# Patient Record
Sex: Female | Born: 1940
Health system: Southern US, Community
[De-identification: ages and names within clinical notes are randomized; demographics above are authoritative.]

## PROBLEM LIST (undated history)

## (undated) DIAGNOSIS — G4730 Sleep apnea, unspecified: Secondary | ICD-10-CM

## (undated) DIAGNOSIS — M8580 Other specified disorders of bone density and structure, unspecified site: Secondary | ICD-10-CM

## (undated) DIAGNOSIS — M5481 Occipital neuralgia: Secondary | ICD-10-CM

## (undated) DIAGNOSIS — I10 Essential (primary) hypertension: Secondary | ICD-10-CM

## (undated) DIAGNOSIS — M545 Low back pain, unspecified: Secondary | ICD-10-CM

## (undated) DIAGNOSIS — M797 Fibromyalgia: Secondary | ICD-10-CM

## (undated) DIAGNOSIS — J189 Pneumonia, unspecified organism: Secondary | ICD-10-CM

## (undated) DIAGNOSIS — K219 Gastro-esophageal reflux disease without esophagitis: Secondary | ICD-10-CM

## (undated) DIAGNOSIS — C801 Malignant (primary) neoplasm, unspecified: Secondary | ICD-10-CM

## (undated) DIAGNOSIS — A159 Respiratory tuberculosis unspecified: Secondary | ICD-10-CM

## (undated) DIAGNOSIS — D649 Anemia, unspecified: Secondary | ICD-10-CM

## (undated) DIAGNOSIS — E871 Hypo-osmolality and hyponatremia: Secondary | ICD-10-CM

## (undated) DIAGNOSIS — M1990 Unspecified osteoarthritis, unspecified site: Secondary | ICD-10-CM

## (undated) DIAGNOSIS — C4492 Squamous cell carcinoma of skin, unspecified: Secondary | ICD-10-CM

## (undated) DIAGNOSIS — K649 Unspecified hemorrhoids: Secondary | ICD-10-CM

## (undated) DIAGNOSIS — R011 Cardiac murmur, unspecified: Secondary | ICD-10-CM

## (undated) DIAGNOSIS — N189 Chronic kidney disease, unspecified: Secondary | ICD-10-CM

## (undated) DIAGNOSIS — G250 Essential tremor: Secondary | ICD-10-CM

## (undated) DIAGNOSIS — C4491 Basal cell carcinoma of skin, unspecified: Secondary | ICD-10-CM

## (undated) DIAGNOSIS — IMO0001 Reserved for inherently not codable concepts without codable children: Secondary | ICD-10-CM

## (undated) DIAGNOSIS — E7800 Pure hypercholesterolemia, unspecified: Secondary | ICD-10-CM

## (undated) HISTORY — DX: Unspecified osteoarthritis, unspecified site: M19.90

## (undated) HISTORY — DX: Low back pain, unspecified: M54.50

## (undated) HISTORY — DX: Unspecified hemorrhoids: K64.9

## (undated) HISTORY — DX: Occipital neuralgia: M54.81

## (undated) HISTORY — DX: Hypo-osmolality and hyponatremia: E87.1

## (undated) HISTORY — DX: Essential tremor: G25.0

## (undated) HISTORY — DX: Other specified disorders of bone density and structure, unspecified site: M85.80

## (undated) HISTORY — DX: Low back pain: M54.5

## (undated) HISTORY — DX: Gastro-esophageal reflux disease without esophagitis: K21.9

## (undated) HISTORY — DX: Essential (primary) hypertension: I10

## (undated) HISTORY — PX: TUBAL LIGATION: SHX77

## (undated) HISTORY — DX: Chronic kidney disease, unspecified: N18.9

## (undated) HISTORY — DX: Pure hypercholesterolemia, unspecified: E78.00

---
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
## 1982-08-17 DIAGNOSIS — C4491 Basal cell carcinoma of skin, unspecified: Secondary | ICD-10-CM

## 1982-08-17 HISTORY — DX: Basal cell carcinoma of skin, unspecified: C44.91

## 2000-10-22 ENCOUNTER — Encounter: Admission: RE | Admit: 2000-10-22 | Discharge: 2000-10-22 | Payer: Self-pay | Admitting: Internal Medicine

## 2000-10-22 ENCOUNTER — Encounter: Payer: Self-pay | Admitting: Internal Medicine

## 2001-09-19 ENCOUNTER — Ambulatory Visit (HOSPITAL_COMMUNITY): Admission: RE | Admit: 2001-09-19 | Discharge: 2001-09-19 | Payer: Self-pay | Admitting: Gastroenterology

## 2002-07-06 ENCOUNTER — Encounter: Payer: Self-pay | Admitting: Emergency Medicine

## 2002-07-06 ENCOUNTER — Inpatient Hospital Stay (HOSPITAL_COMMUNITY): Admission: EM | Admit: 2002-07-06 | Discharge: 2002-07-07 | Payer: Self-pay | Admitting: Emergency Medicine

## 2002-07-07 ENCOUNTER — Encounter: Payer: Self-pay | Admitting: Internal Medicine

## 2002-07-15 DIAGNOSIS — C4492 Squamous cell carcinoma of skin, unspecified: Secondary | ICD-10-CM

## 2002-07-15 HISTORY — DX: Squamous cell carcinoma of skin, unspecified: C44.92

## 2003-01-13 ENCOUNTER — Encounter: Admission: RE | Admit: 2003-01-13 | Discharge: 2003-01-13 | Payer: Self-pay | Admitting: Internal Medicine

## 2003-01-13 ENCOUNTER — Encounter: Payer: Self-pay | Admitting: Internal Medicine

## 2003-10-30 ENCOUNTER — Other Ambulatory Visit: Admission: RE | Admit: 2003-10-30 | Discharge: 2003-10-30 | Payer: Self-pay | Admitting: Internal Medicine

## 2003-11-06 ENCOUNTER — Ambulatory Visit (HOSPITAL_COMMUNITY): Admission: RE | Admit: 2003-11-06 | Discharge: 2003-11-06 | Payer: Self-pay | Admitting: Internal Medicine

## 2003-11-09 ENCOUNTER — Encounter: Admission: RE | Admit: 2003-11-09 | Discharge: 2003-11-09 | Payer: Self-pay | Admitting: Internal Medicine

## 2003-12-01 ENCOUNTER — Ambulatory Visit (HOSPITAL_COMMUNITY): Admission: RE | Admit: 2003-12-01 | Discharge: 2003-12-01 | Payer: Self-pay | Admitting: Internal Medicine

## 2003-12-08 ENCOUNTER — Encounter: Admission: RE | Admit: 2003-12-08 | Discharge: 2003-12-08 | Payer: Self-pay | Admitting: Internal Medicine

## 2004-07-20 ENCOUNTER — Encounter: Admission: RE | Admit: 2004-07-20 | Discharge: 2004-07-20 | Payer: Self-pay | Admitting: Gastroenterology

## 2004-07-29 ENCOUNTER — Ambulatory Visit (HOSPITAL_COMMUNITY): Admission: RE | Admit: 2004-07-29 | Discharge: 2004-07-29 | Payer: Self-pay | Admitting: Neurology

## 2004-11-04 ENCOUNTER — Other Ambulatory Visit: Admission: RE | Admit: 2004-11-04 | Discharge: 2004-11-04 | Payer: Self-pay | Admitting: Internal Medicine

## 2005-01-06 ENCOUNTER — Encounter: Admission: RE | Admit: 2005-01-06 | Discharge: 2005-01-06 | Payer: Self-pay | Admitting: Internal Medicine

## 2005-07-27 ENCOUNTER — Encounter: Admission: RE | Admit: 2005-07-27 | Discharge: 2005-07-27 | Payer: Self-pay | Admitting: Orthopedic Surgery

## 2005-09-01 ENCOUNTER — Emergency Department (HOSPITAL_COMMUNITY): Admission: EM | Admit: 2005-09-01 | Discharge: 2005-09-01 | Payer: Self-pay | Admitting: Family Medicine

## 2006-01-03 ENCOUNTER — Other Ambulatory Visit: Admission: RE | Admit: 2006-01-03 | Discharge: 2006-01-03 | Payer: Self-pay | Admitting: Family Medicine

## 2006-01-09 ENCOUNTER — Encounter: Admission: RE | Admit: 2006-01-09 | Discharge: 2006-01-09 | Payer: Self-pay | Admitting: Family Medicine

## 2006-01-23 ENCOUNTER — Encounter: Admission: RE | Admit: 2006-01-23 | Discharge: 2006-01-23 | Payer: Self-pay | Admitting: Family Medicine

## 2006-03-15 ENCOUNTER — Emergency Department (HOSPITAL_COMMUNITY): Admission: EM | Admit: 2006-03-15 | Discharge: 2006-03-15 | Payer: Self-pay | Admitting: Emergency Medicine

## 2006-03-27 DIAGNOSIS — C4491 Basal cell carcinoma of skin, unspecified: Secondary | ICD-10-CM

## 2006-03-27 HISTORY — DX: Basal cell carcinoma of skin, unspecified: C44.91

## 2006-05-08 ENCOUNTER — Encounter: Admission: RE | Admit: 2006-05-08 | Discharge: 2006-06-06 | Payer: Self-pay | Admitting: Rheumatology

## 2006-05-28 ENCOUNTER — Encounter: Admission: RE | Admit: 2006-05-28 | Discharge: 2006-05-28 | Payer: Self-pay | Admitting: Family Medicine

## 2007-02-26 ENCOUNTER — Encounter: Admission: RE | Admit: 2007-02-26 | Discharge: 2007-02-26 | Payer: Self-pay | Admitting: Family Medicine

## 2007-05-10 ENCOUNTER — Other Ambulatory Visit: Admission: RE | Admit: 2007-05-10 | Discharge: 2007-05-10 | Payer: Self-pay | Admitting: Family Medicine

## 2007-08-14 ENCOUNTER — Encounter: Admission: RE | Admit: 2007-08-14 | Discharge: 2007-08-14 | Payer: Self-pay | Admitting: Family Medicine

## 2007-09-03 ENCOUNTER — Encounter: Admission: RE | Admit: 2007-09-03 | Discharge: 2007-09-03 | Payer: Self-pay | Admitting: Family Medicine

## 2008-02-10 ENCOUNTER — Encounter: Admission: RE | Admit: 2008-02-10 | Discharge: 2008-02-10 | Payer: Self-pay | Admitting: Family Medicine

## 2008-08-17 ENCOUNTER — Encounter: Admission: RE | Admit: 2008-08-17 | Discharge: 2008-08-17 | Payer: Self-pay | Admitting: Family Medicine

## 2008-09-24 ENCOUNTER — Encounter: Admission: RE | Admit: 2008-09-24 | Discharge: 2008-09-24 | Payer: Self-pay | Admitting: Family Medicine

## 2009-12-09 ENCOUNTER — Encounter: Admission: RE | Admit: 2009-12-09 | Discharge: 2009-12-09 | Payer: Self-pay | Admitting: Family Medicine

## 2010-03-16 ENCOUNTER — Other Ambulatory Visit
Admission: RE | Admit: 2010-03-16 | Discharge: 2010-03-16 | Payer: Self-pay | Source: Home / Self Care | Admitting: Family Medicine

## 2010-07-30 ENCOUNTER — Encounter: Payer: Self-pay | Admitting: Neurology

## 2010-07-31 ENCOUNTER — Encounter: Payer: Self-pay | Admitting: Family Medicine

## 2010-07-31 ENCOUNTER — Encounter: Payer: Self-pay | Admitting: Internal Medicine

## 2010-11-25 NOTE — Consult Note (Signed)
NAME:  [name], [name] NO.:  [id number]   MEDICAL RECORD NO.:  [id number]                   PATIENT TYPE:  INP   LOCATION:  1829                                 FACILITY:  [facility]   PHYSICIAN:  Lesleigh Noe, M.D.            DATE OF BIRTH:  1940/10/07   DATE OF CONSULTATION:  07/06/2002  DATE OF DISCHARGE:                                   CONSULTATION   CONCLUSIONS:  1. Chest discomfort with atypical features.  Possibly responsive to     sublingual nitroglycerin.  Rule out myocardial infarction.  2. History of hypertension.  3. History of hyperlipidemia.   RECOMMENDATIONS:  1. Serial enzymes and EKGs to rule out MI.  2. If enzymes and EKGs remain negative, stress Cardiolite July 07, 2002.     If rules in, we will need coronary angiography.  3. Aspirin.  4. Lovenox subcutaneously.  5. Continue other therapy as previously listed.   HISTORY OF PRESENT ILLNESS:  The patient is 70 and has been previously  evaluated for coronary disease by Dr. Meade Maw, having had a stress  Cardiolite approximately two and a half to three years ago that was without  major abnormality.  At approximately 12 midnight, July 06, 2002, the  patient developed discomfort in her chest that would radiate from the  subclavicular region, down her chest in spasmodic episodes.  This eventually  lead to a pressure-like sensation in the chest.  She took up to three of her  husband's nitroglycerin tablets and over approximately an hour and a half  the discomfort became much improved.   This morning, when she awakened, she did not feel well.  There was no chest  discomfort but she came to the walk-in clinic for evaluation and was sent to  the ER.  She is currently pain-free.  She has a history of hyperlipidemia  and hypertension.   MEDICATIONS:  Lisinopril, hydrochlorothiazide, Pravachol, and a coated  aspirin per day.   HABITS:  Does not smoke or drink.   FAMILY HISTORY:  There is a positive family history of coronary artery  disease with both mother and father before age 71.   PHYSICAL EXAMINATION:  GENERAL:  The patient is in no acute distress.  VITAL SIGNS:  Blood pressure is 148/90, heart rate 80.  NECK:  No carotid bruits are heard.  No JVD is noted.  LUNGS:  Clear to auscultation and percussion.  CARDIOVASCULAR:  No click, no gallop, and no rub.  ABDOMEN:  Soft.  EXTREMITIES:  No edema.   LABORATORY DATA:  EKG reveals normal sinus rhythm, permanent P-waves  throughout.  No acute ST or T-wave changes or ischemia/infarction.   The laboratory data reveals a CK-MB of 103/1.5 and troponin I of 0.01.  Lesleigh Noe, M.D.    HWS/MEDQ  D:  07/06/2002  T:  07/06/2002  Job:  [id number]   cc:   Marcene Duos, M.D.  [address] [place]  [place] [address]  Fax: [phone]   Meade Maw, M.D.  [address]  [place]  [place] [address]  Fax: [phone]   Thora Lance, M.D.  [address]  [place]  [place] [address]  Fax: [phone]

## 2010-11-25 NOTE — H&P (Signed)
NAME:  [name], [name] NO.:  [id number]   MEDICAL RECORD NO.:  [id number]                   PATIENT TYPE:  INP   LOCATION:  1829                                 FACILITY:  [facility]   PHYSICIAN:  Thora Lance, M.D.               DATE OF BIRTH:  September 13, 1940   DATE OF ADMISSION:  07/06/2002  DATE OF DISCHARGE:                                HISTORY & PHYSICAL   CHIEF COMPLAINT:  Chest pain.   HISTORY OF PRESENT ILLNESS:  This is a 70 year old white female who awoke  this morning at 2:00 a.m. with chest pain.  Initially it was sharp and then  developed into a 10/10 chest pressure, which radiated into the left arm and  left jaw.  There was no shortness of breath.  She eventually took 2 of her  husband's nitroglycerin, and the pain completely went away.  Since that time  she has only had a flicker of pain.  She has had no recent chest pain in  the last several weeks.  She did have chest pain 3 years ago and had a  nuclear stress test done by Dr. Fraser Din, that she was told was borderline.  She has not had significant problems with her heart since that time.   Cardiac risk factors include a family history of CAD in both parents,  hypertension, and hypercholesterolemia.   PAST MEDICAL HISTORY:  1. Hypertension.  2. Hyperlipidemia.   PAST SURGICAL HISTORY:  Bilateral tubal ligation.   ALLERGIES:  None.   CURRENT MEDICATIONS:  1. Lisinopril 5 mg one p.o. every day.  2. Hydrochlorothiazide 25 mg one p.o. every day.  3. Pravachol 40 mg one p.o. q.h.s.  4. Multivitamin every day.  5. Caltrate one a day.  6. Relafen 500 mg a day.   FAMILY HISTORY:  Mother with CHF in her 52s, diabetes, and leukemia.  Father  died at age 67, prostate cancer, had CAD in his late 58s.  Sister is okay.   SOCIAL HISTORY:  Married.  Two children, one living.  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  She does  not smoke.  Occasional  alcohol.   REVIEW OF SYSTEMS:  Negative.   PHYSICAL EXAMINATION:  GENERAL:  Well-appearing, white female.  VITAL SIGNS:  Blood pressure 134/67, heart rate 74, respirations 16,  afebrile, oxygen saturation 99% on room air.  HEENT:  Pupils are equal and reacts to light.  Extraocular muscles are  intact.  Ears show tympanic membranes are clear.  Oropharynx is clear.  NECK:  Supple.  No lymphadenopathy.  No bruits.  LUNGS:  Clear.  HEART:  Regular rate and rhythm without murmur, gallop, or rub.  ABDOMEN:  Soft, nontender.  No masses or hepatosplenomegaly.  _______ No  edema.  NEUROLOGIC:  Nonfocal.   LABORATORIES:  CBC:  WBC 4.7, hemoglobin 12.1, platelet  count 242.  Chemistry:  Sodium of 130, potassium 2.9, chloride 94, bicarb 27, glucose  102, BUN 9, creatinine 0.8, calcium 9.7, total protein 6.5, albumin 3.8,  SGOT 29, SGPT 19, alkaline phosphatase 79, total bilirubin 1.7.  Creatinine  kinase is 103, creatinine kinase MB 1.5.  Troponin I of 0.01.  Chest x-ray:  No acute disease.  EKG:  Normal sinus rhythm, normal EKG.   ASSESSMENT:  1. Chest pain, rule out unstable angina pectoris or myocardial infarction.     She has 3 cardiac risk factors.  2. Hypertension.  3. Hyperlipidemia.   PLAN:  Admit to telemetry.  Aspirin.  Lovenox.  If any further chest pain,  start nitroglycerin IV.  Cardiology consult for possible stress Cardiolite  study.                                               Thora Lance, M.D.    Delorse Limber  D:  07/06/2002  T:  07/06/2002  Job:  [id number]   cc:   Marcene Duos, M.D.  [address] [place]  [place] [address]  Fax: [phone]

## 2011-01-09 ENCOUNTER — Other Ambulatory Visit: Payer: Self-pay | Admitting: Dermatology

## 2011-05-03 ENCOUNTER — Other Ambulatory Visit: Payer: Self-pay | Admitting: Dermatology

## 2012-02-23 ENCOUNTER — Other Ambulatory Visit: Payer: Self-pay | Admitting: Family Medicine

## 2012-02-23 DIAGNOSIS — Z1231 Encounter for screening mammogram for malignant neoplasm of breast: Secondary | ICD-10-CM

## 2012-02-29 ENCOUNTER — Ambulatory Visit
Admission: RE | Admit: 2012-02-29 | Discharge: 2012-02-29 | Disposition: A | Discharge status: Home / Self Care | Payer: Medicare Other | Source: Ambulatory Visit | Attending: Family Medicine | Admitting: Family Medicine

## 2012-02-29 DIAGNOSIS — Z1231 Encounter for screening mammogram for malignant neoplasm of breast: Secondary | ICD-10-CM

## 2012-10-30 ENCOUNTER — Other Ambulatory Visit: Payer: Self-pay

## 2012-10-30 MED ORDER — PREGABALIN 75 MG PO CAPS: 75.0000 mg | ORAL_CAPSULE | Freq: Two times a day (BID) | ORAL | Status: DC

## 2012-10-30 NOTE — Telephone Encounter (Signed)
Former Dr [name] Patient, has not been assigned new provider.  Forwarding request to [facility].

## 2012-12-09 ENCOUNTER — Inpatient Hospital Stay (HOSPITAL_COMMUNITY): Admission: RE | Admit: 2012-12-09 | Payer: Medicare Other | Source: Ambulatory Visit | Admitting: Orthopedic Surgery

## 2012-12-09 ENCOUNTER — Encounter (HOSPITAL_COMMUNITY): Admission: RE | Payer: Self-pay | Source: Ambulatory Visit

## 2012-12-09 SURGERY — ARTHROPLASTY, KNEE, TOTAL
Anesthesia: Regional | Site: Knee | Laterality: Left

## 2013-04-03 ENCOUNTER — Emergency Department (HOSPITAL_COMMUNITY)
Admission: EM | Admit: 2013-04-03 | Discharge: 2013-04-03 | Disposition: A | Discharge status: Home / Self Care | Payer: Medicare Other | Attending: Emergency Medicine | Admitting: Emergency Medicine

## 2013-04-03 ENCOUNTER — Emergency Department (HOSPITAL_COMMUNITY): Payer: Medicare Other

## 2013-04-03 ENCOUNTER — Encounter (HOSPITAL_COMMUNITY): Payer: Self-pay | Admitting: *Deleted

## 2013-04-03 DIAGNOSIS — Y929 Unspecified place or not applicable: Secondary | ICD-10-CM | POA: Insufficient documentation

## 2013-04-03 DIAGNOSIS — Z79899 Other long term (current) drug therapy: Secondary | ICD-10-CM | POA: Insufficient documentation

## 2013-04-03 DIAGNOSIS — Y9301 Activity, walking, marching and hiking: Secondary | ICD-10-CM | POA: Insufficient documentation

## 2013-04-03 DIAGNOSIS — IMO0001 Reserved for inherently not codable concepts without codable children: Secondary | ICD-10-CM | POA: Insufficient documentation

## 2013-04-03 DIAGNOSIS — W19XXXA Unspecified fall, initial encounter: Secondary | ICD-10-CM

## 2013-04-03 DIAGNOSIS — S0003XA Contusion of scalp, initial encounter: Secondary | ICD-10-CM | POA: Insufficient documentation

## 2013-04-03 DIAGNOSIS — W1809XA Striking against other object with subsequent fall, initial encounter: Secondary | ICD-10-CM | POA: Insufficient documentation

## 2013-04-03 DIAGNOSIS — I10 Essential (primary) hypertension: Secondary | ICD-10-CM | POA: Insufficient documentation

## 2013-04-03 HISTORY — DX: Fibromyalgia: M79.7

## 2013-04-03 MED ORDER — IBUPROFEN 400 MG PO TABS
600.0000 mg | ORAL_TABLET | Freq: Once | ORAL | Status: DC
  Filled 2013-04-03 (×2): qty 1

## 2013-04-03 NOTE — ED Provider Notes (Addendum)
I saw and evaluated the patient, reviewed the resident's note and I agree with the findings and plan.  Patient seen by me. Patient was walking her dog and got pulled and fell backwards. Your head on the cement no loss of consciousness. Patient has small hematoma to the back of the head. Patient is not on any blood thinners. Patient will get head CT if negative she can be discharged home. Patient's neuro exam is normal mental status is normal.    Shelda Jakes, MD 04/03/13 2007  Shelda Jakes, MD 04/03/13 2007

## 2013-04-03 NOTE — ED Provider Notes (Signed)
CSN: [id number]     Arrival date & time 04/03/13  1855 History   First MD Initiated Contact with Patient 04/03/13 1930     Chief Complaint  Patient presents with  . Fall   (Consider location/radiation/quality/duration/timing/severity/associated sxs/prior Treatment) Patient is a 72 y.o. female presenting with fall. The history is provided by the patient. No language interpreter was used.  Fall This is a new problem. The current episode started today. Episode frequency: once. The problem has been resolved. Associated symptoms include headaches. Pertinent negatives include no abdominal pain, arthralgias, chest pain, fever, joint swelling, nausea, neck pain, numbness, vertigo, visual change, vomiting or weakness. Exacerbated by: palpation of hematoma. She has tried nothing for the symptoms.    Past Medical History  Diagnosis Date  . Hypertension   . Fibromyalgia    History reviewed. No pertinent past surgical history. History reviewed. No pertinent family history. History  Substance Use Topics  . Smoking status: Never Smoker   . Smokeless tobacco: Not on file  . Alcohol Use: Yes     Comment: 1 drink daily   OB History   Grav Para Term Preterm Abortions TAB SAB Ect Mult Living                 Review of Systems  Constitutional: Negative for fever.  HENT: Negative for neck pain.   Respiratory: Negative for shortness of breath.   Cardiovascular: Negative for chest pain.  Gastrointestinal: Negative for nausea, vomiting and abdominal pain.  Musculoskeletal: Negative for back pain, joint swelling, arthralgias and gait problem.  Skin: Positive for wound.  Neurological: Positive for headaches. Negative for dizziness, vertigo, seizures, syncope, speech difficulty, weakness and numbness.  All other systems reviewed and are negative.    Allergies  Lisinopril and Sulfa antibiotics  Home Medications   Current Outpatient Rx  Name  Route  Sig  Dispense  Refill  . amLODipine (NORVASC)  5 MG tablet   Oral   Take 5 mg by mouth daily.         Marland Kitchen atenolol (TENORMIN) 25 MG tablet   Oral   Take 25 mg by mouth daily.         . celecoxib (CELEBREX) 200 MG capsule   Oral   Take 200 mg by mouth 2 (two) times daily as needed for pain.         Marland Kitchen gabapentin (NEURONTIN) 100 MG capsule   Oral   Take 100 mg by mouth 3 (three) times daily as needed (pain).         Marland Kitchen lidocaine (LIDODERM) 5 %   Transdermal   Place 1 patch onto the skin daily as needed (pain). Remove & Discard patch within 12 hours or as directed by MD         . pregabalin (LYRICA) 75 MG capsule   Oral   Take 1 capsule (75 mg total) by mouth 2 (two) times daily.   60 capsule   5     Pharmacy Fax [phone]   . SALINE NA   Each Nare   Place 1 spray into both nostrils as needed (dryness).         . Tetrahydrozoline HCl (VISINE OP)   Both Eyes   Place 1 drop into both eyes as needed (dry eyes).         . traMADol (ULTRAM) 50 MG tablet   Oral   Take 100 mg by mouth 3 (three) times daily as needed for pain.  BP 143/59  Pulse 73  Temp(Src) 97.3 F (36.3 C) (Oral)  Resp 18  Ht 5\' 5"  (1.651 m)  Wt 150 lb (68.04 kg)  BMI 24.96 kg/m2  SpO2 98% Physical Exam  Vitals reviewed. Constitutional: She is oriented to person, place, and time. She appears well-developed and well-nourished.  HENT:  Head:    Mouth/Throat: Oropharynx is clear and moist.  Eyes: EOM are normal. Pupils are equal, round, and reactive to light.  Cardiovascular: Normal rate, regular rhythm, normal heart sounds and intact distal pulses.   Pulmonary/Chest: Effort normal and breath sounds normal.  Abdominal: Soft. Bowel sounds are normal. She exhibits no distension. There is no tenderness.  Musculoskeletal:       Cervical back: She exhibits no bony tenderness.       Thoracic back: She exhibits no bony tenderness.       Lumbar back: She exhibits no bony tenderness.  Neurological: She is alert and oriented to  person, place, and time. She has normal strength. No cranial nerve deficit or sensory deficit. Coordination and gait normal. GCS eye subscore is 4. GCS verbal subscore is 5. GCS motor subscore is 6.  Skin: Skin is warm and dry.    ED Course  Procedures (including critical care time) Labs Review Labs Reviewed - No data to display Imaging Review Ct Head Wo Contrast  04/03/2013   CLINICAL DATA:  Head injury status post fall.  EXAM: CT HEAD WITHOUT CONTRAST  TECHNIQUE: Contiguous axial images were obtained from the base of the skull through the vertex without intravenous contrast.  COMPARISON:  Head CT 03/15/2006.  FINDINGS: There is a large posterior scalp hematoma. There is no evidence of acute intracranial hemorrhage, mass lesion, brain edema or extra-axial fluid collection. The ventricles and subarachnoid spaces are appropriately sized for age. There is no CT evidence of acute cortical infarction. There is minimal periventricular white matter disease.  The visualized paranasal sinuses, mastoid air cells and middle ears are clear. The calvarium is intact.  IMPRESSION: Large posterior scalp soft tissue hematoma. No acute intracranial findings or evidence of calvarial fracture.   Electronically Signed   By: Roxy Horseman   On: 04/03/2013 20:46    MDM   1. Fall, initial encounter   2. Hematoma of scalp, initial encounter     72 y/o female with mechanical fall. Posterior scalp hematoma and abrasion. No neurological deficits on exam. CT head without intracranial hematoma/hemorrhage. Cervical spine cleared clinically by Nexus criteria. Appropriate for discharge with PCP follow up. Return precautions discussed (focal neurological deficits, worsening headache, vision changes).   Imaging reivewed in my medical decision making. Patient discussed with my attending, Dr. Nonda Lou, MD 04/04/13 [phone]

## 2013-04-03 NOTE — ED Notes (Signed)
Patient was walking her dog and the dog tugged away, the patient fell backwards and hit her head, small abrasion on her head with some swelling

## 2013-04-03 NOTE — ED Notes (Signed)
Pt reports walking her dog and falling backwards, hit back of head on cement, no loc. Has hematoma to back of head.

## 2013-05-05 ENCOUNTER — Other Ambulatory Visit: Payer: Self-pay

## 2013-05-05 DIAGNOSIS — Z1231 Encounter for screening mammogram for malignant neoplasm of breast: Secondary | ICD-10-CM

## 2013-05-27 ENCOUNTER — Ambulatory Visit
Admission: RE | Admit: 2013-05-27 | Discharge: 2013-05-27 | Disposition: A | Discharge status: Home / Self Care | Payer: Medicare Other | Source: Ambulatory Visit

## 2013-05-27 DIAGNOSIS — Z1231 Encounter for screening mammogram for malignant neoplasm of breast: Secondary | ICD-10-CM

## 2013-06-18 ENCOUNTER — Other Ambulatory Visit: Payer: Self-pay | Admitting: Family Medicine

## 2013-06-18 DIAGNOSIS — R928 Other abnormal and inconclusive findings on diagnostic imaging of breast: Secondary | ICD-10-CM

## 2013-06-25 ENCOUNTER — Ambulatory Visit
Admission: RE | Admit: 2013-06-25 | Discharge: 2013-06-25 | Disposition: A | Discharge status: Home / Self Care | Payer: Medicare Other | Source: Ambulatory Visit | Attending: Family Medicine | Admitting: Family Medicine

## 2013-06-25 DIAGNOSIS — R928 Other abnormal and inconclusive findings on diagnostic imaging of breast: Secondary | ICD-10-CM

## 2013-08-14 ENCOUNTER — Other Ambulatory Visit: Payer: Self-pay | Admitting: Interventional Cardiology

## 2013-09-08 ENCOUNTER — Encounter: Payer: Self-pay | Admitting: Interventional Cardiology

## 2013-10-14 ENCOUNTER — Ambulatory Visit: Payer: Medicare Other | Admitting: Interventional Cardiology

## 2013-10-20 ENCOUNTER — Encounter: Payer: Self-pay | Admitting: Podiatry

## 2013-10-20 ENCOUNTER — Ambulatory Visit (INDEPENDENT_AMBULATORY_CARE_PROVIDER_SITE_OTHER): Payer: Commercial Managed Care - HMO | Admitting: Podiatry

## 2013-10-20 DIAGNOSIS — M79609 Pain in unspecified limb: Secondary | ICD-10-CM

## 2013-10-20 NOTE — Progress Notes (Signed)
   Subjective:    Patient ID: [name], female    DOB: 1940-12-28, 73 y.o.   MRN: [id number]  HPI PT STATED RT OUTSIDE THE FOOT HAVE A KNOT AND IS BEEN SORE FOR OVER 10 YEARS. THE FOOT IS GETTING WORSE. THE FOOT GET AGGRAVATED WHEN PUTTING PRESSURE ON IT. TRIED TO SOAK IT WITH EPSON SALT AND DID NOT HELP AT ALL. ALSO CHECK B/L TOENAILS ARE CURVING IN.     DR. Amalia Hailey CANNOT ABLE TO SEE THE PT  WAS 50 MINUTES LATE AND RESCHEDULE THE APPOINTMENT.   Review of Systems  Musculoskeletal: Positive for joint swelling and myalgias.  Neurological: Positive for tremors.  Hematological: Bruises/bleeds easily.  All other systems reviewed and are negative.      Objective:   Physical Exam        Assessment & Plan:  Patient was not evaluated by myself today and was rescheduled because she was at least 50 minutes late for her scheduled appointment. She was offered the option of waiting or rescheduling.

## 2013-10-25 ENCOUNTER — Other Ambulatory Visit: Payer: Self-pay | Admitting: Interventional Cardiology

## 2013-12-16 ENCOUNTER — Encounter: Payer: Self-pay | Admitting: Cardiology

## 2013-12-16 ENCOUNTER — Encounter: Payer: Self-pay | Admitting: Interventional Cardiology

## 2013-12-16 ENCOUNTER — Ambulatory Visit (INDEPENDENT_AMBULATORY_CARE_PROVIDER_SITE_OTHER): Payer: Commercial Managed Care - HMO | Admitting: Interventional Cardiology

## 2013-12-16 VITALS — BP 122/66 | HR 59 | Ht 65.0 in | Wt 155.2 lb

## 2013-12-16 DIAGNOSIS — I10 Essential (primary) hypertension: Secondary | ICD-10-CM

## 2013-12-16 DIAGNOSIS — E7800 Pure hypercholesterolemia, unspecified: Secondary | ICD-10-CM

## 2013-12-16 DIAGNOSIS — R0602 Shortness of breath: Secondary | ICD-10-CM

## 2013-12-16 HISTORY — DX: Pure hypercholesterolemia, unspecified: E78.00

## 2013-12-16 NOTE — Patient Instructions (Signed)
Your physician has requested that you have en exercise stress myoview. For further information please visit [email or web address]. Please follow instruction sheet, as given.  Your physician recommends that you continue on your current medications as directed. Please refer to the Current Medication list given to you today.  Your physician wants you to follow-up in: 1 year with Dr. Varanasi. You will receive a reminder letter in the mail two months in advance. If you don't receive a letter, please call our office to schedule the follow-up appointment.  

## 2013-12-16 NOTE — Progress Notes (Signed)
Patient ID: [name], female   DOB: 04/29/1941, 73 y.o.   MRN: [id number]    [place], [place] [address], [place]  [address] Phone: [phone] Fax:  [phone]  Date:  12/16/2013   ID:  [name], [name] [birth year], MRN [id number]  PCP:  Osborne Casco, MD      History of Present Illness: [name] is a 73 y.o. female with HTN and HLD. BPs is well controlled at home.  No CP. She has some SHOB when she exerts herself. She is in a program at [facility] for patients with fibromyalgia. She does some exercises there and does some stretching exercises at home. She is not confident walking her dog. She feels arthritis has gotten worse.   She was unable to tolerate HCTZ due to hyponatremia. She switched her ACE-I to amlodipine and her sodium improved.   Reports SHOB,  diaphoresis of back of head with exertion along with sharp, brief, chest pain occasionally. Her feet also turned beet red. Has to stop activity to feel better. This has been going on for the past 6-7 mos,at least every other day. Also reports dizziness somemtimes. Still has LE swelling at night. Uses Lasix very sparingly Denies palpitations, orthopnea, PND.  Exercises 3 days a week.    Wt Readings from Last 3 Encounters:  12/16/13 155 lb 3.2 oz (70.398 kg)  04/03/13 150 lb (68.04 kg)     Past Medical History  Diagnosis Date  . Essential hypertension, benign   . Fibromyalgia   . Pure hypercholesterolemia 12/16/2013  . Osteoarthritis   . Low back pain     facet joint pain  . Benign essential tremor   . Hyponatremia     secondary to hctz-recurrent March 2010  . Hemorrhoids   . Osteopenia   . GERD (gastroesophageal reflux disease)   . Occipital neuralgia     Current Outpatient Prescriptions  Medication Sig Dispense Refill  . amLODipine (NORVASC) 5 MG tablet TAKE ONE TABLET BY MOUTH DAILY  90 tablet  0  . atenolol (TENORMIN) 25 MG tablet TAKE ONE TABLET BY MOUTH DAILY  90 tablet  0  . celecoxib  (CELEBREX) 200 MG capsule Take 200 mg by mouth every other day.       . furosemide (LASIX) 40 MG tablet Take 40 mg by mouth as needed.       . gabapentin (NEURONTIN) 100 MG capsule Take 100 mg by mouth 3 (three) times daily as needed (pain).      Marland Kitchen HYALGAN 20 MG/2ML SOSY       . lidocaine (LIDODERM) 5 % Place 1 patch onto the skin daily as needed (pain). Remove & Discard patch within 12 hours or as directed by MD      . pregabalin (LYRICA) 75 MG capsule Take 1 capsule (75 mg total) by mouth 2 (two) times daily.  60 capsule  5  . SALINE NA Place 1 spray into both nostrils as needed (dryness).      . Tetrahydrozoline HCl (VISINE OP) Place 1 drop into both eyes as needed (dry eyes).      . traMADol (ULTRAM) 50 MG tablet Take 100 mg by mouth 3 (three) times daily as needed for pain.      Marland Kitchen zolpidem (AMBIEN) 10 MG tablet Take 10 mg by mouth at bedtime.        No current facility-administered medications for this visit.    Allergies:    Allergies  Allergen  Reactions  . Hctz [Hydrochlorothiazide]     Hyponatremia   . Lisinopril Other (See Comments)    Sodium plumits  . Sulfa Antibiotics Rash    Social History:  The patient  reports that she has never smoked. She does not have any smokeless tobacco history on file. She reports that she drinks alcohol. She reports that she does not use illicit drugs.   Family History:  The patient's family history includes Heart disease in her father.   ROS:  Please see the history of present illness.  No nausea, vomiting.  No fevers, chills.  No focal weakness.  No dysuria.    All other systems reviewed and negative.   PHYSICAL EXAM: VS:  BP 122/66  Pulse 59  Ht 5\' 5"  (1.651 m)  Wt 155 lb 3.2 oz (70.398 kg)  BMI 25.83 kg/m2 Well nourished, well developed, in no acute distress HEENT: normal Neck: no JVD, no carotid bruits Cardiac:  normal S1, S2; RRR;  Lungs:  clear to auscultation bilaterally, no wheezing, rhonchi or rales Abd: soft, nontender, no  hepatomegaly Ext: no edema Skin: warm and dry Neuro:   no focal abnormalities noted  EKG:  Sinus bradycardia, no ST segment changes     ASSESSMENT AND PLAN:  Pre-operative cardiovascular examination  IMAGING: EKG    Harward,Amy 10/14/2012 11:02:32 AM > Lyssa Hackley,JAY 10/14/2012 11:22:43 AM > normal   Notes: Needs knee replacement.  Plan stress test.  She can try the treadmill but will likely need a Lexus scan study if she cannot get her heart rate up. We'll also check this given her recent shortness of breath and diaphoresis that she feels is out of proportion to her degree of exercise.    2. Essential hypertension, benign  Increase Amlodipine Besylate Tablet, 5 MG, TAKE ONE TABLET BY MOUTH DAILY, 90 days, 90, Refills 3 Refill Atenolol Tablet, 25 MG, 1 tablet, Orally, Once a day, 90 days, 90, Refills 3  now better controlled.    3. Ankle edema   Lasix as needed    4. Pure hypercholesterolemia  Start Simvastatin Tablet, 40 MG, 1 tablet every evening, Orally, Once a day Notes: She had stopped this due to joint pains. No change in 6 months since being off of the simvastatin.  I would recommend that she restart the simvastatin. LDL was controlled in 2014. She expects to have a higher reading this year since she has been off of the statin. She is willing to restart the statin.         Signed, Mina Marble, MD, [facility] 12/16/2013 12:15 PM

## 2013-12-26 ENCOUNTER — Telehealth: Payer: Self-pay | Admitting: Interventional Cardiology

## 2013-12-26 NOTE — Telephone Encounter (Signed)
Maximum HR will be 220-74 = 146. Target HR will be 124. These numbers are for exercise stress only, Not Lexiscan I think she will do fine with stress testing

## 2013-12-26 NOTE — Telephone Encounter (Signed)
I spoke with pt & she is aware Horton Chin RN

## 2013-12-26 NOTE — Telephone Encounter (Signed)
New Message:  Pt is requesting a call back in reference to her stress test. States she has several questions. Wants to speak with a nurse

## 2013-12-26 NOTE — Telephone Encounter (Signed)
Pt calling to make sure her stress test is here at the [facility] office & not as the automated reminder phoned her & said to report to [facility]. Office.  Pt is aware she will come to the [facility] on 12/30/13 for her stress test. Horton Chin RN

## 2013-12-26 NOTE — Telephone Encounter (Signed)
I spoke with pt again & she is very concerned that there will not be a doctor in the room while she is undergoing her stress test. She is fully aware we have cardiologists in the office while she is having this test. Have offered her a later date appt when Dr. Irish Lack is present in the office. She has declined at this time   I have given her information from her previous stress test in 2009 & have tried to reassure pt.  Pt would like to know from Dr. Irish Lack what her target heart rate will be? & what would be an unsafe heart rate. pt would like [place]  call back with this information   Forwarded to Dr. Lenon Curt RN

## 2013-12-30 ENCOUNTER — Ambulatory Visit (HOSPITAL_COMMUNITY): Payer: Medicare HMO | Attending: Cardiology | Admitting: Radiology

## 2013-12-30 VITALS — BP 151/81 | HR 66 | Ht 65.0 in | Wt 153.0 lb

## 2013-12-30 DIAGNOSIS — R079 Chest pain, unspecified: Secondary | ICD-10-CM

## 2013-12-30 DIAGNOSIS — I10 Essential (primary) hypertension: Secondary | ICD-10-CM | POA: Insufficient documentation

## 2013-12-30 DIAGNOSIS — Z8249 Family history of ischemic heart disease and other diseases of the circulatory system: Secondary | ICD-10-CM | POA: Insufficient documentation

## 2013-12-30 DIAGNOSIS — R0989 Other specified symptoms and signs involving the circulatory and respiratory systems: Secondary | ICD-10-CM | POA: Insufficient documentation

## 2013-12-30 DIAGNOSIS — R42 Dizziness and giddiness: Secondary | ICD-10-CM | POA: Insufficient documentation

## 2013-12-30 DIAGNOSIS — R0602 Shortness of breath: Secondary | ICD-10-CM

## 2013-12-30 DIAGNOSIS — R0789 Other chest pain: Secondary | ICD-10-CM | POA: Insufficient documentation

## 2013-12-30 DIAGNOSIS — R0609 Other forms of dyspnea: Secondary | ICD-10-CM | POA: Insufficient documentation

## 2013-12-30 MED ORDER — TECHNETIUM TC 99M SESTAMIBI GENERIC - CARDIOLITE
10.0000 | Freq: Once | INTRAVENOUS | Status: AC | PRN
  Administered 2013-12-30: 10 via INTRAVENOUS

## 2013-12-30 MED ORDER — TECHNETIUM TC 99M SESTAMIBI GENERIC - CARDIOLITE
30.0000 | Freq: Once | INTRAVENOUS | Status: AC | PRN
  Administered 2013-12-30: 30 via INTRAVENOUS

## 2013-12-30 NOTE — Progress Notes (Signed)
[facility] [address] [place], [place] [address] [phone]    Cardiology Nuclear Med Study  [name] is a 73 y.o. female     MRN : [id number]     DOB: [birth year]  Procedure Date: 12/30/2013  Nuclear Med Background Indication for Stress Test:  Evaluation for Ischemia and Surgical Clearance: Possible knee surgery History:  No known CAD, Echo 2011 EF 60-65%, MPI 2009 (normal) EF 80% Cardiac Risk Factors: Family History - CAD, Hypertension and Lipids  Symptoms:  Chest Pain (last date of chest discomfort was last week), Dizziness and DOE   Nuclear Pre-Procedure Caffeine/Decaff Intake:  7:00pm NPO After: 7:00pm   Lungs:  clear O2 Sat: 98% on room air. IV 0.9% NS with Angio Cath:  22g  IV Site: R Hand  IV Started by:  Matilde Haymaker, RN  Chest Size (in):  36 Cup Size: C  Height: 5\' 5"  (1.651 m)  Weight:  153 lb (69.4 kg)  BMI:  Body mass index is 25.46 kg/(m^2). Tech Comments:  No Atenolol x 36 hrs    Nuclear Med Study 1 or 2 day study: 1 day  Stress Test Type:  Stress  Reading MD: n/a  Order Authorizing Provider:  Rowan Blase  Resting Radionuclide: Technetium 79m Sestamibi  Resting Radionuclide Dose: 10.8 mCi   Stress Radionuclide:  Technetium 93m Sestamibi  Stress Radionuclide Dose: 33.0 mCi           Stress Protocol Rest HR: 66 Stress HR: 141  Rest BP: 151/81 Stress BP: 161/37  Exercise Time (min): 3:30 METS: 4.6           Dose of Adenosine (mg):  n/a Dose of Lexiscan: n/a mg  Dose of Atropine (mg): n/a Dose of Dobutamine: n/a mcg/kg/min (at max HR)  Stress Test Technologist: Glade Lloyd, BS-ES  Nuclear Technologist:  Charlton Amor, CNMT     Rest Procedure:  Myocardial perfusion imaging was performed at rest 45 minutes following the intravenous administration of Technetium 58m Sestamibi. Rest ECG: NSR - Normal EKG  Stress Procedure:  The patient exercised on the treadmill utilizing the Bruce Protocol for 3:30  minutes. The patient stopped due to being very SOB and denied any chest pain.  Technetium 17m Sestamibi was injected at peak exercise and myocardial perfusion imaging was performed after a brief delay. Stress ECG: No significant change from baseline ECG  QPS Raw Data Images:  Normal; no motion artifact; normal heart/lung ratio. Stress Images:  Normal homogeneous uptake in all areas of the myocardium. Rest Images:  Normal homogeneous uptake in all areas of the myocardium. Subtraction (SDS):  Normal Transient Ischemic Dilatation (Normal <1.22):  1.07 Lung/Heart Ratio (Normal <0.45):  0.31  Quantitative Gated Spect Images QGS EDV:  49 ml QGS ESV:  11 ml  Impression Exercise Capacity:  Poor exercise capacity. BP Response:  Normal blood pressure response. Clinical Symptoms:  There is dyspnea. ECG Impression:  No significant ST segment change suggestive of ischemia. Comparison with Prior Nuclear Study: No images to compare  Overall Impression:  Normal stress nuclear study. Note patient achieved less than 5 METS with exercise   LV Ejection Fraction: 77%.  LV Wall Motion:  NL LV Function; NL Wall Motion   Jenkins Rouge

## 2014-01-05 ENCOUNTER — Other Ambulatory Visit: Payer: Self-pay | Admitting: Dermatology

## 2014-04-02 ENCOUNTER — Other Ambulatory Visit: Payer: Self-pay | Admitting: Interventional Cardiology

## 2014-06-22 ENCOUNTER — Ambulatory Visit
Admission: RE | Admit: 2014-06-22 | Discharge: 2014-06-22 | Disposition: A | Discharge status: Home / Self Care | Payer: Commercial Managed Care - HMO | Source: Ambulatory Visit | Attending: Family Medicine | Admitting: Family Medicine

## 2014-06-22 ENCOUNTER — Other Ambulatory Visit: Payer: Self-pay | Admitting: Family Medicine

## 2014-06-22 DIAGNOSIS — R0989 Other specified symptoms and signs involving the circulatory and respiratory systems: Secondary | ICD-10-CM

## 2014-07-17 ENCOUNTER — Telehealth: Payer: Self-pay

## 2014-07-17 NOTE — Telephone Encounter (Signed)
Not sure. I would check with the patient and their pharmacy.

## 2014-07-20 ENCOUNTER — Other Ambulatory Visit: Payer: Self-pay

## 2014-07-20 MED ORDER — SIMVASTATIN 40 MG PO TABS: 40.0000 mg | ORAL_TABLET | Freq: Every day | ORAL | Status: DC

## 2014-07-20 NOTE — Telephone Encounter (Signed)
LMTCO.

## 2014-09-24 ENCOUNTER — Other Ambulatory Visit: Payer: Self-pay

## 2014-09-24 DIAGNOSIS — Z1239 Encounter for other screening for malignant neoplasm of breast: Secondary | ICD-10-CM

## 2014-09-30 ENCOUNTER — Other Ambulatory Visit: Payer: Self-pay | Admitting: Dermatology

## 2014-10-06 ENCOUNTER — Ambulatory Visit
Admission: RE | Admit: 2014-10-06 | Discharge: 2014-10-06 | Disposition: A | Discharge status: Home / Self Care | Payer: PPO | Source: Ambulatory Visit

## 2014-10-06 DIAGNOSIS — Z1239 Encounter for other screening for malignant neoplasm of breast: Secondary | ICD-10-CM

## 2014-10-29 ENCOUNTER — Other Ambulatory Visit: Payer: Self-pay | Admitting: Physician Assistant

## 2014-11-06 ENCOUNTER — Other Ambulatory Visit: Payer: Self-pay | Admitting: Interventional Cardiology

## 2015-01-01 ENCOUNTER — Encounter: Payer: Self-pay | Admitting: Neurology

## 2015-01-01 ENCOUNTER — Ambulatory Visit (INDEPENDENT_AMBULATORY_CARE_PROVIDER_SITE_OTHER): Payer: PPO | Admitting: Neurology

## 2015-01-01 VITALS — BP 110/58 | HR 72 | Resp 16 | Ht 65.0 in | Wt 150.0 lb

## 2015-01-01 DIAGNOSIS — G250 Essential tremor: Secondary | ICD-10-CM | POA: Diagnosis not present

## 2015-01-01 NOTE — Progress Notes (Signed)
Subjective:    Krause ID: [name] is a 74 y.o. female.  HPI      Interim history:   [name] is a 74 year old right-handed woman with an underlying medical history of fibromyalgia, hypertension, hyperlipidemia, atrial fibrillation, and overweight state, who presents for follow-up consultation of [name] tremor. [name] is unaccompanied today. This is [name] first visit with me and [name] previously saw Dr. Morene Antu. [name] was seen by him in December 2013 and I reviewed his office note from 06/17/2012: He reported that [name] has a history of low back pain and mild degenerative joint disease at L5-S1. He started seeing [name] in 2006 for right-sided mid face pain, suspected trigeminal neuralgia with normal exam and normal brain MRI and MRA in 2007. In 2009, he suspected [name] had fibromyalgia and tried [name] on Savella. [name] had SE, including difficulty sleeping at night. [name] has a history of chronic low sodium according to his note. [name] had noted difficulty with [name] handwriting and hand tremors. In Briana past, [name] has been on amitriptyline, Cymbalta and Savella for fibromyalgia. [name] mother had tremors. Overall, he felt that [name] was doing well. He suspected that [name] could have occipital neuralgia. [name] was on Lyrica and gabapentin at Briana time.  Today, 01/01/2015: [name] reports that [name] tremors have gradually become worse. [name] feels it more on [name] right side. [name] feels at times embarrassed to eat in public. [name] currently takes Lyrica 75 mg in Briana morning and 75 mg at midday and 100 mg of gabapentin at night. [name] does is primarily because Lyrica is too expensive even in Briana generic form. [name] takes tramadol 50 mg strength one in Briana morning, 2 at 3 PM and one or 2 at 7 or 8 PM. [name] also takes Lidoderm patch as needed. [name] is on amlodipine and Tenormin. [name] takes Zocor daily. [name] has to take Ambien 10 mg each night. [name] says [name] does not sleep without it. [name] is a nonsmoker. [name] does not drink caffeine. [name] drinks  alcohol daily, 2 cocktails each night. [name] tried to taper off of Lyrica in Briana past. [name] felt [name] fibromyalgia became worse. [name] has not tried Mysoline for tremors. [name] sister does not have a tremor.  [name] Past Medical History Is Significant For: Past Medical History  Diagnosis Date  . Essential hypertension, benign   . Fibromyalgia   . Pure hypercholesterolemia 12/16/2013  . Osteoarthritis   . Low back pain     facet joint pain  . Benign essential tremor   . Hyponatremia     secondary to hctz-recurrent March 2010  . Hemorrhoids   . Osteopenia   . GERD (gastroesophageal reflux disease)   . Occipital neuralgia   . Chronic kidney disease     [name] Past Surgical History Is Significant For: Past Surgical History  Procedure Laterality Date  . Tubal ligation      [name] Family History Is Significant For: Family History  Problem Relation Age of Onset  . Heart disease Father     [name] Social History Is Significant For: History   Social History  . Marital Status: Divorced    Spouse Name: N/A  . Number of Children: 2  . Years of Education: Masters   Occupational History  . Retired    Social History Main Topics  . Smoking status: Never Smoker   . Smokeless tobacco: Not on file  . Alcohol Use: 0.0 oz/week    0 Standard drinks or equivalent  per week     Comment: 1 drink daily  . Drug Use: No  . Sexual Activity: Not on file   Other Topics Concern  . None   Social History Narrative   Denies caffeine use     [name] Allergies Are:  Allergies  Allergen Reactions  . Hctz [Hydrochlorothiazide]     Hyponatremia   . Lisinopril Other (See Comments)    Sodium plumits  . Sulfa Antibiotics Rash  :   [name] Current Medications Are:  Outpatient Encounter Prescriptions as of 01/01/2015  Medication Sig  . amLODipine (NORVASC) 5 MG tablet TAKE ONE TABLET BY MOUTH DAILY  . atenolol (TENORMIN) 25 MG tablet TAKE ONE TABLET BY MOUTH DAILY  . gabapentin (NEURONTIN) 100 MG capsule Take 100 mg  by mouth 1 day or 1 dose.   . lidocaine (LIDODERM) 5 % Place 1 patch onto Briana skin daily as needed (pain). Remove & Discard patch within 12 hours or as directed by MD  . pregabalin (LYRICA) 75 MG capsule Take 1 capsule (75 mg total) by mouth 2 (two) times daily.  Marland Kitchen SALINE NA Place 1 spray into both nostrils as needed (dryness).  . simvastatin (ZOCOR) 40 MG tablet Take 1 tablet (40 mg total) by mouth at bedtime.  . Tetrahydrozoline HCl (VISINE OP) Place 1 drop into both eyes as needed (dry eyes).  . traMADol (ULTRAM) 50 MG tablet Take 100 mg by mouth 3 (three) times daily as needed for pain.  Marland Kitchen zolpidem (AMBIEN) 10 MG tablet Take 10 mg by mouth at bedtime.   Marland Kitchen HYALGAN 20 MG/2ML SOSY   . [DISCONTINUED] celecoxib (CELEBREX) 200 MG capsule Take 200 mg by mouth every other day.   . [DISCONTINUED] furosemide (LASIX) 40 MG tablet Take 40 mg by mouth as needed.    No facility-administered encounter medications on file as of 01/01/2015.  :  Review of Systems:  Out of a complete 14 point review of systems, all are reviewed and negative with Briana exception of these symptoms as listed below:  Review of Systems  Neurological: Positive for tremors and numbness.       Increased R foot numbness. Tremors reported in R hand mostly, sometimes in L hand. Krause states that [name] tremors are worse, affects [name] eating.     Objective:  Neurologic Exam  Physical Exam  Physical Examination:   Filed Vitals:   01/01/15 1124  BP: 110/58  Pulse: 72  Resp: 16   General Examination: [name] is a very pleasant 74 y.o. female in no acute distress. [name] appears well-developed and well-nourished and well groomed.   HEENT: Normocephalic, atraumatic, pupils are equal, round and reactive to light and accommodation. Funduscopic exam is normal with sharp disc margins noted. Extraocular tracking is good without limitation to gaze excursion or nystagmus noted. Normal smooth pursuit is noted. Hearing is grossly intact.  Tympanic membranes are clear bilaterally. Face is symmetric with normal facial animation and normal facial sensation. Speech is clear with no dysarthria noted. There is no hypophonia. There is no lip, neck/head, jaw tremor, but [name] has a slight degree of voice tremor. Neck is supple with full range of passive and active motion. There are no carotid bruits on auscultation. Oropharynx exam reveals: mild mouth dryness, adequate dental hygiene and no significant airway crowding.   Chest: Clear to auscultation without wheezing, rhonchi or crackles noted.  Heart: S1+S2+0, regular and normal without murmurs, rubs or gallops noted.   Abdomen: Soft, non-tender and non-distended with  normal bowel sounds appreciated on auscultation.  Extremities: There is no pitting edema in Briana distal lower extremities bilaterally. Pedal pulses are intact.  Skin: Warm and dry without trophic changes noted. There are no varicose veins.  Musculoskeletal: exam reveals no obvious joint deformities, tenderness or joint swelling or erythema.   Neurologically:  Mental status: [name] is awake, alert and oriented in all 4 spheres. [name] immediate and remote memory, attention, language skills and fund of knowledge are appropriate. There is no evidence of aphasia, agnosia, apraxia or anomia. Speech is has normal prosody and enunciation. Thought process is linear. Mood is normal and affect is normal.  Cranial nerves II - XII are as described above under HEENT exam. In addition: shoulder shrug is normal with equal shoulder height noted. Motor exam: Normal bulk, strength and tone is noted. There is no drift, or rebound. There is no resting tremor. There is a bilateral upper extremity postural and action tremor, which is mild in degree. There tremor frequency is fairly fast and Briana amplitude is small. On Archimedes spiral drawing there is mild tremulousness noted. Handwriting is mildly tremulous, but legible. There is no evidence of  micrographia.  Romberg is negative. Reflexes are 2+ throughout. Babinski: Toes are flexor bilaterally. Fine motor skills and coordination: intact with normal finger taps, normal hand movements, normal rapid alternating patting, normal foot taps and normal foot agility.  Cerebellar testing: No dysmetria or intention tremor on finger to nose testing. Heel to shin is unremarkable bilaterally. There is no truncal or gait ataxia.  Sensory exam: intact to light touch, pinprick, vibration, temperature sense in Briana upper and lower extremities with mild decrease in pinprick and vibration sense in Briana right foot.  Gait, station and balance: [name] stands easily. No veering to one side is noted. No leaning to one side is noted. Posture is age-appropriate and stance is narrow based. Gait shows normal stride length and normal pace. No problems turning are noted. [name] turns en bloc. Tandem walk is unremarkable.    Assessment and Plan:  In summary, [name] is a very pleasant 74 y.o.-year old female with an underlying medical history of fibromyalgia, hypertension, hyperlipidemia, atrial fibrillation, and overweight state, who presents for follow-up consultation of [name] tremor. On examination, [name] has evidence of essential tremor. Findings are generally speaking on Briana milder to perhaps borderline moderate side. [name] is currently already on a beta blocker and has been taking Lyrica and gabapentin, primarily for [name] fibromyalgia. [name] is advised regarding essential tremor, its prognosis and treatment options. I would be reluctant to try Mysoline because of potential side effects and [name] may have to come off [name] Ambien to be able to try Mysoline because it can be quite sedating. [name] is not willing to stop Briana Ambien at this time. Symptomatic treatment for essential tremor can be quite limited in terms of medication options. I explained this to [name]. I also reassured [name] that [name] does not have any evidence of parkinsonism  at this time. At this juncture, [name] is encouraged to follow-up with [name] primary care physician as well as [name] rheumatologist for [name] fibromyalgia. I answered all [name] questions today and [name] was in agreement.   I spent 25 minutes in total face-to-face time with [name], more than 50% of which was spent in counseling and coordination of care, reviewing test results, reviewing medication and discussing or reviewing Briana diagnosis of ET, its prognosis and treatment options.

## 2015-01-01 NOTE — Patient Instructions (Signed)
  Please remember, that any kind of tremor may be exacerbated by anxiety, anger, nervousness, excitement, dehydration, sleep deprivation, by caffeine, and low blood sugar values or blood sugar fluctuations. Some medications, especially some antidepressants and lithium can cause or exacerbate tremors. Tremors may temporarily calm down her subside with the use of a benzodiazepine such as Valium or related medications and with alcohol. Be aware however that drinking alcohol is not an approved treatment or appropriate treatment for tremor control and long-term use of benzodiazepines such as Valium, lorazepam, alprazolam, or clonazepam can cause habit formation, physical and psychological addiction.

## 2015-02-09 ENCOUNTER — Other Ambulatory Visit: Payer: Self-pay | Admitting: Interventional Cardiology

## 2015-03-09 ENCOUNTER — Encounter: Payer: Self-pay | Admitting: Interventional Cardiology

## 2015-03-09 ENCOUNTER — Ambulatory Visit (INDEPENDENT_AMBULATORY_CARE_PROVIDER_SITE_OTHER): Payer: PPO | Admitting: Interventional Cardiology

## 2015-03-09 VITALS — BP 116/68 | HR 63 | Ht 65.0 in | Wt 158.0 lb

## 2015-03-09 DIAGNOSIS — E7800 Pure hypercholesterolemia, unspecified: Secondary | ICD-10-CM

## 2015-03-09 DIAGNOSIS — I10 Essential (primary) hypertension: Secondary | ICD-10-CM | POA: Diagnosis not present

## 2015-03-09 NOTE — Progress Notes (Signed)
Patient ID: [name], female   DOB: [birth year], 74 y.o.   MRN: [id number]     Cardiology Office Note   Date:  03/09/2015   ID:  [name], [name] 14, 1942, MRN [id number]  PCP:  Osborne Casco, MD    No chief complaint on file.  hypertension   Wt Readings from Last 3 Encounters:  03/09/15 158 lb (71.668 kg)  01/01/15 150 lb (68.04 kg)  12/30/13 153 lb (69.4 kg)       History of Present Illness: [name] is a 74 y.o. female  with HTN and HLD. BPs is well controlled at home.  No CP. She has some SHOB when she exerts herself. She is in a program at [facility] for patients with fibromyalgia. She does some exercises there and does some stretching exercises at home.  She feels arthritis has gotten worse. She continues to avoid knee replacement.  She was unable to tolerate HCTZ due to hyponatremia in the past. She switched her ACE-I to amlodipine and her sodium improved.   Denies palpitations, orthopnea, PND.  Overall, she feels she is doing well from a cardiovascular standpoint.    Past Medical History  Diagnosis Date  . Essential hypertension, benign   . Fibromyalgia   . Pure hypercholesterolemia 12/16/2013  . Osteoarthritis   . Low back pain     facet joint pain  . Benign essential tremor   . Hyponatremia     secondary to hctz-recurrent March 2010  . Hemorrhoids   . Osteopenia   . GERD (gastroesophageal reflux disease)   . Occipital neuralgia   . Chronic kidney disease     Past Surgical History  Procedure Laterality Date  . Tubal ligation       Current Outpatient Prescriptions  Medication Sig Dispense Refill  . amLODipine (NORVASC) 5 MG tablet TAKE ONE TABLET BY MOUTH DAILY 90 tablet 0  . atenolol (TENORMIN) 25 MG tablet TAKE ONE TABLET BY MOUTH DAILY 90 tablet 3  . gabapentin (NEURONTIN) 100 MG capsule Take 100 mg by mouth 1 day or 1 dose.     Marland Kitchen HYALGAN 20 MG/2ML SOSY     . lidocaine (LIDODERM) 5 % Place 1 patch onto the skin daily as needed (pain).  Remove & Discard patch within 12 hours or as directed by MD    . pregabalin (LYRICA) 75 MG capsule Take 1 capsule (75 mg total) by mouth 2 (two) times daily. 60 capsule 5  . SALINE NA Place 1 spray into both nostrils as needed (dryness).    . simvastatin (ZOCOR) 40 MG tablet Take 1 tablet (40 mg total) by mouth at bedtime. 90 tablet 3  . Tetrahydrozoline HCl (VISINE OP) Place 1 drop into both eyes as needed (dry eyes).    . traMADol (ULTRAM) 50 MG tablet Take 100 mg by mouth 3 (three) times daily as needed for pain.    Marland Kitchen zolpidem (AMBIEN) 10 MG tablet Take 10 mg by mouth at bedtime.      No current facility-administered medications for this visit.    Allergies:   Hctz; Lisinopril; and Sulfa antibiotics    Social History:  The patient  reports that she has never smoked. She has never used smokeless tobacco. She reports that she drinks alcohol. She reports that she does not use illicit drugs.   Family History:  The patient's family history includes Cancer in her father; Cancer - Ovarian in her mother; Heart disease in her father and mother; Stroke  in her sister.    ROS:  Please see the history of present illness.   Otherwise, review of systems are positive for knee pain.   All other systems are reviewed and negative.    PHYSICAL EXAM: VS:  BP 116/68 mmHg  Pulse 63  Ht 5\' 5"  (1.651 m)  Wt 158 lb (71.668 kg)  BMI 26.29 kg/m2 , BMI Body mass index is 26.29 kg/(m^2). GEN: Well nourished, well developed, in no acute distress HEENT: normal Neck: no JVD, carotid bruits, or masses Cardiac: RRR; no murmurs, rubs, or gallops,no edema  Respiratory:  clear to auscultation bilaterally, normal work of breathing GI: soft, nontender, nondistended, + BS MS: no deformity or atrophy Skin: warm and dry, no rash Neuro:  Strength and sensation are intact Psych: euthymic mood, full affect   EKG:   The ekg ordered today demonstrates normal   Recent Labs: No results found for requested labs within  last 365 days.   Lipid Panel No results found for: CHOL, TRIG, HDL, CHOLHDL, VLDL, LDLCALC, LDLDIRECT   Other studies Reviewed: Additional studies/ records that were reviewed today with results demonstrating: Stress test from 2015 reviewed and was normal.   ASSESSMENT AND PLAN:  1. HTN: Well controlled.  Continue current medications. 2. Hyperlipidemia: We'll recheck blood test today. Adjust statin as needed. 3. Cardiology Follow-up as needed . 4. She follows with nephrology for chronic renal insufficiency   Current medicines are reviewed at length with the patient today.  The patient concerns regarding her medicines were addressed.  The following changes have been made:  No change  Labs/ tests ordered today include:   Orders Placed This Encounter  Procedures  . Lipid Profile  . Hepatic function panel  . EKG 12-Lead    Recommend 150 minutes/week of aerobic exercise Low fat, low carb, high fiber diet recommended  Disposition:   FU in when necessary   Teresita Madura., MD  03/09/2015 11:36 AM    [facility] [place], [place], [place]  [address] Phone: [phone]; Fax: [phone]

## 2015-03-09 NOTE — Patient Instructions (Signed)
**Note De-Identified  Obfuscation** Medication Instructions:  Same-no change  Labwork: Sept. 6, 2016 (Lipids and LFT's). Please do not eat or drink after midnight the night before labs are drawn.  Testing/Procedures: None  Follow-Up: Your physician recommends that you schedule a follow-up appointment in: as needed

## 2015-03-16 ENCOUNTER — Other Ambulatory Visit: Payer: PPO

## 2015-04-07 ENCOUNTER — Other Ambulatory Visit: Payer: Self-pay | Admitting: Interventional Cardiology

## 2015-04-21 ENCOUNTER — Telehealth: Payer: Self-pay | Admitting: Interventional Cardiology

## 2015-04-21 NOTE — Telephone Encounter (Signed)
Walk in pt form-[facility] dropped off-Lynn back Thursday 10/13.

## 2015-05-13 ENCOUNTER — Other Ambulatory Visit: Payer: Self-pay | Admitting: Interventional Cardiology

## 2015-08-11 DIAGNOSIS — N3090 Cystitis, unspecified without hematuria: Secondary | ICD-10-CM | POA: Diagnosis not present

## 2015-08-11 DIAGNOSIS — R350 Frequency of micturition: Secondary | ICD-10-CM | POA: Diagnosis not present

## 2015-08-11 DIAGNOSIS — E559 Vitamin D deficiency, unspecified: Secondary | ICD-10-CM | POA: Diagnosis not present

## 2015-08-16 DIAGNOSIS — M170 Bilateral primary osteoarthritis of knee: Secondary | ICD-10-CM | POA: Diagnosis not present

## 2015-08-16 DIAGNOSIS — M1712 Unilateral primary osteoarthritis, left knee: Secondary | ICD-10-CM | POA: Diagnosis not present

## 2015-08-16 DIAGNOSIS — M1711 Unilateral primary osteoarthritis, right knee: Secondary | ICD-10-CM | POA: Diagnosis not present

## 2015-08-26 ENCOUNTER — Other Ambulatory Visit: Payer: Self-pay | Admitting: Interventional Cardiology

## 2015-08-26 MED ORDER — SIMVASTATIN 40 MG PO TABS: 40.0000 mg | ORAL_TABLET | Freq: Every day | ORAL | Status: AC

## 2015-09-01 ENCOUNTER — Ambulatory Visit (INDEPENDENT_AMBULATORY_CARE_PROVIDER_SITE_OTHER): Payer: PPO | Admitting: Podiatry

## 2015-09-01 ENCOUNTER — Encounter: Payer: Self-pay | Admitting: Podiatry

## 2015-09-01 ENCOUNTER — Ambulatory Visit (INDEPENDENT_AMBULATORY_CARE_PROVIDER_SITE_OTHER): Payer: PPO

## 2015-09-01 ENCOUNTER — Ambulatory Visit: Payer: Self-pay

## 2015-09-01 VITALS — Resp 16 | Ht 65.5 in | Wt 160.0 lb

## 2015-09-01 DIAGNOSIS — G4709 Other insomnia: Secondary | ICD-10-CM | POA: Diagnosis not present

## 2015-09-01 DIAGNOSIS — M722 Plantar fascial fibromatosis: Secondary | ICD-10-CM

## 2015-09-01 DIAGNOSIS — M79671 Pain in right foot: Secondary | ICD-10-CM

## 2015-09-01 DIAGNOSIS — M1711 Unilateral primary osteoarthritis, right knee: Secondary | ICD-10-CM | POA: Diagnosis not present

## 2015-09-01 DIAGNOSIS — R5381 Other malaise: Secondary | ICD-10-CM | POA: Diagnosis not present

## 2015-09-01 DIAGNOSIS — M797 Fibromyalgia: Secondary | ICD-10-CM | POA: Diagnosis not present

## 2015-09-01 DIAGNOSIS — M1712 Unilateral primary osteoarthritis, left knee: Secondary | ICD-10-CM | POA: Diagnosis not present

## 2015-09-01 DIAGNOSIS — M2040 Other hammer toe(s) (acquired), unspecified foot: Secondary | ICD-10-CM | POA: Diagnosis not present

## 2015-09-01 MED ORDER — TRIAMCINOLONE ACETONIDE 10 MG/ML IJ SUSP
10.0000 mg | Freq: Once | INTRAMUSCULAR | Status: AC
  Administered 2015-09-01: 10 mg

## 2015-09-01 NOTE — Patient Instructions (Signed)

## 2015-09-01 NOTE — Progress Notes (Signed)
   Subjective:    Patient ID: [name], female    DOB: [birth year], 75 y.o.   MRN: [id number]  HPI Patient presents with bilateral foot pain; 2nd toes. Pt stated, "Entire toes hurt; right toe hurts more than left". On Left foot-heel; pt stated, "feels like has a bruise on heel".   Review of Systems  Respiratory: Positive for shortness of breath.   Cardiovascular: Positive for leg swelling.  All other systems reviewed and are negative.      Objective:   Physical Exam        Assessment & Plan:

## 2015-09-01 NOTE — Progress Notes (Signed)
Subjective:     Patient ID: [name], female   DOB: 09/08/1940, 75 y.o.   MRN: [id number]  HPI patient presents stating I'm getting deformities on my second toes of both feet which can bother me and also I have a lot of heel pain left which is becoming difficult to walk with   Review of Systems  All other systems reviewed and are negative.      Objective:   Physical Exam  Constitutional: She is oriented to person, place, and time.  Cardiovascular: Intact distal pulses.   Musculoskeletal: Normal range of motion.  Neurological: She is oriented to person, place, and time.  Skin: Skin is warm.  Nursing note and vitals reviewed.  neurovascular status intact muscle strength adequate range of motion within normal limits with patient found to have elevation of the second digit bilateral with slight redness on top of the toes and exquisite discomfort plantar aspect left heel at the insertion of the tendon the calcaneus. Patient's found to have good digital perfusion is well oriented 3 with well-developed arch     Assessment:     Inflammatory fasciitis plantar heel left along with hammertoe deformity second digit bilateral    Plan:     H&P and x-rays of both conditions reviewed. Injected the plantar fascial left 3 mg Kenalog 5 mill grams Xylocaine and applied fascial brace with instructions on usage. Gave instructions on physical therapy and reappoint to recheck and applied pads to the second toes bilateral

## 2015-09-22 ENCOUNTER — Ambulatory Visit: Payer: PPO | Admitting: Podiatry

## 2015-10-01 ENCOUNTER — Other Ambulatory Visit: Payer: Self-pay | Admitting: Interventional Cardiology

## 2015-10-27 ENCOUNTER — Other Ambulatory Visit: Payer: Self-pay | Admitting: Surgical

## 2015-10-29 ENCOUNTER — Ambulatory Visit (HOSPITAL_COMMUNITY)
Admission: RE | Admit: 2015-10-29 | Discharge: 2015-10-29 | Disposition: A | Discharge status: Home / Self Care | Payer: PPO | Source: Ambulatory Visit | Attending: Surgical | Admitting: Surgical

## 2015-10-29 ENCOUNTER — Encounter (HOSPITAL_COMMUNITY): Payer: Self-pay

## 2015-10-29 ENCOUNTER — Encounter (HOSPITAL_COMMUNITY)
Admission: RE | Admit: 2015-10-29 | Discharge: 2015-10-29 | Disposition: A | Discharge status: Home / Self Care | Payer: PPO | Source: Ambulatory Visit | Attending: Orthopedic Surgery | Admitting: Orthopedic Surgery

## 2015-10-29 DIAGNOSIS — Z01818 Encounter for other preprocedural examination: Secondary | ICD-10-CM

## 2015-10-29 DIAGNOSIS — R0602 Shortness of breath: Secondary | ICD-10-CM | POA: Diagnosis not present

## 2015-10-29 HISTORY — DX: Cardiac murmur, unspecified: R01.1

## 2015-10-29 HISTORY — DX: Reserved for inherently not codable concepts without codable children: IMO0001

## 2015-10-29 HISTORY — DX: Respiratory tuberculosis unspecified: A15.9

## 2015-10-29 HISTORY — DX: Malignant (primary) neoplasm, unspecified: C80.1

## 2015-10-29 LAB — URINALYSIS, ROUTINE W REFLEX MICROSCOPIC
Bilirubin Urine: NEGATIVE
Glucose, UA: NEGATIVE mg/dL
Hgb urine dipstick: NEGATIVE
Ketones, ur: NEGATIVE mg/dL
Nitrite: NEGATIVE
Protein, ur: NEGATIVE mg/dL
Specific Gravity, Urine: 1.02 (ref 1.005–1.030)
pH: 6 (ref 5.0–8.0)

## 2015-10-29 LAB — COMPREHENSIVE METABOLIC PANEL
ALT: 32 U/L (ref 14–54)
AST: 39 U/L (ref 15–41)
Albumin: 4.3 g/dL (ref 3.5–5.0)
Alkaline Phosphatase: 92 U/L (ref 38–126)
Anion gap: 9 (ref 5–15)
BUN: 12 mg/dL (ref 6–20)
CO2: 27 mmol/L (ref 22–32)
Calcium: 9.4 mg/dL (ref 8.9–10.3)
Chloride: 100 mmol/L — ABNORMAL LOW (ref 101–111)
Creatinine, Ser: 0.89 mg/dL (ref 0.44–1.00)
GFR calc Af Amer: >60 mL/min (ref >60)
GFR calc non Af Amer: >60 mL/min (ref >60)
Glucose, Bld: 102 mg/dL — ABNORMAL HIGH (ref 65–99)
Potassium: 5 mmol/L (ref 3.5–5.1)
Sodium: 136 mmol/L (ref 135–145)
Total Bilirubin: 0.6 mg/dL (ref 0.3–1.2)
Total Protein: 7.3 g/dL (ref 6.5–8.1)

## 2015-10-29 LAB — SURGICAL PCR SCREEN
MRSA, PCR: NEGATIVE
STAPHYLOCOCCUS AUREUS: NEGATIVE

## 2015-10-29 LAB — CBC WITH DIFFERENTIAL/PLATELET
Basophils Absolute: 0 10*3/uL (ref 0.0–0.1)
Basophils Relative: 0 %
Eosinophils Absolute: 0.1 10*3/uL (ref 0.0–0.7)
Eosinophils Relative: 1 %
HCT: 40.9 % (ref 36.0–46.0)
Hemoglobin: 13.9 g/dL (ref 12.0–15.0)
Lymphocytes Relative: 33 %
Lymphs Abs: 2.2 10*3/uL (ref 0.7–4.0)
MCH: 33 pg (ref 26.0–34.0)
MCHC: 34 g/dL (ref 30.0–36.0)
MCV: 97.1 fL (ref 78.0–100.0)
Monocytes Absolute: 0.7 10*3/uL (ref 0.1–1.0)
Monocytes Relative: 11 %
Neutro Abs: 3.6 10*3/uL (ref 1.7–7.7)
Neutrophils Relative %: 55 %
Platelets: 262 10*3/uL (ref 150–400)
RBC: 4.21 MIL/uL (ref 3.87–5.11)
RDW: 13.7 % (ref 11.5–15.5)
WBC: 6.6 10*3/uL (ref 4.0–10.5)

## 2015-10-29 LAB — URINE MICROSCOPIC-ADD ON

## 2015-10-29 LAB — APTT: aPTT: 26 seconds (ref 24–37)

## 2015-10-29 LAB — ABO/RH: ABO/RH(D): AB POS

## 2015-10-29 LAB — PROTIME-INR
INR: 1.01 (ref 0.00–1.49)
Prothrombin Time: 13.1 seconds (ref 11.6–15.2)

## 2015-10-29 NOTE — Patient Instructions (Addendum)
[name]  10/29/2015   Your procedure is scheduled on: 11/08/15  Report to [facility] Main  Entrance take [facility]  elevators to 3rd floor to  [place] at 11:45 AM.  Call this number if you have problems the morning of surgery [phone]   Remember: ONLY 1 PERSON MAY GO WITH YOU TO SHORT STAY TO GET  READY MORNING OF [place].  Do not eat food or drink liquids :After Midnight. You may have clear liquids until 7:45AM morning of surgery.     Take these medicines the morning of surgery with A SIP OF WATER: Amlodipine (Norvasc), Diazepam (Valium), Pregabalin (Lyrica)  Please bring Advanced Directives for [place] to have on file morning of surgery.                                You may not have any metal on your body including hair pins and              piercings  Do not wear jewelry, make-up, lotions, powders or perfumes, deodorant             Do not wear nail polish.  Do not shave  48 hours prior to surgery.              Men may shave face and neck.   Do not bring valuables to the hospital. [place].  Contacts, dentures or bridgework may not be worn into surgery.  Leave suitcase in the car. After surgery it may be brought to your room.                Please read over the following fact sheets you were given: _____________________________________________________________________             [facility] - Preparing for Surgery Before surgery, you can play an important role.  Because skin is not sterile, your skin needs to be as free of germs as possible.  You can reduce the number of germs on your skin by washing with CHG (chlorahexidine gluconate) soap before surgery.  CHG is an antiseptic cleaner which kills germs and bonds with the skin to continue killing germs even after washing. Please DO NOT use if you have an allergy to CHG or antibacterial soaps.  If your skin becomes reddened/irritated stop  using the CHG and inform your nurse when you arrive at Short Stay. Do not shave (including legs and underarms) for at least 48 hours prior to the first CHG shower.  You may shave your face/neck. Please follow these instructions carefully:  1.  Shower with CHG Soap the night before surgery and the  morning of Surgery.  2.  If you choose to wash your hair, wash your hair first as usual with your  normal  shampoo.  3.  After you shampoo, rinse your hair and body thoroughly to remove the  shampoo.                           4.  Use CHG as you would any other liquid soap.  You can apply chg directly  to the skin and wash  Gently with a scrungie or clean washcloth.  5.  Apply the CHG Soap to your body ONLY FROM THE NECK DOWN.   Do not use on face/ open                           Wound or open sores. Avoid contact with eyes, ears mouth and genitals (private parts).                       Wash face,  Genitals (private parts) with your normal soap.             6.  Wash thoroughly, paying special attention to the area where your surgery  will be performed.  7.  Thoroughly rinse your body with warm water from the neck down.  8.  DO NOT shower/wash with your normal soap after using and rinsing off  the CHG Soap.                9.  Pat yourself dry with a clean towel.            10.  Wear clean pajamas.            11.  Place clean sheets on your bed the night of your first shower and do not  sleep with pets. Day of Surgery : Do not apply any lotions/deodorants the morning of surgery.  Please wear clean clothes to the hospital/surgery center.  FAILURE TO FOLLOW THESE INSTRUCTIONS MAY RESULT IN THE CANCELLATION OF YOUR SURGERY PATIENT SIGNATURE_________________________________  NURSE SIGNATURE__________________________________  ________________________________________________________________________   Adam Phenix  An incentive spirometer is a tool that can help keep your  lungs clear and active. This tool measures how well you are filling your lungs with each breath. Taking long deep breaths may help reverse or decrease the chance of developing breathing (pulmonary) problems (especially infection) following:  A long period of time when you are unable to move or be active. BEFORE THE PROCEDURE   If the spirometer includes an indicator to show your best effort, your nurse or respiratory therapist will set it to a desired goal.  If possible, sit up straight or lean slightly forward. Try not to slouch.  Hold the incentive spirometer in an upright position. INSTRUCTIONS FOR USE   Sit on the edge of your bed if possible, or sit up as far as you can in bed or on a chair.  Hold the incentive spirometer in an upright position.  Breathe out normally.  Place the mouthpiece in your mouth and seal your lips tightly around it.  Breathe in slowly and as deeply as possible, raising the piston or the ball toward the top of the column.  Hold your breath for 3-5 seconds or for as long as possible. Allow the piston or ball to fall to the bottom of the column.  Remove the mouthpiece from your mouth and breathe out normally.  Rest for a few seconds and repeat Steps 1 through 7 at least 10 times every 1-2 hours when you are awake. Take your time and take a few normal breaths between deep breaths.  The spirometer may include an indicator to show your best effort. Use the indicator as a goal to work toward during each repetition.  After each set of 10 deep breaths, practice coughing to be sure your lungs are clear. If you have an incision (the cut made at the time of surgery),  support your incision when coughing by placing a pillow or rolled up towels firmly against it. Once you are able to get out of bed, walk around indoors and cough well. You may stop using the incentive spirometer when instructed by your caregiver.  RISKS AND COMPLICATIONS  Take your time so you do not  get dizzy or light-headed.  If you are in pain, you may need to take or ask for pain medication before doing incentive spirometry. It is harder to take a deep breath if you are having pain. AFTER USE  Rest and breathe slowly and easily.  It can be helpful to keep track of a log of your progress. Your caregiver can provide you with a simple table to help with this. If you are using the spirometer at home, follow these instructions: [place] IF:   You are having difficultly using the spirometer.  You have trouble using the spirometer as often as instructed.  Your pain medication is not giving enough relief while using the spirometer.  You develop fever of 100.5 F (38.1 C) or higher. SEEK IMMEDIATE MEDICAL CARE IF:   You cough up bloody sputum that had not been present before.  You develop fever of 102 F (38.9 C) or greater.  You develop worsening pain at or near the incision site. MAKE SURE YOU:   Understand these instructions.  Will watch your condition.  Will get help right away if you are not doing well or get worse. Document Released: 11/06/2006 Document Revised: 09/18/2011 Document Reviewed: 01/07/2007 ExitCare Patient Information 2014 ExitCare, [place].   ________________________________________________________________________  WHAT IS A BLOOD TRANSFUSION? Blood Transfusion Information  A transfusion is the replacement of blood or some of its parts. Blood is made up of multiple cells which provide different functions.  Red blood cells carry oxygen and are used for blood loss replacement.  White blood cells fight against infection.  Platelets control bleeding.  Plasma helps clot blood.  Other blood products are available for specialized needs, such as hemophilia or other clotting disorders. BEFORE THE TRANSFUSION  Who gives blood for transfusions?   Healthy volunteers who are fully evaluated to make sure their blood is safe. This is blood bank  blood. Transfusion therapy is the safest it has ever been in the practice of medicine. Before blood is taken from a donor, a complete history is taken to make sure that person has no history of diseases nor engages in risky social behavior (examples are intravenous drug use or sexual activity with multiple partners). The donor's travel history is screened to minimize risk of transmitting infections, such as malaria. The donated blood is tested for signs of infectious diseases, such as HIV and hepatitis. The blood is then tested to be sure it is compatible with you in order to minimize the chance of a transfusion reaction. If you or a relative donates blood, this is often done in anticipation of surgery and is not appropriate for emergency situations. It takes many days to process the donated blood. RISKS AND COMPLICATIONS Although transfusion therapy is very safe and saves many lives, the main dangers of transfusion include:   Getting an infectious disease.  Developing a transfusion reaction. This is an allergic reaction to something in the blood you were given. Every precaution is taken to prevent this. The decision to have a blood transfusion has been considered carefully by your caregiver before blood is given. Blood is not given unless the benefits outweigh the risks. AFTER THE TRANSFUSION  Right after receiving a blood transfusion, you will usually feel much better and more energetic. This is especially true if your red blood cells have gotten low (anemic). The transfusion raises the level of the red blood cells which carry oxygen, and this usually causes an energy increase.  The nurse administering the transfusion will monitor you carefully for complications. HOME CARE INSTRUCTIONS  No special instructions are needed after a transfusion. You may find your energy is better. Speak with your caregiver about any limitations on activity for underlying diseases you may have. SEEK MEDICAL CARE IF:    Your condition is not improving after your transfusion.  You develop redness or irritation at the intravenous (IV) site. SEEK IMMEDIATE MEDICAL CARE IF:  Any of the following symptoms occur over the next 12 hours:  Shaking chills.  You have a temperature by mouth above 102 F (38.9 C), not controlled by medicine.  Chest, back, or muscle pain.  People around you feel you are not acting correctly or are confused.  Shortness of breath or difficulty breathing.  Dizziness and fainting.  You get a rash or develop hives.  You have a decrease in urine output.  Your urine turns a dark color or changes to pink, red, or brown. Any of the following symptoms occur over the next 10 days:  You have a temperature by mouth above 102 F (38.9 C), not controlled by medicine.  Shortness of breath.  Weakness after normal activity.  The white part of the eye turns yellow (jaundice).  You have a decrease in the amount of urine or are urinating less often.  Your urine turns a dark color or changes to pink, red, or brown. Document Released: 06/23/2000 Document Revised: 09/18/2011 Document Reviewed: 02/10/2008 ExitCare Patient Information 2014 ExitCare, [place].  _______________________________________________________________________   CLEAR LIQUID DIET   Foods Allowed                                                                     Foods Excluded  Coffee and tea, regular and decaf                             liquids that you cannot  Plain Jell-O in any flavor                                             see through such as: Fruit ices (not with fruit pulp)                                     milk, soups, orange juice  Iced Popsicles                                    All solid food Carbonated beverages, regular and diet  Cranberry, grape and apple juices Sports drinks like Gatorade Lightly seasoned clear broth or consume(fat free) Sugar, honey  syrup  Sample Menu Breakfast                                Lunch                                     Supper Cranberry juice                    Beef broth                            Chicken broth Jell-O                                     Grape juice                           Apple juice Coffee or tea                        Jell-O                                      Popsicle                                                Coffee or tea                        Coffee or tea  _____________________________________________________________________

## 2015-10-29 NOTE — Pre-Procedure Instructions (Addendum)
EKG 03-09-15 epic Medical Clearance Dr. Laurann [place], on chart

## 2015-11-07 ENCOUNTER — Ambulatory Visit: Payer: Self-pay | Admitting: Orthopedic Surgery

## 2015-11-07 NOTE — H&P (Signed)
[name] DOB: [birth year] Divorced / Language: [name] / Race: White Female Date of Admission:  11/08/2015 CC:  Left Knee Pain History of Present Illness The patient is a 75 year old female who comes in for a preoperative History and Physical. The patient is scheduled for a left total knee arthroplasty to be performed by Dr. Dione Plover. Aluisio, MD at [facility] on 11-08-2015. The patient is a 75 year old female who presents with knee complaints. The patient reports left knee (worse than right) symptoms including: pain, instability, stiffness, soreness and grinding which began 5 year(s) ago without any known injury.The patient feels that the symptoms are worsening. The patient has the current diagnosis of knee osteoarthritis. Prior to being seen today the patient was previously evaluated by a colleague (Dr. Patrecia Pour). Previous work-up for this problem has included knee x-rays and orthopedic consultation (surgery consult with Dr. Durward Fortes). Past treatment for this problem has included intra-articular injection of corticosteroids as well as viscosupplementation in the left knee. She has not yet been treated for the right knee. She states that the injections do help temporarily, but she would to proceed with surgery. She has several friends that had surgery by Dr. Wynelle Link, and decided to come here. Current treatment includes opioid analgesics (tramadol). She is now having pain at all times, worse with weightbearing. She is having increased right knee pain because of compensating for the left knee. No associated groin or radicular pain. Unfortunately, her left knee has gotten progressively worse over time. She says it is hurting in all times now. It hurts day and night. It keeps her awake at night. It is limiting what she can and cannot do. She has had cortisone injections and multiple series of viscosupplement injections, which are no longer beneficial. She is ready to proceed with surgery at this  time. They have been treated conservatively in the past for the above stated problem and despite conservative measures, they continue to have progressive pain and severe functional limitations and dysfunction. They have failed non-operative management including home exercise, medications, and injections. It is felt that they would benefit from undergoing total joint replacement. Risks and benefits of the procedure have been discussed with the patient and they elect to proceed with surgery. There are no active contraindications to surgery such as ongoing infection or rapidly progressive neurological disease.  Problem List/Past Medical Primary osteoarthritis of both knees (M17.0)  Osteoarthritis of left knee (M17.12)  Chronic Pain  Fibromyalgia  Heart murmur  High blood pressure  Hypercholesterolemia  Osteoarthritis  Skin Cancer  Urinary tract infection in female (N39.0)  Tuberculosis  Varicose veins  Measles  Allergies  Lisinopril *ANTIHYPERTENSIVES*  drop in sodium SulfADIAZINE *Sulfonamides**  Rash.   Family History  Cancer  mother and father Cerebrovascular Accident  sister Osteoarthritis  mother, father and sister  Social History  Alcohol use  current drinker; drinks wine; 5-7 per week Children  2 Current work status  retired Engineer, agricultural (Currently)  no Drug/Alcohol Rehab (Previously)  no Exercise  Exercises daily; does running / walking and gym / weights Illicit drug use  no Living situation  live alone Marital status  divorced Number of flights of stairs before winded  less than 1 Pain Contract  no Tobacco use  never smoker Psychologist, prison and probation services Will, Healthcare POA  Medication History  TraMADol HCl (50MG  Tablet, Oral) Active. Voltaren (1% Gel, Transdermal) Active. AmLODIPine Besylate (5MG  Tablet, Oral) Active. Atenolol (25MG  Tablet, Oral) Active. Lyrica (Oral) Specific strength  unknown - Active. Simvastatin (Oral)  Specific strength unknown - Active. Ambien (Oral) Specific strength unknown - Active. Gabapentin (Oral) Specific strength unknown - Active. DiazePAM (Oral) Specific strength unknown - Active.  Past Surgical History Tonsillectomy  Tubal Ligation  Date: 35.    Review of Systems General Not Present- Chills, Fatigue, Fever, Memory Loss, Night Sweats, Weight Gain and Weight Loss. Skin Not Present- Eczema, Hives, Itching, Lesions and Rash. HEENT Not Present- Dentures, Double Vision, Headache, Hearing Loss, Tinnitus and Visual Loss. Respiratory Present- Shortness of breath with exertion. Not Present- Allergies, Chronic Cough, Coughing up blood and Shortness of breath at rest. Cardiovascular Present- Swelling. Not Present- Chest Pain, Difficulty Breathing Lying Down, Murmur, Palpitations and Racing/skipping heartbeats. Gastrointestinal Not Present- Abdominal Pain, Bloody Stool, Constipation, Diarrhea, Difficulty Swallowing, Heartburn, Jaundice, Loss of appetitie, Nausea and Vomiting. Female Genitourinary Not Present- Blood in Urine, Discharge, Flank Pain, Incontinence, Painful Urination, Urgency, Urinary frequency, Urinary Retention, Urinating at Night and Weak urinary stream. Musculoskeletal Present- Back Pain, Joint Pain, Muscle Pain and Muscle Weakness. Not Present- Joint Swelling, Morning Stiffness and Spasms. Neurological Present- Tremor. Not Present- Blackout spells, Difficulty with balance, Dizziness, Paralysis and Weakness. Psychiatric Present- Insomnia.  Vitals Weight: 155 lb Height: 66in Height was reported by patient. Body Surface Area: 1.79 m Body Mass Index: 25.02 kg/m  BP: 132/60 (Sitting, Left Arm, Standard)  Physical Exam General Mental Status -Alert, cooperative and good historian. General Appearance-pleasant, Not in acute distress. Orientation-Oriented X3. Build & Nutrition-Well nourished and Well developed.  Head and Neck Head-normocephalic,  atraumatic . Neck Global Assessment - supple, no bruit auscultated on the right, no bruit auscultated on the left.  Eye Pupil - Bilateral-Regular and Round. Motion - Bilateral-EOMI.  Chest and Lung Exam Auscultation Breath sounds - clear at anterior chest wall and clear at posterior chest wall. Adventitious sounds - No Adventitious sounds.  Cardiovascular Auscultation Rhythm - Regular rate and rhythm. Heart Sounds - S1 WNL and S2 WNL. Murmurs & Other Heart Sounds - Auscultation of the heart reveals - No Murmurs.  Abdomen Palpation/Percussion Tenderness - Abdomen is non-tender to palpation. Rigidity (guarding) - Abdomen is soft. Auscultation Auscultation of the abdomen reveals - Bowel sounds normal.  Female Genitourinary Note: Not done, not pertinent to present illness   Musculoskeletal Note: On exam, very pleasant, well-developed female, alert and oriented, in no apparent distress. Both hips show normal range of motion with no discomfort. Her left knee shows no effusion. Range about 5 to 125 degrees. She is very tender on the medial joint line, slight lateral tenderness also. There is no instability noted. Right hip has normal range of motion, no discomfort. Right knee shows no effusion. Range 0 to 125. Slight crepitus in range of motion. Slight tenderness medial greater than lateral, no instability. Pulse, sensation and motor are intact.  IMAGING Radiographs, AP and lateral both knees show that she has bone on bone arthritis in the medial and patellofemoral compartments of the left knee. She also has arthritic change in her right, but not as advanced.  Assessment & Plan Osteoarthritis of left knee (M17.12)  Note:Surgical Plans: Left Total Knee Replacement  Disposition: Home with Daughter  PCP: Dr. Kelton Pillar  IV TXA  Anesthesia Issues: None  Signed electronically by Ok Edwards, III PA-C

## 2015-11-08 ENCOUNTER — Encounter (HOSPITAL_COMMUNITY)
Admission: RE | Disposition: A | Discharge status: Home Health | Payer: Self-pay | Source: Ambulatory Visit | Attending: Orthopedic Surgery

## 2015-11-08 ENCOUNTER — Inpatient Hospital Stay (HOSPITAL_COMMUNITY): Payer: PPO | Admitting: Certified Registered"

## 2015-11-08 ENCOUNTER — Inpatient Hospital Stay (HOSPITAL_COMMUNITY)
Admission: RE | Admit: 2015-11-08 | Discharge: 2015-11-10 | DRG: 470 | Disposition: A | Discharge status: Home Health | Payer: PPO | Source: Ambulatory Visit | Attending: Orthopedic Surgery | Admitting: Orthopedic Surgery

## 2015-11-08 ENCOUNTER — Encounter (HOSPITAL_COMMUNITY): Payer: Self-pay | Admitting: *Deleted

## 2015-11-08 DIAGNOSIS — E7800 Pure hypercholesterolemia, unspecified: Secondary | ICD-10-CM | POA: Diagnosis present

## 2015-11-08 DIAGNOSIS — M170 Bilateral primary osteoarthritis of knee: Principal | ICD-10-CM | POA: Diagnosis present

## 2015-11-08 DIAGNOSIS — Z79899 Other long term (current) drug therapy: Secondary | ICD-10-CM | POA: Diagnosis not present

## 2015-11-08 DIAGNOSIS — G8929 Other chronic pain: Secondary | ICD-10-CM | POA: Diagnosis present

## 2015-11-08 DIAGNOSIS — M1712 Unilateral primary osteoarthritis, left knee: Secondary | ICD-10-CM

## 2015-11-08 DIAGNOSIS — M25562 Pain in left knee: Secondary | ICD-10-CM | POA: Diagnosis not present

## 2015-11-08 DIAGNOSIS — K219 Gastro-esophageal reflux disease without esophagitis: Secondary | ICD-10-CM | POA: Diagnosis not present

## 2015-11-08 DIAGNOSIS — M1710 Unilateral primary osteoarthritis, unspecified knee: Secondary | ICD-10-CM | POA: Diagnosis present

## 2015-11-08 DIAGNOSIS — M797 Fibromyalgia: Secondary | ICD-10-CM | POA: Diagnosis present

## 2015-11-08 DIAGNOSIS — M179 Osteoarthritis of knee, unspecified: Secondary | ICD-10-CM | POA: Diagnosis not present

## 2015-11-08 DIAGNOSIS — I129 Hypertensive chronic kidney disease with stage 1 through stage 4 chronic kidney disease, or unspecified chronic kidney disease: Secondary | ICD-10-CM | POA: Diagnosis present

## 2015-11-08 DIAGNOSIS — N189 Chronic kidney disease, unspecified: Secondary | ICD-10-CM | POA: Diagnosis present

## 2015-11-08 HISTORY — PX: TOTAL KNEE ARTHROPLASTY: SHX125

## 2015-11-08 LAB — TYPE AND SCREEN
ABO/RH(D): AB POS
Antibody Screen: NEGATIVE

## 2015-11-08 SURGERY — ARTHROPLASTY, KNEE, TOTAL
Anesthesia: Spinal | Site: Knee | Laterality: Left

## 2015-11-08 MED ORDER — DIPHENHYDRAMINE HCL 12.5 MG/5ML PO ELIX: 12.5000 mg | ORAL_SOLUTION | ORAL | Status: DC | PRN

## 2015-11-08 MED ORDER — CEFAZOLIN SODIUM-DEXTROSE 2-4 GM/100ML-% IV SOLN
INTRAVENOUS | Status: AC
  Filled 2015-11-08: qty 100

## 2015-11-08 MED ORDER — PHENYLEPHRINE 40 MCG/ML (10ML) SYRINGE FOR IV PUSH (FOR BLOOD PRESSURE SUPPORT)
PREFILLED_SYRINGE | INTRAVENOUS | Status: AC
  Filled 2015-11-08: qty 10

## 2015-11-08 MED ORDER — DEXAMETHASONE SODIUM PHOSPHATE 10 MG/ML IJ SOLN
10.0000 mg | Freq: Once | INTRAMUSCULAR | Status: AC
  Administered 2015-11-08: 10 mg via INTRAVENOUS

## 2015-11-08 MED ORDER — SIMVASTATIN 40 MG PO TABS
40.0000 mg | ORAL_TABLET | Freq: Every day | ORAL | Status: DC
  Administered 2015-11-08 – 2015-11-09 (×2): 40 mg via ORAL
  Filled 2015-11-08 (×3): qty 1

## 2015-11-08 MED ORDER — ONDANSETRON HCL 4 MG/2ML IJ SOLN: 4.0000 mg | Freq: Four times a day (QID) | INTRAMUSCULAR | Status: DC | PRN

## 2015-11-08 MED ORDER — BUPIVACAINE LIPOSOME 1.3 % IJ SUSP
20.0000 mL | Freq: Once | INTRAMUSCULAR | Status: DC
  Filled 2015-11-08: qty 20

## 2015-11-08 MED ORDER — ZOLPIDEM TARTRATE 5 MG PO TABS
5.0000 mg | ORAL_TABLET | Freq: Every day | ORAL | Status: DC
  Administered 2015-11-08 – 2015-11-09 (×2): 5 mg via ORAL
  Filled 2015-11-08 (×2): qty 1

## 2015-11-08 MED ORDER — OXYCODONE HCL 5 MG PO TABS
5.0000 mg | ORAL_TABLET | ORAL | Status: DC | PRN
  Administered 2015-11-08: 10 mg via ORAL
  Administered 2015-11-08 (×2): 5 mg via ORAL
  Administered 2015-11-09 (×2): 10 mg via ORAL
  Filled 2015-11-08: qty 2
  Filled 2015-11-08 (×2): qty 1
  Filled 2015-11-08: qty 2
  Filled 2015-11-08 (×2): qty 1

## 2015-11-08 MED ORDER — PHENYLEPHRINE HCL 10 MG/ML IJ SOLN
10.0000 mg | INTRAVENOUS | Status: DC | PRN
  Administered 2015-11-08: 35 ug/min via INTRAVENOUS

## 2015-11-08 MED ORDER — SODIUM CHLORIDE 0.9 % IJ SOLN
INTRAMUSCULAR | Status: AC
  Filled 2015-11-08: qty 50

## 2015-11-08 MED ORDER — CHLORHEXIDINE GLUCONATE 4 % EX LIQD: 60.0000 mL | Freq: Once | CUTANEOUS | Status: DC

## 2015-11-08 MED ORDER — DEXAMETHASONE SODIUM PHOSPHATE 10 MG/ML IJ SOLN
10.0000 mg | Freq: Once | INTRAMUSCULAR | Status: DC
  Filled 2015-11-08: qty 1

## 2015-11-08 MED ORDER — ACETAMINOPHEN 10 MG/ML IV SOLN
INTRAVENOUS | Status: DC | PRN
  Administered 2015-11-08: 1000 mg via INTRAVENOUS

## 2015-11-08 MED ORDER — METOCLOPRAMIDE HCL 5 MG/ML IJ SOLN: 5.0000 mg | Freq: Three times a day (TID) | INTRAMUSCULAR | Status: DC | PRN

## 2015-11-08 MED ORDER — TRAMADOL HCL 50 MG PO TABS: 50.0000 mg | ORAL_TABLET | Freq: Four times a day (QID) | ORAL | Status: DC | PRN

## 2015-11-08 MED ORDER — ACETAMINOPHEN 650 MG RE SUPP: 650.0000 mg | Freq: Four times a day (QID) | RECTAL | Status: DC | PRN

## 2015-11-08 MED ORDER — FENTANYL CITRATE (PF) 250 MCG/5ML IJ SOLN
INTRAMUSCULAR | Status: DC | PRN
  Administered 2015-11-08 (×2): 50 ug via INTRAVENOUS

## 2015-11-08 MED ORDER — PROPOFOL 10 MG/ML IV BOLUS
INTRAVENOUS | Status: AC
  Filled 2015-11-08: qty 20

## 2015-11-08 MED ORDER — POLYETHYLENE GLYCOL 3350 17 G PO PACK: 17.0000 g | PACK | Freq: Every day | ORAL | Status: DC | PRN

## 2015-11-08 MED ORDER — MEPERIDINE HCL 50 MG/ML IJ SOLN: 6.2500 mg | INTRAMUSCULAR | Status: DC | PRN

## 2015-11-08 MED ORDER — SODIUM CHLORIDE 0.9 % IJ SOLN
INTRAMUSCULAR | Status: DC | PRN
  Administered 2015-11-08: 30 mL

## 2015-11-08 MED ORDER — CEFAZOLIN SODIUM-DEXTROSE 2-4 GM/100ML-% IV SOLN
2.0000 g | INTRAVENOUS | Status: AC
  Administered 2015-11-08: 2 g via INTRAVENOUS
  Filled 2015-11-08: qty 100

## 2015-11-08 MED ORDER — MORPHINE SULFATE (PF) 2 MG/ML IV SOLN
1.0000 mg | INTRAVENOUS | Status: DC | PRN
  Administered 2015-11-09 (×3): 1 mg via INTRAVENOUS
  Filled 2015-11-08 (×3): qty 1

## 2015-11-08 MED ORDER — FENTANYL CITRATE (PF) 250 MCG/5ML IJ SOLN
INTRAMUSCULAR | Status: AC
  Filled 2015-11-08: qty 5

## 2015-11-08 MED ORDER — PROPOFOL 500 MG/50ML IV EMUL
INTRAVENOUS | Status: DC | PRN
  Administered 2015-11-08: 25 ug/kg/min via INTRAVENOUS

## 2015-11-08 MED ORDER — CEFAZOLIN SODIUM-DEXTROSE 2-4 GM/100ML-% IV SOLN
2.0000 g | Freq: Four times a day (QID) | INTRAVENOUS | Status: AC
  Administered 2015-11-08 – 2015-11-09 (×2): 2 g via INTRAVENOUS
  Filled 2015-11-08 (×2): qty 100

## 2015-11-08 MED ORDER — SODIUM CHLORIDE 0.9 % IV SOLN
INTRAVENOUS | Status: DC
  Administered 2015-11-08: 18:00:00 via INTRAVENOUS

## 2015-11-08 MED ORDER — SODIUM CHLORIDE 0.9 % IR SOLN
Status: DC | PRN
  Administered 2015-11-08: 1000 mL

## 2015-11-08 MED ORDER — METOCLOPRAMIDE HCL 10 MG PO TABS: 5.0000 mg | ORAL_TABLET | Freq: Three times a day (TID) | ORAL | Status: DC | PRN

## 2015-11-08 MED ORDER — BUPIVACAINE IN DEXTROSE 0.75-8.25 % IT SOLN
INTRATHECAL | Status: DC | PRN
  Administered 2015-11-08: 15 mg via INTRATHECAL

## 2015-11-08 MED ORDER — METHOCARBAMOL 500 MG PO TABS
500.0000 mg | ORAL_TABLET | Freq: Four times a day (QID) | ORAL | Status: DC | PRN
  Administered 2015-11-09 – 2015-11-10 (×6): 500 mg via ORAL
  Filled 2015-11-08 (×6): qty 1

## 2015-11-08 MED ORDER — HYDROMORPHONE HCL 1 MG/ML IJ SOLN: 0.2500 mg | INTRAMUSCULAR | Status: DC | PRN

## 2015-11-08 MED ORDER — DIAZEPAM 5 MG PO TABS
5.0000 mg | ORAL_TABLET | Freq: Three times a day (TID) | ORAL | Status: DC | PRN
  Administered 2015-11-09: 5 mg via ORAL
  Filled 2015-11-08: qty 1

## 2015-11-08 MED ORDER — RIVAROXABAN 10 MG PO TABS
10.0000 mg | ORAL_TABLET | Freq: Every day | ORAL | Status: DC
  Administered 2015-11-09 – 2015-11-10 (×2): 10 mg via ORAL
  Filled 2015-11-08 (×3): qty 1

## 2015-11-08 MED ORDER — MIDAZOLAM HCL 2 MG/2ML IJ SOLN
INTRAMUSCULAR | Status: DC | PRN
  Administered 2015-11-08 (×2): 1 mg via INTRAVENOUS

## 2015-11-08 MED ORDER — MENTHOL 3 MG MT LOZG: 1.0000 | LOZENGE | OROMUCOSAL | Status: DC | PRN

## 2015-11-08 MED ORDER — AMLODIPINE BESYLATE 5 MG PO TABS
5.0000 mg | ORAL_TABLET | Freq: Every day | ORAL | Status: DC
  Administered 2015-11-10: 5 mg via ORAL
  Filled 2015-11-08 (×2): qty 1

## 2015-11-08 MED ORDER — GABAPENTIN 100 MG PO CAPS
100.0000 mg | ORAL_CAPSULE | Freq: Every day | ORAL | Status: DC
  Administered 2015-11-08 – 2015-11-09 (×2): 100 mg via ORAL
  Filled 2015-11-08 (×3): qty 1

## 2015-11-08 MED ORDER — METHOCARBAMOL 1000 MG/10ML IJ SOLN
500.0000 mg | Freq: Four times a day (QID) | INTRAVENOUS | Status: DC | PRN
  Administered 2015-11-08: 500 mg via INTRAVENOUS
  Filled 2015-11-08 (×2): qty 5

## 2015-11-08 MED ORDER — PHENOL 1.4 % MT LIQD: 1.0000 | OROMUCOSAL | Status: DC | PRN

## 2015-11-08 MED ORDER — PHENYLEPHRINE HCL 10 MG/ML IJ SOLN
INTRAMUSCULAR | Status: DC | PRN
  Administered 2015-11-08 (×6): 80 ug via INTRAVENOUS

## 2015-11-08 MED ORDER — ACETAMINOPHEN 325 MG PO TABS: 650.0000 mg | ORAL_TABLET | Freq: Four times a day (QID) | ORAL | Status: DC | PRN

## 2015-11-08 MED ORDER — PREGABALIN 75 MG PO CAPS
75.0000 mg | ORAL_CAPSULE | Freq: Two times a day (BID) | ORAL | Status: DC
  Administered 2015-11-08 – 2015-11-10 (×4): 75 mg via ORAL
  Filled 2015-11-08 (×4): qty 1

## 2015-11-08 MED ORDER — BUPIVACAINE HCL 0.25 % IJ SOLN
INTRAMUSCULAR | Status: DC | PRN
  Administered 2015-11-08: 20 mL

## 2015-11-08 MED ORDER — LACTATED RINGERS IV SOLN
INTRAVENOUS | Status: DC
  Administered 2015-11-08 (×4): via INTRAVENOUS

## 2015-11-08 MED ORDER — BUPIVACAINE HCL (PF) 0.25 % IJ SOLN
INTRAMUSCULAR | Status: AC
  Filled 2015-11-08: qty 30

## 2015-11-08 MED ORDER — DEXAMETHASONE SODIUM PHOSPHATE 10 MG/ML IJ SOLN
INTRAMUSCULAR | Status: AC
  Filled 2015-11-08: qty 1

## 2015-11-08 MED ORDER — ACETAMINOPHEN 10 MG/ML IV SOLN
INTRAVENOUS | Status: AC
  Filled 2015-11-08: qty 100

## 2015-11-08 MED ORDER — POLYVINYL ALCOHOL 1.4 % OP SOLN
1.0000 [drp] | OPHTHALMIC | Status: DC | PRN
  Administered 2015-11-09: 1 [drp] via OPHTHALMIC
  Filled 2015-11-08 (×2): qty 15

## 2015-11-08 MED ORDER — 0.9 % SODIUM CHLORIDE (POUR BTL) OPTIME
TOPICAL | Status: DC | PRN
  Administered 2015-11-08: 1000 mL

## 2015-11-08 MED ORDER — BUPIVACAINE LIPOSOME 1.3 % IJ SUSP
INTRAMUSCULAR | Status: DC | PRN
  Administered 2015-11-08: 20 mL

## 2015-11-08 MED ORDER — BISACODYL 10 MG RE SUPP: 10.0000 mg | Freq: Every day | RECTAL | Status: DC | PRN

## 2015-11-08 MED ORDER — ATENOLOL 25 MG PO TABS
25.0000 mg | ORAL_TABLET | Freq: Every day | ORAL | Status: DC
  Administered 2015-11-08 – 2015-11-09 (×2): 25 mg via ORAL
  Filled 2015-11-08 (×3): qty 1

## 2015-11-08 MED ORDER — ACETAMINOPHEN 500 MG PO TABS
1000.0000 mg | ORAL_TABLET | Freq: Four times a day (QID) | ORAL | Status: AC
  Administered 2015-11-08 – 2015-11-09 (×3): 1000 mg via ORAL
  Filled 2015-11-08 (×3): qty 2

## 2015-11-08 MED ORDER — MIDAZOLAM HCL 2 MG/2ML IJ SOLN
INTRAMUSCULAR | Status: AC
  Filled 2015-11-08: qty 2

## 2015-11-08 MED ORDER — SODIUM CHLORIDE 0.9 % IV SOLN
1000.0000 mg | Freq: Once | INTRAVENOUS | Status: AC
  Administered 2015-11-08: 1000 mg via INTRAVENOUS
  Filled 2015-11-08: qty 10

## 2015-11-08 MED ORDER — ONDANSETRON HCL 4 MG PO TABS: 4.0000 mg | ORAL_TABLET | Freq: Four times a day (QID) | ORAL | Status: DC | PRN

## 2015-11-08 MED ORDER — DOCUSATE SODIUM 100 MG PO CAPS
100.0000 mg | ORAL_CAPSULE | Freq: Two times a day (BID) | ORAL | Status: DC
  Administered 2015-11-08 – 2015-11-10 (×4): 100 mg via ORAL

## 2015-11-08 MED ORDER — FLEET ENEMA 7-19 GM/118ML RE ENEM: 1.0000 | ENEMA | Freq: Once | RECTAL | Status: DC | PRN

## 2015-11-08 MED ORDER — MIDAZOLAM HCL 2 MG/2ML IJ SOLN: 0.5000 mg | Freq: Once | INTRAMUSCULAR | Status: DC | PRN

## 2015-11-08 MED ORDER — TRANEXAMIC ACID 1000 MG/10ML IV SOLN
1000.0000 mg | INTRAVENOUS | Status: AC
  Administered 2015-11-08: 1000 mg via INTRAVENOUS
  Filled 2015-11-08: qty 10

## 2015-11-08 SURGICAL SUPPLY — 50 items
BAG DECANTER FOR FLEXI CONT (MISCELLANEOUS) ×1 IMPLANT
BAG SPEC THK2 15X12 ZIP CLS (MISCELLANEOUS) ×1
BAG ZIPLOCK 12X15 (MISCELLANEOUS) ×2 IMPLANT
BANDAGE ACE 6X5 VEL STRL LF (GAUZE/BANDAGES/DRESSINGS) ×2 IMPLANT
BLADE SAG 18X100X1.27 (BLADE) ×2 IMPLANT
BLADE SAW SGTL 11.0X1.19X90.0M (BLADE) ×2 IMPLANT
BOWL SMART MIX CTS (DISPOSABLE) ×2 IMPLANT
CAP KNEE TOTAL 3 SIGMA ×1 IMPLANT
CEMENT HV SMART SET (Cement) ×4 IMPLANT
CLOTH BEACON ORANGE TIMEOUT ST (SAFETY) ×2 IMPLANT
CUFF TOURN SGL QUICK 34 (TOURNIQUET CUFF) ×2
CUFF TRNQT CYL 34X4X40X1 (TOURNIQUET CUFF) ×1 IMPLANT
DECANTER SPIKE VIAL GLASS SM (MISCELLANEOUS) ×2 IMPLANT
DRAPE U-SHAPE 47X51 STRL (DRAPES) ×2 IMPLANT
DRSG ADAPTIC 3X8 NADH LF (GAUZE/BANDAGES/DRESSINGS) ×2 IMPLANT
DRSG PAD ABDOMINAL 8X10 ST (GAUZE/BANDAGES/DRESSINGS) ×2 IMPLANT
DURAPREP 26ML APPLICATOR (WOUND CARE) ×2 IMPLANT
ELECT REM PT RETURN 9FT ADLT (ELECTROSURGICAL) ×2
ELECTRODE REM PT RTRN 9FT ADLT (ELECTROSURGICAL) ×1 IMPLANT
EVACUATOR 1/8 PVC DRAIN (DRAIN) ×2 IMPLANT
GAUZE SPONGE 4X4 12PLY STRL (GAUZE/BANDAGES/DRESSINGS) ×2 IMPLANT
GLOVE BIO SURGEON STRL SZ7.5 (GLOVE) IMPLANT
GLOVE BIO SURGEON STRL SZ8 (GLOVE) ×2 IMPLANT
GLOVE BIOGEL PI IND STRL 6.5 (GLOVE) IMPLANT
GLOVE BIOGEL PI IND STRL 8 (GLOVE) ×1 IMPLANT
GLOVE BIOGEL PI INDICATOR 6.5 (GLOVE) ×1
GLOVE BIOGEL PI INDICATOR 8 (GLOVE) ×1
GLOVE SURG SS PI 6.5 STRL IVOR (GLOVE) ×1 IMPLANT
GOWN STRL REUS W/TWL LRG LVL3 (GOWN DISPOSABLE) ×3 IMPLANT
GOWN STRL REUS W/TWL XL LVL3 (GOWN DISPOSABLE) IMPLANT
HANDPIECE INTERPULSE COAX TIP (DISPOSABLE) ×2
IMMOBILIZER KNEE 20 (SOFTGOODS) ×2
IMMOBILIZER KNEE 20 THIGH 36 (SOFTGOODS) ×1 IMPLANT
MANIFOLD NEPTUNE II (INSTRUMENTS) ×2 IMPLANT
NS IRRIG 1000ML POUR BTL (IV SOLUTION) ×2 IMPLANT
PACK TOTAL KNEE CUSTOM (KITS) ×2 IMPLANT
PADDING CAST COTTON 6X4 STRL (CAST SUPPLIES) ×5 IMPLANT
POSITIONER SURGICAL ARM (MISCELLANEOUS) ×2 IMPLANT
SET HNDPC FAN SPRY TIP SCT (DISPOSABLE) ×1 IMPLANT
STRIP CLOSURE SKIN 1/2X4 (GAUZE/BANDAGES/DRESSINGS) ×4 IMPLANT
SUT MNCRL AB 4-0 PS2 18 (SUTURE) ×2 IMPLANT
SUT VIC AB 2-0 CT1 27 (SUTURE) ×6
SUT VIC AB 2-0 CT1 TAPERPNT 27 (SUTURE) ×3 IMPLANT
SUT VLOC 180 0 24IN GS25 (SUTURE) ×2 IMPLANT
SYR 50ML LL SCALE MARK (SYRINGE) ×1 IMPLANT
TRAY FOLEY W/METER SILVER 14FR (SET/KITS/TRAYS/PACK) ×2 IMPLANT
TRAY FOLEY W/METER SILVER 16FR (SET/KITS/TRAYS/PACK) ×1 IMPLANT
WATER STERILE IRR 1500ML POUR (IV SOLUTION) ×2 IMPLANT
WRAP KNEE MAXI GEL POST OP (GAUZE/BANDAGES/DRESSINGS) ×2 IMPLANT
YANKAUER SUCT BULB TIP 10FT TU (MISCELLANEOUS) ×2 IMPLANT

## 2015-11-08 NOTE — Op Note (Signed)
Pre-operative diagnosis- Osteoarthritis  Left knee(s)  Post-operative diagnosis- Osteoarthritis Left knee(s)  Procedure-  Left  Total Knee Arthroplasty  Surgeon- Dione Plover. Autry Prust, MD  Assistant- Ardeen Jourdain, PA-C   Anesthesia-  Spinal  EBL-* No blood loss amount entered *   Drains Hemovac  Tourniquet time-  Total Tourniquet Time Documented: Thigh (Left) - 29 minutes Total: Thigh (Left) - 29 minutes     Complications- None  Condition-PACU - hemodynamically stable.   Brief Clinical Note  [name] is a 75 y.o. year old female with end stage OA of her left knee with progressively worsening pain and dysfunction. She has constant pain, with activity and at rest and significant functional deficits with difficulties even with ADLs. She has had extensive non-op management including analgesics, injections of cortisone and viscosupplements, and home exercise program, but remains in significant pain with significant dysfunction. Radiographs show bone on bone arthritis medial and patellofemoral. She presents now for left Total Knee Arthroplasty.    Procedure in detail---   The patient is brought into the operating room and positioned supine on the operating table. After successful administration of  Spinal,   a tourniquet is placed high on the  Left thigh(s) and the lower extremity is prepped and draped in the usual sterile fashion. Time out is performed by the operating team and then the  Left lower extremity is wrapped in Esmarch, knee flexed and the tourniquet inflated to 300 mmHg.       A midline incision is made with a ten blade through the subcutaneous tissue to the level of the extensor mechanism. A fresh blade is used to make a medial parapatellar arthrotomy. Soft tissue over the proximal medial tibia is subperiosteally elevated to the joint line with a knife and into the semimembranosus bursa with a Cobb elevator. Soft tissue over the proximal lateral tibia is elevated with  attention being paid to avoiding the patellar tendon on the tibial tubercle. The patella is everted, knee flexed 90 degrees and the ACL and PCL are removed. Findings are bone on bone medial and patellofemoral with large global osteophytes.        The drill is used to create a starting hole in the distal femur and the canal is thoroughly irrigated with sterile saline to remove the fatty contents. The 5 degree Left  valgus alignment guide is placed into the femoral canal and the distal femoral cutting block is pinned to remove 10 mm off the distal femur. Resection is made with an oscillating saw.      The tibia is subluxed forward and the menisci are removed. The extramedullary alignment guide is placed referencing proximally at the medial aspect of the tibial tubercle and distally along the second metatarsal axis and tibial crest. The block is pinned to remove 45mm off the more deficient medial  side. Resection is made with an oscillating saw. Size 3is the most appropriate size for the tibia and the proximal tibia is prepared with the modular drill and keel punch for that size.      The femoral sizing guide is placed and size 3 is most appropriate. Rotation is marked off the epicondylar axis and confirmed by creating a rectangular flexion gap at 90 degrees. The size 3 cutting block is pinned in this rotation and the anterior, posterior and chamfer cuts are made with the oscillating saw. The intercondylar block is then placed and that cut is made.      Trial size 3 tibial component,  trial size 3 posterior stabilized femur and a 12.5  mm posterior stabilized rotating platform insert trial is placed. Full extension is achieved with excellent varus/valgus and anterior/posterior balance throughout full range of motion. The patella is everted and thickness measured to be 22  mm. Free hand resection is taken to 12 mm, a 35 template is placed, lug holes are drilled, trial patella is placed, and it tracks normally.  Osteophytes are removed off the posterior femur with the trial in place. All trials are removed and the cut bone surfaces prepared with pulsatile lavage. Cement is mixed and once ready for implantation, the size 3 tibial implant, size  3 posterior stabilized femoral component, and the size 35 patella are cemented in place and the patella is held with the clamp. The trial insert is placed and the knee held in full extension. The Exparel (20 ml mixed with 30 ml saline) and .25% Bupivicaine, are injected into the extensor mechanism, posterior capsule, medial and lateral gutters and subcutaneous tissues.  All extruded cement is removed and once the cement is hard the permanent 12.5 mm posterior stabilized rotating platform insert is placed into the tibial tray.      The wound is copiously irrigated with saline solution and the extensor mechanism closed over a hemovac drain with #1 V-loc suture. The tourniquet is released for a total tourniquet time of 29  minutes. Flexion against gravity is 140 degrees and the patella tracks normally. Subcutaneous tissue is closed with 2.0 vicryl and subcuticular with running 4.0 Monocryl. The incision is cleaned and dried and steri-strips and a bulky sterile dressing are applied. The limb is placed into a knee immobilizer and the patient is awakened and transported to recovery in stable condition.      Please note that a surgical assistant was a medical necessity for this procedure in order to perform it in a safe and expeditious manner. Surgical assistant was necessary to retract the ligaments and vital neurovascular structures to prevent injury to them and also necessary for proper positioning of the limb to allow for anatomic placement of the prosthesis.   Dione Plover Mehran Guderian, MD    11/08/2015, 2:47 PM

## 2015-11-08 NOTE — Anesthesia Postprocedure Evaluation (Signed)
Anesthesia Post Note  Patient: [name]  Procedure(s) Performed: Procedure(s) (LRB): LEFT TOTAL KNEE ARTHROPLASTY (Left)  Patient location during evaluation: PACU Anesthesia Type: Spinal Level of consciousness: awake and alert, oriented and patient cooperative Pain management: pain level controlled Vital Signs Assessment: post-procedure vital signs reviewed and stable Respiratory status: spontaneous breathing, respiratory function stable and nonlabored ventilation Cardiovascular status: blood pressure returned to baseline and stable Postop Assessment: no headache, spinal receding and patient able to bend at knees Anesthetic complications: no    Last Vitals:  Filed Vitals:   11/08/15 1645 11/08/15 1700  BP: 139/76 150/73  Pulse:  67  Temp:  36.5 C  Resp:  16    Last Pain: There were no vitals filed for this visit.               Midge Minium

## 2015-11-08 NOTE — Anesthesia Procedure Notes (Addendum)
Spinal Patient location during procedure: OR End time: 11/08/2015 2:00 PM Staffing Anesthesiologist: Annye Asa Performed by: anesthesiologist  Preanesthetic Checklist Completed: patient identified, surgical consent, pre-op evaluation, timeout performed, IV checked, risks and benefits discussed and monitors and equipment checked Spinal Block Patient position: sitting Prep: ChloraPrep Patient monitoring: heart rate, continuous pulse ox and blood pressure Approach: midline Location: L3-4 Injection technique: single-shot Needle Needle type: Quincke  Needle gauge: 25 G Needle length: 9 cm Additional Notes Pt identified in Operating room.  Monitors applied. Working IV access confirmed. Sterile prep, drape lumbar spine.  1% lido local L 3,4.  After CRNA unsuccessful, #25ga Quincke into clear CSF L 3,4.  15mg  0.75% Bupivacaine with dextrose injected with asp CSF beginning and end of injection.  Patient asymptomatic, VSS, no heme aspirated, tolerated well.  Jenita Seashore, MD   Spinal Patient location during procedure: OR End time: 11/08/2015 1:56 PM Staffing Resident/CRNA: Cynda Familia Performed by: resident/CRNA  Preanesthetic Checklist Completed: patient identified, site marked, surgical consent, pre-op evaluation, timeout performed, IV checked, risks and benefits discussed and monitors and equipment checked Spinal Block Patient position: sitting Prep: ChloraPrep and site prepped and draped Patient monitoring: heart rate, continuous pulse ox, blood pressure and cardiac monitor Approach: midline Location: L3-4 Needle Needle type: Sprotte  Needle gauge: 24 G Additional Notes Expiration date of tray noted and within date.   Patient tolerated procedure well- unsuccessful-- boneGlennon Mac present supervised and assisted and placed spinal after one attempt.

## 2015-11-08 NOTE — Transfer of Care (Signed)
Immediate Anesthesia Transfer of Care Note  Patient: [name]  Procedure(s) Performed: Procedure(s): LEFT TOTAL KNEE ARTHROPLASTY (Left)  Patient Location: PACU  Anesthesia Type:Spinal  Level of Consciousness: awake, alert , oriented and patient cooperative  Airway & Oxygen Therapy: Patient Spontanous Breathing  Post-op Assessment: Report given to RN and Post -op Vital signs reviewed and stable  Post vital signs: stable  Last Vitals:  Filed Vitals:   11/08/15 1224  BP: 137/62  Pulse: 67  Temp: 36.4 C  Resp: 16    Last Pain: There were no vitals filed for this visit.    Patients Stated Pain Goal: 3 ([id number])  Complications: No apparent anesthesia complications  T 12 spinal level

## 2015-11-08 NOTE — H&P (View-Only) (Signed)
[name] DOB: September 12, 1940 Divorced / Language: [name] / Race: White Female Date of Admission:  11/08/2015 CC:  Left Knee Pain History of Present Illness The patient is a 75 year old female who comes in for a preoperative History and Physical. The patient is scheduled for a left total knee arthroplasty to be performed by Dr. Dione Plover. Aluisio, MD at [facility] on 11-08-2015. The patient is a 75 year old female who presents with knee complaints. The patient reports left knee (worse than right) symptoms including: pain, instability, stiffness, soreness and grinding which began 5 year(s) ago without any known injury.The patient feels that the symptoms are worsening. The patient has the current diagnosis of knee osteoarthritis. Prior to being seen today the patient was previously evaluated by a colleague (Dr. Patrecia Pour). Previous work-up for this problem has included knee x-rays and orthopedic consultation (surgery consult with Dr. Durward Fortes). Past treatment for this problem has included intra-articular injection of corticosteroids as well as viscosupplementation in the left knee. She has not yet been treated for the right knee. She states that the injections do help temporarily, but she would to proceed with surgery. She has several friends that had surgery by Dr. Wynelle Link, and decided to come here. Current treatment includes opioid analgesics (tramadol). She is now having pain at all times, worse with weightbearing. She is having increased right knee pain because of compensating for the left knee. No associated groin or radicular pain. Unfortunately, her left knee has gotten progressively worse over time. She says it is hurting in all times now. It hurts day and night. It keeps her awake at night. It is limiting what she can and cannot do. She has had cortisone injections and multiple series of viscosupplement injections, which are no longer beneficial. She is ready to proceed with surgery at this  time. They have been treated conservatively in the past for the above stated problem and despite conservative measures, they continue to have progressive pain and severe functional limitations and dysfunction. They have failed non-operative management including home exercise, medications, and injections. It is felt that they would benefit from undergoing total joint replacement. Risks and benefits of the procedure have been discussed with the patient and they elect to proceed with surgery. There are no active contraindications to surgery such as ongoing infection or rapidly progressive neurological disease.  Problem List/Past Medical Primary osteoarthritis of both knees (M17.0)  Osteoarthritis of left knee (M17.12)  Chronic Pain  Fibromyalgia  Heart murmur  High blood pressure  Hypercholesterolemia  Osteoarthritis  Skin Cancer  Urinary tract infection in female (N39.0)  Tuberculosis  Varicose veins  Measles  Allergies  Lisinopril *ANTIHYPERTENSIVES*  drop in sodium SulfADIAZINE *Sulfonamides**  Rash.   Family History  Cancer  mother and father Cerebrovascular Accident  sister Osteoarthritis  mother, father and sister  Social History  Alcohol use  current drinker; drinks wine; 5-7 per week Children  2 Current work status  retired Engineer, agricultural (Currently)  no Drug/Alcohol Rehab (Previously)  no Exercise  Exercises daily; does running / walking and gym / weights Illicit drug use  no Living situation  live alone Marital status  divorced Number of flights of stairs before winded  less than 1 Pain Contract  no Tobacco use  never smoker Psychologist, prison and probation services Will, Healthcare POA  Medication History  TraMADol HCl (50MG  Tablet, Oral) Active. Voltaren (1% Gel, Transdermal) Active. AmLODIPine Besylate (5MG  Tablet, Oral) Active. Atenolol (25MG  Tablet, Oral) Active. Lyrica (Oral) Specific strength  unknown - Active. Simvastatin (Oral)  Specific strength unknown - Active. Ambien (Oral) Specific strength unknown - Active. Gabapentin (Oral) Specific strength unknown - Active. DiazePAM (Oral) Specific strength unknown - Active.  Past Surgical History Tonsillectomy  Tubal Ligation  Date: 86.    Review of Systems General Not Present- Chills, Fatigue, Fever, Memory Loss, Night Sweats, Weight Gain and Weight Loss. Skin Not Present- Eczema, Hives, Itching, Lesions and Rash. HEENT Not Present- Dentures, Double Vision, Headache, Hearing Loss, Tinnitus and Visual Loss. Respiratory Present- Shortness of breath with exertion. Not Present- Allergies, Chronic Cough, Coughing up blood and Shortness of breath at rest. Cardiovascular Present- Swelling. Not Present- Chest Pain, Difficulty Breathing Lying Down, Murmur, Palpitations and Racing/skipping heartbeats. Gastrointestinal Not Present- Abdominal Pain, Bloody Stool, Constipation, Diarrhea, Difficulty Swallowing, Heartburn, Jaundice, Loss of appetitie, Nausea and Vomiting. Female Genitourinary Not Present- Blood in Urine, Discharge, Flank Pain, Incontinence, Painful Urination, Urgency, Urinary frequency, Urinary Retention, Urinating at Night and Weak urinary stream. Musculoskeletal Present- Back Pain, Joint Pain, Muscle Pain and Muscle Weakness. Not Present- Joint Swelling, Morning Stiffness and Spasms. Neurological Present- Tremor. Not Present- Blackout spells, Difficulty with balance, Dizziness, Paralysis and Weakness. Psychiatric Present- Insomnia.  Vitals Weight: 155 lb Height: 66in Height was reported by patient. Body Surface Area: 1.79 m Body Mass Index: 25.02 kg/m  BP: 132/60 (Sitting, Left Arm, Standard)  Physical Exam General Mental Status -Alert, cooperative and good historian. General Appearance-pleasant, Not in acute distress. Orientation-Oriented X3. Build & Nutrition-Well nourished and Well developed.  Head and Neck Head-normocephalic,  atraumatic . Neck Global Assessment - supple, no bruit auscultated on the right, no bruit auscultated on the left.  Eye Pupil - Bilateral-Regular and Round. Motion - Bilateral-EOMI.  Chest and Lung Exam Auscultation Breath sounds - clear at anterior chest wall and clear at posterior chest wall. Adventitious sounds - No Adventitious sounds.  Cardiovascular Auscultation Rhythm - Regular rate and rhythm. Heart Sounds - S1 WNL and S2 WNL. Murmurs & Other Heart Sounds - Auscultation of the heart reveals - No Murmurs.  Abdomen Palpation/Percussion Tenderness - Abdomen is non-tender to palpation. Rigidity (guarding) - Abdomen is soft. Auscultation Auscultation of the abdomen reveals - Bowel sounds normal.  Female Genitourinary Note: Not done, not pertinent to present illness   Musculoskeletal Note: On exam, very pleasant, well-developed female, alert and oriented, in no apparent distress. Both hips show normal range of motion with no discomfort. Her left knee shows no effusion. Range about 5 to 125 degrees. She is very tender on the medial joint line, slight lateral tenderness also. There is no instability noted. Right hip has normal range of motion, no discomfort. Right knee shows no effusion. Range 0 to 125. Slight crepitus in range of motion. Slight tenderness medial greater than lateral, no instability. Pulse, sensation and motor are intact.  IMAGING Radiographs, AP and lateral both knees show that she has bone on bone arthritis in the medial and patellofemoral compartments of the left knee. She also has arthritic change in her right, but not as advanced.  Assessment & Plan Osteoarthritis of left knee (M17.12)  Note:Surgical Plans: Left Total Knee Replacement  Disposition: Home with Daughter  PCP: Dr. Kelton Pillar  IV TXA  Anesthesia Issues: None  Signed electronically by Ok Edwards, III PA-C

## 2015-11-08 NOTE — Anesthesia Preprocedure Evaluation (Addendum)
Anesthesia Evaluation  Patient identified by MRN, date of birth, ID band Patient awake    Reviewed: Allergy & Precautions, NPO status , Patient's Chart, lab work & pertinent test results, reviewed documented beta blocker date and time   History of Anesthesia Complications Negative for: history of anesthetic complications  Airway Mallampati: II  TM Distance: >3 FB Neck ROM: Full    Dental  (+) Dental Advisory Given, Implants, Caps   Pulmonary neg pulmonary ROS,    breath sounds clear to auscultation       Cardiovascular hypertension, Pt. on medications and Pt. on home beta blockers (-) angina Rhythm:Regular Rate:Normal  '11 ECHO: EF 60-65%, valves OK '15 Stress test: normal LVF, no ischemia   Neuro/Psych Chronic low back pain Occipital neuralgia negative psych ROS   GI/Hepatic Neg liver ROS, GERD  Controlled,  Endo/Other  negative endocrine ROS  Renal/GU negative Renal ROS     Musculoskeletal  (+) Arthritis , Osteoarthritis,  Fibromyalgia -, narcotic dependent  Abdominal   Peds  Hematology negative hematology ROS (+)   Anesthesia Other Findings   Reproductive/Obstetrics                          Anesthesia Physical Anesthesia Plan  ASA: II  Anesthesia Plan: Spinal   Post-op Pain Management:    Induction:   Airway Management Planned: Simple Face Mask and Natural Airway  Additional Equipment:   Intra-op Plan:   Post-operative Plan:   Informed Consent: I have reviewed the patients History and Physical, chart, labs and discussed the procedure including the risks, benefits and alternatives for the proposed anesthesia with the patient or authorized representative who has indicated his/her understanding and acceptance.   Dental advisory given  Plan Discussed with: CRNA and Surgeon  Anesthesia Plan Comments: (Plan routine monitors, SAB)        Anesthesia Quick Evaluation

## 2015-11-08 NOTE — Interval H&P Note (Signed)
History and Physical Interval Note:  11/08/2015 12:48 PM  [name]  has presented today for surgery, with the diagnosis of left knee osteoarthritis  The various methods of treatment have been discussed with the patient and family. After consideration of risks, benefits and other options for treatment, the patient has consented to  Procedure(s): LEFT TOTAL KNEE ARTHROPLASTY (Left) as a surgical intervention .  The patient's history has been reviewed, patient examined, no change in status, stable for surgery.  I have reviewed the patient's chart and labs.  Questions were answered to the patient's satisfaction.     Gearlean Alf

## 2015-11-09 DIAGNOSIS — M179 Osteoarthritis of knee, unspecified: Secondary | ICD-10-CM | POA: Diagnosis not present

## 2015-11-09 LAB — BASIC METABOLIC PANEL
Anion gap: 7 (ref 5–15)
BUN: 7 mg/dL (ref 6–20)
CHLORIDE: 99 mmol/L — AB (ref 101–111)
CO2: 24 mmol/L (ref 22–32)
CREATININE: 0.81 mg/dL (ref 0.44–1.00)
Calcium: 8.6 mg/dL — ABNORMAL LOW (ref 8.9–10.3)
GFR calc non Af Amer: >60 mL/min (ref >60)
GLUCOSE: 246 mg/dL — AB (ref 65–99)
Potassium: 3.6 mmol/L (ref 3.5–5.1)
SODIUM: 130 mmol/L — AB (ref 135–145)

## 2015-11-09 LAB — CBC
HCT: 33 % — ABNORMAL LOW (ref 36.0–46.0)
HEMOGLOBIN: 11.5 g/dL — AB (ref 12.0–15.0)
MCH: 33.2 pg (ref 26.0–34.0)
MCHC: 34.8 g/dL (ref 30.0–36.0)
MCV: 95.4 fL (ref 78.0–100.0)
PLATELETS: 191 10*3/uL (ref 150–400)
RBC: 3.46 MIL/uL — ABNORMAL LOW (ref 3.87–5.11)
RDW: 13.7 % (ref 11.5–15.5)
WBC: 8.8 10*3/uL (ref 4.0–10.5)

## 2015-11-09 MED ORDER — ZOLPIDEM TARTRATE 5 MG PO TABS
5.0000 mg | ORAL_TABLET | Freq: Every evening | ORAL | Status: DC | PRN
  Administered 2015-11-09: 5 mg via ORAL
  Filled 2015-11-09: qty 1

## 2015-11-09 MED ORDER — RIVAROXABAN 10 MG PO TABS: 10.0000 mg | ORAL_TABLET | Freq: Every day | ORAL | Status: DC

## 2015-11-09 MED ORDER — METHOCARBAMOL 500 MG PO TABS: 500.0000 mg | ORAL_TABLET | Freq: Four times a day (QID) | ORAL | Status: DC | PRN

## 2015-11-09 MED ORDER — OXYCODONE HCL 5 MG PO TABS: 5.0000 mg | ORAL_TABLET | ORAL | Status: DC | PRN

## 2015-11-09 MED ORDER — OXYCODONE HCL 5 MG PO TABS
5.0000 mg | ORAL_TABLET | ORAL | Status: DC | PRN
  Administered 2015-11-09 – 2015-11-10 (×8): 10 mg via ORAL
  Filled 2015-11-09 (×8): qty 2

## 2015-11-09 MED ORDER — TRAMADOL HCL 50 MG PO TABS: 50.0000 mg | ORAL_TABLET | Freq: Four times a day (QID) | ORAL | Status: DC | PRN

## 2015-11-09 MED ORDER — HYDROMORPHONE HCL 2 MG PO TABS: 4.0000 mg | ORAL_TABLET | ORAL | Status: DC | PRN

## 2015-11-09 NOTE — Progress Notes (Signed)
   Subjective: 1 Day Post-Op Procedure(s) (LRB): LEFT TOTAL KNEE ARTHROPLASTY (Left) Patient reports pain as mild.   Patient seen in rounds with Dr. Wynelle Link.  Doing well this morning. Family in room. Patient is well, and has had no acute complaints or problems We will start therapy today.  Plan is to go Home after hospital stay.  Objective: Vital signs in last 24 hours: Temp:  [96.6 F (35.9 C)-98.3 F (36.8 C)] 98.2 F (36.8 C) (05/02 0539) Pulse Rate:  [67-93] 72 (05/02 0539) Resp:  [16-18] 16 (05/02 0539) BP: (116-150)/(56-87) 121/57 mmHg (05/02 0539) SpO2:  [96 %-99 %] 99 % (05/02 0539) Weight:  [71.215 kg (157 lb)] 71.215 kg (157 lb) (05/01 1228)  Intake/Output from previous day:  Intake/Output Summary (Last 24 hours) at 11/09/15 0734 Last data filed at 11/09/15 0500  Gross per 24 hour  Intake 3098.33 ml  Output   3705 ml  Net -606.67 ml    Intake/Output this shift: UOP 600  Labs:  Recent Labs  11/09/15 0416  HGB 11.5*    Recent Labs  11/09/15 0416  WBC 8.8  RBC 3.46*  HCT 33.0*  PLT 191    Recent Labs  11/09/15 0416  NA 130*  K 3.6  CL 99*  CO2 24  BUN 7  CREATININE 0.81  GLUCOSE 246*  CALCIUM 8.6*   No results for input(s): LABPT, INR in the last 72 hours.  EXAM General - Patient is Alert, Appropriate and Oriented Extremity - Neurovascular intact Sensation intact distally Dorsiflexion/Plantar flexion intact Dressing - dressing C/D/I Motor Function - intact, moving foot and toes well on exam.  Hemovac pulled without difficulty.  Past Medical History  Diagnosis Date  . Essential hypertension, benign   . Fibromyalgia   . Pure hypercholesterolemia 12/16/2013  . Osteoarthritis   . Low back pain     facet joint pain  . Benign essential tremor   . Hyponatremia     secondary to hctz-recurrent March 2010  . Hemorrhoids   . Osteopenia   . GERD (gastroesophageal reflux disease)   . Occipital neuralgia   . Chronic kidney disease   .  Heart murmur   . Shortness of breath dyspnea   . Tuberculosis     as a child  . Cancer ([place])     h/o skin cancer    Assessment/Plan: 1 Day Post-Op Procedure(s) (LRB): LEFT TOTAL KNEE ARTHROPLASTY (Left) Principal Problem:   OA (osteoarthritis) of knee  Estimated body mass index is 26.13 kg/(m^2) as calculated from the following:   Height as of this encounter: 5\' 5"  (1.651 m).   Weight as of this encounter: 71.215 kg (157 lb). Advance diet Up with therapy Plan for discharge tomorrow Discharge home with home health  DVT Prophylaxis - Xarelto Weight-Bearing as tolerated to left leg D/C O2 and Pulse OX and try on Room Air  Arlee Muslim, PA-C Orthopaedic Surgery 11/09/2015, 7:34 AM

## 2015-11-09 NOTE — Progress Notes (Signed)
Physical Therapy Treatment Note    11/09/15 1600  PT Visit Information  Last PT Received On 11/09/15  Assistance Needed +1  History of Present Illness Pt is a 75 year old female s/p L TKA  Subjective Data  Subjective Pt ambulated in hallway and performed heel slides before returning to bed.  Precautions  Precautions Fall;Knee  Required Braces or Orthoses Knee Immobilizer - Left  Restrictions  Other Position/Activity Restrictions WBAT  Pain Assessment  Pain Assessment 0-10  Pain Score 4  Pain Location L knee  Pain Descriptors / Indicators Aching;Sore  Pain Intervention(s) Limited activity within patient's tolerance;Repositioned;Monitored during session;Ice applied  Cognition  Arousal/Alertness Awake/alert  Behavior During Therapy WFL for tasks assessed/performed  Overall Cognitive Status Within Functional Limits for tasks assessed  Bed Mobility  Overal bed mobility Needs Assistance  Bed Mobility Supine to Sit;Sit to Supine  Supine to sit Min assist  Sit to supine Min assist  General bed mobility comments assist for L LE  Transfers  Overall transfer level Needs assistance  Equipment used Rolling walker (2 wheeled)  Transfers Sit to/from Stand  Sit to Stand Min guard  General transfer comment verbal cues for UE and LE positioning  Ambulation/Gait  Ambulation/Gait assistance Min guard  Ambulation Distance (Feet) 80 Feet  Assistive device Standard walker  Gait Pattern/deviations Step-to pattern;Antalgic  General Gait Details verbal cues for sequence, RW positioning, step length, posture  Exercises  Exercises Total Joint  Total Joint Exercises  Heel Slides AAROM;Left;10 reps;Seated  Goniometric ROM approx 30* AAROM knee flexion sitting at EOB  PT - End of Session  Equipment Utilized During Treatment Left knee immobilizer  Activity Tolerance Patient tolerated treatment well  Patient left in bed;with call bell/phone within reach;with bed alarm set  PT - Assessment/Plan   PT Plan Current plan remains appropriate  PT Frequency (ACUTE ONLY) 7X/week  Follow Up Recommendations Home health PT;Supervision for mobility/OOB  PT equipment None recommended by PT  PT Goal Progression  Progress towards PT goals Progressing toward goals  PT Time Calculation  PT Start Time (ACUTE ONLY) 1345  PT Stop Time (ACUTE ONLY) 1410  PT Time Calculation (min) (ACUTE ONLY) 25 min  PT General Charges  $$ ACUTE PT VISIT 1 Procedure  PT Treatments  $Gait Training 8-22 mins  $Therapeutic Activity 8-22 mins   Carmelia Bake, PT, DPT 11/09/2015 Pager: [phone]

## 2015-11-09 NOTE — Discharge Instructions (Addendum)
° °Dr. Frank Aluisio °Total Joint Specialist °[facility] °[address] °[place], [place] [address] °[phone] ° °TOTAL KNEE REPLACEMENT POSTOPERATIVE DIRECTIONS ° °Knee Rehabilitation, Guidelines Following Surgery  °Results after knee surgery are often greatly improved when you follow the exercise, range of motion and muscle strengthening exercises prescribed by your doctor. Safety measures are also important to protect the knee from further injury. Any time any of these exercises cause you to have increased pain or swelling in your knee joint, decrease the amount until you are comfortable again and slowly increase them. If you have problems or questions, call your caregiver or physical therapist for advice.  ° °HOME CARE INSTRUCTIONS  °Remove items at home which could result in a fall. This includes throw rugs or furniture in walking pathways.  °· ICE to the affected knee every three hours for 30 minutes at a time and then as needed for pain and swelling.  Continue to use ice on the knee for pain and swelling from surgery. You may notice swelling that will progress down to the foot and ankle.  This is normal after surgery.  Elevate the leg when you are not up walking on it.   °· Continue to use the breathing machine which will help keep your temperature down.  It is common for your temperature to cycle up and down following surgery, especially at night when you are not up moving around and exerting yourself.  The breathing machine keeps your lungs expanded and your temperature down. °· Do not place pillow under knee, focus on keeping the knee straight while resting ° °DIET °You may resume your previous home diet once your are discharged from the hospital. ° °DRESSING / WOUND CARE / SHOWERING °You may shower 3 days after surgery, but keep the wounds dry during showering.  You may use an occlusive plastic wrap (Press'n Seal for example), NO SOAKING/SUBMERGING IN THE BATHTUB.  If the  bandage gets wet, change with a clean dry gauze.  If the incision gets wet, pat the wound dry with a clean towel. °You may start showering once you are discharged home but do not submerge the incision under water. Just pat the incision dry and apply a dry gauze dressing on daily. °Change the surgical dressing daily and reapply a dry dressing each time. ° °ACTIVITY °Walk with your walker as instructed. °Use walker as long as suggested by your caregivers. °Avoid periods of inactivity such as sitting longer than an hour when not asleep. This helps prevent blood clots.  °You may resume a sexual relationship in one month or when given the OK by your doctor.  °You may return to work once you are cleared by your doctor.  °Do not drive a car for 6 weeks or until released by you surgeon.  °Do not drive while taking narcotics. ° °WEIGHT BEARING °Weight bearing as tolerated with assist device (walker, cane, etc) as directed, use it as long as suggested by your surgeon or therapist, typically at least 4-6 weeks. ° °POSTOPERATIVE CONSTIPATION PROTOCOL °Constipation - defined medically as fewer than three stools per week and severe constipation as less than one stool per week. ° °One of the most common issues patients have following surgery is constipation.  Even if you have a regular bowel pattern at home, your normal regimen is likely to be disrupted due to multiple reasons following surgery.  Combination of anesthesia, postoperative narcotics, change in appetite and fluid intake all can affect your bowels.    In order to avoid complications following surgery, here are some recommendations in order to help you during your recovery period. ° °Colace (docusate) - Pick up an over-the-counter form of Colace or another stool softener and take twice a day as long as you are requiring postoperative pain medications.  Take with a full glass of water daily.  If you experience loose stools or diarrhea, hold the colace until you stool forms  back up.  If your symptoms do not get better within 1 week or if they get worse, check with your doctor. ° °Dulcolax (bisacodyl) - Pick up over-the-counter and take as directed by the product packaging as needed to assist with the movement of your bowels.  Take with a full glass of water.  Use this product as needed if not relieved by Colace only.  ° °MiraLax (polyethylene glycol) - Pick up over-the-counter to have on hand.  MiraLax is a solution that will increase the amount of water in your bowels to assist with bowel movements.  Take as directed and can mix with a glass of water, juice, soda, coffee, or tea.  Take if you go more than two days without a movement. °Do not use MiraLax more than once per day. Call your doctor if you are still constipated or irregular after using this medication for 7 days in a row. ° °If you continue to have problems with postoperative constipation, please contact the office for further assistance and recommendations.  If you experience "the worst abdominal pain ever" or develop nausea or vomiting, please contact the office immediatly for further recommendations for treatment. ° °ITCHING ° If you experience itching with your medications, try taking only a single pain pill, or even half a pain pill at a time.  You can also use Benadryl over the counter for itching or also to help with sleep.  ° °TED HOSE STOCKINGS °Wear the elastic stockings on both legs for three weeks following surgery during the day but you may remove then at night for sleeping. ° °MEDICATIONS °See your medication summary on the “After Visit Summary” that the nursing staff will review with you prior to discharge.  You may have some home medications which will be placed on hold until you complete the course of blood thinner medication.  It is important for you to complete the blood thinner medication as prescribed by your surgeon.  Continue your approved medications as instructed at time of  discharge. ° °PRECAUTIONS °If you experience chest pain or shortness of breath - call 911 immediately for transfer to the hospital emergency department.  °If you develop a fever greater that 101 F, purulent drainage from wound, increased redness or drainage from wound, foul odor from the wound/dressing, or calf pain - CONTACT YOUR SURGEON.   °                                                °FOLLOW-UP APPOINTMENTS °Make sure you keep all of your appointments after your operation with your surgeon and caregivers. You should call the office at the above phone number and make an appointment for approximately two weeks after the date of your surgery or on the date instructed by your surgeon outlined in the "After Visit Summary". ° ° °RANGE OF MOTION AND STRENGTHENING EXERCISES  °Rehabilitation of the knee is important following a knee injury or   an operation. After just a few days of immobilization, the muscles of the thigh which control the knee become weakened and shrink (atrophy). Knee exercises are designed to build up the tone and strength of the thigh muscles and to improve knee motion. Often times heat used for twenty to thirty minutes before working out will loosen up your tissues and help with improving the range of motion but do not use heat for the first two weeks following surgery. These exercises can be done on a training (exercise) mat, on the floor, on a table or on a bed. Use what ever works the best and is most comfortable for you Knee exercises include:  °Leg Lifts - While your knee is still immobilized in a splint or cast, you can do straight leg raises. Lift the leg to 60 degrees, hold for 3 sec, and slowly lower the leg. Repeat 10-20 times 2-3 times daily. Perform this exercise against resistance later as your knee gets better.  °Quad and Hamstring Sets - Tighten up the muscle on the front of the thigh (Quad) and hold for 5-10 sec. Repeat this 10-20 times hourly. Hamstring sets are done by pushing the  foot backward against an object and holding for 5-10 sec. Repeat as with quad sets.  °· Leg Slides: Lying on your back, slowly slide your foot toward your buttocks, bending your knee up off the floor (only go as far as is comfortable). Then slowly slide your foot back down until your leg is flat on the floor again. °· Angel Wings: Lying on your back spread your legs to the side as far apart as you can without causing discomfort.  °A rehabilitation program following serious knee injuries can speed recovery and prevent re-injury in the future due to weakened muscles. Contact your doctor or a physical therapist for more information on knee rehabilitation.  ° °IF YOU ARE TRANSFERRED TO A SKILLED REHAB FACILITY °If the patient is transferred to a skilled rehab facility following release from the hospital, a list of the current medications will be sent to the facility for the patient to continue.  When discharged from the skilled rehab facility, please have the facility set up the patient's Home Health Physical Therapy prior to being released. Also, the skilled facility will be responsible for providing the patient with their medications at time of release from the facility to include their pain medication, the muscle relaxants, and their blood thinner medication. If the patient is still at the rehab facility at time of the two week follow up appointment, the skilled rehab facility will also need to assist the patient in arranging follow up appointment in our office and any transportation needs. ° °MAKE SURE YOU:  °Understand these instructions.  °Get help right away if you are not doing well or get worse.  ° ° °Pick up stool softner and laxative for home use following surgery while on pain medications. °Do not submerge incision under water. °Please use good hand washing techniques while changing dressing each day. °May shower starting three days after surgery. °Please use a clean towel to pat the incision dry following  showers. °Continue to use ice for pain and swelling after surgery. °Do not use any lotions or creams on the incision until instructed by your surgeon. ° °Take Xarelto for two and a half more weeks, then discontinue Xarelto. °Once the patient has completed the blood thinner regimen, then take a Baby 81 mg Aspirin daily for three more weeks. ° ° °Information   on my medicine - XARELTO® (Rivaroxaban) ° °This medication education was reviewed with me or my healthcare representative as part of my discharge preparation.  The pharmacist that spoke with me during my hospital stay was:  Glogovac,nikola, RPH ° °Why was Xarelto® prescribed for you? °Xarelto® was prescribed for you to reduce the risk of blood clots forming after orthopedic surgery. The medical term for these abnormal blood clots is venous thromboembolism (VTE). ° °What do you need to know about xarelto® ? °Take your Xarelto® ONCE DAILY at the same time every day. °You may take it either with or without food. ° °If you have difficulty swallowing the tablet whole, you may crush it and mix in applesauce just prior to taking your dose. ° °Take Xarelto® exactly as prescribed by your doctor and DO NOT stop taking Xarelto® without talking to the doctor who prescribed the medication.  Stopping without other VTE prevention medication to take the place of Xarelto® may increase your risk of developing a clot. ° °After discharge, you should have regular check-up appointments with your healthcare provider that is prescribing your Xarelto®.   ° °What do you do if you miss a dose? °If you miss a dose, take it as soon as you remember on the same day then continue your regularly scheduled once daily regimen the next day. Do not take two doses of Xarelto® on the same day.  ° °Important Safety Information °A possible side effect of Xarelto® is bleeding. You should call your healthcare provider right away if you experience any of the following: °? Bleeding from an injury or your  nose that does not stop. °? Unusual colored urine (red or dark brown) or unusual colored stools (red or black). °? Unusual bruising for unknown reasons. °? A serious fall or if you hit your head (even if there is no bleeding). ° °Some medicines may interact with Xarelto® and might increase your risk of bleeding while on Xarelto®. To help avoid this, consult your healthcare provider or pharmacist prior to using any new prescription or non-prescription medications, including herbals, vitamins, non-steroidal anti-inflammatory drugs (NSAIDs) and supplements. ° °This website has more information on Xarelto®: www.xarelto.com. ° ° °

## 2015-11-09 NOTE — Evaluation (Signed)
Occupational Therapy Evaluation Patient Details Name: [name] MRN: [id number] DOB: 01-May-1941 Today's Date: 11/09/2015    History of Present Illness Pt is a 75 year old female s/p L TKA   Clinical Impression   Pt was admitted for the above surgery. All education was completed.  No further OT is needed at this time    Follow Up Recommendations  No OT follow up;Supervision/Assistance - 24 hour    Equipment Recommendations  3 in 1 bedside comode (ordered)    Recommendations for Other Services       Precautions / Restrictions Precautions Precautions: Fall;Knee Required Braces or Orthoses: Knee Immobilizer - Left Restrictions Weight Bearing Restrictions: No Other Position/Activity Restrictions: WBAT      Mobility Bed Mobility Overal bed mobility: Needs Assistance Bed Mobility: Supine to Sit          General bed mobility comments: oob  Transfers Overall transfer level: Needs assistance Equipment used: Rolling walker (2 wheeled) Transfers: Sit to/from Stand Sit to Stand: Min guard         General transfer comment: for safety. Cues for LE/UE placement when sitting to control descent    Balance                                            ADL Overall ADL's : Needs assistance/impaired             Lower Body Bathing: Minimal assistance;Sit to/from stand       Lower Body Dressing: Minimal assistance;Sit to/from stand                 General ADL Comments: pt can perform UB adls with set up.  She had recently used bathroom.  Daughter will assist with adls as needed.  Pt's concern was tub.  Explained tub bench vs sponge bathing and pt was wondering about straddling a chair in the tub. Educated that she could use 3:1 positioned sideways, and completely in tub, facing long side of tub.  Then she would back up and sit on this and daughter would place something under feet  To support them on outside. She has a hand-held shower, but she  would also benefit from an inexpensive shower curtain liner with slits to straddle each thigh to help manage the water. Daughter will call her insurance to see if tub bench is covered; if not they will use 3:1 as described.  Reviewed precautions.  Pt is used to a small pillow under her knee and reinforced that this is not allowed following sx.     Vision     Perception     Praxis      Pertinent Vitals/Pain Pain Assessment: 0-10 Pain Score: 5  Pain Location: L knee Pain Descriptors / Indicators: Aching Pain Intervention(s): Limited activity within patient's tolerance;Monitored during session;Premedicated before session;Repositioned;Ice applied     Hand Dominance     Extremity/Trunk Assessment Upper Extremity Assessment Upper Extremity Assessment: Overall WFL for tasks assessed          Communication Communication Communication: No difficulties   Cognition Arousal/Alertness: Awake/alert Behavior During Therapy: WFL for tasks assessed/performed Overall Cognitive Status: Within Functional Limits for tasks assessed                     General Comments       Exercises  Shoulder Instructions      Home Living Family/patient expects to be discharged to:: Private residence Living Arrangements: Alone Available Help at Discharge: Family;Available 24 hours/day Type of Home: House Home Access: Stairs to enter [employer] of Steps: 1 and 1 Entrance Stairs-Rails: None Home Layout: One level     Bathroom Shower/Tub: Tub/shower unit Shower/tub characteristics: Curtain Biochemist, clinical: Standard     Home Equipment: Environmental consultant - 2 wheels   Additional Comments: 3:1 was ordered for pt.  Daughter, who is a Marine scientist, will stay with pt      Prior Functioning/Environment Level of Independence: Independent             OT Diagnosis: Acute pain   OT Problem List:     OT Treatment/Interventions:      OT Goals(Current goals can be found in the care plan  section) Acute Rehab OT Goals Patient Stated Goal: be able to take a shower OT Goal Formulation: All assessment and education complete, DC therapy  OT Frequency:     Barriers to D/C:            Co-evaluation              End of Session CPM Left Knee CPM Left Knee: Off  Activity Tolerance: Patient tolerated treatment well Patient left: in chair;with call bell/phone within reach   Time: [id number] OT Time Calculation (min): 21 min Charges:  OT General Charges $OT Visit: 1 Procedure OT Evaluation $OT Eval Low Complexity: 1 Procedure G-Codes:    Jilliam Bellmore 12/01/2015, 11:04 AM  Lesle Chris, OTR/L [phone] Dec 01, 2015

## 2015-11-09 NOTE — Evaluation (Signed)
Physical Therapy Evaluation Patient Details Name: [name] MRN: [id number] DOB: Jul 29, 1940 Today's Date: 11/09/2015   History of Present Illness  Pt is a 75 year old female s/p L TKA  Clinical Impression  Pt is s/p L TKA resulting in the deficits listed below (see PT Problem List).  Pt will benefit from skilled PT to increase their independence and safety with mobility to allow discharge to the venue listed below.  Pt assisted with ambulating in hallway and performed LE exercises.  Pt requires increased time and prefers specific directions for tasks prior to performing.  Pt plans to d/c home with assist from her daughter.     Follow Up Recommendations Home health PT;Supervision for mobility/OOB    Equipment Recommendations       Recommendations for Other Services       Precautions / Restrictions Precautions Precautions: Fall;Knee Required Braces or Orthoses: Knee Immobilizer - Left Restrictions Weight Bearing Restrictions: No Other Position/Activity Restrictions: WBAT      Mobility  Bed Mobility Overal bed mobility: Needs Assistance Bed Mobility: Supine to Sit     Supine to sit: Min assist;HOB elevated     General bed mobility comments: verbal cues for technique, assist for L LE  Transfers Overall transfer level: Needs assistance Equipment used: Rolling walker (2 wheeled) Transfers: Sit to/from Stand Sit to Stand: Min assist         General transfer comment: verbal cues for UE and LE positioning  Ambulation/Gait Ambulation/Gait assistance: Min guard Ambulation Distance (Feet): 80 Feet Assistive device: Rolling walker (2 wheeled) Gait Pattern/deviations: Step-to pattern;Antalgic;Decreased stance time - left     General Gait Details: verbal cues for sequence, RW positioning, step length, posture  Stairs            Wheelchair Mobility    Modified Rankin (Stroke Patients Only)       Balance                                              Pertinent Vitals/Pain Pain Assessment: 0-10 Pain Score: 5  Pain Location: L knee Pain Descriptors / Indicators: Aching;Sore Pain Intervention(s): Limited activity within patient's tolerance;Monitored during session;Repositioned;Premedicated before session;Ice applied    Home Living Family/patient expects to be discharged to:: Private residence Living Arrangements: Alone Available Help at Discharge: Family;Available 24 hours/day (daughter) Type of Home: House Home Access: Stairs to enter Entrance Stairs-Rails: None Entrance Stairs-Number of Steps: 1 and 1 Home Layout: One level Home Equipment: Walker - 2 wheels      Prior Function Level of Independence: Independent               Hand Dominance        Extremity/Trunk Assessment               Lower Extremity Assessment: LLE deficits/detail   LLE Deficits / Details: fair quad contraction, unable to perform SLR, ROM TBA     Communication   Communication: No difficulties  Cognition Arousal/Alertness: Awake/alert Behavior During Therapy: WFL for tasks assessed/performed Overall Cognitive Status: Within Functional Limits for tasks assessed                      General Comments      Exercises Total Joint Exercises Ankle Circles/Pumps: AROM;Both;10 reps Quad Sets: AROM;Both;10 reps Short Arc QuadSinclair Ship;Left;10 reps Hip ABduction/ADduction: AROM;Left;10 reps  Assessment/Plan    PT Assessment Patient needs continued PT services  PT Diagnosis Difficulty walking;Acute pain   PT Problem List Decreased strength;Decreased range of motion;Decreased mobility;Pain;Decreased knowledge of precautions;Decreased knowledge of use of DME  PT Treatment Interventions DME instruction;Gait training;Functional mobility training;Stair training;Patient/family education;Therapeutic activities;Therapeutic exercise   PT Goals (Current goals can be found in the Care Plan section) Acute Rehab PT Goals PT  Goal Formulation: With patient Time For Goal Achievement: 11/13/15 Potential to Achieve Goals: Good    Frequency 7X/week   Barriers to discharge        Co-evaluation               End of Session Equipment Utilized During Treatment: Gait belt;Left knee immobilizer Activity Tolerance: Patient limited by fatigue;Patient limited by pain Patient left: with family/visitor present (bathroom with daughter)           Time: [id number] PT Time Calculation (min) (ACUTE ONLY): 30 min   Charges:   PT Evaluation $PT Eval Low Complexity: 1 Procedure PT Treatments $Therapeutic Exercise: 8-22 mins   PT G Codes:        Kaneshia Cater,KATHrine E 11/09/2015, 10:29 AM Carmelia Bake, PT, DPT 11/09/2015 Pager: [phone]

## 2015-11-09 NOTE — Discharge Summary (Signed)
Physician Discharge Summary   Patient ID: [name] MRN: [id number] DOB/AGE: 1941-05-20 75 y.o.  Admit date: 11/08/2015 Discharge date: 11-10-15  Primary Diagnosis:  Osteoarthritis Left knee(s) Admission Diagnoses:  Past Medical History  Diagnosis Date  . Essential hypertension, benign   . Fibromyalgia   . Pure hypercholesterolemia 12/16/2013  . Osteoarthritis   . Low back pain     facet joint pain  . Benign essential tremor   . Hyponatremia     secondary to hctz-recurrent March 2010  . Hemorrhoids   . Osteopenia   . GERD (gastroesophageal reflux disease)   . Occipital neuralgia   . Chronic kidney disease   . Heart murmur   . Shortness of breath dyspnea   . Tuberculosis     as a child  . Cancer [facility])     h/o skin cancer   Discharge Diagnoses:   Principal Problem:   OA (osteoarthritis) of knee  Estimated body mass index is 26.13 kg/(m^2) as calculated from the following:   Height as of this encounter: 5' 5"  (1.651 m).   Weight as of this encounter: 71.215 kg (157 lb).  Procedure:  Procedure(s) (LRB): LEFT TOTAL KNEE ARTHROPLASTY (Left)   Consults: None  HPI: [name] is a 75 y.o. year old female with end stage OA of her left knee with progressively worsening pain and dysfunction. She has constant pain, with activity and at rest and significant functional deficits with difficulties even with ADLs. She has had extensive non-op management including analgesics, injections of cortisone and viscosupplements, and home exercise program, but remains in significant pain with significant dysfunction. Radiographs show bone on bone arthritis medial and patellofemoral. She presents now for left Total Knee Arthroplasty.  Laboratory Data: Admission on 11/08/2015  Component Date Value Ref Range Status  . WBC 11/09/2015 8.8  4.0 - 10.5 K/uL Final  . RBC 11/09/2015 3.46* 3.87 - 5.11 MIL/uL Final  . Hemoglobin 11/09/2015 11.5* 12.0 - 15.0 g/dL Final  . HCT 11/09/2015 33.0* 36.0 -  46.0 % Final  . MCV 11/09/2015 95.4  78.0 - 100.0 fL Final  . MCH 11/09/2015 33.2  26.0 - 34.0 pg Final  . MCHC 11/09/2015 34.8  30.0 - 36.0 g/dL Final  . RDW 11/09/2015 13.7  11.5 - 15.5 % Final  . Platelets 11/09/2015 191  150 - 400 K/uL Final  . Sodium 11/09/2015 130* 135 - 145 mmol/L Final  . Potassium 11/09/2015 3.6  3.5 - 5.1 mmol/L Final  . Chloride 11/09/2015 99* 101 - 111 mmol/L Final  . CO2 11/09/2015 24  22 - 32 mmol/L Final  . Glucose, Bld 11/09/2015 246* 65 - 99 mg/dL Final  . BUN 11/09/2015 7  6 - 20 mg/dL Final  . Creatinine, Ser 11/09/2015 0.81  0.44 - 1.00 mg/dL Final  . Calcium 11/09/2015 8.6* 8.9 - 10.3 mg/dL Final  . GFR calc non Af Amer 11/09/2015 >60  >60 mL/min Final  . GFR calc Af Amer 11/09/2015 >60  >60 mL/min Final   Comment: (NOTE) The eGFR has been calculated using the CKD EPI equation. This calculation has not been validated in all clinical situations. eGFR's persistently <60 mL/min signify possible Chronic Kidney Disease.   Georgiann Hahn gap 11/09/2015 7  5 - 15 Final  Hospital Outpatient Visit on 10/29/2015  Component Date Value Ref Range Status  . MRSA, PCR 10/29/2015 NEGATIVE  NEGATIVE Final  . Staphylococcus aureus 10/29/2015 NEGATIVE  NEGATIVE Final   Comment:  The Xpert SA Assay (FDA approved for NASAL specimens in patients over 39 years of age), is one component of a comprehensive surveillance program.  Test performance has been validated by [facility] for patients greater than or equal to 56 year old. It is not intended to diagnose infection nor to guide or monitor treatment.   Marland Kitchen aPTT 10/29/2015 26  24 - 37 seconds Final  . WBC 10/29/2015 6.6  4.0 - 10.5 K/uL Final  . RBC 10/29/2015 4.21  3.87 - 5.11 MIL/uL Final  . Hemoglobin 10/29/2015 13.9  12.0 - 15.0 g/dL Final  . HCT 10/29/2015 40.9  36.0 - 46.0 % Final  . MCV 10/29/2015 97.1  78.0 - 100.0 fL Final  . MCH 10/29/2015 33.0  26.0 - 34.0 pg Final  . MCHC 10/29/2015 34.0  30.0 -  36.0 g/dL Final  . RDW 10/29/2015 13.7  11.5 - 15.5 % Final  . Platelets 10/29/2015 262  150 - 400 K/uL Final  . Neutrophils Relative % 10/29/2015 55   Final  . Neutro Abs 10/29/2015 3.6  1.7 - 7.7 K/uL Final  . Lymphocytes Relative 10/29/2015 33   Final  . Lymphs Abs 10/29/2015 2.2  0.7 - 4.0 K/uL Final  . Monocytes Relative 10/29/2015 11   Final  . Monocytes Absolute 10/29/2015 0.7  0.1 - 1.0 K/uL Final  . Eosinophils Relative 10/29/2015 1   Final  . Eosinophils Absolute 10/29/2015 0.1  0.0 - 0.7 K/uL Final  . Basophils Relative 10/29/2015 0   Final  . Basophils Absolute 10/29/2015 0.0  0.0 - 0.1 K/uL Final  . Sodium 10/29/2015 136  135 - 145 mmol/L Final  . Potassium 10/29/2015 5.0  3.5 - 5.1 mmol/L Final  . Chloride 10/29/2015 100* 101 - 111 mmol/L Final  . CO2 10/29/2015 27  22 - 32 mmol/L Final  . Glucose, Bld 10/29/2015 102* 65 - 99 mg/dL Final  . BUN 10/29/2015 12  6 - 20 mg/dL Final  . Creatinine, Ser 10/29/2015 0.89  0.44 - 1.00 mg/dL Final  . Calcium 10/29/2015 9.4  8.9 - 10.3 mg/dL Final  . Total Protein 10/29/2015 7.3  6.5 - 8.1 g/dL Final  . Albumin 10/29/2015 4.3  3.5 - 5.0 g/dL Final  . AST 10/29/2015 39  15 - 41 U/L Final  . ALT 10/29/2015 32  14 - 54 U/L Final  . Alkaline Phosphatase 10/29/2015 92  38 - 126 U/L Final  . Total Bilirubin 10/29/2015 0.6  0.3 - 1.2 mg/dL Final  . GFR calc non Af Amer 10/29/2015 >60  >60 mL/min Final  . GFR calc Af Amer 10/29/2015 >60  >60 mL/min Final   Comment: (NOTE) The eGFR has been calculated using the CKD EPI equation. This calculation has not been validated in all clinical situations. eGFR's persistently <60 mL/min signify possible Chronic Kidney Disease.   . Anion gap 10/29/2015 9  5 - 15 Final  . Prothrombin Time 10/29/2015 13.1  11.6 - 15.2 seconds Final  . INR 10/29/2015 1.01  0.00 - 1.49 Final  . ABO/RH(D) 10/29/2015 AB POS   Final  . Antibody Screen 10/29/2015 NEG   Final  . Sample Expiration 10/29/2015 11/11/2015    Final  . Extend sample reason 10/29/2015 NO TRANSFUSIONS OR PREGNANCY IN THE PAST 3 MONTHS   Final  . Color, Urine 10/29/2015 YELLOW  YELLOW Final  . APPearance 10/29/2015 CLOUDY* CLEAR Final  . Specific Gravity, Urine 10/29/2015 1.020  1.005 - 1.030 Final  . pH  10/29/2015 6.0  5.0 - 8.0 Final  . Glucose, UA 10/29/2015 NEGATIVE  NEGATIVE mg/dL Final  . Hgb urine dipstick 10/29/2015 NEGATIVE  NEGATIVE Final  . Bilirubin Urine 10/29/2015 NEGATIVE  NEGATIVE Final  . Ketones, ur 10/29/2015 NEGATIVE  NEGATIVE mg/dL Final  . Protein, ur 10/29/2015 NEGATIVE  NEGATIVE mg/dL Final  . Nitrite 10/29/2015 NEGATIVE  NEGATIVE Final  . Leukocytes, UA 10/29/2015 LARGE* NEGATIVE Final  . Squamous Epithelial / LPF 10/29/2015 0-5* NONE SEEN Final  . WBC, UA 10/29/2015 6-30  0 - 5 WBC/hpf Final  . RBC / HPF 10/29/2015 0-5  0 - 5 RBC/hpf Final  . Bacteria, UA 10/29/2015 FEW* NONE SEEN Final  . Casts 10/29/2015 HYALINE CASTS* NEGATIVE Final  . Urine-Other 10/29/2015 MUCOUS PRESENT   Final  . ABO/RH(D) 10/29/2015 AB POS   Final     X-Rays:Dg Chest 2 View  10/29/2015  CLINICAL DATA:  Preop. Shortness of breath with exertion, chronic for several years. EXAM: CHEST  2 VIEW COMPARISON:  06/09/2013 FINDINGS: Scarring in the apices, left greater than right, stable. Lungs otherwise clear. No effusions. Heart is normal size. No acute bony abnormality. IMPRESSION: No active cardiopulmonary disease. Electronically Signed   By: Rolm Baptise M.D.   On: 10/29/2015 13:16    EKG: Orders placed or performed in visit on 03/09/15  . EKG 12-Lead     Hospital Course: [name] is a 75 y.o. who was admitted to [facility]. They were brought to the operating room on 11/08/2015 and underwent Procedure(s): LEFT TOTAL KNEE ARTHROPLASTY.  Patient tolerated the procedure well and was later transferred to the recovery room and then to the orthopaedic floor for postoperative care.  They were given PO and IV analgesics for  pain control following their surgery.  They were given 24 hours of postoperative antibiotics of  Anti-infectives    Start     Dose/Rate Route Frequency Ordered Stop   11/09/15 0600  ceFAZolin (ANCEF) IVPB 2g/100 mL premix     2 g 200 mL/hr over 30 Minutes Intravenous On call to O.R. 11/08/15 1202 11/08/15 1340   11/08/15 2000  ceFAZolin (ANCEF) IVPB 2g/100 mL premix     2 g 200 mL/hr over 30 Minutes Intravenous Every 6 hours 11/08/15 1708 11/09/15 0154     and started on DVT prophylaxis in the form of Xarelto.   PT and OT were ordered for total joint protocol.  Discharge planning consulted to help with postop disposition and equipment needs.  Patient had a good night on the evening of surgery.  They started to get up OOB with therapy on day one. Hemovac drain was pulled without difficulty.  Continued to work with therapy into day two.  Dressing was changed on day two and the incision was healing well.  Patient was seen in rounds and was ready to go home on day two  Discharge home with home health Diet - Cardiac diet Follow up - in 2 weeks Activity - WBAT Disposition - Home Condition Upon Discharge - Good D/C Meds - See DC Summary DVT Prophylaxis - Xarelto  Discharge Instructions    Call MD / Call 911    Complete by:  As directed   If you experience chest pain or shortness of breath, CALL 911 and be transported to the hospital emergency room.  If you develope a fever above 101 F, pus (white drainage) or increased drainage or redness at the wound, or calf pain, call your surgeon's office.  Change dressing    Complete by:  As directed   Change dressing daily with sterile 4 x 4 inch gauze dressing and apply TED hose. Do not submerge the incision under water.     Constipation Prevention    Complete by:  As directed   Drink plenty of fluids.  Prune juice may be helpful.  You may use a stool softener, such as Colace (over the counter) 100 mg twice a day.  Use MiraLax (over the counter) for  constipation as needed.     Diet - low sodium heart healthy    Complete by:  As directed      Discharge instructions    Complete by:  As directed   Pick up stool softner and laxative for home use following surgery while on pain medications. Do not submerge incision under water. Please use good hand washing techniques while changing dressing each day. May shower starting three days after surgery. Please use a clean towel to pat the incision dry following showers. Continue to use ice for pain and swelling after surgery. Do not use any lotions or creams on the incision until instructed by your surgeon.  Take Xarelto for two and a half more weeks, then discontinue Xarelto. Once the patient has completed the blood thinner regimen, then take a Baby 81 mg Aspirin daily for three more weeks.  Postoperative Constipation Protocol  Constipation - defined medically as fewer than three stools per week and severe constipation as less than one stool per week.  One of the most common issues patients have following surgery is constipation.  Even if you have a regular bowel pattern at home, your normal regimen is likely to be disrupted due to multiple reasons following surgery.  Combination of anesthesia, postoperative narcotics, change in appetite and fluid intake all can affect your bowels.  In order to avoid complications following surgery, here are some recommendations in order to help you during your recovery period.  Colace (docusate) - Pick up an over-the-counter form of Colace or another stool softener and take twice a day as long as you are requiring postoperative pain medications.  Take with a full glass of water daily.  If you experience loose stools or diarrhea, hold the colace until you stool forms back up.  If your symptoms do not get better within 1 week or if they get worse, check with your doctor.  Dulcolax (bisacodyl) - Pick up over-the-counter and take as directed by the product packaging as  needed to assist with the movement of your bowels.  Take with a full glass of water.  Use this product as needed if not relieved by Colace only.   MiraLax (polyethylene glycol) - Pick up over-the-counter to have on hand.  MiraLax is a solution that will increase the amount of water in your bowels to assist with bowel movements.  Take as directed and can mix with a glass of water, juice, soda, coffee, or tea.  Take if you go more than two days without a movement. Do not use MiraLax more than once per day. Call your doctor if you are still constipated or irregular after using this medication for 7 days in a row.  If you continue to have problems with postoperative constipation, please contact the office for further assistance and recommendations.  If you experience "the worst abdominal pain ever" or develop nausea or vomiting, please contact the office immediatly for further recommendations for treatment.     Do not put a pillow  under the knee. Place it under the heel.    Complete by:  As directed      Do not sit on low chairs, stoools or toilet seats, as it may be difficult to get up from low surfaces    Complete by:  As directed      Driving restrictions    Complete by:  As directed   No driving until released by the physician.     Increase activity slowly as tolerated    Complete by:  As directed      Lifting restrictions    Complete by:  As directed   No lifting until released by the physician.     Patient may shower    Complete by:  As directed   You may shower without a dressing once there is no drainage.  Do not wash over the wound.  If drainage remains, do not shower until drainage stops.     TED hose    Complete by:  As directed   Use stockings (TED hose) for 3 weeks on both leg(s).  You may remove them at night for sleeping.     Weight bearing as tolerated    Complete by:  As directed   Laterality:  left  Extremity:  Lower            Medication List    STOP taking these  medications        lidocaine 5 %  Commonly known as:  LIDODERM      TAKE these medications        amLODipine 5 MG tablet  Commonly known as:  NORVASC  TAKE ONE TABLET BY MOUTH DAILY     atenolol 25 MG tablet  Commonly known as:  TENORMIN  TAKE ONE TABLET BY MOUTH DAILY     diazepam 10 MG tablet  Commonly known as:  VALIUM  Take 5 mg by mouth every 8 (eight) hours as needed for anxiety.     gabapentin 100 MG capsule  Commonly known as:  NEURONTIN  Take 100 mg by mouth at bedtime.     methocarbamol 500 MG tablet  Commonly known as:  ROBAXIN  Take 1 tablet (500 mg total) by mouth every 6 (six) hours as needed for muscle spasms.     oxyCODONE 5 MG immediate release tablet  Commonly known as:  Oxy IR/ROXICODONE  Take 1-2 tablets (5-10 mg total) by mouth every 3 (three) hours as needed for moderate pain or severe pain.     polyvinyl alcohol 1.4 % ophthalmic solution  Commonly known as:  LIQUIFILM TEARS  Place 1 drop into both eyes every 2 (two) hours as needed for dry eyes.     pregabalin 75 MG capsule  Commonly known as:  LYRICA  Take 1 capsule (75 mg total) by mouth 2 (two) times daily.     rivaroxaban 10 MG Tabs tablet  Commonly known as:  XARELTO  Take 1 tablet (10 mg total) by mouth daily with breakfast. Take Xarelto for two and a half more weeks, then discontinue Xarelto. Once the patient has completed the blood thinner regimen, then take a Baby 81 mg Aspirin daily for three more weeks.     SALINE NA  Place 1 spray into both nostrils every 2 (two) hours as needed (dryness).     simvastatin 40 MG tablet  Commonly known as:  ZOCOR  Take 1 tablet (40 mg total) by mouth at bedtime.     traMADol  50 MG tablet  Commonly known as:  ULTRAM  Take 1-2 tablets (50-100 mg total) by mouth every 6 (six) hours as needed (mild pain).     zolpidem 10 MG tablet  Commonly known as:  AMBIEN  Take 10 mg by mouth at bedtime.           Follow-up Information    Follow up with  [facility].   Why:  home health physical therapy   Contact information:   [facility] [place] [address] [phone]       Follow up with Gearlean Alf, MD. Schedule an appointment as soon as possible for a visit on 11/23/2015.   Specialty:  Orthopedic Surgery   Why:  Call office at [phone] to setup appointment on Tuesday 11/23/15 with Dr. Wynelle Link.   Contact information:   [address] [place] [address] [phone]       Signed: Arlee Muslim, PA-C Orthopaedic Surgery 11/09/2015, 10:10 PM

## 2015-11-09 NOTE — Progress Notes (Signed)
Utilization review completed.  

## 2015-11-09 NOTE — Care Management Note (Signed)
Case Management Note  Patient Details  Name: STELLA BORTLE MRN: [id number] Date of Birth: September 24, 1940  Subjective/Objective:                  LEFT TOTAL KNEE ARTHROPLASTY (Left) Action/Plan: Discharge planning Expected Discharge Date:  11/10/15              Expected Discharge Plan:  [place]  In-House Referral:     Discharge planning Services  CM Consult  Post Acute Care Choice:    Choice offered to:     DME Arranged:  3-N-1 DME Agency:  [place]:  PT [facility] Agency:  [place]  Status of Service:  Completed, signed off  Medicare Important Message Given:    Date Medicare IM Given:    Medicare IM give by:    Date Additional Medicare IM Given:    Additional Medicare Important Message give by:     If discussed at [place] of Stay Meetings, dates discussed:    Additional Comments: CM met with pt and pt's daughter in room to offer choice of home health agency.  Pt chooses [facility] to render HHPT.  Referral given to [employer], Tim.  CM called [facility] DME rep, Lecretia to please deliver the 3n1 to room prior to discharge.  No other CM needs were communicated. Dellie Catholic, RN 11/09/2015, 9:10 AM

## 2015-11-10 LAB — BASIC METABOLIC PANEL
Anion gap: 9 (ref 5–15)
BUN: 7 mg/dL (ref 6–20)
CHLORIDE: 101 mmol/L (ref 101–111)
CO2: 26 mmol/L (ref 22–32)
CREATININE: 0.7 mg/dL (ref 0.44–1.00)
Calcium: 8.7 mg/dL — ABNORMAL LOW (ref 8.9–10.3)
GFR calc non Af Amer: >60 mL/min (ref >60)
GLUCOSE: 107 mg/dL — AB (ref 65–99)
Potassium: 4 mmol/L (ref 3.5–5.1)
Sodium: 136 mmol/L (ref 135–145)

## 2015-11-10 LAB — CBC
HEMATOCRIT: 32.6 % — AB (ref 36.0–46.0)
HEMOGLOBIN: 11.4 g/dL — AB (ref 12.0–15.0)
MCH: 33.5 pg (ref 26.0–34.0)
MCHC: 35 g/dL (ref 30.0–36.0)
MCV: 95.9 fL (ref 78.0–100.0)
Platelets: 212 10*3/uL (ref 150–400)
RBC: 3.4 MIL/uL — ABNORMAL LOW (ref 3.87–5.11)
RDW: 14.3 % (ref 11.5–15.5)
WBC: 10.7 10*3/uL — ABNORMAL HIGH (ref 4.0–10.5)

## 2015-11-10 NOTE — Progress Notes (Signed)
[facility]    [facility] is providing the following services: [employer]  If patient discharges after hours, please call [phone].   Linward Headland 11/10/2015, 10:15 AM

## 2015-11-10 NOTE — Progress Notes (Signed)
   Subjective: 2 Days Post-Op Procedure(s) (LRB): LEFT TOTAL KNEE ARTHROPLASTY (Left) Patient reports pain as mild.   Patient seen in rounds with Dr. Wynelle Link. Patient is well, and has had no acute complaints or problems Patient is ready to go home  Objective: Vital signs in last 24 hours: Temp:  [97.4 F (36.3 C)-99.4 F (37.4 C)] 99.4 F (37.4 C) (05/03 0600) Pulse Rate:  [60-74] 74 (05/03 0600) Resp:  [16-18] 16 (05/03 0600) BP: (117-142)/(48-68) 142/68 mmHg (05/03 0600) SpO2:  [96 %-99 %] 96 % (05/03 0600)  Intake/Output from previous day:  Intake/Output Summary (Last 24 hours) at 11/10/15 0734 Last data filed at 11/09/15 1415  Gross per 24 hour  Intake    240 ml  Output    400 ml  Net   -160 ml    Labs:  Recent Labs  11/09/15 0416 11/10/15 0419  HGB 11.5* 11.4*    Recent Labs  11/09/15 0416 11/10/15 0419  WBC 8.8 10.7*  RBC 3.46* 3.40*  HCT 33.0* 32.6*  PLT 191 212    Recent Labs  11/09/15 0416 11/10/15 0419  NA 130* 136  K 3.6 4.0  CL 99* 101  CO2 24 26  BUN 7 7  CREATININE 0.81 0.70  GLUCOSE 246* 107*  CALCIUM 8.6* 8.7*   No results for input(s): LABPT, INR in the last 72 hours.  EXAM: General - Patient is Alert and Oriented Extremity - Neurovascular intact Sensation intact distally Incision - clean, no drainage Motor Function - intact, moving foot and toes well on exam.   Assessment/Plan: 2 Days Post-Op Procedure(s) (LRB): LEFT TOTAL KNEE ARTHROPLASTY (Left) Procedure(s) (LRB): LEFT TOTAL KNEE ARTHROPLASTY (Left) Past Medical History  Diagnosis Date  . Essential hypertension, benign   . Fibromyalgia   . Pure hypercholesterolemia 12/16/2013  . Osteoarthritis   . Low back pain     facet joint pain  . Benign essential tremor   . Hyponatremia     secondary to hctz-recurrent March 2010  . Hemorrhoids   . Osteopenia   . GERD (gastroesophageal reflux disease)   . Occipital neuralgia   . Chronic kidney disease   . Heart murmur     . Shortness of breath dyspnea   . Tuberculosis     as a child  . Cancer [facility])     h/o skin cancer   Principal Problem:   OA (osteoarthritis) of knee  Estimated body mass index is 26.13 kg/(m^2) as calculated from the following:   Height as of this encounter: 5\' 5"  (1.651 m).   Weight as of this encounter: 71.215 kg (157 lb). Up with therapy Discharge home with home health Diet - Cardiac diet Follow up - in 2 weeks Activity - WBAT Disposition - Home Condition Upon Discharge - Good D/C Meds - See DC Summary DVT Prophylaxis - [place], PA-C Orthopaedic Surgery 11/10/2015, 7:34 AM

## 2015-11-10 NOTE — Progress Notes (Signed)
Physical Therapy Treatment Patient Details Name: [name] MRN: [id number] DOB: 1941/03/30 Today's Date: 11/10/2015    History of Present Illness Pt is a 75 year old female s/p L TKA    PT Comments    Pt ambulated short distance in hallway and practiced one step.  Daughter present for session.  Pt reports more pain today and declined exercises at this time however agreeable to perform once home with daughter so provided HEP handout.  Answered pt and daughter questions and pt to d/c home today.   Follow Up Recommendations  Home health PT;Supervision for mobility/OOB     Equipment Recommendations  None recommended by PT    Recommendations for Other Services       Precautions / Restrictions Precautions Precautions: Fall;Knee Required Braces or Orthoses: Knee Immobilizer - Left Restrictions Weight Bearing Restrictions: No Other Position/Activity Restrictions: WBAT    Mobility  Bed Mobility Overal bed mobility: Needs Assistance Bed Mobility: Supine to Sit;Sit to Supine     Supine to sit: Min assist Sit to supine: Min assist   General bed mobility comments: assist for L LE, attempted self assist however pt unable and requesting assist, daughter present and can assist at home  Transfers Overall transfer level: Needs assistance Equipment used: Rolling walker (2 wheeled) Transfers: Sit to/from Stand Sit to Stand: Min guard         General transfer comment: verbal cue for LE positioning  Ambulation/Gait Ambulation/Gait assistance: Min guard Ambulation Distance (Feet): 60 Feet Assistive device: Standard walker Gait Pattern/deviations: Step-to pattern;Antalgic     General Gait Details: verbal cues for sequence, RW positioning, step length, posture, pt reports increased pain today   Stairs Stairs: Yes Stairs assistance: Min guard Stair Management: Step to pattern;Forwards;With walker Number of Stairs: 1 General stair comments: verbal cues for safe technique,  sequence, daughter observed  Wheelchair Mobility    Modified Rankin (Stroke Patients Only)       Balance                                    Cognition Arousal/Alertness: Awake/alert Behavior During Therapy: WFL for tasks assessed/performed Overall Cognitive Status: Within Functional Limits for tasks assessed                      Exercises      General Comments        Pertinent Vitals/Pain Pain Assessment: 0-10 Pain Score: 5  Pain Location: L knee Pain Descriptors / Indicators: Aching;Sore Pain Intervention(s): Limited activity within patient's tolerance;Monitored during session;Repositioned;Premedicated before session    Home Living                      Prior Function            PT Goals (current goals can now be found in the care plan section) Progress towards PT goals: Progressing toward goals    Frequency  7X/week    PT Plan Current plan remains appropriate    Co-evaluation             End of Session Equipment Utilized During Treatment: Left knee immobilizer Activity Tolerance: Patient limited by pain Patient left: in bed;with call bell/phone within reach;with family/visitor present     Time: [id number] PT Time Calculation (min) (ACUTE ONLY): 15 min  Charges:  $Gait Training: 8-22 mins  G Codes:      Rocky Rishel,KATHrine E Nov 21, 2015, 12:19 PM Carmelia Bake, PT, DPT 11-21-2015 Pager: [phone]

## 2015-11-11 DIAGNOSIS — M1711 Unilateral primary osteoarthritis, right knee: Secondary | ICD-10-CM | POA: Diagnosis not present

## 2015-11-11 DIAGNOSIS — Z87440 Personal history of urinary (tract) infections: Secondary | ICD-10-CM | POA: Diagnosis not present

## 2015-11-11 DIAGNOSIS — Z9181 History of falling: Secondary | ICD-10-CM | POA: Diagnosis not present

## 2015-11-11 DIAGNOSIS — I10 Essential (primary) hypertension: Secondary | ICD-10-CM | POA: Diagnosis not present

## 2015-11-11 DIAGNOSIS — Z7901 Long term (current) use of anticoagulants: Secondary | ICD-10-CM | POA: Diagnosis not present

## 2015-11-11 DIAGNOSIS — Z96652 Presence of left artificial knee joint: Secondary | ICD-10-CM | POA: Diagnosis not present

## 2015-11-11 DIAGNOSIS — Z471 Aftercare following joint replacement surgery: Secondary | ICD-10-CM | POA: Diagnosis not present

## 2015-11-11 DIAGNOSIS — M797 Fibromyalgia: Secondary | ICD-10-CM | POA: Diagnosis not present

## 2015-11-11 DIAGNOSIS — Z79891 Long term (current) use of opiate analgesic: Secondary | ICD-10-CM | POA: Diagnosis not present

## 2015-11-11 DIAGNOSIS — Z85828 Personal history of other malignant neoplasm of skin: Secondary | ICD-10-CM | POA: Diagnosis not present

## 2015-11-15 DIAGNOSIS — M797 Fibromyalgia: Secondary | ICD-10-CM | POA: Diagnosis not present

## 2015-11-15 DIAGNOSIS — Z9181 History of falling: Secondary | ICD-10-CM | POA: Diagnosis not present

## 2015-11-15 DIAGNOSIS — Z79891 Long term (current) use of opiate analgesic: Secondary | ICD-10-CM | POA: Diagnosis not present

## 2015-11-15 DIAGNOSIS — Z85828 Personal history of other malignant neoplasm of skin: Secondary | ICD-10-CM | POA: Diagnosis not present

## 2015-11-15 DIAGNOSIS — Z96652 Presence of left artificial knee joint: Secondary | ICD-10-CM | POA: Diagnosis not present

## 2015-11-15 DIAGNOSIS — Z87440 Personal history of urinary (tract) infections: Secondary | ICD-10-CM | POA: Diagnosis not present

## 2015-11-15 DIAGNOSIS — M1711 Unilateral primary osteoarthritis, right knee: Secondary | ICD-10-CM | POA: Diagnosis not present

## 2015-11-15 DIAGNOSIS — Z7901 Long term (current) use of anticoagulants: Secondary | ICD-10-CM | POA: Diagnosis not present

## 2015-11-15 DIAGNOSIS — Z471 Aftercare following joint replacement surgery: Secondary | ICD-10-CM | POA: Diagnosis not present

## 2015-11-15 DIAGNOSIS — I10 Essential (primary) hypertension: Secondary | ICD-10-CM | POA: Diagnosis not present

## 2015-11-22 DIAGNOSIS — Z471 Aftercare following joint replacement surgery: Secondary | ICD-10-CM | POA: Diagnosis not present

## 2015-11-22 DIAGNOSIS — Z9181 History of falling: Secondary | ICD-10-CM | POA: Diagnosis not present

## 2015-11-22 DIAGNOSIS — M1711 Unilateral primary osteoarthritis, right knee: Secondary | ICD-10-CM | POA: Diagnosis not present

## 2015-11-22 DIAGNOSIS — Z87440 Personal history of urinary (tract) infections: Secondary | ICD-10-CM | POA: Diagnosis not present

## 2015-11-22 DIAGNOSIS — Z85828 Personal history of other malignant neoplasm of skin: Secondary | ICD-10-CM | POA: Diagnosis not present

## 2015-11-22 DIAGNOSIS — Z96652 Presence of left artificial knee joint: Secondary | ICD-10-CM | POA: Diagnosis not present

## 2015-11-22 DIAGNOSIS — M797 Fibromyalgia: Secondary | ICD-10-CM | POA: Diagnosis not present

## 2015-11-22 DIAGNOSIS — Z79891 Long term (current) use of opiate analgesic: Secondary | ICD-10-CM | POA: Diagnosis not present

## 2015-11-22 DIAGNOSIS — Z7901 Long term (current) use of anticoagulants: Secondary | ICD-10-CM | POA: Diagnosis not present

## 2015-11-22 DIAGNOSIS — I10 Essential (primary) hypertension: Secondary | ICD-10-CM | POA: Diagnosis not present

## 2015-11-26 DIAGNOSIS — M1712 Unilateral primary osteoarthritis, left knee: Secondary | ICD-10-CM | POA: Diagnosis not present

## 2015-11-30 DIAGNOSIS — M1712 Unilateral primary osteoarthritis, left knee: Secondary | ICD-10-CM | POA: Diagnosis not present

## 2015-12-02 DIAGNOSIS — M1712 Unilateral primary osteoarthritis, left knee: Secondary | ICD-10-CM | POA: Diagnosis not present

## 2015-12-09 DIAGNOSIS — M1712 Unilateral primary osteoarthritis, left knee: Secondary | ICD-10-CM | POA: Diagnosis not present

## 2015-12-14 DIAGNOSIS — Z96652 Presence of left artificial knee joint: Secondary | ICD-10-CM | POA: Diagnosis not present

## 2015-12-14 DIAGNOSIS — Z471 Aftercare following joint replacement surgery: Secondary | ICD-10-CM | POA: Diagnosis not present

## 2015-12-14 DIAGNOSIS — M1712 Unilateral primary osteoarthritis, left knee: Secondary | ICD-10-CM | POA: Diagnosis not present

## 2016-01-05 DIAGNOSIS — M1712 Unilateral primary osteoarthritis, left knee: Secondary | ICD-10-CM | POA: Diagnosis not present

## 2016-01-07 DIAGNOSIS — M1712 Unilateral primary osteoarthritis, left knee: Secondary | ICD-10-CM | POA: Diagnosis not present

## 2016-01-12 DIAGNOSIS — M1712 Unilateral primary osteoarthritis, left knee: Secondary | ICD-10-CM | POA: Diagnosis not present

## 2016-01-18 DIAGNOSIS — M1712 Unilateral primary osteoarthritis, left knee: Secondary | ICD-10-CM | POA: Diagnosis not present

## 2016-01-20 DIAGNOSIS — M1712 Unilateral primary osteoarthritis, left knee: Secondary | ICD-10-CM | POA: Diagnosis not present

## 2016-02-21 ENCOUNTER — Ambulatory Visit: Payer: PPO | Admitting: Physical Medicine & Rehabilitation

## 2016-02-29 DIAGNOSIS — M810 Age-related osteoporosis without current pathological fracture: Secondary | ICD-10-CM | POA: Diagnosis not present

## 2016-02-29 DIAGNOSIS — R5381 Other malaise: Secondary | ICD-10-CM | POA: Diagnosis not present

## 2016-02-29 DIAGNOSIS — G4701 Insomnia due to medical condition: Secondary | ICD-10-CM | POA: Diagnosis not present

## 2016-02-29 DIAGNOSIS — M797 Fibromyalgia: Secondary | ICD-10-CM | POA: Diagnosis not present

## 2016-03-08 DIAGNOSIS — I10 Essential (primary) hypertension: Secondary | ICD-10-CM | POA: Diagnosis not present

## 2016-03-08 DIAGNOSIS — N182 Chronic kidney disease, stage 2 (mild): Secondary | ICD-10-CM | POA: Diagnosis not present

## 2016-03-08 DIAGNOSIS — M797 Fibromyalgia: Secondary | ICD-10-CM | POA: Diagnosis not present

## 2016-03-08 DIAGNOSIS — R339 Retention of urine, unspecified: Secondary | ICD-10-CM | POA: Diagnosis not present

## 2016-03-08 DIAGNOSIS — E871 Hypo-osmolality and hyponatremia: Secondary | ICD-10-CM | POA: Diagnosis not present

## 2016-04-15 ENCOUNTER — Other Ambulatory Visit: Payer: Self-pay | Admitting: Radiology

## 2016-04-15 DIAGNOSIS — Z79899 Other long term (current) drug therapy: Secondary | ICD-10-CM

## 2016-05-01 ENCOUNTER — Other Ambulatory Visit: Payer: Self-pay | Admitting: Radiology

## 2016-05-01 ENCOUNTER — Telehealth: Payer: Self-pay | Admitting: Radiology

## 2016-05-01 ENCOUNTER — Other Ambulatory Visit: Payer: Self-pay | Admitting: Rheumatology

## 2016-05-01 ENCOUNTER — Ambulatory Visit: Payer: PPO | Admitting: Physical Medicine & Rehabilitation

## 2016-05-01 NOTE — Telephone Encounter (Signed)
Ok to refill Lyrica 75mg  bid, and Ambien CR at hs   I agree w/ pt that maybe pcp can Rx tramadol

## 2016-05-01 NOTE — Telephone Encounter (Signed)
Patient needs an appt in Feb with Dr Estanislado Pandy or Mr Carlyon Shadow please call her to make appt

## 2016-05-01 NOTE — Telephone Encounter (Signed)
Last visit 02/29/16 Next visit due in Feb, sent note to [employer] for scheduling Ok to refill Ambien CR ?

## 2016-05-01 NOTE — Telephone Encounter (Signed)
I spoke to patient/ I have request for Lyrica 75mg , Ambien CR, and Tramadol . Advised patient Tramadol can not be refilled since she has declined UDS, she states insurance will not cover, she will ask her PCP to refill Tramadol  Ok to refill Lyrica 75mg  bid, and Ambien CR at hs? Last visit 02/29/16, message sent to make appt for her in Feb.

## 2016-05-01 NOTE — Telephone Encounter (Signed)
Pt called about refill for Ambien, pharm does not have it yet.  Pt states she uses [facility] on [address] for this rx only.

## 2016-05-02 ENCOUNTER — Telehealth: Payer: Self-pay | Admitting: Rheumatology

## 2016-05-02 NOTE — Telephone Encounter (Signed)
I have put in the Rx's in the module, however I get a warning regarding her meds, she is using Lyrica and Gabapentin, are you aware, and do you want her to use both?

## 2016-05-02 NOTE — Telephone Encounter (Signed)
Called patient & scheduled Feb appt and patient is requesting lab orders be faxed to Dr. Kelton Pillar because she has an appt there next week. Pt states she has to have the labs done in order to get tramadol refilled?

## 2016-05-02 NOTE — Telephone Encounter (Signed)
Faxed order for UDS to Dr Laurann Montana

## 2016-05-03 ENCOUNTER — Other Ambulatory Visit: Payer: Self-pay | Admitting: Radiology

## 2016-05-03 DIAGNOSIS — Z79899 Other long term (current) drug therapy: Secondary | ICD-10-CM | POA: Diagnosis not present

## 2016-05-03 DIAGNOSIS — Z5181 Encounter for therapeutic drug level monitoring: Secondary | ICD-10-CM | POA: Diagnosis not present

## 2016-05-03 LAB — CBC WITH DIFFERENTIAL/PLATELET
BASOS ABS: 0 {cells}/uL (ref 0–200)
Basophils Relative: 0 %
Eosinophils Absolute: 51 cells/uL (ref 15–500)
Eosinophils Relative: 1 %
HEMATOCRIT: 43.7 % (ref 35.0–45.0)
HEMOGLOBIN: 14.8 g/dL (ref 11.7–15.5)
LYMPHS ABS: 2142 {cells}/uL (ref 850–3900)
Lymphocytes Relative: 42 %
MCH: 33.3 pg — ABNORMAL HIGH (ref 27.0–33.0)
MCHC: 33.9 g/dL (ref 32.0–36.0)
MCV: 98.4 fL (ref 80.0–100.0)
MONO ABS: 510 {cells}/uL (ref 200–950)
MPV: 11.2 fL (ref 7.5–12.5)
Monocytes Relative: 10 %
NEUTROS PCT: 47 %
Neutro Abs: 2397 cells/uL (ref 1500–7800)
Platelets: 242 10*3/uL (ref 140–400)
RBC: 4.44 MIL/uL (ref 3.80–5.10)
RDW: 14.4 % (ref 11.0–15.0)
WBC: 5.1 10*3/uL (ref 3.8–10.8)

## 2016-05-03 LAB — COMPLETE METABOLIC PANEL WITH GFR
ALBUMIN: 4 g/dL (ref 3.6–5.1)
ALK PHOS: 95 U/L (ref 33–130)
ALT: 16 U/L (ref 6–29)
AST: 26 U/L (ref 10–35)
BUN: 11 mg/dL (ref 7–25)
CHLORIDE: 98 mmol/L (ref 98–110)
CO2: 25 mmol/L (ref 20–31)
CREATININE: 1.1 mg/dL — AB (ref 0.60–0.93)
Calcium: 9.4 mg/dL (ref 8.6–10.4)
GFR, Est African American: 56 mL/min — ABNORMAL LOW (ref >=60)
GFR, Est Non African American: 49 mL/min — ABNORMAL LOW (ref >=60)
GLUCOSE: 101 mg/dL — AB (ref 65–99)
POTASSIUM: 5.1 mmol/L (ref 3.5–5.3)
SODIUM: 133 mmol/L — AB (ref 135–146)
Total Bilirubin: 0.5 mg/dL (ref 0.2–1.2)
Total Protein: 6.5 g/dL (ref 6.1–8.1)

## 2016-05-03 MED ORDER — ZOLPIDEM TARTRATE ER 12.5 MG PO TBCR
12.5000 mg | EXTENDED_RELEASE_TABLET | Freq: Every evening | ORAL | 0 refills | Status: DC | PRN
Start: 2016-05-03 — End: 2016-08-22

## 2016-05-03 MED ORDER — PREGABALIN 75 MG PO CAPS: 75.0000 mg | ORAL_CAPSULE | Freq: Two times a day (BID) | ORAL | 0 refills | Status: DC

## 2016-05-03 NOTE — Telephone Encounter (Signed)
I signed out to samples of Lyrica boxes 75 mg for the patient. Please give her the samples when she comes in today. Thank you

## 2016-05-03 NOTE — Telephone Encounter (Addendum)
Medication Samples have been provided to the patient.  Drug name: Lyrica   Strength: 75 mg        Qty: 28 capsules      LOT: [id number]  Exp.Date: 05/2018  Dosing instructions: Take 2 capsules by mouth twice daily.    The patient has been instructed regarding the correct time, dose, and frequency of taking this medication, including desired effects and most common side effects.  Patient currently takes Lyrica in the morning, at 2 PM, and then takes a gabapentin 100 mg in the evening.  She reports she does not take Lyrica TID due to cost of the medication.  Counseled patient on the risk of additive CNS depression with the use of Lyrica and gabapentin.  Patient voiced understanding.  Discussed patient assistance program for Lyrica.  Patient reports she has never applied, but would be interested.  Will provide patient with the application.  Advised her to call me when she submits the application.  If she qualifies, we will send over the provider portion of the application.  Patient voiced understanding.    Cyndia Skeeters 4:51 PM 05/03/2016

## 2016-05-03 NOTE — Telephone Encounter (Signed)
I called patient. Due to financial reasons, patient uses Lyrica in the daytime and noon time. Not afford evening dose. So she relies on gabapentin for the evening dose.She will be coming into the office today to give [place] a urine drug screen so we can start re-prescribing her tramadol.

## 2016-05-03 NOTE — Telephone Encounter (Signed)
Thank you  She did come in for the UDS and her CBC CMP  Apolonio Schneiders did give her the samples.

## 2016-05-05 ENCOUNTER — Other Ambulatory Visit: Payer: Self-pay | Admitting: Rheumatology

## 2016-05-05 ENCOUNTER — Telehealth: Payer: Self-pay | Admitting: Radiology

## 2016-05-05 NOTE — Telephone Encounter (Signed)
Patient called questioning the refill of Ambien. Advised patient that we received an auth for Ambien this afternoon and patient is upset because the pharmacy has not refilled it yet.

## 2016-05-05 NOTE — Telephone Encounter (Signed)
Have gotten a prior auth request for Ambien CR 12.5

## 2016-05-07 NOTE — Telephone Encounter (Signed)
Thank you for calling her the Rx has been sent. There is a PA requirement / she may purchase it if she would like  I will call her.

## 2016-05-08 ENCOUNTER — Telehealth: Payer: Self-pay | Admitting: Radiology

## 2016-05-08 LAB — PAIN MGMT, TRAMADOL W/MEDMATCH, U
Desmethyltramadol: 30730 ng/mL — ABNORMAL HIGH (ref <100)
Tramadol: >50000 ng/mL — ABNORMAL HIGH (ref <100)

## 2016-05-08 NOTE — Telephone Encounter (Signed)
[facility].

## 2016-05-08 NOTE — Telephone Encounter (Signed)
Have completed a PA request via cover my meds

## 2016-05-09 LAB — CP5000051 PDM PROFILE
AMPHETAMINES: NEGATIVE ng/mL
BARBITURATES: NEGATIVE ng/mL
Buprenorphine: NEGATIVE ng/mL
Buprenorphine: NEGATIVE ng/mL
Cocaine Metabolite: NEGATIVE ng/mL
Desmethyltramadol: 30730 ng/mL — ABNORMAL HIGH
FENTANYL: NEGATIVE ng/mL (ref <0.5)
MARIJUANA METABOLITE: NEGATIVE ng/mL
MDMA: NEGATIVE ng/mL
METHADONE METABOLITE: NEGATIVE ng/mL
Meperidine: NEGATIVE ng/mL (ref <100)
Meprobamate: NEGATIVE ng/mL (ref <1000)
NALOXONE: NEGATIVE ng/mL
NORFENTANYL: NEGATIVE ng/mL (ref <0.5)
NORTAPENTADOL: NEGATIVE ng/mL (ref <50)
Norbuprenorphine: NEGATIVE ng/mL
Normeperidine: NEGATIVE ng/mL (ref <100)
OXYCODONE: NEGATIVE ng/mL
PHENCYCLIDINE: NEGATIVE ng/mL
TAPENTADOL: NEGATIVE ng/mL (ref <50)
Tramadol: >50000 ng/mL — ABNORMAL HIGH
ZOLPIDEM METABOLITE: 1630 ng/mL — AB (ref <5)
ZOLPIDEM: 60 ng/mL — AB (ref <5)

## 2016-05-21 DIAGNOSIS — N390 Urinary tract infection, site not specified: Secondary | ICD-10-CM | POA: Diagnosis not present

## 2016-05-24 DIAGNOSIS — I129 Hypertensive chronic kidney disease with stage 1 through stage 4 chronic kidney disease, or unspecified chronic kidney disease: Secondary | ICD-10-CM | POA: Diagnosis not present

## 2016-05-24 DIAGNOSIS — M1990 Unspecified osteoarthritis, unspecified site: Secondary | ICD-10-CM | POA: Diagnosis not present

## 2016-05-24 DIAGNOSIS — Z0000 Encounter for general adult medical examination without abnormal findings: Secondary | ICD-10-CM | POA: Diagnosis not present

## 2016-05-24 DIAGNOSIS — N183 Chronic kidney disease, stage 3 (moderate): Secondary | ICD-10-CM | POA: Diagnosis not present

## 2016-05-24 DIAGNOSIS — J309 Allergic rhinitis, unspecified: Secondary | ICD-10-CM | POA: Diagnosis not present

## 2016-05-24 DIAGNOSIS — M797 Fibromyalgia: Secondary | ICD-10-CM | POA: Diagnosis not present

## 2016-05-24 DIAGNOSIS — G250 Essential tremor: Secondary | ICD-10-CM | POA: Diagnosis not present

## 2016-05-24 DIAGNOSIS — E7800 Pure hypercholesterolemia, unspecified: Secondary | ICD-10-CM | POA: Diagnosis not present

## 2016-05-24 DIAGNOSIS — E871 Hypo-osmolality and hyponatremia: Secondary | ICD-10-CM | POA: Diagnosis not present

## 2016-05-24 DIAGNOSIS — M859 Disorder of bone density and structure, unspecified: Secondary | ICD-10-CM | POA: Diagnosis not present

## 2016-06-11 ENCOUNTER — Other Ambulatory Visit: Payer: Self-pay | Admitting: Rheumatology

## 2016-06-12 NOTE — Telephone Encounter (Signed)
Last Visit: 02/29/16 Next Visit: 08/21/16  Okay to refill Gabapentin?

## 2016-07-11 ENCOUNTER — Other Ambulatory Visit: Payer: Self-pay | Admitting: Interventional Cardiology

## 2016-07-11 ENCOUNTER — Telehealth: Payer: Self-pay | Admitting: Radiology

## 2016-07-11 ENCOUNTER — Telehealth: Payer: Self-pay | Admitting: Rheumatology

## 2016-07-11 NOTE — Telephone Encounter (Signed)
Okay to give her 3 month supply of Lyrica.Amy, if we have some samples of Lyrica and we can share with the patient, please offered to the patient as well.Thank you

## 2016-07-11 NOTE — Telephone Encounter (Signed)
02/29/16 last  08/21/16 next visit  36m supply was sent in on 05/03/16 Patient states she did not get the RX that was sent in and would like this now, ok to resend ?

## 2016-07-11 NOTE — Telephone Encounter (Signed)
Patient left a voicemail stating [place] faxed over a refill request for Lyrica and she is requesting that the refill be sent to the pharmacy today so she can pick it up.

## 2016-07-11 NOTE — Telephone Encounter (Signed)
Patient states you were going to send her information about patient assistance for Lyrica, she has not received this. She wanted me to remind you. I do not think she has access to a computer to pull this up on her own.

## 2016-07-11 NOTE — Telephone Encounter (Signed)
I provided patient with the form for the Lyrica patient assistance program on 05/01/16.  I attempted to call patient to follow up.  There was no answer and her voicemail was full.     Elisabeth Most, Pharm.D., BCPS Clinical Pharmacist Pager: [phone] Phone: [phone] 07/11/2016 4:05 PM

## 2016-07-12 NOTE — Telephone Encounter (Signed)
Second attempt to reach patient.  Voicemail was full.

## 2016-07-12 NOTE — Telephone Encounter (Signed)
Received return call from patient.  Patient reports she never received the form for Lyrica patient assistance program.  I advised patient that I have a new form for her.  Discussed having her come sign the form in the office for it to be faxed.  Patient reports she does not want to come by the office due to the cold weather.  She asked that I mail for to her.  Will mail form to patient's home.     Elisabeth Most, Pharm.D., BCPS Clinical Pharmacist Pager: [phone] Phone: [phone] 07/12/2016 11:07 AM

## 2016-07-12 NOTE — Telephone Encounter (Signed)
Patient spoke with Dr. Elisabeth Most regarding the form for Lyrica that she had not received in October. Patient has been offered samples and states she will come by the office to pick up when it warms up just a little.

## 2016-07-15 ENCOUNTER — Other Ambulatory Visit: Payer: Self-pay | Admitting: Rheumatology

## 2016-07-17 ENCOUNTER — Other Ambulatory Visit: Payer: Self-pay | Admitting: *Deleted

## 2016-07-17 NOTE — Telephone Encounter (Signed)
Last Visit: 02/29/16 Next Visit: 08/21/16 UDS: 05/03/16 Narc Agreement: 03/16/16  Okay to refill Tramadol?

## 2016-08-01 ENCOUNTER — Other Ambulatory Visit: Payer: Self-pay | Admitting: Interventional Cardiology

## 2016-08-01 DIAGNOSIS — H5201 Hypermetropia, right eye: Secondary | ICD-10-CM | POA: Diagnosis not present

## 2016-08-01 DIAGNOSIS — H2513 Age-related nuclear cataract, bilateral: Secondary | ICD-10-CM | POA: Diagnosis not present

## 2016-08-01 DIAGNOSIS — H5212 Myopia, left eye: Secondary | ICD-10-CM | POA: Diagnosis not present

## 2016-08-03 NOTE — Telephone Encounter (Signed)
**Note De-Identified  Obfuscation** Please refer to PCP. Thanks.

## 2016-08-03 NOTE — Telephone Encounter (Signed)
As patient is prn follow up with Dr Irish Lack, should this be deferred to patients pcp? Please advise. Thanks, MI

## 2016-08-18 ENCOUNTER — Other Ambulatory Visit: Payer: Self-pay | Admitting: *Deleted

## 2016-08-18 ENCOUNTER — Telehealth: Payer: Self-pay | Admitting: Rheumatology

## 2016-08-18 MED ORDER — PREGABALIN 75 MG PO CAPS: 75.0000 mg | ORAL_CAPSULE | Freq: Two times a day (BID) | ORAL | 5 refills | Status: DC

## 2016-08-18 NOTE — Telephone Encounter (Signed)
Patient is requesting Lyrica be resent to [name] on [address], she says they have not received it yet.

## 2016-08-18 NOTE — Telephone Encounter (Signed)
faxed

## 2016-08-18 NOTE — Telephone Encounter (Signed)
Last Visit: 02/29/16 Next Visit: 08/21/16  Okay to refill Lyrica?

## 2016-08-21 ENCOUNTER — Ambulatory Visit: Payer: PPO | Admitting: Rheumatology

## 2016-08-22 ENCOUNTER — Other Ambulatory Visit: Payer: Self-pay | Admitting: *Deleted

## 2016-08-22 MED ORDER — ZOLPIDEM TARTRATE ER 12.5 MG PO TBCR: 12.5000 mg | EXTENDED_RELEASE_TABLET | Freq: Every evening | ORAL | 0 refills | Status: DC | PRN

## 2016-08-22 NOTE — Telephone Encounter (Signed)
Refill received via fax for Ambien CR  Last Visit: 02/29/16 Next Visit: 09/11/16  Okay to refill Ambien CR?

## 2016-09-11 ENCOUNTER — Encounter: Payer: Self-pay | Admitting: Rheumatology

## 2016-09-11 ENCOUNTER — Ambulatory Visit (INDEPENDENT_AMBULATORY_CARE_PROVIDER_SITE_OTHER): Payer: PPO | Admitting: Rheumatology

## 2016-09-11 VITALS — BP 110/62 | HR 65 | Resp 12 | Ht 65.0 in | Wt 156.0 lb

## 2016-09-11 DIAGNOSIS — M1712 Unilateral primary osteoarthritis, left knee: Secondary | ICD-10-CM | POA: Diagnosis not present

## 2016-09-11 DIAGNOSIS — F5101 Primary insomnia: Secondary | ICD-10-CM | POA: Diagnosis not present

## 2016-09-11 DIAGNOSIS — Z8739 Personal history of other diseases of the musculoskeletal system and connective tissue: Secondary | ICD-10-CM | POA: Insufficient documentation

## 2016-09-11 DIAGNOSIS — R5383 Other fatigue: Secondary | ICD-10-CM | POA: Diagnosis not present

## 2016-09-11 DIAGNOSIS — M797 Fibromyalgia: Secondary | ICD-10-CM

## 2016-09-11 MED ORDER — PREGABALIN 75 MG PO CAPS: 75.0000 mg | ORAL_CAPSULE | Freq: Three times a day (TID) | ORAL | 5 refills | Status: DC

## 2016-09-11 MED ORDER — LIDOCAINE 5 % EX PTCH: 1.0000 | MEDICATED_PATCH | CUTANEOUS | 3 refills | Status: DC

## 2016-09-11 NOTE — Progress Notes (Signed)
I spoke to patient during visit regarding Lyrica patient assistance program.  Patient confirms she did receive the application I mailed to her in January, but reports that she has not completed the application yet.  Patient reports she sometimes has trouble affording Lyrica.  Patient is currently taking Lyrica 75 mg BID and gabapentin 100 mg at bedtime as she reports she is unable to afford Lyrica three times daily.  Discussed using gabapentin three times daily instead of Lyrica, but patient reports she was previously on gabapentin TID and it did not provider her with as much relief.  She wants to continue Lyrica.  I reviewed patient's insurance plan and noted that Lyrica copay is the same for the patient whether her dose is BID or TID ($45 for 30 day or $90 for 90 day supply).  Advised that she may reach the coverage gap quicker if Lyrica dose is increased, which would increase the cost of Lyrica which she is in the gap.  Advised that applying for Lyrica patient assistance may prevent her from going into the coverage gap.  Reviewed patient's medications using medicare.gov and noted that the cost of her other medications should not significant increase in the coverage gap.  Discussed the concern of using both Lyrica and gabapentin.  Patient is agreeable to discontinue gabapentin and change to Lyrica 75 mg TID.  Changed was discussed with Mr. Carlyon Shadow who discontinued gabapentin and changed Lyrica to 75 mg TID.  Medication Samples have been provided to the patient.  Drug name: Lyrica       Strength: 75 mg        Qty: 42 capsules  LOT: [id number]  Exp.Date: 05/2018  Dosing instructions: Take 1 capsule by mouth three times daily.    The patient has been instructed regarding the correct time, dose, and frequency of taking this medication, including desired effects and most common side effects.  Patient is still interested in applying for the Lyrica patient assistance in the future.  Advised patient to complete  application and bring back to our office.  Patient voiced understanding.    Reviewed patient's other medications.  Counseled patient on increased risk of CNS depression with tramadol, diazepam, Lyrica, and zolpidem.  Patient confirms she only takes diazepam approximately once monthly.  Advised patient to avoid tramadol when she takes diazepam.  Also counseled patient on the purpose, proper use, and adverse effects of zolpidem, advised to only use at bedtime as needed.  Discussed increased risk of falls, fractures, and delirium in elderly.  Patient voiced understanding. Patient denies any questions or concerns regarding her medications at this time.    Cyndia Skeeters 12:43 PM 09/11/2016

## 2016-09-11 NOTE — Progress Notes (Signed)
Office Visit Note  Patient: [name]             Date of Birth: [birth year]           MRN: [id number]             PCP: Osborne Casco, MD Referring: Kelton Pillar, MD Visit Date: 09/11/2016 Occupation: @GUAROCC @    Subjective:  Follow-up Fibromyalgia, fatigue, insomnia  History of Present Illness: [name] is a 76 y.o. female  Last seen 02/29/2016. Patient states that her fibromyalgia is about the same as in the past. She rates her discomfort as a 7 on a scale of 0-10. This pain level is despite doing fibromyalgia exercises at [facility] with the research facility for fibromyalgia. She also walks her dog on a daily basis.   Patient currently is taking Lyrica 75 mg twice a day in the evening dose, she is taking gabapentin. We discussed stopping the gabapentin as a evening dose and instead using Lyrica 75 mg 3 times a day. Patient is agreeable.  Reviewing her insurance information, patient realizes that she will be able to afford Lyrica 3 times a day. She was unaware that it costs the same whether she takes it 2 times a day or 3 times a day.  Patient also takes Ambien CR 12.5 mg daily at bedtime. This helps her with her sleep  Patient also uses baclofen twice a day.  Patient also uses tramadol on a daily basis. We advised the patient the importance of minimizing the use of tramadol.   Activities of Daily Living:  Patient reports morning stiffness for 30 minutes.   Patient Reports nocturnal pain.  Difficulty dressing/grooming: Denies Difficulty climbing stairs: Denies Difficulty getting out of chair: Denies Difficulty using hands for taps, buttons, cutlery, and/or writing: Reports   Review of Systems  Constitutional: Positive for fatigue.  HENT: Negative for mouth sores and mouth dryness.   Eyes: Negative for dryness.  Respiratory: Negative for shortness of breath.   Gastrointestinal: Negative for constipation and diarrhea.  Musculoskeletal: Positive for  myalgias and myalgias.  Skin: Negative for sensitivity to sunlight.  Psychiatric/Behavioral: Positive for sleep disturbance. Negative for decreased concentration.    PMFS History:  Patient Active Problem List   Diagnosis Date Noted  . Fibromyalgia syndrome 09/11/2016  . Other fatigue 09/11/2016  . Primary insomnia 09/11/2016  . History of osteopenia 09/11/2016  . OA (osteoarthritis) of knee 11/08/2015  . Essential hypertension, benign 12/16/2013  . Pure hypercholesterolemia 12/16/2013    Past Medical History:  Diagnosis Date  . Benign essential tremor   . Cancer [facility])    h/o skin cancer  . Chronic kidney disease   . Essential hypertension, benign   . Fibromyalgia   . GERD (gastroesophageal reflux disease)   . Heart murmur   . Hemorrhoids   . Hyponatremia    secondary to hctz-recurrent March 2010  . Low back pain    facet joint pain  . Occipital neuralgia   . Osteoarthritis   . Osteopenia   . Pure hypercholesterolemia 12/16/2013  . Shortness of breath dyspnea   . Tuberculosis    as a child    Family History  Problem Relation Age of Onset  . Heart disease Father   . Cancer Father     prostate  . Heart disease Mother     congestive heart disease  . Cancer - Ovarian Mother   . Stroke Sister    Past Surgical History:  Procedure  Laterality Date  . TOTAL KNEE ARTHROPLASTY Left 11/08/2015   Procedure: LEFT TOTAL KNEE ARTHROPLASTY;  Surgeon: Gaynelle Arabian, MD;  Location: [facility];  Service: Orthopedics;  Laterality: Left;  . TUBAL LIGATION     Social History   Social History Narrative   Denies caffeine use      Objective: Vital Signs: BP 110/62   Pulse 65   Resp 12   Ht 5' 5"  (1.651 m)   Wt 156 lb (70.8 kg)   BMI 25.96 kg/m    Physical Exam  Constitutional: She is oriented to person, place, and time. She appears well-developed and well-nourished.  HENT:  Head: Normocephalic and atraumatic.  Eyes: EOM are normal. Pupils are equal, round, and reactive to  light.  Cardiovascular: Normal rate, regular rhythm and normal heart sounds.  Exam reveals no gallop and no friction rub.   No murmur heard. Pulmonary/Chest: Effort normal and breath sounds normal. She has no wheezes. She has no rales.  Abdominal: Soft. Bowel sounds are normal. She exhibits no distension. There is no tenderness. There is no guarding. No hernia.  Musculoskeletal: Normal range of motion. She exhibits no edema, tenderness or deformity.  Lymphadenopathy:    She has no cervical adenopathy.  Neurological: She is alert and oriented to person, place, and time. Coordination normal.  Skin: Skin is warm and dry. Capillary refill takes less than 2 seconds. No rash noted.  Psychiatric: She has a normal mood and affect. Her behavior is normal.  Nursing note and vitals reviewed.    Musculoskeletal Exam:  Full range of motion of all joints Grip strength is equal and strong bilaterally For myalgia tender points are 18 out of 18 positive  CDAI Exam: No CDAI exam completed.  No synovitis on exam  Investigation: Findings:  September 2016:  CBC and comprehensive metabolic panel were normal.  Urine drug screen was positive for benzodiazepines.  Vitamin D was low at 17.  last bone density was done on 04/30/2014  due for a repeat bone density, and we will order it after 05/03/2016  Lab on 05/03/2016  Component Date Value Ref Range Status  . Sodium 05/03/2016 133* 135 - 146 mmol/L Final  . Potassium 05/03/2016 5.1  3.5 - 5.3 mmol/L Final  . Chloride 05/03/2016 98  98 - 110 mmol/L Final  . CO2 05/03/2016 25  20 - 31 mmol/L Final  . Glucose, Bld 05/03/2016 101* 65 - 99 mg/dL Final  . BUN 05/03/2016 11  7 - 25 mg/dL Final  . Creat 05/03/2016 1.10* 0.60 - 0.93 mg/dL Final   Comment:   For patients > or = 77 years of age: The upper reference limit for Creatinine is approximately 13% higher for people identified as African-American.     . Total Bilirubin 05/03/2016 0.5  0.2 - 1.2  mg/dL Final  . Alkaline Phosphatase 05/03/2016 95  33 - 130 U/L Final  . AST 05/03/2016 26  10 - 35 U/L Final  . ALT 05/03/2016 16  6 - 29 U/L Final  . Total Protein 05/03/2016 6.5  6.1 - 8.1 g/dL Final  . Albumin 05/03/2016 4.0  3.6 - 5.1 g/dL Final  . Calcium 05/03/2016 9.4  8.6 - 10.4 mg/dL Final  . GFR, Est African American 05/03/2016 56* >=60 mL/min Final  . GFR, Est Non African American 05/03/2016 49* >=60 mL/min Final  . WBC 05/03/2016 5.1  3.8 - 10.8 K/uL Final  . RBC 05/03/2016 4.44  3.80 - 5.10 MIL/uL Final  .  Hemoglobin 05/03/2016 14.8  11.7 - 15.5 g/dL Final  . HCT 05/03/2016 43.7  35.0 - 45.0 % Final  . MCV 05/03/2016 98.4  80.0 - 100.0 fL Final  . MCH 05/03/2016 33.3* 27.0 - 33.0 pg Final  . MCHC 05/03/2016 33.9  32.0 - 36.0 g/dL Final  . RDW 05/03/2016 14.4  11.0 - 15.0 % Final  . Platelets 05/03/2016 242  140 - 400 K/uL Final  . MPV 05/03/2016 11.2  7.5 - 12.5 fL Final  . Neutro Abs 05/03/2016 2397  1,500 - 7,800 cells/uL Final  . Lymphs Abs 05/03/2016 2142  850 - 3,900 cells/uL Final  . Monocytes Absolute 05/03/2016 510  200 - 950 cells/uL Final  . Eosinophils Absolute 05/03/2016 51  15 - 500 cells/uL Final  . Basophils Absolute 05/03/2016 0  0 - 200 cells/uL Final  . Neutrophils Relative % 05/03/2016 47  % Final  . Lymphocytes Relative 05/03/2016 42  % Final  . Monocytes Relative 05/03/2016 10  % Final  . Eosinophils Relative 05/03/2016 1  % Final  . Basophils Relative 05/03/2016 0  % Final  . Smear Review 05/03/2016 Criteria for review not met   Final  . Prescribed Drug 1 05/03/2016 Tramadol   Final  . Desmethyltramadol 05/03/2016 30730* <100 ng/mL Final  . [facility] Desmethyltram 05/03/2016 CONSISTENT   Final  . Tramadol 05/03/2016 >50000* <100 ng/mL Final  . medMATCH Tramadol 05/03/2016 CONSISTENT   Final  . See Note: 05/03/2016     Final   Comment: This drug testing is for medical treatment only.   Analysis was performed as non-forensic testing and  these  results should be used only by healthcare  providers to render diagnosis or treatment, or to  monitor progress of medical conditions.   [facility] comments are:  - present when drug test results may be the result of     metabolism of one or more drugs or when results are     inconsistent with prescribed medication(s) listed.  - may be blank when drug results are consistent with     prescribed medication(s) listed.   For assistance with interpreting these drug results,  please contact a [employer] Toxicology  Specialist: [phone] [place] [phone]), M-F,  8am-6pm EST.   Marland Kitchen Amphetamines 05/03/2016 NEGATIVE  500 ng/mL Final   Comment: This drug testing is for medical treatment only. Analysis was performed as non-forensic testing and these results should be used only by healthcare providers to render diagnosis or treatment, or to monitor progress of medical conditions.   For assistance with interpreting these drug results, please contact a [employer] Toxicology Specialist: [phone] [place] [phone]), M-F, 8am-6pm EST.   . Barbiturates 05/03/2016 NEGATIVE  300 ng/mL Final   Comment: This drug testing is for medical treatment only. Analysis was performed as non-forensic testing and these results should be used only by healthcare providers to render diagnosis or treatment, or to monitor progress of medical conditions.   For assistance with interpreting these drug results, please contact a [employer] Toxicology Specialist: [phone] [place] [phone]), M-F, 8am-6pm EST.   . Benzodiazepines 05/03/2016 CANCELED   Final   Comment: Test not performed. Quantity not sufficient.  Result canceled by the ancillary   . Cocaine Metabolite 05/03/2016 NEGATIVE  150 ng/mL Final   Comment: This drug testing is for medical treatment only. Analysis was performed as non-forensic testing and these results should be used only by healthcare providers to render  diagnosis or treatment, or to monitor progress  of medical conditions.   For assistance with interpreting these drug results, please contact a [employer] Toxicology Specialist: [phone] [place] [phone]), M-F, 8am-6pm EST.   . Marijuana Metabolite 05/03/2016 NEGATIVE  20 ng/mL Final   Comment: This drug testing is for medical treatment only. Analysis was performed as non-forensic testing and these results should be used only by healthcare providers to render diagnosis or treatment, or to monitor progress of medical conditions.   For assistance with interpreting these drug results, please contact a [employer] Toxicology Specialist: [phone] [place] [phone]), M-F, 8am-6pm EST.   . Methadone Metabolite 05/03/2016 NEGATIVE  100 ng/mL Final   Comment: This drug testing is for medical treatment only. Analysis was performed as non-forensic testing and these results should be used only by healthcare providers to render diagnosis or treatment, or to monitor progress of medical conditions.   For assistance with interpreting these drug results, please contact a [employer] Toxicology Specialist: [phone] [place] [phone]), M-F, 8am-6pm EST.   Marland Kitchen Opiates 05/03/2016 CANCELED   Final   Comment: Test not performed. Quantity not sufficient.  Result canceled by the ancillary   . Oxycodone 05/03/2016 NEGATIVE  100 ng/mL Final   Comment: This drug testing is for medical treatment only. Analysis was performed as non-forensic testing and these results should be used only by healthcare providers to render diagnosis or treatment, or to monitor progress of medical conditions.   For assistance with interpreting these drug results, please contact a [employer] Toxicology Specialist: [phone] [place] [phone]), M-F, 8am-6pm EST.   Marland Kitchen Phencyclidine 05/03/2016 NEGATIVE  25 ng/mL Final   Comment: This drug testing is for medical treatment  only. Analysis was performed as non-forensic testing and these results should be used only by healthcare providers to render diagnosis or treatment, or to monitor progress of medical conditions.   For assistance with interpreting these drug results, please contact a [employer] Toxicology Specialist: [phone] [place] [phone]), M-F, 8am-6pm EST.   Marland Kitchen Fentanyl 05/03/2016 NEGATIVE  <0.5 ng/mL Final  . Norfentanyl 05/03/2016 NEGATIVE  <0.5 ng/mL Final  . Meprobamate 05/03/2016 NEGATIVE  <1000 ng/mL Final  . Meperidine 05/03/2016 NEGATIVE  <100 ng/mL Final  . Normeperidine 05/03/2016 NEGATIVE  <100 ng/mL Final  . Desmethyltramadol 05/03/2016 30730* 100 ng/mL Final  . Tramadol 05/03/2016 >50000* 100 ng/mL Final   Comment: This drug testing is for medical treatment only. Analysis was performed as non-forensic testing and these results should be used only by healthcare providers to render diagnosis or treatment, or to monitor progress of medical conditions.   [facility] comments are:  - present when drug test results may be the result of    metabolism of one or more drugs or when results are    inconsistent with prescribed medication(s) listed.  - may be blank when drug results are consistent with    prescribed medication(s) listed.   For assistance with interpreting these drug results, please contact a [employer] Toxicology Specialist: [phone] [place] [phone]), M-F, 8am-6pm EST.   Marland Kitchen MDMA 05/03/2016 NEGATIVE  500 ng/mL Final   Comment: This drug testing is for medical treatment only. Analysis was performed as non-forensic testing and these results should be used only by healthcare providers to render diagnosis or treatment, or to monitor progress of medical conditions.   For assistance with interpreting these drug results, please contact a [employer] Toxicology Specialist: [phone] [place] [phone]), M-F, 8am-6pm EST.   . Tapentadol  05/03/2016 NEGATIVE  <50 ng/mL Final  . Nortapentadol 05/03/2016  NEGATIVE  <50 ng/mL Final  . ZOLPIDEM 05/03/2016 60* <5 ng/mL Final  . ZOLPIDEM METABOLITE 05/03/2016 1630* <5 ng/mL Final  . Buprenorphine 05/03/2016 NEGATIVE CONFIRMED  5 ng/mL Final  . Buprenorphine 05/03/2016 NEGATIVE  2 ng/mL Final  . Norbuprenorphine 05/03/2016 NEGATIVE  2 ng/mL Final  . Naloxone 05/03/2016 NEGATIVE  2 ng/mL Final   Comment: This drug testing is for medical treatment only. Analysis was performed as non-forensic testing and these results should be used only by healthcare providers to render diagnosis or treatment, or to monitor progress of medical conditions.   For assistance with interpreting these drug results, please contact a [employer] Toxicology Specialist: [phone] [place] [phone]), M-F, 8am-6pm EST.   Footnotes:  (1)   This drug testing is for medical treatment only. Analysis was performed as non-forensic testing and these results should be used only by healthcare providers to render diagnosis or treatment, or to monitor progress of medical conditions.   For assistance with interpreting these drug results, please contact a [employer] Toxicology Specialist: [phone] [place] [phone]), M-F, 8am-6pm EST.         Imaging: No results found.  Speciality Comments: No specialty comments available.   Procedures:  No procedures performed Allergies: Hctz [hydrochlorothiazide]; Lisinopril; and Sulfa antibiotics   Assessment / Plan:     Visit Diagnoses: Fibromyalgia syndrome  Primary osteoarthritis of left knee  Other fatigue  Primary insomnia  History of osteopenia   Plan: #1: Fibromyalgia syndrome. Rates her discomfort as a 7 on a scale of 0-10. Doing fibromyalgia classes at [place] for fibromyalgia 3 times a week under the direction of the researcher.  #2: Fatigue and insomnia. Patient is getting good relief with Ambien CR 12.5 mg  daily at bedtime  #3: Please see the samples that we gave the patient below PENNSAID SAMPLE LARGE BOX GIVEN Lyirca sample 3 boxes ( 14 tablets each box)  #4: Patient wants to return to clinic in 3 months to discuss the following We've asked her to discontinue the gabapentin at night and instead substitute back Lyrica. The goal is to keep the patient on Lyrica 3 times a day. Patient was using gabapentin at night to "save money". Patient was told by Dr. Koleen Nimrod that she can get Lyrica 3 times a day for the same price.  Patient is going to minimize the use of tramadol as much as possible. We hopeful that the Lyrica will manage the patient's pain well and as a result patient will need to take less tramadol. Patient will also have the option of taking baclofen 3 times a day instead of 2 times a day. The baclofen does help the patient and perhaps to 3 times a day dosing will be effective for the patient. Again, we want to minimize the amount of tramadol that she uses.  Patient also will start doing water aerobics. Currently she is doing fibromyalgia exercise with the research doctor at [place] times a week. I've asked her to see if she can substitute one of those exercise times with water aerobics. If she does that over one or 2 months, and she does well with that. And her overall fibromyalgia pain is decreasing from her current pain level of 7, she may benefit from doing more water aerobics. Ideally, I would like the patient to do water aerobics 3 times a week if she can tolerate that especially for pain level goes down. She will maintain a weekly record of her pain level from fibromyalgia.  We will discuss all of the above at her next follow-up visit in 3 months.  #5: We also want to discuss patient's bone density. She is due for repeat bone density. Review of her [facility] records and Epic does not show an updated bone density. I sent a message to Seth Bake to call the patient and verify if one has been  done recently or not. If not, we should schedule one. Please see the message for full details  #6: Patient requests a refill on Lidoderm patches. She does well with Lidoderm patches. She uses them 12 hours on 12 hours off sparingly. She states that one box last a long time.  Orders: No orders of the defined types were placed in this encounter.  Meds ordered this encounter  Medications  . lidocaine (LIDODERM) 5 %    Sig: Place 1 patch onto the skin daily. Remove & Discard patch within 12 hours or as directed by MD    Dispense:  30 patch    Refill:  3    Order Specific Question:   Supervising Provider    Answer:   Lyda Perone    Face-to-face time spent with patient was 30 minutes. 50% of time was spent in counseling and coordination of care.  Follow-Up Instructions: Return in about 3 months (around 12/12/2016).   Eliezer Lofts, PA-C  Note - This record has been created using [employer].  Chart creation errors have been sought, but may not always  have been located. Such creation errors do not reflect on  the standard of medical care.

## 2016-09-12 ENCOUNTER — Telehealth: Payer: Self-pay

## 2016-09-12 NOTE — Telephone Encounter (Signed)
Received a fax from [facility] regarding a prior authorization DENIAL for Lidocaine 5% patches.  Phone [phone]  Will send document to epic  Let message for patient to call back with a update and inform her about using a goodrx coupon or using OTC lidocaine patches. Lucerito Rosinski, [place], CPhT  2:41 PM

## 2016-09-13 NOTE — Telephone Encounter (Signed)
Patient returned your call.  Cb#[phone].  Thank you.

## 2016-09-13 NOTE — Telephone Encounter (Signed)
Returned patient's call and updated her on the results from the prior authorization. She was not interested in paying for it the patches out of pocket. At this time she plans to use some Voltaren gel that she already has at home.

## 2016-09-13 NOTE — Telephone Encounter (Signed)
Spoke to patient regarding Lidoderm patches.  Discussed using over the counter lidocaine patches.  Informed patient that [facility] makes a lidocaine plus menthol patient and patient reports she will do trial of that patch.  Noted patient was given Pennsaid while in the office on 09/11/16.  Counseled patient that Voltaren gel and Pennsaid are the same medication and should not be used together.  Patient voiced understanding and denied any questions at this time.   Elisabeth Most, Pharm.D., BCPS, CPP Clinical Pharmacist Pager: [phone] Phone: [phone] 09/13/2016 12:41 PM

## 2016-09-14 ENCOUNTER — Telehealth: Payer: Self-pay | Admitting: *Deleted

## 2016-09-14 NOTE — Telephone Encounter (Signed)
-----   Message from Eliezer Lofts, [place] sent at 09/11/2016 12:22 PM EST ----- Patient was due to get a bone density back in October 2017. We set up a referral. see SRS for full details.  Please asked patient if she got it done.  If not, please reschedule the patient for bone density. She has osteopenia according to her per past records.  T score is -1.8 04/30/2014 She can get the bone density done anytime between now and her next office visit in 3 months. Please set up the bone density at the same place that she had the one from 04/30/2014 done.

## 2016-09-14 NOTE — Telephone Encounter (Signed)
Patient states she has not had this done yet. Patient states her PCP orders this and she is going to call her PCP to have them set it up for her and have a copy of results sent to our office.

## 2016-10-15 ENCOUNTER — Other Ambulatory Visit: Payer: Self-pay | Admitting: Rheumatology

## 2016-10-16 NOTE — Telephone Encounter (Signed)
Last Visit: 09/11/16 Next Visit 12/15/16  Okay to refill Gabapentin?

## 2016-10-31 ENCOUNTER — Other Ambulatory Visit: Payer: Self-pay | Admitting: Rheumatology

## 2016-11-01 NOTE — Telephone Encounter (Signed)
Last visit 09/11/16 Next visit 11/09/16 UDS 05/03/16 Narcotic agreement on file Ok to refill Tramadol ? Quantity is 540 on this Rx this is a 90 day supply  Last Rx was written 07/17/16

## 2016-11-09 ENCOUNTER — Ambulatory Visit (INDEPENDENT_AMBULATORY_CARE_PROVIDER_SITE_OTHER): Payer: PPO | Admitting: Rheumatology

## 2016-11-09 ENCOUNTER — Encounter: Payer: Self-pay | Admitting: Rheumatology

## 2016-11-09 VITALS — BP 124/60 | HR 74 | Resp 14 | Wt 153.0 lb

## 2016-11-09 DIAGNOSIS — M1712 Unilateral primary osteoarthritis, left knee: Secondary | ICD-10-CM

## 2016-11-09 DIAGNOSIS — M797 Fibromyalgia: Secondary | ICD-10-CM

## 2016-11-09 DIAGNOSIS — R5383 Other fatigue: Secondary | ICD-10-CM | POA: Diagnosis not present

## 2016-11-09 DIAGNOSIS — F5101 Primary insomnia: Secondary | ICD-10-CM | POA: Diagnosis not present

## 2016-11-09 MED ORDER — PREGABALIN 75 MG PO CAPS: 75.0000 mg | ORAL_CAPSULE | Freq: Three times a day (TID) | ORAL | 2 refills | Status: DC

## 2016-11-09 MED ORDER — ZOLPIDEM TARTRATE ER 12.5 MG PO TBCR: 12.5000 mg | EXTENDED_RELEASE_TABLET | Freq: Every evening | ORAL | 0 refills | Status: DC | PRN

## 2016-11-09 NOTE — Progress Notes (Signed)
Office Visit Note  Patient: [name]             Date of Birth: 13-Jun-1941           MRN: [id number]             PCP: Osborne Casco, MD Referring: Kelton Pillar, MD Visit Date: 11/09/2016 Occupation: @GUAROCC @     Subjective:  Follow-up (Patient using Gabapentin bid and Lyrica 75 qd) and Medication Refill (asking for Ambien CR )   History of Present Illness: [name] is a 76 y.o. female with a history of fibromyalgia. Currently she describes her fibromyalgia and a bad day as FMS = 8 ON A BAD DAY  At her fibromyalgia on a good day is described as FMS =  ON A GOOD DAY  Lately, she's been having more bad days than good days.  We are concerned about the patient being on gabapentin and as well as Lyrica. Unfortunately, patient cannot afford the Lyrica but gets very good relief with Lyrica. She only gets partial relief with gabapentin. Due to the cost issue, she is using gabapentin at certain times of the day. In Lyrica at other times of the day.  Patient has signed up for water aerobics which she plans to do as soon as possible. She is also signed up for [facility] and will start exercising soon. I cautioned the patient to not overexercise. I advised her to limit 150 minutes per week. Patient is agreeable.  Patient's complaining of hand pain lately. There is no associated swelling at this time but the pain can get intense. She does use Voltaren gel at times with partial relief.  Activities of Daily Living:  Patient reports morning stiffness for 15 minutes.   Patient Denies nocturnal pain.  Difficulty dressing/grooming: Denies Difficulty climbing stairs: Denies Difficulty getting out of chair: Denies Difficulty using hands for taps, buttons, cutlery, and/or writing: Denies   Review of Systems  Constitutional: Positive for fatigue.  HENT: Negative for mouth sores and mouth dryness.   Eyes: Negative for dryness.  Respiratory: Negative for shortness of breath.     Gastrointestinal: Negative for constipation and diarrhea.  Musculoskeletal: Positive for myalgias and myalgias.  Skin: Negative for sensitivity to sunlight.  Psychiatric/Behavioral: Positive for sleep disturbance. Negative for decreased concentration.    PMFS History:  Patient Active Problem List   Diagnosis Date Noted  . Fibromyalgia syndrome 09/11/2016  . Other fatigue 09/11/2016  . Primary insomnia 09/11/2016  . History of osteopenia 09/11/2016  . OA (osteoarthritis) of knee 11/08/2015  . Essential hypertension, benign 12/16/2013  . Pure hypercholesterolemia 12/16/2013    Past Medical History:  Diagnosis Date  . Benign essential tremor   . Cancer [facility])    h/o skin cancer  . Chronic kidney disease   . Essential hypertension, benign   . Fibromyalgia   . GERD (gastroesophageal reflux disease)   . Heart murmur   . Hemorrhoids   . Hyponatremia    secondary to hctz-recurrent March 2010  . Low back pain    facet joint pain  . Occipital neuralgia   . Osteoarthritis   . Osteopenia   . Pure hypercholesterolemia 12/16/2013  . Shortness of breath dyspnea   . Tuberculosis    as a child    Family History  Problem Relation Age of Onset  . Heart disease Father   . Cancer Father     prostate  . Heart disease Mother     congestive heart  disease  . Cancer - Ovarian Mother   . Stroke Sister    Past Surgical History:  Procedure Laterality Date  . TOTAL KNEE ARTHROPLASTY Left 11/08/2015   Procedure: LEFT TOTAL KNEE ARTHROPLASTY;  Surgeon: Gaynelle Arabian, MD;  Location: [facility];  Service: Orthopedics;  Laterality: Left;  . TUBAL LIGATION     Social History   Social History Narrative   Denies caffeine use      Objective: Vital Signs: BP 124/60   Pulse 74   Resp 14   Wt 153 lb (69.4 kg)   BMI 25.46 kg/m    Physical Exam  Constitutional: She is oriented to person, place, and time. She appears well-developed and well-nourished.  HENT:  Head: Normocephalic and atraumatic.   Eyes: EOM are normal. Pupils are equal, round, and reactive to light.  Cardiovascular: Normal rate, regular rhythm and normal heart sounds.  Exam reveals no gallop and no friction rub.   No murmur heard. Pulmonary/Chest: Effort normal and breath sounds normal. She has no wheezes. She has no rales.  Abdominal: Soft. Bowel sounds are normal. She exhibits no distension. There is no tenderness. There is no guarding. No hernia.  Musculoskeletal: Normal range of motion. She exhibits no edema, tenderness or deformity.  Lymphadenopathy:    She has no cervical adenopathy.  Neurological: She is alert and oriented to person, place, and time. Coordination normal.  Skin: Skin is warm and dry. Capillary refill takes less than 2 seconds. No rash noted.  Psychiatric: She has a normal mood and affect. Her behavior is normal.  Nursing note and vitals reviewed.    Musculoskeletal Exam:  From   CDAI Exam: CD[facility] Homunculus Exam:   Tenderness:  RUE: wrist LUE: wrist Right hand: 1st MCP, 3rd MCP and 4th MCP Left hand: 2nd MCP, 3rd MCP, 4th MCP and 5th MCP  Joint Counts:  CDAI Tender Joint count: 9 CDAI Swollen Joint count: 0   She does have multiple joints that are very tender to bilateral hands including wrists bilaterally. However there is no synovitis.  Investigation: No additional findings.  No visits with results within 6 Month(s) from this visit.  Latest known visit with results is:  Lab on 05/03/2016  Component Date Value Ref Range Status  . Sodium 05/03/2016 133* 135 - 146 mmol/L Final  . Potassium 05/03/2016 5.1  3.5 - 5.3 mmol/L Final  . Chloride 05/03/2016 98  98 - 110 mmol/L Final  . CO2 05/03/2016 25  20 - 31 mmol/L Final  . Glucose, Bld 05/03/2016 101* 65 - 99 mg/dL Final  . BUN 05/03/2016 11  7 - 25 mg/dL Final  . Creat 05/03/2016 1.10* 0.60 - 0.93 mg/dL Final   Comment:   For patients > or = 77 years of age: The upper reference limit for Creatinine is approximately 13%  higher for people identified as African-American.     .       .       .       .       .       .       .       .       .       .       .       .       .       .       .       .       .       .       .       .       .       .       .       .       .       .       .       .       .       .       .       .       .       .       .       .          .          .          .          .          .          .          .          .          .          .       .       .       .       .       .       .          .          .       .       .       .       .       .       Marland Kitchen       Marland Kitchen  Imaging: No results found.     Speciality Comments: No specialty comments available.    Procedures:  No procedures performed Allergies: Hctz [hydrochlorothiazide]; Lisinopril; and Sulfa antibiotics   Assessment / Plan:     Visit Diagnoses: Fibromyalgia syndrome  Other fatigue  Primary insomnia  Primary osteoarthritis of left knee   HAS APPT W/ DR. VARANASI ,CARDIOLOGIST AT [facility], APPT NEXT Monday, POOR CIRCULATION AND NEEDS RE-EVALUATION.  We have discussed stopping gabapentin and switching to Lyrica 75 mg 3 times a day. Patient is agreeable. Lyrica is cost prohibitive. We will do patient assistance. That may take some time to have that process. If she needs a refill of gabapentin prior to switching over to Lyrica, I'll be happy to provide that. Our ultimate goal is to do Lyrica 3 times a day. Of course, getting Lyrica through patient assistance is contingent on the cost of the medication not contributing to her Medicare dollar limit, efficacy of the medication, getting the Lyrica on a timely basis so she can discontinue the gabapentin.  Dr. Koleen Nimrod, pharmacist, help the patient fell a patient assistance form and we plan to send it to appropriate manufacture so that her Lyrica can be affordable. It would be nice to get 75  mg 3 times a day, 90 day supply, 2 refills (this will get her through 2018). From my understanding, we will need to reapply for 2019.  Orders: No orders of the defined types were placed in this encounter.  Meds ordered this encounter  Medications  . zolpidem (AMBIEN CR) 12.5 MG CR tablet    Sig: Take 1 tablet (12.5 mg total) by mouth at bedtime as needed for sleep.    Dispense:  90 tablet    Refill:  0  . pregabalin (LYRICA) 75 MG capsule    Sig: Take 1 capsule (75 mg total) by mouth 3 (three) times daily.    Dispense:  270 capsule    Refill:  2    Pharmacy Fax [phone]    Order Specific Question:   Supervising Provider    Answer:   Bo Merino [phone]    Face-to-face time spent with patient was 30 minutes. 50% of time was spent in counseling and coordination of care.   Follow-Up Instructions: Return in about 5 months (around 04/11/2017) for FMS, FATIGUE,INSOMNIA, MCP pain-r/o synovitis.   Eliezer Lofts, PA-C  Note - This record has been created using [employer].  Chart creation errors have been sought, but may not always  have been located. Such creation errors do not reflect on  the standard of medical care.

## 2016-11-09 NOTE — Patient Instructions (Signed)
  We will schedule for ultrasound of bilateral hands and wrist at the next available ultrasound time slot.  =========================   [facility]    [email or web address]  [phone]  ==========================  Standing Labs We placed an order today for your standing lab work.    Please come back and get your standing labs as discussed  We have open lab Monday through Friday from 8:30-11:30 AM and 1:30-4 PM at the office of Dr. Tresa Moore, PA.   The office is located at [address] [place], [place], [place] [address] No appointment is necessary.   Labs are drawn by [facility].  You may receive a bill from [facility] for your lab work.

## 2016-11-09 NOTE — Progress Notes (Signed)
At the last visit, plan was to discontinue gabapentin and for patient to take Lyrica 75 mg TID.  Patient reports she did not pick up the Lyrica prescription as she was concerned about going in the donut hole.  Instead she has been taking gabapentin 100 mg BID and Lyrica 75 mg daily trying to make her Lyrica prescription stretch out.  I again advised against the use of Lyrica and gabapentin together.  I counseled patient on the purpose, proper use, and adverse effects of these medications.  Plan today is to apply for the Lyrica patient assistance program to see if patient qualifies to receive Lyrica from the manufacturer and for patient to discontinue gabapentin.  Patient brought back completed form for Lyrica patient assistance program along with her income information.  Provider portion was completed by Mr. Willa Rough and form was faxed to [phone].  Will update patient once we know status of her application.    Elisabeth Most, Pharm.D., BCPS, CPP Clinical Pharmacist Pager: [phone] Phone: [phone] 11/09/2016 12:03 PM

## 2016-11-10 ENCOUNTER — Encounter: Payer: Self-pay | Admitting: *Deleted

## 2016-11-10 ENCOUNTER — Ambulatory Visit: Payer: PPO | Admitting: Interventional Cardiology

## 2016-11-13 ENCOUNTER — Ambulatory Visit: Payer: PPO | Admitting: Interventional Cardiology

## 2016-11-15 DIAGNOSIS — Z85828 Personal history of other malignant neoplasm of skin: Secondary | ICD-10-CM | POA: Diagnosis not present

## 2016-11-15 DIAGNOSIS — B351 Tinea unguium: Secondary | ICD-10-CM | POA: Diagnosis not present

## 2016-11-15 DIAGNOSIS — L570 Actinic keratosis: Secondary | ICD-10-CM | POA: Diagnosis not present

## 2016-11-15 DIAGNOSIS — L821 Other seborrheic keratosis: Secondary | ICD-10-CM | POA: Diagnosis not present

## 2016-11-22 ENCOUNTER — Telehealth: Payer: Self-pay

## 2016-11-22 NOTE — Telephone Encounter (Signed)
Called [employer] Patient Assistance to check the status of patients application that was submitted on 11/09/16. Spoke with Jeani Hawking on 5/10 who stated that it would take at least 10 business day for an application to be processed. Spoke with Silverio Lay today who states that there is no application on file for the patient. The department is backed up at the moment and could be the reason for the delay. It may not have been uploaded in the system as of yet. She suggest to check back on Monday, Nov 27, 2016. The application was re-faxed. Will check back and update once we receive a response.   Kylle Lall, [place], CPhT 3:03 PM

## 2016-11-27 NOTE — Telephone Encounter (Signed)
Called [employer] to check on patient application status. Spoke with Jeani Hawking who states that the application has not been received from 5/3. We re-faxed application on 4/70. It will take 10 business days to process from the 16th. She does not know if the application has been received and won't know until after it has been processed (10 business days). Will check back and update.  Tanishka Drolet, [place], CPhT 10:46 AM

## 2016-11-30 ENCOUNTER — Ambulatory Visit: Payer: PPO | Admitting: Interventional Cardiology

## 2016-12-11 NOTE — Telephone Encounter (Signed)
Called [employer] to check status of patient's application. Spoke with Clarene Critchley who states that the patient has been approved through 07/09/17. She should have received her first shipment of Lyrica on 12/06/16. The clinic and the patient should receive a letter from the assistance program within a week.   Phone: [phone] Reference number: none given  Spoke with patient to update her. She said that she received 3 bottles of medication last week. I told her to expect a letter in the mail within the next week. Patient voiced understanding and denies any questions at this time.   Jaala Bohle, [place], CPhT 9:24 AM

## 2016-12-11 NOTE — Telephone Encounter (Signed)
I spoke to patient regarding Lyrica approval.  I advised that approval is through the end of the year and she will need to re-enroll annually.  Reviewed purpose, proper use, and adverse effects of Lyrica.  Patient confirms she is taking Lyrica 75 mg three times daily.  She confirms she is no longer taking gabapentin.  Patient denies any questions or concerns regarding her medications at this time.   Elisabeth Most, Pharm.D., BCPS, CPP Clinical Pharmacist Pager: [phone] Phone: [phone] 12/11/2016 9:27 AM

## 2016-12-15 ENCOUNTER — Ambulatory Visit: Payer: PPO | Admitting: Rheumatology

## 2016-12-25 NOTE — Progress Notes (Deleted)
Visit Diagnoses: Fibromyalgia syndrome  Other fatigue  Primary insomnia  Primary osteoarthritis of left knee   HAS APPT W/ DR. VARANASI ,CARDIOLOGIST AT [facility], APPT NEXT Monday, POOR CIRCULATION AND NEEDS RE-EVALUATION.  We have discussed stopping gabapentin and switching to Lyrica 75 mg 3 times a day. Patient is agreeable. Lyrica is cost prohibitive. We will do patient assistance. That may take some time to have that process. If she needs a refill of gabapentin prior to switching over to Lyrica, I'll be happy to provide that. Our ultimate goal is to do Lyrica 3 times a day. Of course, getting Lyrica through patient assistance is contingent on the cost of the medication not contributing to her Medicare dollar limit, efficacy of the medication, getting the Lyrica on a timely basis so she can discontinue the gabapentin.  Dr. Koleen Nimrod, pharmacist, help the patient fell a patient assistance form and we plan to send it to appropriate manufacture so that her Lyrica can be affordable. It would be nice to get 75 mg 3 times a day, 90 day supply, 2 refills (this will get her through 2018). From my understanding, we will need to reapply for 2019.  Orders: No orders of the defined types were placed in this encounter.       Meds ordered this encounter  Medications  . zolpidem (AMBIEN CR) 12.5 MG CR tablet    Sig: Take 1 tablet (12.5 mg total) by mouth at bedtime as needed for sleep.    Dispense:  90 tablet    Refill:  0  . pregabalin (LYRICA) 75 MG capsule    Sig: Take 1 capsule (75 mg total) by mouth 3 (three) times daily.    Dispense:  270 capsule    Refill:  2    Pharmacy Fax [phone]    Order Specific Question:   Supervising Provider    Answer:   Bo Merino [phone]    Face-to-face time spent with patient was 30 minutes. 50% of time was spent in counseling and coordination of care.   Follow-Up Instructions: Return in about 5 months (around  04/11/2017) for FMS, FATIGUE,INSOMNIA, MCP pain-r/o synovitis.   Eliezer Lofts, PA-C

## 2016-12-26 ENCOUNTER — Telehealth: Payer: Self-pay | Admitting: Rheumatology

## 2016-12-26 NOTE — Telephone Encounter (Signed)
Patient called stating that her insurance company ([employer]) told her that we need to have a prior authorization for the [place] that she is schedule for tomorrow.  CB#[phone].  Thank you.

## 2016-12-27 ENCOUNTER — Ambulatory Visit: Payer: PPO | Admitting: Rheumatology

## 2017-01-04 ENCOUNTER — Other Ambulatory Visit: Payer: Self-pay | Admitting: Interventional Cardiology

## 2017-01-04 NOTE — Telephone Encounter (Signed)
Should this be deferred to patients pcp as she has not been seen since 2016 but is prn follow up with Dr Irish Lack? Please advise. Thanks, MI

## 2017-01-04 NOTE — Telephone Encounter (Signed)
Please defer to patient's PCP. Thanks

## 2017-02-13 ENCOUNTER — Other Ambulatory Visit: Payer: Self-pay | Admitting: Rheumatology

## 2017-02-13 MED ORDER — TRAMADOL HCL 50 MG PO TABS: ORAL_TABLET | ORAL | 0 refills | Status: DC

## 2017-02-13 MED ORDER — ZOLPIDEM TARTRATE ER 12.5 MG PO TBCR
12.5000 mg | EXTENDED_RELEASE_TABLET | Freq: Every evening | ORAL | 0 refills | Status: DC | PRN
Start: 2017-02-13 — End: 2017-05-25

## 2017-02-13 NOTE — Telephone Encounter (Signed)
Patient needs refills on Zolpidem ER, and Tramadol. Patient uses [facility] in [place]. This is a new Pharmacy for patient.

## 2017-02-13 NOTE — Telephone Encounter (Signed)
Last visit 09/11/16 Next Visit: 03/21/17 UDS: 05/03/16 Narc Agreement: 03/16/16 Last Tramadol refill 11/01/16/ Last Ambien refill 11/09/16   Okay to refill Tramadol and Ambien?

## 2017-02-13 NOTE — Telephone Encounter (Signed)
ok 

## 2017-02-15 ENCOUNTER — Telehealth: Payer: Self-pay | Admitting: Rheumatology

## 2017-02-15 NOTE — Telephone Encounter (Signed)
Patient called while computers were down 02/14/17 stating [facility] has not received rx for Tramadol and Zolpidem. Patient had one Zolpidem left yesterday. Please call patient to advise.

## 2017-02-15 NOTE — Telephone Encounter (Signed)
Patient advised prescriptions have been re faxed to pharmacy.

## 2017-03-16 NOTE — Progress Notes (Deleted)
?   Hand pain   Assessment / Plan:     Visit Diagnoses: Fibromyalgia syndrome  Other fatigue  Primary insomnia  Primary osteoarthritis of left knee   HAS APPT W/ DR. VARANASI ,CARDIOLOGIST AT [facility], APPT NEXT Monday, POOR CIRCULATION AND NEEDS RE-EVALUATION.  We have discussed stopping gabapentin and switching to Lyrica 75 mg 3 times a day. Patient is agreeable. Lyrica is cost prohibitive. We will do patient assistance. That may take some time to have that process. If she needs a refill of gabapentin prior to switching over to Lyrica, I'll be happy to provide that. Our ultimate goal is to do Lyrica 3 times a day. Of course, getting Lyrica through patient assistance is contingent on the cost of the medication not contributing to her Medicare dollar limit, efficacy of the medication, getting the Lyrica on a timely basis so she can discontinue the gabapentin.  Dr. Koleen Nimrod, pharmacist, help the patient fell a patient assistance form and we plan to send it to appropriate manufacture so that her Lyrica can be affordable. It would be nice to get 75 mg 3 times a day, 90 day supply, 2 refills (this will get her through 2018). From my understanding, we will need to reapply for 2019.

## 2017-03-18 DIAGNOSIS — L237 Allergic contact dermatitis due to plants, except food: Secondary | ICD-10-CM | POA: Diagnosis not present

## 2017-03-21 ENCOUNTER — Other Ambulatory Visit: Payer: PPO | Admitting: Rheumatology

## 2017-04-06 DIAGNOSIS — M8589 Other specified disorders of bone density and structure, multiple sites: Secondary | ICD-10-CM | POA: Insufficient documentation

## 2017-04-06 NOTE — Progress Notes (Signed)
Office Visit Note  Patient: [name]             Date of Birth: 24-Feb-1941           MRN: [id number]             PCP: Kelton Pillar, MD Referring: Kelton Pillar, MD Visit Date: 04/13/2017 Occupation: @GUAROCC @    Subjective:  Lower back pain.   History of Present Illness: [name] is a 76 y.o. female history of fibromyalgia and osteoarthritis. She states she had a fall last week and she landed up on her back. She did not go to the urgent care or emergency room. She continues to have some discomfort in her lower back. She states her fibromyalgia is also flaring with increased pain and discomfort all over. She complains of hyperalgesia. Patient reports her pain to be 10 without her medications. And with the medication she can bring it down to 5 or 6. She states she's not able to function without tramadol and Lyrica.  Activities of Daily Living:  Patient reports morning stiffness for 20  minutes.   Patient Denies nocturnal pain.  Difficulty dressing/grooming: Denies Difficulty climbing stairs: Reports Difficulty getting out of chair: Reports Difficulty using hands for taps, buttons, cutlery, and/or writing: Reports   Review of Systems  Constitutional: Positive for fatigue. Negative for night sweats, weight gain, weight loss and weakness.  HENT: Positive for mouth dryness. Negative for mouth sores, trouble swallowing, trouble swallowing and nose dryness.   Eyes: Positive for dryness. Negative for pain, redness and visual disturbance.  Respiratory: Negative for cough, shortness of breath and difficulty breathing.   Cardiovascular: Negative for chest pain, palpitations, hypertension, irregular heartbeat and swelling in legs/feet.  Gastrointestinal: Positive for diarrhea. Negative for blood in stool and constipation.  Endocrine: Negative for increased urination.  Genitourinary: Negative for vaginal dryness.  Musculoskeletal: Positive for arthralgias, joint pain, muscle  weakness, morning stiffness and muscle tenderness. Negative for joint swelling, myalgias and myalgias.  Skin: Negative for color change, rash, hair loss, skin tightness, ulcers and sensitivity to sunlight.  Allergic/Immunologic: Negative.  Negative for susceptible to infections.  Neurological: Negative for dizziness, numbness, headaches, memory loss and night sweats.  Hematological: Negative for swollen glands.  Psychiatric/Behavioral: Negative.  Negative for depressed mood and sleep disturbance. The patient is not nervous/anxious.     PMFS History:  Patient Active Problem List   Diagnosis Date Noted  . Status post total knee replacement, left 04/13/2017  . Vitamin D deficiency 04/12/2017  . History of chronic kidney disease 04/12/2017  . Osteopenia of multiple sites 04/06/2017  . Fibromyalgia syndrome 09/11/2016  . Other fatigue 09/11/2016  . Primary insomnia 09/11/2016  . History of osteopenia 09/11/2016  . OA (osteoarthritis) of knee 11/08/2015  . Essential hypertension, benign 12/16/2013  . Pure hypercholesterolemia 12/16/2013    Past Medical History:  Diagnosis Date  . Benign essential tremor   . Cancer [facility])    h/o skin cancer  . Chronic kidney disease   . Essential hypertension, benign   . Fibromyalgia   . GERD (gastroesophageal reflux disease)   . Heart murmur   . Hemorrhoids   . Hyponatremia    secondary to hctz-recurrent March 2010  . Low back pain    facet joint pain  . Occipital neuralgia   . Osteoarthritis   . Osteopenia   . Pure hypercholesterolemia 12/16/2013  . Shortness of breath dyspnea   . Tuberculosis    as a child  Family History  Problem Relation Age of Onset  . Heart disease Father   . Prostate cancer Father   . Heart disease Mother   . Cancer - Ovarian Mother   . Congestive Heart Failure Mother   . Stroke Sister    Past Surgical History:  Procedure Laterality Date  . TOTAL KNEE ARTHROPLASTY Left 11/08/2015   Procedure: LEFT TOTAL KNEE  ARTHROPLASTY;  Surgeon: Gaynelle Arabian, MD;  Location: [facility];  Service: Orthopedics;  Laterality: Left;  . TUBAL LIGATION     Social History   Social History Narrative   Denies caffeine use      Objective: Vital Signs: BP (!) 99/52 (BP Location: Left Arm, Patient Position: Sitting, Cuff Size: Normal)   Pulse 62   Ht 5\' 4"  (1.626 m)   Wt 154 lb (69.9 kg)   BMI 26.43 kg/m    Physical Exam  Constitutional: She is oriented to person, place, and time. She appears well-developed and well-nourished.  HENT:  Head: Normocephalic and atraumatic.  Eyes: Conjunctivae and EOM are normal.  Neck: Normal range of motion.  Cardiovascular: Normal rate, regular rhythm, normal heart sounds and intact distal pulses.   Pulmonary/Chest: Effort normal and breath sounds normal.  Abdominal: Soft. Bowel sounds are normal.  Lymphadenopathy:    She has no cervical adenopathy.  Neurological: She is alert and oriented to person, place, and time.  Skin: Skin is warm and dry. Capillary refill takes less than 2 seconds.  Bruising was noted in the upper lumbar region.  Psychiatric: She has a normal mood and affect. Her behavior is normal.  Nursing note and vitals reviewed.    Musculoskeletal Exam: C-spine and thoracic lumbar spine good range of motion. Shoulder joints elbow joints wrist joints with good range of motion. She has some tenderness in the lower lumbar region. She also has tenderness over the right SI joint area. Shoulder joints, elbow joints, wrist joints, MCPs PIPs DIPs with good range of motion with no synovitis. She had left total knee replacement which is doing well. Right knee joint causes some discomfort. She has generalized hyperalgesia.  CDAI Exam: No CDAI exam completed.    Investigation: Findings:  UDS 04/2016 narcotic agreement 03/16/2016  CBC Latest Ref Rng & Units 05/03/2016 11/10/2015 11/09/2015  WBC 3.8 - 10.8 K/uL 5.1 10.7(H) 8.8  Hemoglobin 11.7 - 15.5 g/dL 14.8 11.4(L) 11.5(L)    Hematocrit 35.0 - 45.0 % 43.7 32.6(L) 33.0(L)  Platelets 140 - 400 K/uL 242 212 191   CMP Latest Ref Rng & Units 05/03/2016 11/10/2015 11/09/2015  Glucose 65 - 99 mg/dL 101(H) 107(H) 246(H)  BUN 7 - 25 mg/dL 11 7 7   Creatinine 0.60 - 0.93 mg/dL 1.10(H) 0.70 0.81  Sodium 135 - 146 mmol/L 133(L) 136 130(L)  Potassium 3.5 - 5.3 mmol/L 5.1 4.0 3.6  Chloride 98 - 110 mmol/L 98 101 99(L)  CO2 20 - 31 mmol/L 25 26 24   Calcium 8.6 - 10.4 mg/dL 9.4 8.7(L) 8.6(L)  Total Protein 6.1 - 8.1 g/dL 6.5 - -  Total Bilirubin 0.2 - 1.2 mg/dL 0.5 - -  Alkaline Phos 33 - 130 U/L 95 - -  AST 10 - 35 U/L 26 - -  ALT 6 - 29 U/L 16 - -    Imaging: Xr Lumbar Spine 2-3 Views  Result Date: 04/13/2017 Dextro scoliosis was noted. Multilevel spondylosis was noted. No compression fracture was noted.  Xr Pelvis 1-2 Views  Result Date: 04/13/2017 SI joints appear normal. No pelvic  fracture was noted.   Speciality Comments: No specialty comments available.    Procedures:  No procedures performed Allergies: Hctz [hydrochlorothiazide]; Lisinopril; and Sulfa antibiotics   Assessment / Plan:     Visit Diagnoses: Fibromyalgia syndrome - Patient continues to have generalized pain and discomfort and positive tender points she also has hyperalgesia. She is having a flare of fibromyalgia. On Lyrica 75 mg by mouth 3 times a day. Hypo-given her some Lyrica samples as she is having some problems with patient assistance.  Other fatigue: Due to chronic insomnia.  Primary insomnia - On Ambien 12.5 mg CR daily at bedtime when necessary. She says her sleep is better with Ambien and she cannot cut the dose further.  Status post total knee replacement, left: Doing well  Primary osteoarthritis of right knee: Right knee joint causes discomfort.  Chronic midline low back pain without sciatica - she had recent fall and has been having lower back pain. Plan: XR Lumbar Spine 2-3 Views. X-ray findings were discussed.[place]  findings were discussed.  Pelvic pain - Plan: XR Pelvis 1-2 Views  Other chronic pain - Tramadol 50 mg 1-2 tablets by mouth 3 times a day when necessary. UDS and narcotic agreement is due which we will obtain today. I've advised her to taper her tramadol as tolerated.  Osteopenia of multiple sites calcium and vitamin D as discussed.  Vitamin D deficiency  History of hypertension: Blood pressure is well controlled.  History of hyperlipidemia  History of chronic kidney disease  Medication monitoring encounter - Plan: Pain Mgmt, Profile 5 w/Conf, U, Pain Mgmt, Tramadol w/medMATCH, U    Orders: Orders Placed This Encounter  Procedures  . XR Lumbar Spine 2-3 Views  . XR Pelvis 1-2 Views  . Pain Mgmt, Profile 5 w/Conf, U  . Pain Mgmt, Tramadol w/medMATCH, U   No orders of the defined types were placed in this encounter.   Face-to-face time spent with patient was 30 minutes. Greater than 50% of time was spent in counseling and coordination of care.  Follow-Up Instructions: Return in about 6 months (around 10/12/2017) for FMS OA .   Bo Merino, MD  Note - This record has been created using Editor, commissioning.  Chart creation errors have been sought, but may not always  have been located. Such creation errors do not reflect on  the standard of medical care.

## 2017-04-12 ENCOUNTER — Ambulatory Visit: Payer: PPO | Admitting: Rheumatology

## 2017-04-12 DIAGNOSIS — E559 Vitamin D deficiency, unspecified: Secondary | ICD-10-CM | POA: Insufficient documentation

## 2017-04-12 DIAGNOSIS — Z87448 Personal history of other diseases of urinary system: Secondary | ICD-10-CM | POA: Insufficient documentation

## 2017-04-13 ENCOUNTER — Telehealth: Payer: Self-pay | Admitting: Radiology

## 2017-04-13 ENCOUNTER — Encounter: Payer: Self-pay | Admitting: Rheumatology

## 2017-04-13 ENCOUNTER — Ambulatory Visit (INDEPENDENT_AMBULATORY_CARE_PROVIDER_SITE_OTHER): Payer: PPO | Admitting: Rheumatology

## 2017-04-13 ENCOUNTER — Ambulatory Visit (INDEPENDENT_AMBULATORY_CARE_PROVIDER_SITE_OTHER): Payer: PPO

## 2017-04-13 VITALS — BP 99/52 | HR 62 | Ht 64.0 in | Wt 154.0 lb

## 2017-04-13 DIAGNOSIS — G8929 Other chronic pain: Secondary | ICD-10-CM

## 2017-04-13 DIAGNOSIS — Z5181 Encounter for therapeutic drug level monitoring: Secondary | ICD-10-CM

## 2017-04-13 DIAGNOSIS — Z96652 Presence of left artificial knee joint: Secondary | ICD-10-CM | POA: Insufficient documentation

## 2017-04-13 DIAGNOSIS — M545 Low back pain, unspecified: Secondary | ICD-10-CM

## 2017-04-13 DIAGNOSIS — R102 Pelvic and perineal pain: Secondary | ICD-10-CM | POA: Diagnosis not present

## 2017-04-13 DIAGNOSIS — F5101 Primary insomnia: Secondary | ICD-10-CM

## 2017-04-13 DIAGNOSIS — M1711 Unilateral primary osteoarthritis, right knee: Secondary | ICD-10-CM

## 2017-04-13 DIAGNOSIS — Z87448 Personal history of other diseases of urinary system: Secondary | ICD-10-CM

## 2017-04-13 DIAGNOSIS — M797 Fibromyalgia: Secondary | ICD-10-CM | POA: Diagnosis not present

## 2017-04-13 DIAGNOSIS — Z8679 Personal history of other diseases of the circulatory system: Secondary | ICD-10-CM

## 2017-04-13 DIAGNOSIS — M8589 Other specified disorders of bone density and structure, multiple sites: Secondary | ICD-10-CM | POA: Diagnosis not present

## 2017-04-13 DIAGNOSIS — R5383 Other fatigue: Secondary | ICD-10-CM | POA: Diagnosis not present

## 2017-04-13 DIAGNOSIS — E559 Vitamin D deficiency, unspecified: Secondary | ICD-10-CM

## 2017-04-13 DIAGNOSIS — Z8639 Personal history of other endocrine, nutritional and metabolic disease: Secondary | ICD-10-CM | POA: Diagnosis not present

## 2017-04-13 NOTE — Telephone Encounter (Signed)
Patient left today without her UDS, I called her and asked her to come in for this, she states she will, to you [facility]

## 2017-05-18 ENCOUNTER — Other Ambulatory Visit: Payer: Self-pay | Admitting: Rheumatology

## 2017-05-18 NOTE — Telephone Encounter (Signed)
Last Visit: 04/13/17 Next Visit: 10/12/17  Okay to refill per Dr. Estanislado Pandy

## 2017-05-25 ENCOUNTER — Other Ambulatory Visit: Payer: Self-pay | Admitting: Rheumatology

## 2017-05-25 DIAGNOSIS — I129 Hypertensive chronic kidney disease with stage 1 through stage 4 chronic kidney disease, or unspecified chronic kidney disease: Secondary | ICD-10-CM | POA: Diagnosis not present

## 2017-05-25 DIAGNOSIS — G250 Essential tremor: Secondary | ICD-10-CM | POA: Diagnosis not present

## 2017-05-25 DIAGNOSIS — Z23 Encounter for immunization: Secondary | ICD-10-CM | POA: Diagnosis not present

## 2017-05-25 DIAGNOSIS — Z0000 Encounter for general adult medical examination without abnormal findings: Secondary | ICD-10-CM | POA: Diagnosis not present

## 2017-05-25 DIAGNOSIS — E871 Hypo-osmolality and hyponatremia: Secondary | ICD-10-CM | POA: Diagnosis not present

## 2017-05-25 DIAGNOSIS — E7800 Pure hypercholesterolemia, unspecified: Secondary | ICD-10-CM | POA: Diagnosis not present

## 2017-05-25 DIAGNOSIS — M797 Fibromyalgia: Secondary | ICD-10-CM | POA: Diagnosis not present

## 2017-05-25 DIAGNOSIS — J309 Allergic rhinitis, unspecified: Secondary | ICD-10-CM | POA: Diagnosis not present

## 2017-05-25 DIAGNOSIS — N183 Chronic kidney disease, stage 3 (moderate): Secondary | ICD-10-CM | POA: Diagnosis not present

## 2017-05-25 DIAGNOSIS — M859 Disorder of bone density and structure, unspecified: Secondary | ICD-10-CM | POA: Diagnosis not present

## 2017-05-25 DIAGNOSIS — M1990 Unspecified osteoarthritis, unspecified site: Secondary | ICD-10-CM | POA: Diagnosis not present

## 2017-05-28 NOTE — Telephone Encounter (Signed)
Last Visit: 04/13/17 Next Visit: 10/12/17  Okay to refill per Dr. Estanislado Pandy

## 2017-05-29 ENCOUNTER — Other Ambulatory Visit: Payer: Self-pay | Admitting: Rheumatology

## 2017-06-14 ENCOUNTER — Other Ambulatory Visit: Payer: Self-pay

## 2017-06-14 NOTE — Telephone Encounter (Signed)
[employer] Rx assistance would like to have a new Rx for Lyrica faxed to [phone].  Stated that Rx that they have has expired.  Please include patient name, address, DOB, & ID# [id number].  Please advise.  Thank you.

## 2017-06-21 MED ORDER — PREGABALIN 75 MG PO CAPS: 75.0000 mg | ORAL_CAPSULE | Freq: Three times a day (TID) | ORAL | 2 refills | Status: DC

## 2017-06-21 NOTE — Telephone Encounter (Signed)
Last Visit: 04/13/17 Next Visit: 10/12/17  Okay to refill Lyrica?

## 2017-06-21 NOTE — Addendum Note (Signed)
Addended by: Carole Binning on: 06/21/2017 02:02 PM   Modules accepted: Orders

## 2017-06-21 NOTE — Telephone Encounter (Signed)
ok 

## 2017-06-23 DIAGNOSIS — H1013 Acute atopic conjunctivitis, bilateral: Secondary | ICD-10-CM | POA: Diagnosis not present

## 2017-07-11 ENCOUNTER — Telehealth: Payer: Self-pay

## 2017-07-11 NOTE — Telephone Encounter (Signed)
Received a letter from [place] stating that pts assistance has ended as of 07/09/2017. In order to complete the review for enrollment the prescriber's signature and date needs to be completed on the enrollment form.   Called [employer] to check the status of pts application. Spoke with Kendrick Fries who states that the pt will need to submit a new application for review.   Called patient to update. Patient states that she does not need the assistance right now and has decided not to apply. She is receiving at her pharmacy with an affordable price. If that changes in the future, she may reconsider.   Sydell Prowell, [place], CPhT 11:58 AM

## 2017-07-19 DIAGNOSIS — H9209 Otalgia, unspecified ear: Secondary | ICD-10-CM | POA: Diagnosis not present

## 2017-07-19 DIAGNOSIS — J069 Acute upper respiratory infection, unspecified: Secondary | ICD-10-CM | POA: Diagnosis not present

## 2017-08-02 ENCOUNTER — Telehealth: Payer: Self-pay | Admitting: Rheumatology

## 2017-08-02 NOTE — Telephone Encounter (Signed)
Patient left a voicemail regarding pain in her hip and checking to see if Dr. Estanislado Pandy thinks she needs an injection.  CB# [phone]

## 2017-08-03 NOTE — Telephone Encounter (Signed)
Patient states she is having pain in her right hip and feels like it is bursitis. Patient has been scheduled for 08/06/17 at 9 am

## 2017-08-03 NOTE — Progress Notes (Signed)
Office Visit Note  Patient: [name]             Date of Birth: Jun 05, 1941           MRN: [id number]             PCP: Kelton Pillar, MD Referring: Kelton Pillar, MD Visit Date: 08/06/2017 Occupation: @GUAROCC @    Subjective:  Right hip trochanteric bursitis    History of Present Illness: [name] is a 77 y.o. female with history of fibromyalgia and osteoarthritis.  Patient states that she has discomfort in her bilateral knees, bilateral ankles and feet.  She denies any joint swelling.  She takes Tramadol 50 mg two tablets in the morning, two tablets at lunch, and two tablets at bedtime.  She also takes Baclofen, Lyrica, and Ambien.  She states her fibromyalgia has been causing more generalized pain due to weather changes.  Her right trochanteric bursitis is causing significant pain.     Activities of Daily Living:  Patient reports morning stiffness for 4 hours.   Patient Reports nocturnal pain.  Difficulty dressing/grooming: Denies Difficulty climbing stairs: Reports Difficulty getting out of chair: Denies Difficulty using hands for taps, buttons, cutlery, and/or writing: Reports   Review of Systems  Constitutional: Positive for fatigue and weakness. Negative for night sweats.  HENT: Positive for ear pain. Negative for mouth sores, mouth dryness and nose dryness.   Eyes: Negative.  Negative for dryness.  Respiratory: Positive for shortness of breath. Negative for cough.   Cardiovascular: Positive for swelling in legs/feet. Negative for chest pain, palpitations and hypertension.  Gastrointestinal: Positive for diarrhea. Negative for blood in stool and constipation.  Endocrine: Positive for increased urination.  Genitourinary: Negative for painful urination.  Musculoskeletal: Positive for arthralgias, joint pain, joint swelling and morning stiffness. Negative for gait problem, myalgias, muscle weakness, muscle tenderness and myalgias.  Skin: Negative for rash, hair  loss and sensitivity to sunlight.  Allergic/Immunologic: Negative.   Neurological: Negative for dizziness, numbness, headaches and night sweats.  Hematological: Positive for bruising/bleeding tendency.  Psychiatric/Behavioral: Negative for depressed mood and sleep disturbance. The patient is not nervous/anxious.     PMFS History:  Patient Active Problem List   Diagnosis Date Noted  . Status post total knee replacement, left 04/13/2017  . Vitamin D deficiency 04/12/2017  . History of chronic kidney disease 04/12/2017  . Osteopenia of multiple sites 04/06/2017  . Fibromyalgia syndrome 09/11/2016  . Other fatigue 09/11/2016  . Primary insomnia 09/11/2016  . History of osteopenia 09/11/2016  . OA (osteoarthritis) of knee 11/08/2015  . Essential hypertension, benign 12/16/2013  . Pure hypercholesterolemia 12/16/2013    Past Medical History:  Diagnosis Date  . Benign essential tremor   . Cancer [facility])    h/o skin cancer  . Chronic kidney disease   . Essential hypertension, benign   . Fibromyalgia   . GERD (gastroesophageal reflux disease)   . Heart murmur   . Hemorrhoids   . Hyponatremia    secondary to hctz-recurrent March 2010  . Low back pain    facet joint pain  . Occipital neuralgia   . Osteoarthritis   . Osteopenia   . Pure hypercholesterolemia 12/16/2013  . Shortness of breath dyspnea   . Tuberculosis    as a child    Family History  Problem Relation Age of Onset  . Heart disease Father   . Prostate cancer Father   . Heart disease Mother   . Cancer - Ovarian  Mother   . Congestive Heart Failure Mother   . Stroke Sister    Past Surgical History:  Procedure Laterality Date  . TOTAL KNEE ARTHROPLASTY Left 11/08/2015   Procedure: LEFT TOTAL KNEE ARTHROPLASTY;  Surgeon: Gaynelle Arabian, MD;  Location: [facility];  Service: Orthopedics;  Laterality: Left;  . TUBAL LIGATION     Social History   Social History Narrative   Denies caffeine use      Objective: Vital  Signs: BP 100/60 (BP Location: Left Arm, Patient Position: Sitting, Cuff Size: Normal)   Pulse 67   Resp 15   Ht 5\' 5"  (1.651 m)   Wt 159 lb (72.1 kg)   BMI 26.46 kg/m    Physical Exam  Constitutional: She is oriented to person, place, and time. She appears well-developed and well-nourished.  HENT:  Head: Normocephalic and atraumatic.  Eyes: Conjunctivae and EOM are normal.  Neck: Normal range of motion.  Cardiovascular: Normal rate, regular rhythm, normal heart sounds and intact distal pulses.  Pulmonary/Chest: Effort normal and breath sounds normal.  Abdominal: Soft. Bowel sounds are normal.  Lymphadenopathy:    She has no cervical adenopathy.  Neurological: She is alert and oriented to person, place, and time.  Skin: Skin is warm and dry. Capillary refill takes less than 2 seconds.  Psychiatric: She has a normal mood and affect. Her behavior is normal.  Nursing note and vitals reviewed.    Musculoskeletal Exam: C-spine limited lateral rotation.  Thoracic and lumbar good ROM.  No midline spinal tenderness.  She has right SI joint tenderness.  Shoulder joints, elbow joints, wrist joints, MCPs, PIPs, and DIPs good ROM with no synovitis.  Hip joints, knee joints, ankle joints, MTPs, PIPs, and DIPs good ROM with no synovitis.  She has pedal edema bilaterally.  Her right knee has slight warmth but no effusion . She has right knee crepitus.      CDAI Exam: No CDAI exam completed.    Investigation: No additional findings.   Imaging: No results found.  Speciality Comments: No specialty comments available.    Procedures:  Large Joint Inj: R greater trochanter on 08/06/2017 10:07 AM Indications: pain Details: 27 G 1.5 in needle, lateral approach  Arthrogram: No  Medications: 1 mL lidocaine 1 %; 40 mg triamcinolone acetonide 40 MG/ML Aspirate: 0 mL Outcome: tolerated well, no immediate complications Procedure, treatment alternatives, risks and benefits explained, specific  risks discussed. Consent was given by the patient. Immediately prior to procedure a time out was called to verify the correct patient, procedure, equipment, support staff and site/side marked as required. Patient was prepped and draped in the usual sterile fashion.     Allergies: Hctz [hydrochlorothiazide]; Lisinopril; and Sulfa antibiotics   Assessment / Plan:     Visit Diagnoses: Fibromyalgia syndrome - Lyrica 75 mg by mouth 3 times a day  Other fatigue: Chronic  Primary insomnia - On Ambien 12.5 mg CR daily at bedtime when necessary.  She states the Ambien has helped her insomnia.  Good sleep hygiene was discussed.    Status post total knee replacement, left: She continues to have discomfort, especially laterally.  She has no warmth or effusion.    Primary osteoarthritis of right knee: She has mild crepitus and slight warmth.  No effusion.    Other chronic pain - Tramadol 50 mg two tablets TID.  UDS and narcotic agreement updated today.   Therapeutic drug monitoring - UDS and narcotic agreement were performed today. Plan: Pain Mgmt, Tramadol  w/medMATCH, U, Pain Mgmt, Profile 5 w/Conf, U  Trochanteric bursitis, right hip - She has tenderness of her right hip trochanteric bursa.  she performs exercises daily at home.  She requested a cortisone injection today.  She tolerated the procedure well. Plan: Large Joint Inj: R greater trochanter   Other medical conditions are listed as follows:   Osteopenia of multiple sites  History of chronic kidney disease  History of vitamin D deficiency  History of hyperlipidemia  History of hypertension: Well controlled in the office today.     Orders: Orders Placed This Encounter  Procedures  . Large Joint Inj: R greater trochanter  . Pain Mgmt, Tramadol w/medMATCH, U  . Pain Mgmt, Profile 5 w/Conf, U   No orders of the defined types were placed in this encounter.   Face-to-face time spent with patient was 30 minutes. Greater than 50%  of time was spent in counseling and coordination of care.  Follow-Up Instructions: Return in about 6 months (around 02/03/2018) for Osteoarthritis, Fibromyalgia.   Bo Merino, MD  Note - This record has been created using Editor, commissioning.  Chart creation errors have been sought, but may not always  have been located. Such creation errors do not reflect on  the standard of medical care.

## 2017-08-06 ENCOUNTER — Encounter: Payer: Self-pay | Admitting: Rheumatology

## 2017-08-06 ENCOUNTER — Ambulatory Visit: Payer: PPO | Admitting: Rheumatology

## 2017-08-06 VITALS — BP 100/60 | HR 67 | Resp 15 | Ht 65.0 in | Wt 159.0 lb

## 2017-08-06 DIAGNOSIS — Z8639 Personal history of other endocrine, nutritional and metabolic disease: Secondary | ICD-10-CM

## 2017-08-06 DIAGNOSIS — M8589 Other specified disorders of bone density and structure, multiple sites: Secondary | ICD-10-CM | POA: Diagnosis not present

## 2017-08-06 DIAGNOSIS — M797 Fibromyalgia: Secondary | ICD-10-CM | POA: Diagnosis not present

## 2017-08-06 DIAGNOSIS — Z8679 Personal history of other diseases of the circulatory system: Secondary | ICD-10-CM | POA: Diagnosis not present

## 2017-08-06 DIAGNOSIS — M7061 Trochanteric bursitis, right hip: Secondary | ICD-10-CM

## 2017-08-06 DIAGNOSIS — Z96652 Presence of left artificial knee joint: Secondary | ICD-10-CM

## 2017-08-06 DIAGNOSIS — R5383 Other fatigue: Secondary | ICD-10-CM

## 2017-08-06 DIAGNOSIS — Z87448 Personal history of other diseases of urinary system: Secondary | ICD-10-CM

## 2017-08-06 DIAGNOSIS — Z5181 Encounter for therapeutic drug level monitoring: Secondary | ICD-10-CM

## 2017-08-06 DIAGNOSIS — M1711 Unilateral primary osteoarthritis, right knee: Secondary | ICD-10-CM | POA: Diagnosis not present

## 2017-08-06 DIAGNOSIS — G8929 Other chronic pain: Secondary | ICD-10-CM | POA: Diagnosis not present

## 2017-08-06 DIAGNOSIS — F5101 Primary insomnia: Secondary | ICD-10-CM | POA: Diagnosis not present

## 2017-08-06 MED ORDER — LIDOCAINE HCL 1 % IJ SOLN
1.0000 mL | INTRAMUSCULAR | Status: AC | PRN
  Administered 2017-08-06: 1 mL

## 2017-08-06 MED ORDER — TRIAMCINOLONE ACETONIDE 40 MG/ML IJ SUSP
40.0000 mg | INTRAMUSCULAR | Status: AC | PRN
  Administered 2017-08-06: 40 mg via INTRA_ARTICULAR

## 2017-08-07 DIAGNOSIS — H5711 Ocular pain, right eye: Secondary | ICD-10-CM | POA: Diagnosis not present

## 2017-08-07 DIAGNOSIS — H02054 Trichiasis without entropian left upper eyelid: Secondary | ICD-10-CM | POA: Diagnosis not present

## 2017-08-08 NOTE — Progress Notes (Signed)
C/w

## 2017-08-10 LAB — PAIN MGMT, PROFILE 5 W/CONF, U
ALPHAHYDROXYALPRAZOLAM: NEGATIVE ng/mL (ref <25)
AMINOCLONAZEPAM: NEGATIVE ng/mL (ref <25)
Alphahydroxymidazolam: NEGATIVE ng/mL (ref <50)
Alphahydroxytriazolam: NEGATIVE ng/mL (ref <50)
Amphetamines: NEGATIVE ng/mL (ref <500)
Barbiturates: NEGATIVE ng/mL (ref <300)
Benzodiazepines: NEGATIVE ng/mL (ref <100)
CREATININE: 183.6 mg/dL
Cocaine Metabolite: NEGATIVE ng/mL (ref <150)
HYDROXYETHYLFLURAZEPAM: NEGATIVE ng/mL (ref <50)
Lorazepam: NEGATIVE ng/mL (ref <50)
MARIJUANA METABOLITE: NEGATIVE ng/mL (ref <20)
Methadone Metabolite: NEGATIVE ng/mL (ref <100)
Nordiazepam: NEGATIVE ng/mL (ref <50)
OPIATES: NEGATIVE ng/mL (ref <100)
OXIDANT: NEGATIVE ug/mL (ref <200)
OXYCODONE: NEGATIVE ng/mL (ref <100)
Oxazepam: NEGATIVE ng/mL (ref <50)
PH: 7.4 (ref 4.5–9.0)
TEMAZEPAM: NEGATIVE ng/mL (ref <50)

## 2017-08-10 LAB — PAIN MGMT, TRAMADOL W/MEDMATCH, U
DESMETHYLTRAMADOL: 28749 ng/mL — AB (ref <100)
Tramadol: >50000 ng/mL — ABNORMAL HIGH (ref <100)

## 2017-08-27 ENCOUNTER — Other Ambulatory Visit: Payer: Self-pay | Admitting: Physician Assistant

## 2017-09-12 NOTE — Telephone Encounter (Signed)
Last visit: 08/06/17 Next Visit: 10/24/17 UDS: 08/06/17 Narc Agreement: 08/06/17  Okay to refill Tramadol and Ambien?

## 2017-09-17 NOTE — Telephone Encounter (Signed)
Patient no longer wants refills. Patient discussed with Dr. Estanislado Pandy on Friday.

## 2017-10-01 NOTE — Progress Notes (Deleted)
Office Visit Note  Patient: [name]             Date of Birth: 1940-08-17           MRN: [id number]             PCP: Kelton Pillar, MD Referring: Kelton Pillar, MD Visit Date: 10/12/2017 Occupation: @GUAROCC @    Subjective:  No chief complaint on file.   History of Present Illness: [name] is a 77 y.o. female ***   Activities of Daily Living:  Patient reports morning stiffness for *** {minute/hour:19697}.   Patient {ACTIONS;DENIES/REPORTS:21021675::"Denies"} nocturnal pain.  Difficulty dressing/grooming: {ACTIONS;DENIES/REPORTS:21021675::"Denies"} Difficulty climbing stairs: {ACTIONS;DENIES/REPORTS:21021675::"Denies"} Difficulty getting out of chair: {ACTIONS;DENIES/REPORTS:21021675::"Denies"} Difficulty using hands for taps, buttons, cutlery, and/or writing: {ACTIONS;DENIES/REPORTS:21021675::"Denies"}   No Rheumatology ROS completed.   PMFS History:  Patient Active Problem List   Diagnosis Date Noted  . Status post total knee replacement, left 04/13/2017  . Vitamin D deficiency 04/12/2017  . History of chronic kidney disease 04/12/2017  . Osteopenia of multiple sites 04/06/2017  . Fibromyalgia syndrome 09/11/2016  . Other fatigue 09/11/2016  . Primary insomnia 09/11/2016  . History of osteopenia 09/11/2016  . OA (osteoarthritis) of knee 11/08/2015  . Essential hypertension, benign 12/16/2013  . Pure hypercholesterolemia 12/16/2013    Past Medical History:  Diagnosis Date  . Benign essential tremor   . Cancer [facility])    h/o skin cancer  . Chronic kidney disease   . Essential hypertension, benign   . Fibromyalgia   . GERD (gastroesophageal reflux disease)   . Heart murmur   . Hemorrhoids   . Hyponatremia    secondary to hctz-recurrent March 2010  . Low back pain    facet joint pain  . Occipital neuralgia   . Osteoarthritis   . Osteopenia   . Pure hypercholesterolemia 12/16/2013  . Shortness of breath dyspnea   . Tuberculosis    as a child    Family History  Problem Relation Age of Onset  . Heart disease Father   . Prostate cancer Father   . Heart disease Mother   . Cancer - Ovarian Mother   . Congestive Heart Failure Mother   . Stroke Sister    Past Surgical History:  Procedure Laterality Date  . TOTAL KNEE ARTHROPLASTY Left 11/08/2015   Procedure: LEFT TOTAL KNEE ARTHROPLASTY;  Surgeon: Gaynelle Arabian, MD;  Location: [facility];  Service: Orthopedics;  Laterality: Left;  . TUBAL LIGATION     Social History   Social History Narrative   Denies caffeine use      Objective: Vital Signs: There were no vitals taken for this visit.   Physical Exam   Musculoskeletal Exam: ***  CDAI Exam: No CDAI exam completed.    Investigation: No additional findings.   Imaging: No results found.  Speciality Comments: No specialty comments available.    Procedures:  No procedures performed Allergies: Hctz [hydrochlorothiazide]; Lisinopril; and Sulfa antibiotics   Assessment / Plan:     Visit Diagnoses: No diagnosis found.    Orders: No orders of the defined types were placed in this encounter.  No orders of the defined types were placed in this encounter.   Face-to-face time spent with patient was *** minutes. 50% of time was spent in counseling and coordination of care.  Follow-Up Instructions: No follow-ups on file.   Earnestine Mealing, CMA  Note - This record has been created using Editor, commissioning.  Chart creation errors have been sought, but  may not always  have been located. Such creation errors do not reflect on  the standard of medical care.

## 2017-10-12 ENCOUNTER — Ambulatory Visit: Payer: PPO | Admitting: Rheumatology

## 2017-11-28 DIAGNOSIS — E7800 Pure hypercholesterolemia, unspecified: Secondary | ICD-10-CM | POA: Diagnosis not present

## 2017-11-28 DIAGNOSIS — I129 Hypertensive chronic kidney disease with stage 1 through stage 4 chronic kidney disease, or unspecified chronic kidney disease: Secondary | ICD-10-CM | POA: Diagnosis not present

## 2017-11-28 DIAGNOSIS — N183 Chronic kidney disease, stage 3 (moderate): Secondary | ICD-10-CM | POA: Diagnosis not present

## 2017-11-28 DIAGNOSIS — M797 Fibromyalgia: Secondary | ICD-10-CM | POA: Diagnosis not present

## 2017-12-19 DIAGNOSIS — N907 Vulvar cyst: Secondary | ICD-10-CM | POA: Diagnosis not present

## 2017-12-20 ENCOUNTER — Telehealth: Payer: Self-pay | Admitting: Radiology

## 2017-12-20 NOTE — Telephone Encounter (Signed)
Called to discuss lyrica med sand pt stated she is continuing her treatment with PCP.

## 2018-01-02 ENCOUNTER — Telehealth: Payer: Self-pay | Admitting: Interventional Cardiology

## 2018-01-02 NOTE — Telephone Encounter (Signed)
New Message   Pt c/o Shortness Of Breath: STAT if SOB developed within the last 24 hours or pt is noticeably SOB on the phone  1. Are you currently SOB (can you hear that pt is SOB on the phone)? yes  2. How long have you been experiencing SOB? She says she had it for a while but its became worse  3. Are you SOB when sitting or when up moving around? both  4. Are you currently experiencing any other symptoms? A lot of swelling in legs and feet

## 2018-01-02 NOTE — Telephone Encounter (Signed)
Spoke with the patient who evidently was extremely SOB upon conversation.  She was last seen by Dr Irish Lack about 3 years ago, because she was to call if needed to be seen, and has not been on Furosemide in awhile.  I arranged for her to see Dr Irish Lack  6/27 (Thursday).  She denies CP or any other symptoms.

## 2018-01-03 ENCOUNTER — Encounter: Payer: Self-pay | Admitting: Interventional Cardiology

## 2018-01-03 ENCOUNTER — Encounter (INDEPENDENT_AMBULATORY_CARE_PROVIDER_SITE_OTHER): Payer: Self-pay

## 2018-01-03 ENCOUNTER — Ambulatory Visit: Payer: PPO | Admitting: Interventional Cardiology

## 2018-01-03 VITALS — BP 128/66 | HR 67 | Ht 65.0 in | Wt 164.0 lb

## 2018-01-03 DIAGNOSIS — R0602 Shortness of breath: Secondary | ICD-10-CM | POA: Diagnosis not present

## 2018-01-03 DIAGNOSIS — I10 Essential (primary) hypertension: Secondary | ICD-10-CM | POA: Diagnosis not present

## 2018-01-03 DIAGNOSIS — E782 Mixed hyperlipidemia: Secondary | ICD-10-CM

## 2018-01-03 NOTE — Patient Instructions (Signed)
Medication Instructions:  Your physician recommends that you continue on your current medications as directed. Please refer to the Current Medication list given to you today.   Labwork: None ordered  Testing/Procedures: Your physician has requested that you have an echocardiogram. Echocardiography is a painless test that uses sound waves to create images of your heart. It provides your doctor with information about the size and shape of your heart and how well your heart's chambers and valves are working. This procedure takes approximately one hour. There are no restrictions for this procedure.  Follow-Up: Your physician wants you to follow-up in: AS NEEDED   Any Other Special Instructions Will Be Listed Below (If Applicable).  Echocardiogram An echocardiogram, or echocardiography, uses sound waves (ultrasound) to produce an image of your heart. The echocardiogram is simple, painless, obtained within a short period of time, and offers valuable information to your health care provider. The images from an echocardiogram can provide information such as:  Evidence of coronary artery disease (CAD).  Heart size.  Heart muscle function.  Heart valve function.  Aneurysm detection.  Evidence of a past heart attack.  Fluid buildup around the heart.  Heart muscle thickening.  Assess heart valve function.  Tell a health care provider about:  Any allergies you have.  All medicines you are taking, including vitamins, herbs, eye drops, creams, and over-the-counter medicines.  Any problems you or family members have had with anesthetic medicines.  Any blood disorders you have.  Any surgeries you have had.  Any medical conditions you have.  Whether you are pregnant or may be pregnant. What happens before the procedure? No special preparation is needed. Eat and drink normally. What happens during the procedure?  In order to produce an image of your heart, gel will be applied to  your chest and a wand-like tool (transducer) will be moved over your chest. The gel will help transmit the sound waves from the transducer. The sound waves will harmlessly bounce off your heart to allow the heart images to be captured in real-time motion. These images will then be recorded.  You may need an IV to receive a medicine that improves the quality of the pictures. What happens after the procedure? You may return to your normal schedule including diet, activities, and medicines, unless your health care provider tells you otherwise. This information is not intended to replace advice given to you by your health care provider. Make sure you discuss any questions you have with your health care provider. Document Released: 06/23/2000 Document Revised: 02/12/2016 Document Reviewed: 03/03/2013 Elsevier Interactive Patient Education  2017 [employer].    If you need a refill on your cardiac medications before your next appointment, please call your pharmacy.

## 2018-01-03 NOTE — Progress Notes (Signed)
Cardiology Office Note   Date:  01/03/2018   ID:  [name], [name] Dec 20, 1940, MRN [id number]  PCP:  Kelton Pillar, MD    No chief complaint on file.  DOE  [facility] Readings from Last 3 Encounters:  01/03/18 164 lb (74.4 kg)  08/06/17 159 lb (72.1 kg)  04/13/17 154 lb (69.9 kg)       History of Present Illness: [name] is a 77 y.o. female  with HTN and HLD.  She has had fibromyalgia.  She was unable to tolerate HCTZ due to hyponatremia in the past. She switched her ACE-I to amlodipine and her sodium improved.   Of late, she has noticed more DOE and occasional resting SHOB.  She has noticed some mild swelling in her legs.  No chest pain. She gained weight over the week of her birthday when she went out of town.  She ate a lot of dessert there.    Denies : Chest pain. Dizziness. Nitroglycerin use. Orthopnea. Palpitations. Paroxysmal nocturnal dyspnea.  Syncope.   No problems breathing when she sleeps.  SHe takes Ambien.  She sleeps on one pillow.  She will also take benadryl before sleep sometimes.   Past Medical History:  Diagnosis Date  . Benign essential tremor   . Cancer [facility])    h/o skin cancer  . Chronic kidney disease   . Essential hypertension, benign   . Fibromyalgia   . GERD (gastroesophageal reflux disease)   . Heart murmur   . Hemorrhoids   . Hyponatremia    secondary to hctz-recurrent March 2010  . Low back pain    facet joint pain  . Occipital neuralgia   . Osteoarthritis   . Osteopenia   . Pure hypercholesterolemia 12/16/2013  . Shortness of breath dyspnea   . Tuberculosis    as a child    Past Surgical History:  Procedure Laterality Date  . TOTAL KNEE ARTHROPLASTY Left 11/08/2015   Procedure: LEFT TOTAL KNEE ARTHROPLASTY;  Surgeon: Gaynelle Arabian, MD;  Location: [facility];  Service: Orthopedics;  Laterality: Left;  . TUBAL LIGATION       Current Outpatient Medications  Medication Sig Dispense Refill  . amLODipine (NORVASC) 5 MG tablet  TAKE ONE TABLET BY MOUTH DAILY 90 tablet 2  . atenolol (TENORMIN) 25 MG tablet TAKE ONE TABLET BY MOUTH DAILY 90 tablet 1  . baclofen (LIORESAL) 10 MG tablet TAKE 1 TABLET BY MOUTH TWICE DAILY (Patient taking differently: TAKE 1 TABLET BY MOUTH TWICE DAILY AS NEEDED) 180 tablet 0  . diazepam (VALIUM) 10 MG tablet Take 5 mg by mouth every 8 (eight) hours as needed for anxiety.    . polyvinyl alcohol (LIQUIFILM TEARS) 1.4 % ophthalmic solution Place 1 drop into both eyes every 2 (two) hours as needed for dry eyes.    . pregabalin (LYRICA) 75 MG capsule Take 1 capsule (75 mg total) by mouth 3 (three) times daily. 270 capsule 2  . SALINE NA Place 1 spray into both nostrils every 2 (two) hours as needed (dryness).     . simvastatin (ZOCOR) 40 MG tablet Take 1 tablet (40 mg total) by mouth at bedtime. 90 tablet 2  . traMADol (ULTRAM) 50 MG tablet TAKE 2 TABLETS BY MOUTH 3 TIMES A DAY AS NEEDED 540 tablet 0  . zolpidem (AMBIEN CR) 12.5 MG CR tablet TAKE 1 TABLET BY MOUTH AT BEDTIME AS NEEDED FOR SLEEP 90 tablet 0   No current facility-administered medications for  this visit.     Allergies:   Hctz [hydrochlorothiazide]; Lisinopril; and Sulfa antibiotics    Social History:  The patient  reports that she has never smoked. She has never used smokeless tobacco. She reports that she drinks alcohol. She reports that she does not use drugs.   Family History:  The patient's family history includes Cancer - Ovarian in her mother; Congestive Heart Failure in her mother; Heart disease in her father and mother; Prostate cancer in her father; Stroke in her sister.    ROS:  Please see the history of present illness.   Otherwise, review of systems are positive for DOE.   All other systems are reviewed and negative.    PHYSICAL EXAM: VS:  BP 128/66   Pulse 67   Ht 5\' 5"  (1.651 m)   Wt 164 lb (74.4 kg)   SpO2 96%   BMI 27.29 kg/m  , BMI Body mass index is 27.29 kg/m. GEN: Well nourished, well developed, in  no acute distress  HEENT: normal  Neck: no JVD, carotid bruits, or masses Cardiac: RRR; no murmurs, rubs, or gallops,no edema  Respiratory:  clear to auscultation bilaterally, normal work of breathing GI: soft, nontender, nondistended, + BS MS: no deformity or atrophy  Skin: warm and dry, no rash Neuro:  Strength and sensation are intact Psych: euthymic mood, full affect   EKG:   The ekg ordered today demonstrates NSR, no ST changes   Recent Labs: No results found for requested labs within last 8760 hours.   Lipid Panel No results found for: CHOL, TRIG, HDL, CHOLHDL, VLDL, LDLCALC, LDLDIRECT   Other studies Reviewed: Additional studies/ records that were reviewed today with results demonstrating: 2011 echo showed normal EF.  Cr 0.83 in 5/19   ASSESSMENT AND PLAN:  1. DOE: Check echo.  No obvious evidence of volume overload.  Last echo 2011. 2.  HTN: The current medical regimen is effective;  continue present plan and medications. 3. Hyperlipidemia: DL 66 in 5/19.    Current medicines are reviewed at length with the patient today.  The patient concerns regarding her medicines were addressed.  The following changes have been made:  No change  Labs/ tests ordered today include:  No orders of the defined types were placed in this encounter.   Recommend 150 minutes/week of aerobic exercise Low fat, low carb, high fiber diet recommended  Disposition:   FU for echo   Signed, Larae Grooms, MD  01/03/2018 11:29 AM    [facility] [address], [place], [place]  [address] Phone: [phone]; Fax: [phone]

## 2018-01-04 DIAGNOSIS — M79674 Pain in right toe(s): Secondary | ICD-10-CM | POA: Diagnosis not present

## 2018-01-04 DIAGNOSIS — M79675 Pain in left toe(s): Secondary | ICD-10-CM | POA: Diagnosis not present

## 2018-01-04 DIAGNOSIS — M2041 Other hammer toe(s) (acquired), right foot: Secondary | ICD-10-CM | POA: Diagnosis not present

## 2018-01-04 DIAGNOSIS — M2042 Other hammer toe(s) (acquired), left foot: Secondary | ICD-10-CM | POA: Diagnosis not present

## 2018-01-08 ENCOUNTER — Other Ambulatory Visit: Payer: Self-pay

## 2018-01-08 ENCOUNTER — Ambulatory Visit (HOSPITAL_COMMUNITY): Payer: PPO | Attending: Cardiovascular Disease

## 2018-01-08 DIAGNOSIS — N189 Chronic kidney disease, unspecified: Secondary | ICD-10-CM | POA: Diagnosis not present

## 2018-01-08 DIAGNOSIS — R0602 Shortness of breath: Secondary | ICD-10-CM

## 2018-01-08 DIAGNOSIS — I129 Hypertensive chronic kidney disease with stage 1 through stage 4 chronic kidney disease, or unspecified chronic kidney disease: Secondary | ICD-10-CM | POA: Insufficient documentation

## 2018-01-08 DIAGNOSIS — I071 Rheumatic tricuspid insufficiency: Secondary | ICD-10-CM | POA: Diagnosis not present

## 2018-01-08 DIAGNOSIS — E785 Hyperlipidemia, unspecified: Secondary | ICD-10-CM | POA: Insufficient documentation

## 2018-03-13 DIAGNOSIS — N907 Vulvar cyst: Secondary | ICD-10-CM | POA: Diagnosis not present

## 2018-04-16 DIAGNOSIS — R197 Diarrhea, unspecified: Secondary | ICD-10-CM | POA: Diagnosis not present

## 2018-04-16 DIAGNOSIS — Z23 Encounter for immunization: Secondary | ICD-10-CM | POA: Diagnosis not present

## 2018-04-24 DIAGNOSIS — R197 Diarrhea, unspecified: Secondary | ICD-10-CM | POA: Diagnosis not present

## 2018-05-01 ENCOUNTER — Other Ambulatory Visit: Payer: Self-pay | Admitting: *Deleted

## 2018-05-01 MED ORDER — GABAPENTIN 100 MG PO CAPS: 100.0000 mg | ORAL_CAPSULE | Freq: Every day | ORAL | 0 refills | Status: DC

## 2018-05-01 NOTE — Telephone Encounter (Signed)
LMOM for patient to call and schedule follow-up appointment.   °

## 2018-05-01 NOTE — Telephone Encounter (Signed)
Please schedule patient for a follow up visit. Patient was due July 2019. Thanks!

## 2018-05-01 NOTE — Telephone Encounter (Signed)
Refill request received via fax fr Gabapentin. We have not filled this prescription since  April 2018.  Last visit: 08/06/17 Next Visit was due in July 2019. Message sent to the front to schedule patient.  Okay to refill Gabapentin?

## 2018-05-01 NOTE — Telephone Encounter (Signed)
Okay to refill gabapentin

## 2018-09-03 DIAGNOSIS — G250 Essential tremor: Secondary | ICD-10-CM | POA: Diagnosis not present

## 2018-09-03 DIAGNOSIS — J309 Allergic rhinitis, unspecified: Secondary | ICD-10-CM | POA: Diagnosis not present

## 2018-09-03 DIAGNOSIS — Z0000 Encounter for general adult medical examination without abnormal findings: Secondary | ICD-10-CM | POA: Diagnosis not present

## 2018-09-03 DIAGNOSIS — I129 Hypertensive chronic kidney disease with stage 1 through stage 4 chronic kidney disease, or unspecified chronic kidney disease: Secondary | ICD-10-CM | POA: Diagnosis not present

## 2018-09-03 DIAGNOSIS — M797 Fibromyalgia: Secondary | ICD-10-CM | POA: Diagnosis not present

## 2018-09-03 DIAGNOSIS — E7800 Pure hypercholesterolemia, unspecified: Secondary | ICD-10-CM | POA: Diagnosis not present

## 2018-09-03 DIAGNOSIS — N183 Chronic kidney disease, stage 3 (moderate): Secondary | ICD-10-CM | POA: Diagnosis not present

## 2018-09-03 DIAGNOSIS — M1990 Unspecified osteoarthritis, unspecified site: Secondary | ICD-10-CM | POA: Diagnosis not present

## 2018-09-03 DIAGNOSIS — M2040 Other hammer toe(s) (acquired), unspecified foot: Secondary | ICD-10-CM | POA: Diagnosis not present

## 2018-10-29 DIAGNOSIS — H43812 Vitreous degeneration, left eye: Secondary | ICD-10-CM | POA: Diagnosis not present

## 2018-12-18 DIAGNOSIS — M1990 Unspecified osteoarthritis, unspecified site: Secondary | ICD-10-CM | POA: Diagnosis not present

## 2018-12-18 DIAGNOSIS — M797 Fibromyalgia: Secondary | ICD-10-CM | POA: Diagnosis not present

## 2018-12-18 DIAGNOSIS — E8770 Fluid overload, unspecified: Secondary | ICD-10-CM | POA: Diagnosis not present

## 2019-04-12 DIAGNOSIS — Z23 Encounter for immunization: Secondary | ICD-10-CM | POA: Diagnosis not present

## 2019-06-04 DIAGNOSIS — H02054 Trichiasis without entropian left upper eyelid: Secondary | ICD-10-CM | POA: Diagnosis not present

## 2019-06-04 DIAGNOSIS — H02051 Trichiasis without entropian right upper eyelid: Secondary | ICD-10-CM | POA: Diagnosis not present

## 2019-06-04 DIAGNOSIS — H0015 Chalazion left lower eyelid: Secondary | ICD-10-CM | POA: Diagnosis not present

## 2019-08-01 ENCOUNTER — Ambulatory Visit: Payer: PPO | Attending: Internal Medicine

## 2019-09-03 DIAGNOSIS — H02051 Trichiasis without entropian right upper eyelid: Secondary | ICD-10-CM | POA: Diagnosis not present

## 2019-09-03 DIAGNOSIS — H02054 Trichiasis without entropian left upper eyelid: Secondary | ICD-10-CM | POA: Diagnosis not present

## 2019-09-08 DIAGNOSIS — M797 Fibromyalgia: Secondary | ICD-10-CM | POA: Diagnosis not present

## 2019-09-08 DIAGNOSIS — Z0000 Encounter for general adult medical examination without abnormal findings: Secondary | ICD-10-CM | POA: Diagnosis not present

## 2019-09-08 DIAGNOSIS — J309 Allergic rhinitis, unspecified: Secondary | ICD-10-CM | POA: Diagnosis not present

## 2019-09-08 DIAGNOSIS — M1990 Unspecified osteoarthritis, unspecified site: Secondary | ICD-10-CM | POA: Diagnosis not present

## 2019-09-08 DIAGNOSIS — G250 Essential tremor: Secondary | ICD-10-CM | POA: Diagnosis not present

## 2019-09-08 DIAGNOSIS — M859 Disorder of bone density and structure, unspecified: Secondary | ICD-10-CM | POA: Diagnosis not present

## 2019-09-08 DIAGNOSIS — N183 Chronic kidney disease, stage 3 unspecified: Secondary | ICD-10-CM | POA: Diagnosis not present

## 2019-09-08 DIAGNOSIS — E7800 Pure hypercholesterolemia, unspecified: Secondary | ICD-10-CM | POA: Diagnosis not present

## 2019-09-08 DIAGNOSIS — E559 Vitamin D deficiency, unspecified: Secondary | ICD-10-CM | POA: Diagnosis not present

## 2019-09-08 DIAGNOSIS — I129 Hypertensive chronic kidney disease with stage 1 through stage 4 chronic kidney disease, or unspecified chronic kidney disease: Secondary | ICD-10-CM | POA: Diagnosis not present

## 2019-09-08 DIAGNOSIS — Z1389 Encounter for screening for other disorder: Secondary | ICD-10-CM | POA: Diagnosis not present

## 2019-09-08 DIAGNOSIS — E871 Hypo-osmolality and hyponatremia: Secondary | ICD-10-CM | POA: Diagnosis not present

## 2019-09-24 ENCOUNTER — Other Ambulatory Visit: Payer: Self-pay

## 2019-09-24 ENCOUNTER — Encounter: Payer: Self-pay | Admitting: Dermatology

## 2019-09-24 ENCOUNTER — Ambulatory Visit: Payer: PPO | Admitting: Dermatology

## 2019-09-24 DIAGNOSIS — L821 Other seborrheic keratosis: Secondary | ICD-10-CM

## 2019-09-24 DIAGNOSIS — Z1283 Encounter for screening for malignant neoplasm of skin: Secondary | ICD-10-CM | POA: Diagnosis not present

## 2019-09-24 DIAGNOSIS — Z85828 Personal history of other malignant neoplasm of skin: Secondary | ICD-10-CM | POA: Diagnosis not present

## 2019-09-24 DIAGNOSIS — D485 Neoplasm of uncertain behavior of skin: Secondary | ICD-10-CM

## 2019-09-24 DIAGNOSIS — D492 Neoplasm of unspecified behavior of bone, soft tissue, and skin: Secondary | ICD-10-CM

## 2019-09-25 NOTE — Progress Notes (Signed)
   Follow-Up Visit   Subjective  [name] is a 79 y.o. female who presents for the following: Skin Problem (Patient here today to have spots check around her collar, chest and upper waist x years.  Patient denies bleeding.  Patient declines a gown).  growth Location: right flank Duration: 6 months Quality: has grown Associated Signs/Symptoms:itch Modifying Factors:  Severity:  Timing: Context: worries patient  The following portions of the chart were reviewed this encounter and updated as appropriate:     Objective  Well appearing patient in no apparent distress; mood and affect are within normal limits.  A focused examination was performed including waist up full examination of skin. Relevant physical exam findings are noted in the Assessment and Plan.  Objective  Right Lower Back: Enlargement of crust right flank, constantly rubs on clothing. Patient requests biopsy. No measurement per ST       Objective  Chest - Medial [facility]), Left Upper Back, Neck - Anterior (2), Right Upper Back: Stuck-on, waxy, tan-brown papules and plaques. --Discussed benign etiology and prognosis. Patient strongly desires therapy; will try freezing. Risks of failure/scar detailed.  Objective  Lips: History of skin cancer above lip; feelable scar, no residual.   Assessment & Plan  Neoplasm of skin Right Lower Back  Skin / nail biopsy Type of biopsy: tangential   Anesthesia: the lesion was anesthetized in a standard fashion   Anesthetic:  1% lidocaine w/ epinephrine 1-100,000 local infiltration Instrument used: flexible razor blade   Hemostasis achieved with: ferric subsulfate   Outcome: patient tolerated procedure well   Post-procedure details: sterile dressing applied and wound care instructions given   Dressing type: petrolatum   Additional details:  Patient identified lesion of concern.  Lesion identified by physician.  Specimen 1 - Surgical pathology Differential  Diagnosis: R/O AK vs SK Check Margins: No cautery  Seborrheic keratosis (5) Neck - Anterior (2); Chest - Medial [facility]); Left Upper Back; Right Upper Back  Skin exam for malignant neoplasm Lips  observe

## 2019-10-07 DIAGNOSIS — H903 Sensorineural hearing loss, bilateral: Secondary | ICD-10-CM | POA: Diagnosis not present

## 2019-10-27 DIAGNOSIS — I129 Hypertensive chronic kidney disease with stage 1 through stage 4 chronic kidney disease, or unspecified chronic kidney disease: Secondary | ICD-10-CM | POA: Diagnosis not present

## 2019-10-27 DIAGNOSIS — N183 Chronic kidney disease, stage 3 unspecified: Secondary | ICD-10-CM | POA: Diagnosis not present

## 2019-10-27 DIAGNOSIS — R609 Edema, unspecified: Secondary | ICD-10-CM | POA: Diagnosis not present

## 2019-11-14 DIAGNOSIS — S60459A Superficial foreign body of unspecified finger, initial encounter: Secondary | ICD-10-CM | POA: Diagnosis not present

## 2019-11-24 ENCOUNTER — Other Ambulatory Visit: Payer: Self-pay

## 2019-11-24 ENCOUNTER — Ambulatory Visit: Payer: PPO | Admitting: Dermatology

## 2019-11-24 DIAGNOSIS — M79672 Pain in left foot: Secondary | ICD-10-CM | POA: Diagnosis not present

## 2019-11-24 DIAGNOSIS — L570 Actinic keratosis: Secondary | ICD-10-CM

## 2019-11-24 DIAGNOSIS — M7741 Metatarsalgia, right foot: Secondary | ICD-10-CM | POA: Diagnosis not present

## 2019-11-24 DIAGNOSIS — M2041 Other hammer toe(s) (acquired), right foot: Secondary | ICD-10-CM | POA: Diagnosis not present

## 2019-11-24 DIAGNOSIS — D229 Melanocytic nevi, unspecified: Secondary | ICD-10-CM

## 2019-11-24 DIAGNOSIS — L820 Inflamed seborrheic keratosis: Secondary | ICD-10-CM | POA: Diagnosis not present

## 2019-11-24 DIAGNOSIS — Z85828 Personal history of other malignant neoplasm of skin: Secondary | ICD-10-CM

## 2019-11-24 DIAGNOSIS — D225 Melanocytic nevi of trunk: Secondary | ICD-10-CM

## 2019-11-24 DIAGNOSIS — M79671 Pain in right foot: Secondary | ICD-10-CM | POA: Diagnosis not present

## 2019-11-28 ENCOUNTER — Other Ambulatory Visit: Payer: Self-pay | Admitting: Family Medicine

## 2019-11-28 ENCOUNTER — Encounter: Payer: Self-pay | Admitting: Dermatology

## 2019-11-28 DIAGNOSIS — Z1231 Encounter for screening mammogram for malignant neoplasm of breast: Secondary | ICD-10-CM

## 2019-11-28 NOTE — Progress Notes (Signed)
   Follow-Up Visit   Subjective  [name] is a 79 y.o. female who presents for the following: Follow-up (On spots that were frozen at last visit. Face and around neck. patient does not think they are better. ).  Crusts Location: Mostly torso Duration: This Quality: Persist Associated Signs/Symptoms: Itch, sometimes sore Modifying Factors: Previous freezing did not help much Severity:  Timing: Context: History of multiple non-mole skin cancers  The following portions of the chart were reviewed this encounter and updated as appropriate:     Objective  Well appearing patient in no apparent distress; mood and affect are within normal limits.  All skin waist up examined.   Assessment & Plan  AK (actinic keratosis) (5) Chest - Medial [facility]); Left Breast; Left Temple; Left Zygomatic Area; Left Forehead  Destruction of lesion - Chest - Medial (Center), Left Breast, Left Forehead, Left Temple, Left Zygomatic Area  Destruction method: cryotherapy   Informed consent: discussed and consent obtained   Timeout:  patient name, date of birth, surgical site, and procedure verified Lesion destroyed using liquid nitrogen: Yes   Region frozen until ice ball extended beyond lesion: Yes   Cryotherapy cycles:  5 Outcome: patient tolerated procedure well with no complications    Inflamed seborrheic keratosis (2) Left Lower Back; Right Lower Back  LN2 freeze at patient's request  Nevus Left Upper Back

## 2019-12-12 ENCOUNTER — Ambulatory Visit
Admission: RE | Admit: 2019-12-12 | Discharge: 2019-12-12 | Disposition: A | Discharge status: Home / Self Care | Payer: PPO | Source: Ambulatory Visit | Attending: Family Medicine | Admitting: Family Medicine

## 2019-12-12 ENCOUNTER — Other Ambulatory Visit: Payer: Self-pay

## 2019-12-12 DIAGNOSIS — Z1231 Encounter for screening mammogram for malignant neoplasm of breast: Secondary | ICD-10-CM

## 2019-12-16 DIAGNOSIS — M7741 Metatarsalgia, right foot: Secondary | ICD-10-CM | POA: Diagnosis not present

## 2020-01-15 ENCOUNTER — Telehealth: Payer: Self-pay | Admitting: Neurology

## 2020-01-15 NOTE — Telephone Encounter (Signed)
Pt is a returning pt of Dr Tori Milks. She is being referred back to our office for Tremors and is requesting to see Dr. Brett Fairy. The pt says that she is also experiencing sleep problems but the referral is for tremors.  Please advise if the switch can be made?  Thank you

## 2020-01-15 NOTE — Telephone Encounter (Signed)
I had seen her once over 5 years ago.  Okay to switch from my end of things.

## 2020-01-26 ENCOUNTER — Ambulatory Visit: Payer: PPO | Admitting: Dermatology

## 2020-02-03 NOTE — Telephone Encounter (Signed)
Not an active patient over the last 3 years- OK to switch. Please advise that Dr Rexene Alberts is the movement specialist in our office,in case the patient was thinking I am one, too. CD .

## 2020-02-08 DIAGNOSIS — J029 Acute pharyngitis, unspecified: Secondary | ICD-10-CM | POA: Diagnosis not present

## 2020-02-08 DIAGNOSIS — Z03818 Encounter for observation for suspected exposure to other biological agents ruled out: Secondary | ICD-10-CM | POA: Diagnosis not present

## 2020-03-17 DIAGNOSIS — H2513 Age-related nuclear cataract, bilateral: Secondary | ICD-10-CM | POA: Diagnosis not present

## 2020-03-17 DIAGNOSIS — H52203 Unspecified astigmatism, bilateral: Secondary | ICD-10-CM | POA: Diagnosis not present

## 2020-04-01 ENCOUNTER — Institutional Professional Consult (permissible substitution): Payer: PPO | Admitting: Neurology

## 2020-04-01 ENCOUNTER — Telehealth: Payer: Self-pay | Admitting: Neurology

## 2020-04-01 NOTE — Telephone Encounter (Signed)
Pt was a no show to new pt apt with Dr Brett Fairy

## 2020-04-15 DIAGNOSIS — H2512 Age-related nuclear cataract, left eye: Secondary | ICD-10-CM | POA: Diagnosis not present

## 2020-04-15 DIAGNOSIS — H25812 Combined forms of age-related cataract, left eye: Secondary | ICD-10-CM | POA: Diagnosis not present

## 2020-05-03 DIAGNOSIS — J22 Unspecified acute lower respiratory infection: Secondary | ICD-10-CM | POA: Diagnosis not present

## 2020-05-03 DIAGNOSIS — Z03818 Encounter for observation for suspected exposure to other biological agents ruled out: Secondary | ICD-10-CM | POA: Diagnosis not present

## 2020-05-03 DIAGNOSIS — J209 Acute bronchitis, unspecified: Secondary | ICD-10-CM | POA: Diagnosis not present

## 2020-05-05 DIAGNOSIS — J0100 Acute maxillary sinusitis, unspecified: Secondary | ICD-10-CM | POA: Diagnosis not present

## 2020-05-05 DIAGNOSIS — R059 Cough, unspecified: Secondary | ICD-10-CM | POA: Diagnosis not present

## 2020-05-10 DIAGNOSIS — G479 Sleep disorder, unspecified: Secondary | ICD-10-CM | POA: Diagnosis not present

## 2020-05-10 DIAGNOSIS — J0110 Acute frontal sinusitis, unspecified: Secondary | ICD-10-CM | POA: Diagnosis not present

## 2020-05-24 DIAGNOSIS — H02051 Trichiasis without entropian right upper eyelid: Secondary | ICD-10-CM | POA: Diagnosis not present

## 2020-05-24 DIAGNOSIS — H02054 Trichiasis without entropian left upper eyelid: Secondary | ICD-10-CM | POA: Diagnosis not present

## 2020-06-16 ENCOUNTER — Ambulatory Visit: Payer: PPO | Admitting: Physician Assistant

## 2020-06-21 ENCOUNTER — Encounter: Payer: Self-pay | Admitting: Neurology

## 2020-06-21 ENCOUNTER — Ambulatory Visit: Payer: PPO | Admitting: Neurology

## 2020-06-21 VITALS — BP 128/72 | HR 69 | Ht 65.0 in | Wt 155.0 lb

## 2020-06-21 DIAGNOSIS — R251 Tremor, unspecified: Secondary | ICD-10-CM

## 2020-06-21 DIAGNOSIS — F5101 Primary insomnia: Secondary | ICD-10-CM | POA: Diagnosis not present

## 2020-06-21 DIAGNOSIS — R490 Dysphonia: Secondary | ICD-10-CM

## 2020-06-21 DIAGNOSIS — G250 Essential tremor: Secondary | ICD-10-CM

## 2020-06-21 DIAGNOSIS — F5104 Psychophysiologic insomnia: Secondary | ICD-10-CM | POA: Diagnosis not present

## 2020-06-21 DIAGNOSIS — Z87898 Personal history of other specified conditions: Secondary | ICD-10-CM

## 2020-06-21 MED ORDER — TRAZODONE HCL 50 MG PO TABS: 50.0000 mg | ORAL_TABLET | Freq: Every day | ORAL | 3 refills | Status: DC

## 2020-06-21 MED ORDER — PRIMIDONE 50 MG PO TABS: ORAL_TABLET | ORAL | 1 refills | Status: DC

## 2020-06-21 NOTE — Patient Instructions (Signed)
Mysoline-primidone is prescribed usually in tablet form and I invite you to use 1/2 tablet at bedtime for the next 30 days, this may completely eliminate the need for Ambien which is my hope.  Mysoline can be taken with the trazodone-did not do well which have also prescribed and I like you here to also just go with 1/2 tablet which would be 25 mg instead of a full dose of 50 mg.  Trazodone is not insomnia medication more than actually a potent antidepressant.  Mysoline is a precursor to phenobarbital and does treat tremor very effectively if essential in origin.  I like for you to follow-up with me in 2 months.  I will prescribe Ambien at a 5 mg tablet size because I want you to feel that you have it available for psychological reasons.   Insomnia Insomnia is a sleep disorder that makes it difficult to fall asleep or stay asleep. Insomnia can cause fatigue, low energy, difficulty concentrating, mood swings, and poor performance at work or school. There are three different ways to classify insomnia:  Difficulty falling asleep.  Difficulty staying asleep.  Waking up too early in the morning. Any type of insomnia can be long-term (chronic) or short-term (acute). Both are common. Short-term insomnia usually lasts for three months or less. Chronic insomnia occurs at least three times a week for longer than three months. What are the causes? Insomnia may be caused by another condition, situation, or substance, such as:  Anxiety.  Certain medicines.  Gastroesophageal reflux disease (GERD) or other gastrointestinal conditions.  Asthma or other breathing conditions.  Restless legs syndrome, sleep apnea, or other sleep disorders.  Chronic pain.  Menopause.  Stroke.  Abuse of alcohol, tobacco, or illegal drugs.  Mental health conditions, such as depression.  Caffeine.  Neurological disorders, such as Alzheimer's disease.  An overactive thyroid (hyperthyroidism). Sometimes, the cause  of insomnia may not be known. What increases the risk? Risk factors for insomnia include:  Gender. Women are affected more often than men.  Age. Insomnia is more common as you get older.  Stress.  Lack of exercise.  Irregular work schedule or working night shifts.  Traveling between different time zones.  Certain medical and mental health conditions. What are the signs or symptoms? If you have insomnia, the main symptom is having trouble falling asleep or having trouble staying asleep. This may lead to other symptoms, such as:  Feeling fatigued or having low energy.  Feeling nervous about going to sleep.  Not feeling rested in the morning.  Having trouble concentrating.  Feeling irritable, anxious, or depressed. How is this diagnosed? This condition may be diagnosed based on:  Your symptoms and medical history. Your health care provider may ask about: ? Your sleep habits. ? Any medical conditions you have. ? Your mental health.  A physical exam. How is this treated? Treatment for insomnia depends on the cause. Treatment may focus on treating an underlying condition that is causing insomnia. Treatment may also include:  Medicines to help you sleep.  Counseling or therapy.  Lifestyle adjustments to help you sleep better. Follow these instructions at home: Eating and drinking   Limit or avoid alcohol, caffeinated beverages, and cigarettes, especially close to bedtime. These can disrupt your sleep.  Do not eat a large meal or eat spicy foods right before bedtime. This can lead to digestive discomfort that can make it hard for you to sleep. Sleep habits   Keep a sleep diary to help you and  your health care provider figure out what could be causing your insomnia. Write down: ? When you sleep. ? When you wake up during the night. ? How well you sleep. ? How rested you feel the next day. ? Any side effects of medicines you are taking. ? What you eat and  drink.  Make your bedroom a dark, comfortable place where it is easy to fall asleep. ? Put up shades or blackout curtains to block light from outside. ? Use a white noise machine to block noise. ? Keep the temperature cool.  Limit screen use before bedtime. This includes: ? Watching TV. ? Using your smartphone, tablet, or computer.  Stick to a routine that includes going to bed and waking up at the same times every day and night. This can help you fall asleep faster. Consider making a quiet activity, such as reading, part of your nighttime routine.  Try to avoid taking naps during the day so that you sleep better at night.  Get out of bed if you are still awake after 15 minutes of trying to sleep. Keep the lights down, but try reading or doing a quiet activity. When you feel sleepy, go back to bed. General instructions  Take over-the-counter and prescription medicines only as told by your health care provider.  Exercise regularly, as told by your health care provider. Avoid exercise starting several hours before bedtime.  Use relaxation techniques to manage stress. Ask your health care provider to suggest some techniques that may work well for you. These may include: ? Breathing exercises. ? Routines to release muscle tension. ? Visualizing peaceful scenes.  Make sure that you drive carefully. Avoid driving if you feel very sleepy.  Keep all follow-up visits as told by your health care provider. This is important. Contact a health care provider if:  You are tired throughout the day.  You have trouble in your daily routine due to sleepiness.  You continue to have sleep problems, or your sleep problems get worse. Get help right away if:  You have serious thoughts about hurting yourself or someone else. If you ever feel like you may hurt yourself or others, or have thoughts about taking your own life, get help right away. You can go to your nearest emergency department or  call:  Your local emergency services (911 in the [place]  A suicide crisis helpline, such as the [place] at 8042738167. This is open 24 hours a day. Summary  Insomnia is a sleep disorder that makes it difficult to fall asleep or stay asleep.  Insomnia can be long-term (chronic) or short-term (acute).  Treatment for insomnia depends on the cause. Treatment may focus on treating an underlying condition that is causing insomnia.  Keep a sleep diary to help you and your health care provider figure out what could be causing your insomnia. This information is not intended to replace advice given to you by your health care provider. Make sure you discuss any questions you have with your health care provider. Document Revised: 06/08/2017 Document Reviewed: 04/05/2017 Elsevier Patient Education  [place].  Tremor A tremor is trembling or shaking that you cannot control. Most tremors affect the hands or arms. Tremors can also affect the head, vocal cords, face, and other parts of the body. There are many types of tremors. Common types include:  Essential tremor. These usually occur in people older than 40. It may run in families and can happen in otherwise healthy people.  Resting tremor. These occur when the muscles are at rest, such as when your hands are resting in your lap. People with Parkinson's disease often have resting tremors.  Postural tremor. These occur when you try to hold a pose, such as keeping your hands outstretched.  Kinetic tremor. These occur during purposeful movement, such as trying to touch a finger to your nose.  Task-specific tremor. These may occur when you perform certain tasks such as writing, speaking, or standing.  Psychogenic tremor. These dramatically lessen or disappear when you are distracted. They can happen in people of all ages. Some types of tremors have no known cause. Tremors can also be a symptom of nervous  system problems (neurological disorders) that may occur with aging. Some tremors go away with treatment, while others do not. Follow these instructions at home: Lifestyle      Limit alcohol intake to no more than 1 drink a day for nonpregnant women and 2 drinks a day for men. One drink equals 12 oz of beer, 5 oz of wine, or 1 oz of hard liquor.  Do not use any products that contain nicotine or tobacco, such as cigarettes and e-cigarettes. If you need help quitting, ask your health care provider.  Avoid extreme heat and extreme cold.  Limit your caffeine intake, as told by your health care provider.  Try to get 8 hours of sleep each night.  Find ways to manage your stress, such as meditation or yoga. General instructions  Take over-the-counter and prescription medicines only as told by your health care provider.  Keep all follow-up visits as told by your health care provider. This is important. Contact a health care provider if you:  Develop a tremor after starting a new medicine.  Have a tremor along with other symptoms such as: ? Numbness. ? Tingling. ? Pain. ? Weakness.  Notice that your tremor gets worse.  Notice that your tremor interferes with your day-to-day life. Summary  A tremor is trembling or shaking that you cannot control.  Most tremors affect the hands or arms.  Some types of tremors have no known cause. Others may be a symptom of nervous system problems (neurological disorders).  Make sure you discuss any tremors you have with your health care provider. This information is not intended to replace advice given to you by your health care provider. Make sure you discuss any questions you have with your health care provider. Document Revised: 06/08/2017 Document Reviewed: 04/26/2017 Elsevier Patient Education  2020 [employer].

## 2020-06-21 NOTE — Progress Notes (Signed)
SLEEP MEDICINE CLINIC    Provider:  Larey Seat, MD  Primary Care Physician:  Kelton Pillar, MD [place] [address] [place] [address]     Referring Provider: Kelton Pillar, Md [address] [place],  [place] [id number]          Chief Complaint according to patient   Patient presents with:    . New Patient (Initial Visit)         Chief Complaint  Patient presents with  . New Patient (Initial Visit)    Pt alone, rm 10. Presents today as new patient for Dr Brett Fairy, transfer of care. Was a pt of Dr Erling Cruz and he treated her for essential tremor.      [name] is a 79 y.o. female and seen here as a referral from Dr. Laurann Montana for re-evaluation of TREMOR. She has severe insomnia for 40 years, is using sleep aids for 25 years- and wants to be able to sleep without it. She also mentioned fibromyalgia, which we don't address in this neurological office.     HISTORY OF PRESENT ILLNESS:  [name] is a 79 - year -old White female patient seen here upon a referral on 06/21/2020 from dr Laurann Montana.  Chief concern according to patient :  [name] is a 79 y.o. female and seen here as a referral from Dr. Laurann Montana for re-evaluation of TREMOR. She has severe insomnia for 40 years, is using sleep aids for 25 years- and wants to be able to sleep without it. She also mentioned fibromyalgia, which we don't address in this neurological office.    I have the pleasure of seeing [name] on 06-21-2020, a right -handed White or Caucasian female with a chronic insonia disorder.  She   has a past medical history of BCC (basal cell carcinoma of skin) (03/27/2006), Benign essential tremor, Chronic kidney disease, Essential hypertension, benign, Fibromyalgia, GERD (gastroesophageal reflux disease), Heart murmur, Hemorrhoids, Hyponatremia, Low back pain, Occipital neuralgia, Osteoarthritis, Osteopenia, Pure hypercholesterolemia (12/16/2013), SCCA (squamous cell  carcinoma) of skin (07/15/2002), SCCA (squamous cell carcinoma) of skin (05/06/2004), SCCA (squamous cell carcinoma) of skin (01/09/2011), SCCA (squamous cell carcinoma) of skin (01/05/2014), SCCA (squamous cell carcinoma) of skin (09/30/2014), Shortness of breath dyspnea, Superficial basal cell carcinoma (BCC) (08/17/1982), and Tuberculosis.    [name] reports that her tremors have become a part of the overall discomfort in social situations, this is her first visit with me and she has previously seen Dr. Jeneen Rinks love and about 5 years ago she had seen Dr. Lora Paula.  His last visit with Dr. Clarene Reamer was in December 2013 and his first visit was around 2006 when she initially presented with a trigeminal neuralgia pain.  She had normal brain MRIs and MRAs of that year she was tried on [place] in 2009 by Dr. Erling Cruz after he suspected that she had fibromyalgia.  She has had insomnia for well over a decade and she had chronic hyponatremia which had been also mentioned by Dr. Erling Cruz.  Over the last decade she has noticed more more difficulties with handwriting and hand tremors and sometimes she feels uneasy in the company of others especially when eating with others.  Most recently she was at in the process of renewing her driver's license and the clerk asked her if she if she was cognitively in any way impaired and obviously just is from her signature and not from her from any  kind of other exchange of information or any discussion.  She has noted that her essential tremor is more severe than the one that she had witnessed in her mother and she may also have had this essential tremor much earlier than her mother manifested.  These tremors have just gradually become worse she feels there is more impairment in her right dominant side, also tremors are seen on both sides.  She also mentioned her chronic insomnia she is a non-smoker she does not drink caffeine she may drink 1 alcoholic beverage a day, but she has become  dependent on Ambien extended release.  She has had 1 sibling- a sister- who was not affected by tremor.      The patient never had a  sleep study. She never used Trazodone for insomnia, She never used Mysoline for her tremor. She is concerned about Parkinson's disease.       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  lives in a household alone.lives in a small private house with her cat.   Family status is divorced, 2 children, 1 living daughter. Lost son at age 78.   Tobacco use- none .  ETOH use 1 a day.  Caffeine intake is minimal.  Regular exercise in form of walking.        Sleep habits are as follows: The patient's dinner time is between 7 PM. The patient goes to bed at 10.30 PM and continues to sleep for 6-7  hours, has Krause bathroom breaks,.   The preferred sleep position is right sided , with the support of 2 pillows.  Dreams are reportedly rare and only occur in morning hours, after returning to sleep after feeding the cat. 7 AM is the usual rise time. The patient wakes up spontaneously  She reports  feeling refreshed / restored in AM, with symptoms such as dry mouth , Krause headaches or neck pain in AM , and residual fatigue.  Naps are taken infrequently.   Review of Systems: Out of a complete 14 system review, the patient complains of only the following symptoms, and all other reviewed systems are negative.:  Fatigue, sleepiness , snoring, fragmented sleep, Insomnia is chronic-   Krause restless legs, but some myoclonic jerk.  dysphonia , essentail tremor .    How likely are you to doze in the following situations: 0 = not likely, 1 = slight chance, 2 = moderate chance, 3 = high chance   Sitting and Reading? Watching Television? Sitting inactive in a public place (theater or meeting)? As a passenger in a car for an hour without a break? Lying down in the afternoon when circumstances permit? Sitting and  talking to someone? Sitting quietly after lunch without alcohol? In a car, while stopped for a few minutes in traffic?   Total = 9/ 24 points   FSS endorsed at 30/ 63 points.   Social History   Socioeconomic History  . Marital status: Divorced    Spouse name: Not on file  . Number of children: 2  . Years of education: Masters  . Highest education level: Not on file  Occupational History  . Occupation: Retired  Tobacco Use  . Smoking status: Never Smoker  . Smokeless tobacco: Never Used  Vaping Use  . Vaping Use: Never used  Substance and Sexual Activity  . Alcohol use: Yes    Alcohol/week: 0.0 standard drinks  . Drug use: Krause  .  Sexual activity: Not on file  Other Topics Concern  . Not on file  Social History Narrative   Denies caffeine use    Social Determinants of Health   Financial Resource Strain: Not on file  Food Insecurity: Not on file  Transportation Needs: Not on file  Physical Activity: Not on file  Stress: Not on file  Social Connections: Not on file    Family History  Problem Relation Age of Onset  . Heart disease Father   . Prostate cancer Father   . Heart disease Mother   . Cancer - Ovarian Mother   . Congestive Heart Failure Mother   . Stroke Sister     Past Medical History:  Diagnosis Date  . BCC (basal cell carcinoma of skin) 03/27/2006   Above Left Upper Lip (MOH's)  . Benign essential tremor   . Chronic kidney disease   . Essential hypertension, benign   . Fibromyalgia   . GERD (gastroesophageal reflux disease)   . Heart murmur   . Hemorrhoids   . Hyponatremia    secondary to hctz-recurrent March 2010  . Low back pain    facet joint pain  . Occipital neuralgia   . Osteoarthritis   . Osteopenia   . Pure hypercholesterolemia 12/16/2013  . SCCA (squamous cell carcinoma) of skin 07/15/2002   Left Inner Cheek(in situ)  . SCCA (squamous cell carcinoma) of skin 05/06/2004   Right Wing Nose(in situ) -Cx3,5FU  . SCCA (squamous cell  carcinoma) of skin 01/09/2011   Above Right Upper Lip(in situ)-Cx3  . SCCA (squamous cell carcinoma) of skin 01/05/2014   Left Temple(in situ)-Cx3,5FU  . SCCA (squamous cell carcinoma) of skin 09/30/2014   Right Wrist(in situ)-tx p bx  . Shortness of breath dyspnea   . Superficial basal cell carcinoma (BCC) 08/17/1982   Right Side Upper Lip  . Tuberculosis    as a child    Past Surgical History:  Procedure Laterality Date  . TOTAL KNEE ARTHROPLASTY Left 11/08/2015   Procedure: LEFT TOTAL KNEE ARTHROPLASTY;  Surgeon: Gaynelle Arabian, MD;  Location: [facility];  Service: Orthopedics;  Laterality: Left;  . TUBAL LIGATION       Current Outpatient Medications on File Prior to Visit  Medication Sig Dispense Refill  . amLODipine (NORVASC) 5 MG tablet TAKE ONE TABLET BY MOUTH DAILY 90 tablet 2  . atenolol (TENORMIN) 25 MG tablet TAKE ONE TABLET BY MOUTH DAILY 90 tablet 1  . diazepam (VALIUM) 10 MG tablet Take 1.25 mg by mouth daily as needed for anxiety.    . gabapentin (NEURONTIN) 100 MG capsule Take 100 mg by mouth 2 (two) times daily.    . pregabalin (LYRICA) 75 MG capsule Take 75 mg by mouth 2 (two) times daily.    Marland Kitchen SALINE NA Place 1 spray into both nostrils every 2 (two) hours as needed (dryness).     . simvastatin (ZOCOR) 40 MG tablet Take 1 tablet (40 mg total) by mouth at bedtime. 90 tablet 2  . Soft Lens Products (B & L SENSITIVE EYES SALINE) 0.4 % SOLN by Does not apply route.    . traMADol (ULTRAM) 50 MG tablet TAKE 2 TABLETS BY MOUTH 3 TIMES A DAY AS NEEDED (Patient taking differently: Take 100 mg by mouth 2 (two) times daily as needed for severe pain or moderate pain.) 540 tablet 0  . zolpidem (AMBIEN CR) 12.5 MG CR tablet TAKE 1 TABLET BY MOUTH AT BEDTIME AS NEEDED FOR SLEEP 90 tablet 0  Krause current facility-administered medications on file prior to visit.    Allergies  Allergen Reactions  . Hctz [Hydrochlorothiazide] Other (See Comments)    Hyponatremia   . Lisinopril Other  (See Comments)    Hyponatremia  . Sulfa Antibiotics Rash    Physical exam:  Today's Vitals   06/21/20 1356  BP: 128/72  Pulse: 69  Weight: 155 lb (70.3 kg)  Height: 5\' 5"  (1.651 m)   Body mass index is 25.79 kg/m.   Wt Readings from Last 3 Encounters:  06/21/20 155 lb (70.3 kg)  01/03/18 164 lb (74.4 kg)  08/06/17 159 lb (72.1 kg)     Ht Readings from Last 3 Encounters:  06/21/20 5\' 5"  (1.651 m)  01/03/18 5\' 5"  (1.651 m)  08/06/17 5\' 5"  (1.651 m)      General: The patient is awake, alert and appears not in acute distress. The patient is well groomed. Head: Normocephalic, atraumatic. Neck is supple.  Mallampati 1,  neck circumference:14.5  inches .  Nasal airflow  patent.  Retrognathia is not seen.  Dental status: intact  Cardiovascular:  Regular rate and cardiac rhythm by pulse,  without distended neck veins. Respiratory: Lungs are clear to auscultation.  Skin:  Without evidence of ankle edema, or rash. Trunk: The patient's posture is erect.   Neurologic exam : The patient is awake and alert, oriented to place and time.   Memory subjective described as intact.  Attention span & concentration ability appears normal.  Speech is fluent,  without  dysarthria, dysphonia or aphasia.  Mood and affect are appropriate.   Cranial nerves: Krause loss of smell or taste reported  Pupils are equal and briskly reactive to light. Funduscopic exam deferred.  Frequent blinking.  Extraocular movements in vertical and horizontal planes were intact and without nystagmus. Krause Diplopia. Visual fields by finger perimetry are intact. Hearing was intact to soft voice and finger rubbing.    Facial sensation intact to fine touch.  Facial motor strength is symmetric and tongue and uvula move midline but are clearly tremulous, and her jaw shows titubation. .  Neck ROM : rotation, tilt and flexion extension were normal for age and shoulder shrug was symmetrical.    Motor exam:  Symmetric bulk,  tone and ROM.   Normal tone without cog- wheeling, has symmetric grip strength .   Sensory:  Fine touch, pinprick and vibration were tested  and  normal.  Proprioception tested in the upper extremities was normal.   Coordination: Rapid alternating movements in the fingers/hands were of normal speed.  There is severe tremor in both hands.  The Finger-to-nose maneuver was intact without evidence of ataxia, with dysmetria and with tremor.   Gait and station: Patient could rise unassisted from a seated position, walked without assistive device.  Stance is of normal width/ base.  She has Krause shuffle and Krause loss of armswing.  Toe and heel walk were deferred. She turned with 3 steps- fluent movements. Krause stooped posture. Deep tendon reflexes: in the  upper and lower extremities are symmetric, brisk and intact.  Babinski response was deferred.        After spending a total time of 55 minutes face to face and additional time for physical and neurologic examination, review of laboratory studies,  personal review of imaging studies, reports and results of other testing and review of referral information / records as far as provided in visit, I have established the following assessments:  1) chronic Insomnia- on 12.5  mg Ambien.   2) essential tremor - I like to use mysoline - and start using it at night- after she has switched form ambien to Trazodone.  3) dysphonia- of essential tremor- Krause dysphagia .    My Plan is to proceed with:  1) I reduce the Ambien to 5 mg at night, prn use of a second dose- this is meant to not cause anxiety about the abrupt discontinuation of Ambien. 2) start trazodone at 25 mg at night- this will help with dry mouth and should not affect memory.  3) start Mysoline after Ambien is Krause longer used- she can start on . 25 mg at night, again only  to start.  4) DAT scan to differentiate tremor from PD ? - not needed at this time.   I would like to thank Kelton Pillar,  [place] [address] [place],  [place] [id number] for allowing me to meet with and to take care of this pleasant patient.     I plan to follow up  personally  within 2 month.   CC: I will share my notes with PCP.  Electronically signed by: Larey Seat, MD 06/21/2020 2:19 PM  [facility] Board certified by The [employer] of Sleep Medicine and Diplomate of the [employer] of Sleep Medicine. Board certified In Neurology through the [place], Fellow of the [employer] of Neurology. Medical Director of [employer].

## 2020-06-22 ENCOUNTER — Other Ambulatory Visit: Payer: Self-pay | Admitting: Neurology

## 2020-06-22 MED ORDER — ZOLPIDEM TARTRATE 5 MG PO TABS: 5.0000 mg | ORAL_TABLET | Freq: Every evening | ORAL | 0 refills | Status: DC | PRN

## 2020-06-22 NOTE — Telephone Encounter (Signed)
Called the patient back. Advised the only medication I saw Dr Brett Fairy sent in was trazodone. In reviewing the notes "I reduce the Ambien to 5 mg at night, prn use of a second dose- this is meant to not cause anxiety about the abrupt discontinuation of Ambien. 2) start trazodone at 25 mg at night- this will help with dry mouth and should not affect memory.  3) start Mysoline after Ambien is no longer used- she can start on . 25 mg at night, again only  to start."  Advised I would let Dr Brett Fairy know and see how Lorrin Mais should be ordered and sent for the patient. Advised would send today.

## 2020-06-22 NOTE — Telephone Encounter (Signed)
Pt states the zolpidem (AMBIEN CR) 12.5 MG CR tablet to [facility]  has not been called in yet

## 2020-06-22 NOTE — Addendum Note (Signed)
Addended by: Darleen Crocker on: 06/22/2020 02:55 PM   Modules accepted: Orders

## 2020-06-24 ENCOUNTER — Other Ambulatory Visit: Payer: Self-pay

## 2020-06-24 ENCOUNTER — Encounter: Payer: Self-pay | Admitting: Dermatology

## 2020-06-24 ENCOUNTER — Ambulatory Visit: Payer: PPO | Admitting: Dermatology

## 2020-06-24 DIAGNOSIS — L570 Actinic keratosis: Secondary | ICD-10-CM

## 2020-06-24 DIAGNOSIS — Z85828 Personal history of other malignant neoplasm of skin: Secondary | ICD-10-CM | POA: Diagnosis not present

## 2020-06-24 DIAGNOSIS — D489 Neoplasm of uncertain behavior, unspecified: Secondary | ICD-10-CM

## 2020-06-24 DIAGNOSIS — D485 Neoplasm of uncertain behavior of skin: Secondary | ICD-10-CM

## 2020-06-24 DIAGNOSIS — L820 Inflamed seborrheic keratosis: Secondary | ICD-10-CM | POA: Diagnosis not present

## 2020-06-24 DIAGNOSIS — Z1283 Encounter for screening for malignant neoplasm of skin: Secondary | ICD-10-CM

## 2020-06-24 DIAGNOSIS — L821 Other seborrheic keratosis: Secondary | ICD-10-CM | POA: Diagnosis not present

## 2020-06-24 NOTE — Patient Instructions (Signed)

## 2020-06-24 NOTE — Progress Notes (Signed)
   Follow-Up Visit   Subjective  [name] is a 79 y.o. female who presents for the following: Skin Problem (New moles on face & neck).  New spots on neck and left cheek Location:  Duration:  Quality:  Associated Signs/Symptoms: Modifying Factors:  Severity:  Timing: Context: History of multiple nonmelanoma skin cancers  Objective  Well appearing patient in no apparent distress; mood and affect are within normal limits. Objective  Left Malar Cheek: 4 mm cutaneous horn     Objective  Posterior Mid Neck: 4 mm pink pearly papule     Objective  Mid Tip of Nose (6): 3 mm flat pink crust  Objective  Neck - Anterior, Neck - Posterior: Half dozen excoriated, inflamed but clinically benign keratoses  Objective  Right Upper Back: Upper torso, head and neck exam: No atypical moles, melanoma, recurrent skin cancer.   A focused examination was performed including Head, neck, upper torso.. Relevant physical exam findings are noted in the Assessment and Plan.   Assessment & Plan    Neoplasm of uncertain behavior (2) Left Malar Cheek  Skin / nail biopsy Type of biopsy: tangential   Informed consent: discussed and consent obtained   Timeout: patient name, date of birth, surgical site, and procedure verified   Anesthesia: the lesion was anesthetized in a standard fashion   Anesthetic:  1% lidocaine w/ epinephrine 1-100,000 local infiltration Instrument used: flexible razor blade   Hemostasis achieved with: ferric subsulfate   Outcome: patient tolerated procedure well   Post-procedure details: sterile dressing applied and wound care instructions given   Dressing type: bandage and petrolatum    Specimen 1 - Surgical pathology Differential Diagnosis: bcc scc  Check Margins: No  Posterior Mid Neck  Skin / nail biopsy Type of biopsy: tangential   Informed consent: discussed and consent obtained   Timeout: patient name, date of birth, surgical site, and  procedure verified   Anesthesia: the lesion was anesthetized in a standard fashion   Anesthetic:  1% lidocaine w/ epinephrine 1-100,000 local infiltration Instrument used: flexible razor blade   Hemostasis achieved with: ferric subsulfate   Outcome: patient tolerated procedure well   Post-procedure details: sterile dressing applied and wound care instructions given   Dressing type: bandage and petrolatum    Specimen 2 - Surgical pathology Differential Diagnosis: bcc scc  Check Margins: No  AK (actinic keratosis) (6) Mid Tip of Nose  Patient did not tolerate freezing particularly well so only approximately 3-second freeze actually done.  Destruction of lesion - Mid Tip of Nose Complexity: simple   Destruction method: cryotherapy   Informed consent: discussed and consent obtained   Lesion destroyed using liquid nitrogen: Yes   Cryotherapy cycles:  3 Outcome: patient tolerated procedure well with no complications    Inflamed seborrheic keratosis (2) Neck - Anterior; Neck - Posterior  Destruction of lesion - Neck - Anterior, Neck - Posterior Complexity: simple   Destruction method: cryotherapy   Informed consent: discussed and consent obtained   Lesion destroyed using liquid nitrogen: Yes   Cryotherapy cycles:  4 Outcome: patient tolerated procedure well with no complications    Skin exam for malignant neoplasm Right Upper Back  Annual dermatologist examination, encouraged her to examine her own skin twice annually     I, Lavonna Monarch, MD, have reviewed all documentation for this visit.  The documentation on 06/24/20 for the exam, diagnosis, procedures, and orders are all accurate and complete.

## 2020-07-05 ENCOUNTER — Telehealth: Payer: Self-pay | Admitting: Dermatology

## 2020-07-05 NOTE — Telephone Encounter (Signed)
Results

## 2020-07-05 NOTE — Telephone Encounter (Signed)
Path to patient  

## 2020-07-06 DIAGNOSIS — H2000 Unspecified acute and subacute iridocyclitis: Secondary | ICD-10-CM | POA: Diagnosis not present

## 2020-07-06 DIAGNOSIS — H02054 Trichiasis without entropian left upper eyelid: Secondary | ICD-10-CM | POA: Diagnosis not present

## 2020-07-13 DIAGNOSIS — H02054 Trichiasis without entropian left upper eyelid: Secondary | ICD-10-CM | POA: Diagnosis not present

## 2020-07-19 ENCOUNTER — Ambulatory Visit (INDEPENDENT_AMBULATORY_CARE_PROVIDER_SITE_OTHER): Payer: PPO | Admitting: Neurology

## 2020-07-19 DIAGNOSIS — F5101 Primary insomnia: Secondary | ICD-10-CM

## 2020-07-19 DIAGNOSIS — Z87898 Personal history of other specified conditions: Secondary | ICD-10-CM

## 2020-07-19 DIAGNOSIS — R251 Tremor, unspecified: Secondary | ICD-10-CM

## 2020-07-19 DIAGNOSIS — M797 Fibromyalgia: Secondary | ICD-10-CM

## 2020-07-19 DIAGNOSIS — F5104 Psychophysiologic insomnia: Secondary | ICD-10-CM

## 2020-07-19 DIAGNOSIS — G4733 Obstructive sleep apnea (adult) (pediatric): Secondary | ICD-10-CM | POA: Diagnosis not present

## 2020-07-19 DIAGNOSIS — G250 Essential tremor: Secondary | ICD-10-CM

## 2020-07-27 DIAGNOSIS — Z87898 Personal history of other specified conditions: Secondary | ICD-10-CM | POA: Insufficient documentation

## 2020-07-27 NOTE — Procedures (Signed)
[facility] ([facility])  STUDY DATE: 07/21/20  DOB: 1941-03-15  MRN: [id number]  ORDERING CLINICIAN: Larey Seat, MD   REFERRING CLINICIAN: Kelton Pillar, MD   CLINICAL INFORMATION/HISTORY: [name] is a 80 - year -old White female patient and was seen upon referral on 06/21/2020 from Dr Laurann Montana.  Chief concern according to patient :  [name] is a 80 y.o. female and seen here for re-evaluation of TREMOR. She also has severe insomnia for 40 years, is using sleep aids for 25 years- and wants to be able to sleep without it.She also mentioned fibromyalgia, which we don't address in this neurological office.   She has a past medical history of BCC (basal cell carcinoma of skin) (03/27/2006), Benign essential tremor, Chronic kidney disease, Essential hypertension, benign, Fibromyalgia, GERD (gastroesophageal reflux disease), Heart murmur, Hemorrhoids, Hyponatremia, Low back pain, Occipital neuralgia, Osteoarthritis, Osteopenia, Pure hypercholesterolemia (12/16/2013), SCCA (squamous cell carcinoma) of skin (07/15/2002), SCCA (squamous cell carcinoma) of skin (05/06/2004), SCCA (squamous cell carcinoma) of skin (01/09/2011), SCCA (squamous cell carcinoma) of skin (01/05/2014), SCCA (squamous cell carcinoma) of skin (09/30/2014), Shortness of breath dyspnea, Superficial basal cell carcinoma (BCC) (08/17/1982), and Tuberculosis.  Epworth sleepiness score: 9/24.  BMI: 25.7 kg/m  Neck Circumference: 15 "  FINDINGS:   Total Record Time (hours, min): 8 h 33 min Total Sleep Time (hours, min):  7 h 57 min  Percent REM (%):    22.20 %   Calculated pAHI (per hour): 16.8       REM pAHI:   29.7     NREM pAHI: 13.1  Supine AHI: 19.9   Oxygen Saturation (%) Mean: 92  Minimum oxygen saturation (%):        87    O2 Saturation Range (%): 87-97  O2Saturation (minutes) <=88%: 0.2 min   Pulse Mean (bpm):    62   Pulse Range (54-84)   IMPRESSION: This Home Sleep Test  confirmed the presence of  Mild- moderate OSA (obstructive sleep apnea) which can contribute to the patients non restorative sleep, Insomnia.   RECOMMENDATION: I would strongly recommend an autotitration CPAP device to treat this REM sleep dependent apnea. The goal is to reduce the AHI to 5/h or less and to achieve respiratory safety for a patient on Primidone and sleep aids.   I suggest to use CPAP with heated humidity, a nasal cradle or ESON mask and settings from 5-15 cm water, 2 cm EPR.  I will also continue to treat the essential tremor with Mysoline/ Primidone.    INTERPRETING PHYSICIAN: Larey Seat, MD 06/21/2020 2:19 PM [facility] Board certified by [employer] of Sleep Medicine and  Fellow of the [employer] of Neurology. Medical Director of [employer].

## 2020-07-27 NOTE — Addendum Note (Signed)
Addended by: Larey Seat on: 07/27/2020 05:49 PM   Modules accepted: Orders

## 2020-07-27 NOTE — Progress Notes (Signed)
IMPRESSION: This Home Sleep Test confirmed the presence of  Mild- moderate OSA (obstructive sleep apnea) which can contribute to the patient's non-restorative sleep, Insomnia. The patient sleeps with the help of primidone, prescribed for essential tremor.   RECOMMENDATION: I would strongly recommend an autotitration CPAP device to treat this REM sleep dependent apnea. The goal is to reduce the AHI to 5/h or less and to achieve respiratory safety for a patient on Primidone and sleep aids.   I suggest to use CPAP with heated humidity, a nasal cradle or ESON mask and settings from 5-15 cm water, 2 cm EPR.  I will also continue to treat the essential tremor with Mysoline/ Primidone.

## 2020-07-27 NOTE — Progress Notes (Signed)
   [facility])  STUDY DATE: 07/21/20  DOB: 1940-11-22  MRN: [id number]  ORDERING CLINICIAN: Larey Seat, MD   REFERRING CLINICIAN: Kelton Pillar, MD   CLINICAL INFORMATION/HISTORY: [name] is a 80 - year -old White female patient and was seen upon referral on 06/21/2020 from Dr Laurann Montana.  Chief concern according to patient :  [name] is a 80 y.o. female and seen here for re-evaluation of TREMOR. She also has severe insomnia for 40 years, is using sleep aids for 25 years- and wants to be able to sleep without it.She also mentioned fibromyalgia, which we don't address in this neurological office.   She has a past medical history of BCC (basal cell carcinoma of skin) (03/27/2006), Benign essential tremor, Chronic kidney disease, Essential hypertension, benign, Fibromyalgia, GERD (gastroesophageal reflux disease), Heart murmur, Hemorrhoids, Hyponatremia, Low back pain, Occipital neuralgia, Osteoarthritis, Osteopenia, Pure hypercholesterolemia (12/16/2013), SCCA (squamous cell carcinoma) of skin (07/15/2002), SCCA (squamous cell carcinoma) of skin (05/06/2004), SCCA (squamous cell carcinoma) of skin (01/09/2011), SCCA (squamous cell carcinoma) of skin (01/05/2014), SCCA (squamous cell carcinoma) of skin (09/30/2014), Shortness of breath dyspnea, Superficial basal cell carcinoma (BCC) (08/17/1982), and Tuberculosis.  Epworth sleepiness score: 9/24.  BMI: 25.7 kg/m  Neck Circumference: 15 "  FINDINGS:   Total Record Time (hours, min): 8 h 33 min Total Sleep Time (hours, min):  7 h 57 min  Percent REM (%):    22.20 %   Calculated pAHI (per hour): 16.8       REM pAHI:   29.7     NREM pAHI: 13.1  Supine AHI: 19.9   Oxygen Saturation (%) Mean: 92  Minimum oxygen saturation (%):        87    O2 Saturation Range (%): 87-97  O2Saturation (minutes) <=88%: 0.2 min   Pulse Mean (bpm):    62   Pulse Range (54-84)   IMPRESSION: This Home Sleep  Test confirmed the presence of  Mild- moderate OSA (obstructive sleep apnea) which can contribute to the patients non restorative sleep, Insomnia.   RECOMMENDATION: I would strongly recommend an autotitration CPAP device to treat this REM sleep dependent apnea. The goal is to reduce the AHI to 5/h or less and to achieve respiratory safety for a patient on Primidone and sleep aids.   I suggest to use CPAP with heated humidity, a nasal cradle or ESON mask and settings from 5-15 cm water, 2 cm EPR.  I will also continue to treat the essential tremor with Mysoline/ Primidone.    INTERPRETING PHYSICIAN: Larey Seat, MD 06/21/2020 2:19 PM [facility] [employer] Board certified by [employer] of Sleep Medicine and  Fellow of the [employer] of Neurology. Medical Director of [employer].

## 2020-07-28 ENCOUNTER — Telehealth: Payer: Self-pay | Admitting: Neurology

## 2020-07-28 NOTE — Telephone Encounter (Signed)
-----   Message from Larey Seat, MD sent at 07/27/2020  5:49 PM EST ----- IMPRESSION: This Home Sleep Test confirmed the presence of  Mild- moderate OSA (obstructive sleep apnea) which can contribute to the patient's non-restorative sleep, Insomnia. The patient sleeps with the help of primidone, prescribed for essential tremor.   RECOMMENDATION: I would strongly recommend an autotitration CPAP device to treat this REM sleep dependent apnea. The goal is to reduce the AHI to 5/h or less and to achieve respiratory safety for a patient on Primidone and sleep aids.   I suggest to use CPAP with heated humidity, a nasal cradle or ESON mask and settings from 5-15 cm water, 2 cm EPR.  I will also continue to treat the essential tremor with Mysoline/ Primidone.

## 2020-07-28 NOTE — Telephone Encounter (Signed)
Called patient to discuss sleep study results. No answer at this time. LVM for the patient to call back.   

## 2020-07-29 NOTE — Telephone Encounter (Signed)
Called the patient and reviewed her sleep study results.  Advised Dr. Brett Fairy is recommending starting an auto CPAP machine.  Patient is very reluctant to move forward with the treatment plan.  Patient has an appointment next month and would like to wait and discuss further with Dr. Brett Fairy.  Patient was appreciated for the results and had no further questions at this time.

## 2020-08-23 ENCOUNTER — Ambulatory Visit: Payer: PPO | Admitting: Neurology

## 2020-08-23 ENCOUNTER — Encounter: Payer: Self-pay | Admitting: Neurology

## 2020-08-23 VITALS — BP 136/72 | HR 74 | Ht 65.0 in | Wt 156.0 lb

## 2020-08-23 DIAGNOSIS — R490 Dysphonia: Secondary | ICD-10-CM | POA: Diagnosis not present

## 2020-08-23 DIAGNOSIS — R251 Tremor, unspecified: Secondary | ICD-10-CM | POA: Diagnosis not present

## 2020-08-23 DIAGNOSIS — G250 Essential tremor: Secondary | ICD-10-CM | POA: Diagnosis not present

## 2020-08-23 DIAGNOSIS — G4733 Obstructive sleep apnea (adult) (pediatric): Secondary | ICD-10-CM | POA: Insufficient documentation

## 2020-08-23 MED ORDER — TRAZODONE HCL 50 MG PO TABS
50.0000 mg | ORAL_TABLET | Freq: Every day | ORAL | 3 refills | Status: DC
Start: 2020-08-23 — End: 2021-01-27

## 2020-08-23 MED ORDER — PRIMIDONE 50 MG PO TABS: ORAL_TABLET | ORAL | 5 refills | Status: DC

## 2020-08-23 NOTE — Patient Instructions (Signed)
KEEPING IT CLEAN: CPAP HYGIENE PROPER UPKEEP OF YOUR CPAP MACHINE CAN HELP ENSURE THE DEVICE FUNCTIONS PROPERLY CPAP CLEANING INSTRUCTIONS Along with proper CPAP cleaning it is recommended that you replace your mask, tubing and filters once very 3 months and more frequently if you are sick.   DAILY CLEANING Do not use moisturizing soaps, bleach, scented oils, chlorine, or alcohol-based solutions to clean your supplies. These solutions may cause irritation to your skin and lungs and may reduce the life of your products. [employer] or Comparable works best for daily cleaning.  **If you've been sick, it's smart to wash your mask, tubing, humidifier and filter daily until your cold, flu or virus symptoms are gone. That can help reduce the amount of time you spend under the weather.  Before using your mask -wash your face daily with soap and water to remove excess facial oils. Wipe down your mask (including areas that come in contact with your skin) using a damp towel with soap and warm water. This will remove any oils, dead skin cells, and sweat on the mask that can affect the quality of the seal. Gently rinse with a clean towel and let the mask air-dry out of direct sunlight. You can also use unscented baby wipes or pre-moistened towels designed specifically for cleaning CPAP masks, which are available on-line. DO NOT USE CLOROX OR DISINFECTING WIPES. If your unit has a humidifier, empty any leftover water instead of letting in sit in the unit all day. Refill the humidifier with clean, distilled water right before bedtime for optimal use WEEKLY (OR MORE FREQUENT) CLEANING Your mask and tubing need a full bath at least once a week to keep it free of dust, bacteria, and germs. (During COVID-19 or any other flu/virus we recommend more frequent cleaning) Clean the CPAP tubing, nasal mask, and headgear in a bathroom sink filled with warm water and a few drops of ammonia-free, mild dish detergent. Avoid  using stronger cleaning products, as they may damage the mask or leave harmful residue. Swirl all parts around for about five minutes, rinse well and let air dry during the day. Hang the tubing over the shower rod, on a towel rack or in the laundry room to ensure all the water drips out. The mask and headgear can be air-dried on a towel or hung on a hook or hanger. You should also wipe down your CPAP machine with a damp cloth. Ensure the unit is unplugged. The towel shouldn't be too damp or wet, as water could get into the machine. Clean the filter by removing it and rinsing it in warm tap water. Run it under the water and squeeze to make sure there is no dust. Then blot down the filter with a towel. Do not wash your machine's white filter, if one is present-those are disposable and should be replaced every two weeks. If you are recovering from being sick, we recommend changing the filter sooner. If your CPAP has a humidifier, that also needs to be cleaned weekly. Empty any remaining water and then wash the water chamber in the sink with warm soapy water. Rinse well and drain out as much of the water as possible. Let the chamber air-dry before placing it back into the CPAP unit. Every other week you should disinfect the humidifier. Do that by soaking it in a solution of one-part vinegar to five parts water for 30 minutes, thoroughly rinsing and then placing in your dishwasher's top rack for washing. And keep  it clean by using only distilled water to prevent mineral deposits that can build up and cause damage to your machine. IMPORTANT TIPS Make caring for your CPAP equipment part of your morning routine. Keep machine and accessories out of direct sunlight to avoid damaging them. Never use bleach to clean accessories. Place machine on a level surface and away from curtains that may interfere with the air intake. Keep track of when you should order replacement parts for your mask and accessories so that  you always get the most out of your CPAP. You can also sign up for Auto Supply by contacting our DME department at [email or web address] The following are examples of soap that may be used: [employer], [place] soap (plain).  With a little upkeep, your CPAP can continue to help you breathe better for a long time. Just a few minutes a day can help keep your CPAP running efficiently for years to come.  If you have a CPAP, but are struggling with compliance, check out our no mask oral appliance, for those with mild to moderate sleep apnea.  Call and schedule a consultation with one of our sleep medicine physicians, or ask your doctor about a sleep referral to the [facility].   CPAP and BPAP Information CPAP and BPAP are methods that use air pressure to keep your airways open and to help you breathe well. CPAP and BPAP use different amounts of pressure. Your health care provider will tell you whether CPAP or BPAP would be more helpful for you.  CPAP stands for "continuous positive airway pressure." With CPAP, the amount of pressure stays the same while you breathe in and out.  BPAP stands for "bi-level positive airway pressure." With BPAP, the amount of pressure will be higher when you breathe in (inhale) and lower when you breathe out(exhale). This allows you to take larger breaths. CPAP or BPAP may be used in the hospital, or your health care provider may want you to use it at home. You may need to have a sleep study before your health care provider can order a machine for you to use at home. Why are CPAP and BPAP treatments used? CPAP or BPAP can be helpful if you have:  Sleep apnea.  Chronic obstructive pulmonary disease (COPD).  Heart failure.  Medical conditions that cause muscle weakness, including muscular dystrophy or amyotrophic lateral sclerosis (ALS).  Other problems that cause breathing to be shallow, weak, abnormal, or difficult. CPAP and  BPAP are most commonly used for obstructive sleep apnea (OSA) to keep the airways from collapsing when the muscles relax during sleep. How is CPAP or BPAP administered? Both CPAP and BPAP are provided by a small machine with a flexible plastic tube that attaches to a plastic mask that you wear. Air is blown through the mask into your nose or mouth. The amount of pressure that is used to blow the air can be adjusted on the machine. Your health care provider will set the pressure setting and help you find the best mask for you. When should CPAP or BPAP be used? In most cases, the mask only needs to be worn during sleep. Generally, the mask needs to be worn throughout the night and during any daytime naps. People with certain medical conditions may also need to wear the mask at other times when they are awake. Follow instructions from your health care provider about when to use the machine. What are some tips for using the  mask?  Because the mask needs to be snug, some people feel trapped or closed-in (claustrophobic) when first using the mask. If you feel this way, you may need to get used to the mask. One way to do this is to hold the mask loosely over your nose or mouth and then gradually apply the mask more snugly. You can also gradually increase the amount of time that you use the mask.  Masks are available in various types and sizes. If your mask does not fit well, talk with your health care provider about getting a different one. Some common types of masks include: ? Full face masks, which fit over the mouth and nose. ? Nasal masks, which fit over the nose. ? Nasal pillow or prong masks, which fit into the nostrils.  If you are using a mask that fits over your nose and you tend to breathe through your mouth, a chin strap may be applied to help keep your mouth closed.  Some CPAP and BPAP machines have alarms that may sound if the mask comes off or develops a leak.  If you have trouble with the  mask, it is very important that you talk with your health care provider about finding a way to make the mask easier to tolerate. Do not stop using the mask. There could be a negative impact to your health if you stop using the mask.   What are some tips for using the machine?  Place your CPAP or BPAP machine on a secure table or stand near an electrical outlet.  Know where the on/off switch is on the machine.  Follow instructions from your health care provider about how to set the pressure on your machine and when you should use it.  Do not eat or drink while the CPAP or BPAP machine is on. Food or fluids could get pushed into your lungs by the pressure of the CPAP or BPAP.  For home use, CPAP and BPAP machines can be rented or purchased through home health care companies. Many different brands of machines are available. Renting a machine before purchasing may help you find out which particular machine works well for you. Your insurance may also decide which machine you may get.  Keep the CPAP or BPAP machine and attachments clean. Ask your health care provider for specific instructions. Follow these instructions at home:  Do not use any products that contain nicotine or tobacco, such as cigarettes, e-cigarettes, and chewing tobacco. If you need help quitting, ask your health care provider.  Keep all follow-up visits as told by your health care provider. This is important. Contact a health care provider if:  You have redness or pressure sores on your head, face, mouth, or nose from the mask or head gear.  You have trouble using the CPAP or BPAP machine.  You cannot tolerate wearing the CPAP or BPAP mask.  Someone tells you that you snore even when wearing your CPAP or BPAP. Get help right away if:  You have trouble breathing.  You feel confused. Summary  CPAP and BPAP are methods that use air pressure to keep your airways open and to help you breathe well.  You may need to have a  sleep study before your health care provider can order a machine for home use.  If you have trouble with the mask, it is very important that you talk with your health care provider about finding a way to make the mask easier to tolerate. Do  not stop using the mask. There could be a negative impact to your health if you stop using the mask.  Follow instructions from your health care provider about when to use the machine. This information is not intended to replace advice given to you by your health care provider. Make sure you discuss any questions you have with your health care provider. Document Revised: 07/18/2019 Document Reviewed: 07/21/2019 Elsevier Patient Education  2021 [employer].

## 2020-08-23 NOTE — Progress Notes (Addendum)
SLEEP MEDICINE CLINIC    Provider:  Larey Seat, MD  Primary Care Physician:  Briana Pillar, MD [address] [place] [id number]     Referring Provider: Kelton Pillar, Md [address] [place],  [place] [id number]          Chief Complaint according to patient   Patient presents with:    . New Patient (Initial Visit)         Chief Complaint  Patient presents with  . New Patient (Initial Visit)    Pt alone, rm 10. Presented to Dr [name], transfer of care. Was a pt of Dr [name] and he treated her for essential tremor. She has noted a benefit for tremor from Phenobarbitol and can sleep as well.      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 Laurann Krause for re-evaluation of TREMOR. She has severe insomnia for 40 years, is using sleep aids for 25 years- and wants to be able to sleep without it. She also mentioned fibromyalgia, which we don't address in this neurological office. [name] is a 73 - year -old white female patient seen in a RV on 08/23/2020. She brought me a very kind note in which she mentioned that she had not been able previously to hand write such notes for a while so the treatment with phenobarbital seems to have helped the tremor significantly and has replaced the Ambien as a sleep aid.  I also obtained a home sleep test on 10 January which confirmed the presence of mild to moderate sleep apnea.  I would like for the patient to give CPAP a trial be ordered a machine for a setting from 5-15 cmH2O was 2 cm EPR.  My goal is to make sure that the phenobarbital is not worsening any apnea at night.  So this is also a safety concern for the medication that she works with.  In addition I think it may help with memory and daytime restfulness.  It can also help with hypertension, occasional she reported shortness of  breath and dyspnea on the time.  I would like to add that the result of the sleep study was based on a 7-hour 57 minutes sleep.  At home with 22% REM sleep, the AHI was 16.8, in rem sleep 29.7, in supine sleep 19.9.  She did not have significant oxygen desaturation so I feel from that perspective that phenobarbital is safe but would be safer if we also treat the apnea.      HISTORY OF PRESENT ILLNESS:  [name] is a 1 - year -old White female patient seen here upon a referral on 08/23/2020 from dr [name].  Chief concern according to patient :  [name] is a 80 y.o. female and seen here as a referral from Dr. Laurann Krause for re-evaluation of TREMOR. She has severe insomnia for 40 years, is using sleep aids for 25 years- and wants to be able to sleep without it. She also mentioned fibromyalgia, which we don't address in this neurological office.  I have the pleasure of seeing [name] on 06-21-2020, a right -handedr Caucasian female with a chronic insonia disorder.  She   has a past medical history of BCC (basal cell carcinoma of skin) (03/27/2006), Benign essential tremor, Chronic kidney disease,  Essential hypertension, benign, Fibromyalgia, GERD (gastroesophageal reflux disease), Heart murmur, Hemorrhoids, Hyponatremia from Lisinopril, Low back pain, Occipital neuralgia, Osteoarthritis, Osteopenia, Pure hypercholesterolemia (12/16/2013), Shortness of breath dyspnea, Superficial basal cell carcinoma (BCC) (08/17/1982), and pleurosia by Tuberculosis, treated  in Teenage in [place], [place]  .    [name] reports that her tremors have become a part of the overall discomfort in social situations, this is her first visit with me and she has previously seen Dr. Jeneen Krause love and about 5 years ago she had seen Dr. Lora Krause.  His last visit with Dr. Clarene Krause was in December 2013 and his first visit was around 2006 when she initially presented with a trigeminal neuralgia pain.  She had normal brain MRIs and MRAs  of that year she was tried on [place] in 2009 by Dr. Erling Krause after he suspected that she had fibromyalgia.  She has had insomnia for well over a decade and she had chronic hyponatremia which had been also mentioned by Dr. Erling Krause.  Over the last decade she has noticed more more difficulties with handwriting and hand tremors and sometimes she feels uneasy in the company of others especially when eating with others.  Most recently she was at in the process of renewing her driver's license and the clerk asked her if she if she was cognitively in any way impaired and obviously just is from her signature and not from her from any kind of other exchange of information or any discussion.  She has noted that her essential tremor is more severe than the one that she had witnessed in her mother and she may also have had this essential tremor much earlier than her mother manifested.  These tremors have just gradually become worse she feels there is more impairment in her right dominant side, also tremors are seen on both sides.  She also mentioned her chronic insomnia she is a non-smoker she does not drink caffeine she may drink 1 alcoholic beverage a day, but she has become dependent on Ambien extended release.  She has had 1 sibling- a sister- who was not affected by tremor.   The patient never had a  sleep study. She never used Trazodone for insomnia, She never used Mysoline for her tremor. She is concerned about Parkinson's disease.       Social history: she lost her husband in a plane crash ( his age 58, hers 66) and she moved with 2 young children (aged 21 and 52 ) from [place]  back to [place].   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  lives in a household alone.lives in a small private house with her cat.   Family status is divorced, 2 children, 1 living daughter. Lost son at age 29.   Tobacco use- none .  ETOH use 1 a day.  Caffeine  intake is minimal.  Regular exercise in form of walking.        Sleep habits are as follows: The patient's dinner time is between 7 PM. The patient goes to bed at 10.30 PM and continues to sleep for 6-7  hours, has no bathroom breaks,.   The preferred sleep position is right sided , with the support of 2 pillows.  Dreams are reportedly rare and only occur in morning hours, after returning to sleep after feeding the cat. 7 AM is the usual rise time. The patient wakes up spontaneously  She reports  feeling refreshed /  restored in AM, with symptoms such as dry mouth , no headaches or neck pain in AM , and residual fatigue.  Naps are taken infrequently.   Review of Systems: Out of a complete 14 system review, the patient complains of only the following symptoms, and all other reviewed systems are negative.:  Fatigue, sleepiness , snoring, fragmented sleep, Insomnia is chronic- and currently controlled with the same medication that control her tremors.    No restless legs, but some myoclonic jerk, has dysphonia , essential tremor .    How likely are you to doze in the following situations: 0 = not likely, 1 = slight chance, 2 = moderate chance, 3 = high chance   Sitting and Reading? Watching Television? Sitting inactive in a public place (theater or meeting)? As a passenger in a car for an hour without a break? Lying down in the afternoon when circumstances permit? Sitting and talking to someone? Sitting quietly after lunch without alcohol? In a car, while stopped for a few minutes in traffic?   Total = 5/ 24 points   FSS endorsed at 14/ 63 points.   Social History   Socioeconomic History  . Marital status: Divorced    Spouse name: Not on file  . Number of children: 2  . Years of education: Masters  . Highest education level: Not on file  Occupational History  . Occupation: Retired  Tobacco Use  . Smoking status: Never Smoker  . Smokeless tobacco: Never Used  Vaping Use  .  Vaping Use: Never used  Substance and Sexual Activity  . Alcohol use: Yes    Alcohol/week: 0.0 standard drinks  . Drug use: No  . Sexual activity: Not on file  Other Topics Concern  . Not on file  Social History Narrative   Denies caffeine use    Social Determinants of Health   Financial Resource Strain: Not on file  Food Insecurity: Not on file  Transportation Needs: Not on file  Physical Activity: Not on file  Stress: Not on file  Social Connections: Not on file    Family History  Problem Relation Age of Onset  . Heart disease Father   . Prostate cancer Father   . Heart disease Mother   . Cancer - Ovarian Mother   . Congestive Heart Failure Mother   . Stroke Sister     Past Medical History:  Diagnosis Date  . BCC (basal cell carcinoma of skin) 03/27/2006   Above Left Upper Lip (MOH's)  . Benign essential tremor   . Chronic kidney disease   . Essential hypertension, benign   . Fibromyalgia   . GERD (gastroesophageal reflux disease)   . Heart murmur   . Hemorrhoids   . Hyponatremia    secondary to hctz-recurrent March 2010  . Low back pain    facet joint pain  . Occipital neuralgia   . Osteoarthritis   . Osteopenia   . Pure hypercholesterolemia 12/16/2013  . SCCA (squamous cell carcinoma) of skin 07/15/2002   Left Inner Cheek(in situ)  . SCCA (squamous cell carcinoma) of skin 05/06/2004   Right Wing Nose(in situ) -Cx3,5FU  . SCCA (squamous cell carcinoma) of skin 01/09/2011   Above Right Upper Lip(in situ)-Cx3  . SCCA (squamous cell carcinoma) of skin 01/05/2014   Left Temple(in situ)-Cx3,5FU  . SCCA (squamous cell carcinoma) of skin 09/30/2014   Right Wrist(in situ)-tx p bx  . Shortness of breath dyspnea   . Superficial basal cell carcinoma (BCC) 08/17/1982  Right Side Upper Lip  . Tuberculosis    as a child    Past Surgical History:  Procedure Laterality Date  . TOTAL KNEE ARTHROPLASTY Left 11/08/2015   Procedure: LEFT TOTAL KNEE ARTHROPLASTY;   Surgeon: Gaynelle Arabian, MD;  Location: [facility];  Service: Orthopedics;  Laterality: Left;  . TUBAL LIGATION       Current Outpatient Medications on File Prior to Visit  Medication Sig Dispense Refill  . amLODipine (NORVASC) 5 MG tablet TAKE ONE TABLET BY MOUTH DAILY 90 tablet 2  . atenolol (TENORMIN) 25 MG tablet TAKE ONE TABLET BY MOUTH DAILY 90 tablet 1  . diazepam (VALIUM) 10 MG tablet Take 1.25 mg by mouth daily as needed for anxiety.    . gabapentin (NEURONTIN) 100 MG capsule Take 100 mg by mouth 2 (two) times daily.    . pregabalin (LYRICA) 75 MG capsule Take 75 mg by mouth 2 (two) times daily.    . primidone (MYSOLINE) 50 MG tablet 0.5 tab at night time, increase to bid after 30 days. 45 tablet 1  . SALINE NA Place 1 spray into both nostrils every 2 (two) hours as needed (dryness).     . simvastatin (ZOCOR) 40 MG tablet Take 1 tablet (40 mg total) by mouth at bedtime. 90 tablet 2  . traMADol (ULTRAM) 50 MG tablet TAKE 2 TABLETS BY MOUTH 3 TIMES A DAY AS NEEDED (Patient taking differently: Take 100 mg by mouth 2 (two) times daily as needed for severe pain or moderate pain.) 540 tablet 0  . traZODone (DESYREL) 50 MG tablet Take 1 tablet (50 mg total) by mouth at bedtime. 30 tablet 3   No current facility-administered medications on file prior to visit.    Allergies  Allergen Reactions  . Hctz [Hydrochlorothiazide] Other (See Comments)    Hyponatremia   . Lisinopril Other (See Comments)    Hyponatremia  . Sulfa Antibiotics Rash    Physical exam:  Today's Vitals   08/23/20 1323  BP: 136/72  Pulse: 74  Weight: 156 lb (70.8 kg)  Height: 5\' 5"  (1.651 m)   Body mass index is 25.96 kg/m.   Wt Readings from Last 3 Encounters:  08/23/20 156 lb (70.8 kg)  06/21/20 155 lb (70.3 kg)  01/03/18 164 lb (74.4 kg)     Ht Readings from Last 3 Encounters:  08/23/20 5\' 5"  (1.651 m)  06/21/20 5\' 5"  (1.651 m)  01/03/18 5\' 5"  (1.651 m)      General: The patient is awake, alert and  appears not in acute distress. The patient is well groomed. Head: Normocephalic, atraumatic. Neck is supple.  Mallampati 1,  neck circumference:14.5  inches .  Nasal airflow  patent.  Retrognathia is not seen.  Dental status: intact  Cardiovascular:  Regular rate and cardiac rhythm by pulse,  without distended neck veins. Respiratory: Lungs are clear to auscultation.  Skin:  Without evidence of ankle edema, or rash. Trunk: The patient's posture is erect.   Neurologic exam : The patient is awake and alert, oriented to place and time.   Memory subjective described as intact.  Attention span & concentration ability appears normal.  Speech is fluent,  without  dysarthria, dysphonia or aphasia.  Mood and affect are appropriate.   Cranial nerves: no loss of smell or taste reported  Pupils are equal and briskly reactive to light. Funduscopic exam deferred.  Frequent blinking.  Extraocular movements in vertical and horizontal planes were intact and without nystagmus. No Diplopia.  Visual fields by finger perimetry are intact. Hearing was intact to soft voice and finger rubbing.    Facial sensation intact to fine touch.  Facial motor strength is symmetric and tongue and uvula move midline but are clearly tremulous, and her jaw shows titubation. .  Neck ROM : rotation, tilt and flexion extension were normal for age and shoulder shrug was symmetrical.    Motor exam:  Symmetric bulk, tone and ROM.   Normal tone without cog- wheeling, has symmetric grip strength .   Sensory:  Fine touch, pinprick and vibration were tested  and  normal.  Proprioception tested in the upper extremities was normal.   Coordination: Rapid alternating movements in the fingers/hands were of normal speed.  There is severe tremor in both hands.  The Finger-to-nose maneuver was intact without evidence of ataxia, with dysmetria and with tremor.   Gait and station: Patient could rise unassisted from a seated position,  walked without assistive device.  Stance is of normal width/ base.  She has no shuffle and no loss of armswing.  Toe and heel walk were deferred. She turned with 3 steps- fluent movements. No stooped posture. Deep tendon reflexes: in the  upper and lower extremities are symmetric, brisk and intact.  Babinski response was deferred.        After spending a total time of 30 minutes face to face and additional time for physical and neurologic examination, review of laboratory studies,  personal review of imaging studies, reports and results of other testing and review of referral information / records as far as provided in visit, I have established the following assessments:  1) Newly diagnosed OSA - has not yet received a CPAP - she wanted to meet first before starting therapy, I assured her she can get a nasal pillow  2) chronic Insomnia- was on 12.5 mg Ambien. Now sleeping well for 6-8 hours on phenobarbital , which was used for tremor control.  She has been insomniac since her son died in his sleep at her home- while recovering from a back surgery. The medication he used caused him to die in his sleep- and she never slept well again.   2) essential tremor - controlled under Mysoline/ phenobarbital and we started using it at night-  3) dysphonia- of essential tremor- no dysphagia , except for large  pills. .    My Plan is to proceed with: refill of mysoline and starting CPAP therapy with a nasal pillow. Rv after 3-4 month of CPAP use.    I would like to thank [name], [place] [address] [place],  [place] [id number] for allowing me to meet with and to take care of this pleasant patient.    CC: I will share my notes with PCP.  Electronically signed by: Briana Seat, MD 08/23/2020 1:35 PM  [facility] and [facility] Sleep Board certified by The [employer] of Sleep Medicine and Fellow of the [employer] of Neurology. Medical Director of [employer].

## 2020-09-08 DIAGNOSIS — Z1389 Encounter for screening for other disorder: Secondary | ICD-10-CM | POA: Diagnosis not present

## 2020-09-08 DIAGNOSIS — I129 Hypertensive chronic kidney disease with stage 1 through stage 4 chronic kidney disease, or unspecified chronic kidney disease: Secondary | ICD-10-CM | POA: Diagnosis not present

## 2020-09-08 DIAGNOSIS — N183 Chronic kidney disease, stage 3 unspecified: Secondary | ICD-10-CM | POA: Diagnosis not present

## 2020-09-08 DIAGNOSIS — E559 Vitamin D deficiency, unspecified: Secondary | ICD-10-CM | POA: Diagnosis not present

## 2020-09-08 DIAGNOSIS — E7800 Pure hypercholesterolemia, unspecified: Secondary | ICD-10-CM | POA: Diagnosis not present

## 2020-09-08 DIAGNOSIS — R609 Edema, unspecified: Secondary | ICD-10-CM | POA: Diagnosis not present

## 2020-09-08 DIAGNOSIS — G4733 Obstructive sleep apnea (adult) (pediatric): Secondary | ICD-10-CM | POA: Diagnosis not present

## 2020-09-08 DIAGNOSIS — G479 Sleep disorder, unspecified: Secondary | ICD-10-CM | POA: Diagnosis not present

## 2020-09-08 DIAGNOSIS — G250 Essential tremor: Secondary | ICD-10-CM | POA: Diagnosis not present

## 2020-09-08 DIAGNOSIS — M8588 Other specified disorders of bone density and structure, other site: Secondary | ICD-10-CM | POA: Diagnosis not present

## 2020-09-08 DIAGNOSIS — M797 Fibromyalgia: Secondary | ICD-10-CM | POA: Diagnosis not present

## 2020-09-08 DIAGNOSIS — Z0000 Encounter for general adult medical examination without abnormal findings: Secondary | ICD-10-CM | POA: Diagnosis not present

## 2020-09-28 DIAGNOSIS — M8589 Other specified disorders of bone density and structure, multiple sites: Secondary | ICD-10-CM | POA: Diagnosis not present

## 2020-10-04 DIAGNOSIS — H53002 Unspecified amblyopia, left eye: Secondary | ICD-10-CM | POA: Diagnosis not present

## 2020-10-04 DIAGNOSIS — Z961 Presence of intraocular lens: Secondary | ICD-10-CM | POA: Diagnosis not present

## 2020-10-04 DIAGNOSIS — H25811 Combined forms of age-related cataract, right eye: Secondary | ICD-10-CM | POA: Diagnosis not present

## 2020-11-26 DIAGNOSIS — N907 Vulvar cyst: Secondary | ICD-10-CM | POA: Diagnosis not present

## 2020-12-09 DIAGNOSIS — L03115 Cellulitis of right lower limb: Secondary | ICD-10-CM | POA: Diagnosis not present

## 2020-12-09 DIAGNOSIS — E559 Vitamin D deficiency, unspecified: Secondary | ICD-10-CM | POA: Diagnosis not present

## 2020-12-09 DIAGNOSIS — R609 Edema, unspecified: Secondary | ICD-10-CM | POA: Diagnosis not present

## 2020-12-17 DIAGNOSIS — N907 Vulvar cyst: Secondary | ICD-10-CM | POA: Diagnosis not present

## 2020-12-23 ENCOUNTER — Telehealth: Payer: Self-pay

## 2020-12-23 NOTE — Telephone Encounter (Signed)
Cpap data not found, LVM for pt to bring cpap to appt or call back to r/s

## 2020-12-27 ENCOUNTER — Other Ambulatory Visit: Payer: Self-pay

## 2020-12-27 ENCOUNTER — Ambulatory Visit: Payer: PPO | Admitting: Adult Health

## 2020-12-30 DIAGNOSIS — M2042 Other hammer toe(s) (acquired), left foot: Secondary | ICD-10-CM | POA: Diagnosis not present

## 2020-12-30 DIAGNOSIS — M7742 Metatarsalgia, left foot: Secondary | ICD-10-CM | POA: Diagnosis not present

## 2020-12-30 DIAGNOSIS — M79672 Pain in left foot: Secondary | ICD-10-CM | POA: Diagnosis not present

## 2020-12-30 DIAGNOSIS — M79671 Pain in right foot: Secondary | ICD-10-CM | POA: Diagnosis not present

## 2021-01-03 DIAGNOSIS — M25561 Pain in right knee: Secondary | ICD-10-CM | POA: Diagnosis not present

## 2021-01-06 ENCOUNTER — Other Ambulatory Visit: Payer: Self-pay

## 2021-01-06 ENCOUNTER — Emergency Department (HOSPITAL_COMMUNITY)
Admission: EM | Admit: 2021-01-06 | Discharge: 2021-01-07 | Disposition: A | Discharge status: Home / Self Care | Payer: PPO | Attending: Emergency Medicine | Admitting: Emergency Medicine

## 2021-01-06 ENCOUNTER — Encounter (HOSPITAL_COMMUNITY): Payer: Self-pay

## 2021-01-06 ENCOUNTER — Emergency Department (HOSPITAL_COMMUNITY): Payer: PPO

## 2021-01-06 DIAGNOSIS — N189 Chronic kidney disease, unspecified: Secondary | ICD-10-CM | POA: Insufficient documentation

## 2021-01-06 DIAGNOSIS — M25561 Pain in right knee: Secondary | ICD-10-CM | POA: Diagnosis not present

## 2021-01-06 DIAGNOSIS — Z96652 Presence of left artificial knee joint: Secondary | ICD-10-CM | POA: Insufficient documentation

## 2021-01-06 DIAGNOSIS — I129 Hypertensive chronic kidney disease with stage 1 through stage 4 chronic kidney disease, or unspecified chronic kidney disease: Secondary | ICD-10-CM | POA: Diagnosis not present

## 2021-01-06 DIAGNOSIS — R079 Chest pain, unspecified: Secondary | ICD-10-CM | POA: Diagnosis not present

## 2021-01-06 DIAGNOSIS — M79602 Pain in left arm: Secondary | ICD-10-CM | POA: Insufficient documentation

## 2021-01-06 DIAGNOSIS — Z79899 Other long term (current) drug therapy: Secondary | ICD-10-CM | POA: Insufficient documentation

## 2021-01-06 DIAGNOSIS — R110 Nausea: Secondary | ICD-10-CM | POA: Diagnosis not present

## 2021-01-06 DIAGNOSIS — I10 Essential (primary) hypertension: Secondary | ICD-10-CM | POA: Diagnosis not present

## 2021-01-06 DIAGNOSIS — I959 Hypotension, unspecified: Secondary | ICD-10-CM | POA: Diagnosis not present

## 2021-01-06 DIAGNOSIS — R072 Precordial pain: Secondary | ICD-10-CM | POA: Diagnosis not present

## 2021-01-06 DIAGNOSIS — R6884 Jaw pain: Secondary | ICD-10-CM | POA: Insufficient documentation

## 2021-01-06 DIAGNOSIS — R0789 Other chest pain: Secondary | ICD-10-CM | POA: Diagnosis not present

## 2021-01-06 DIAGNOSIS — Z85828 Personal history of other malignant neoplasm of skin: Secondary | ICD-10-CM | POA: Insufficient documentation

## 2021-01-06 DIAGNOSIS — R197 Diarrhea, unspecified: Secondary | ICD-10-CM | POA: Diagnosis not present

## 2021-01-06 LAB — BASIC METABOLIC PANEL
Anion gap: 9 (ref 5–15)
BUN: 8 mg/dL (ref 8–23)
CO2: 24 mmol/L (ref 22–32)
Calcium: 8.8 mg/dL — ABNORMAL LOW (ref 8.9–10.3)
Chloride: 96 mmol/L — ABNORMAL LOW (ref 98–111)
Creatinine, Ser: 0.8 mg/dL (ref 0.44–1.00)
GFR, Estimated: >60 mL/min (ref >60)
Glucose, Bld: 99 mg/dL (ref 70–99)
Potassium: 4 mmol/L (ref 3.5–5.1)
Sodium: 129 mmol/L — ABNORMAL LOW (ref 135–145)

## 2021-01-06 LAB — CBC
HCT: 35.7 % — ABNORMAL LOW (ref 36.0–46.0)
Hemoglobin: 12.4 g/dL (ref 12.0–15.0)
MCH: 32.9 pg (ref 26.0–34.0)
MCHC: 34.7 g/dL (ref 30.0–36.0)
MCV: 94.7 fL (ref 80.0–100.0)
Platelets: 200 10*3/uL (ref 150–400)
RBC: 3.77 MIL/uL — ABNORMAL LOW (ref 3.87–5.11)
RDW: 12.5 % (ref 11.5–15.5)
WBC: 8.2 10*3/uL (ref 4.0–10.5)
nRBC: 0 % (ref 0.0–0.2)

## 2021-01-06 LAB — CBG MONITORING, ED: Glucose-Capillary: 99 mg/dL (ref 70–99)

## 2021-01-06 LAB — TROPONIN I (HIGH SENSITIVITY)
Troponin I (High Sensitivity): 6 ng/L (ref <18)
Troponin I (High Sensitivity): 7 ng/L (ref <18)

## 2021-01-06 MED ORDER — MORPHINE SULFATE (PF) 2 MG/ML IV SOLN
2.0000 mg | Freq: Once | INTRAVENOUS | Status: AC
  Administered 2021-01-06: 2 mg via INTRAVENOUS
  Filled 2021-01-06: qty 1

## 2021-01-06 MED ORDER — KETOROLAC TROMETHAMINE 15 MG/ML IJ SOLN
15.0000 mg | Freq: Once | INTRAMUSCULAR | Status: AC
  Administered 2021-01-06: 15 mg via INTRAVENOUS
  Filled 2021-01-06: qty 1

## 2021-01-06 MED ORDER — ALUM & MAG HYDROXIDE-SIMETH 200-200-20 MG/5ML PO SUSP
30.0000 mL | Freq: Once | ORAL | Status: DC
  Filled 2021-01-06: qty 30

## 2021-01-06 MED ORDER — SODIUM CHLORIDE 0.9 % IV BOLUS
1000.0000 mL | Freq: Once | INTRAVENOUS | Status: AC
  Administered 2021-01-06: 1000 mL via INTRAVENOUS

## 2021-01-06 MED ORDER — ASPIRIN 81 MG PO CHEW
324.0000 mg | CHEWABLE_TABLET | Freq: Once | ORAL | Status: AC
  Administered 2021-01-06: 324 mg via ORAL
  Filled 2021-01-06: qty 4

## 2021-01-06 MED ORDER — DICLOFENAC SODIUM 1 % EX GEL: 4.0000 g | Freq: Four times a day (QID) | CUTANEOUS | 0 refills | Status: DC

## 2021-01-06 NOTE — ED Provider Notes (Signed)
[facility] EMERGENCY DEPARTMENT Provider Note   CSN: [id number] Arrival date & time: 01/06/21  1759     History Chief Complaint  Patient presents with   Chest Pain    [name] is a 80 y.o. female.  80 yo F with a chief complaints of chest pain.  This is been coming and going.  Started about 1230 this afternoon.  She had just had something to eat felt nauseated and went upstairs to lay down and then had "explosive diarrhea "she felt a little bit better and wanted to try and drive home.  Felt like she was having the chest pain come and go.  Radiate up into her jaw and then down the left arm.  Denied any significant exertional symptoms.  Denies cough congestion or fever.  Feels like her pain got worse when they were starting her IV in the EMS truck.  She denies history of MI has a history of hypertension hyperlipidemia denies diabetes smoking.  Father had an MI in his 71s.  She denies history of PE or DVT denies hemoptysis denies unilateral lower extremity edema denies recent surgery mobilization or hospitalization denies estrogen use denies history of cancer.  The history is provided by the patient.  Chest Pain Pain location:  Substernal area Pain quality: aching and pressure   Pain radiates to:  L jaw and L arm Pain severity:  Moderate Onset quality:  Gradual Duration:  6 days Timing:  Intermittent Progression:  Worsening Chronicity:  New Relieved by:  Nothing Worsened by:  Nothing Ineffective treatments:  None tried Associated symptoms: no dizziness, no fever, no headache, no nausea, no palpitations, no shortness of breath and no vomiting       Past Medical History:  Diagnosis Date   BCC (basal cell carcinoma of skin) 03/27/2006   Above Left Upper Lip (MOH's)   Benign essential tremor    Chronic kidney disease    Essential hypertension, benign    Fibromyalgia    GERD (gastroesophageal reflux disease)    Heart murmur    Hemorrhoids     Hyponatremia    secondary to hctz-recurrent March 2010   Low back pain    facet joint pain   Occipital neuralgia    Osteoarthritis    Osteopenia    Pure hypercholesterolemia 12/16/2013   SCCA (squamous cell carcinoma) of skin 07/15/2002   Left Inner Cheek(in situ)   SCCA (squamous cell carcinoma) of skin 05/06/2004   Right Wing Nose(in situ) -Cx3,5FU   SCCA (squamous cell carcinoma) of skin 01/09/2011   Above Right Upper Lip(in situ)-Cx3   SCCA (squamous cell carcinoma) of skin 01/05/2014   Left Temple(in situ)-Cx3,5FU   SCCA (squamous cell carcinoma) of skin 09/30/2014   Right Wrist(in situ)-tx p bx   Shortness of breath dyspnea    Superficial basal cell carcinoma (BCC) 08/17/1982   Right Side Upper Lip   Tuberculosis    as a child    Patient Active Problem List   Diagnosis Date Noted   OSA (obstructive sleep apnea) 08/23/2020   History of snoring 07/27/2020   Dysphonia of essential tremor 06/21/2020   Disabling essential tremor 06/21/2020   Chronic insomnia 06/21/2020   Status post total knee replacement, left 04/13/2017   Vitamin D deficiency 04/12/2017   History of chronic kidney disease 04/12/2017   Osteopenia of multiple sites 04/06/2017   Fibromyalgia syndrome 09/11/2016   Other fatigue 09/11/2016   Primary insomnia 09/11/2016   History of osteopenia 09/11/2016  OA (osteoarthritis) of knee 11/08/2015   Essential hypertension, benign 12/16/2013   Pure hypercholesterolemia 12/16/2013    Past Surgical History:  Procedure Laterality Date   TOTAL KNEE ARTHROPLASTY Left 11/08/2015   Procedure: LEFT TOTAL KNEE ARTHROPLASTY;  Surgeon: Gaynelle Arabian, MD;  Location: [facility];  Service: Orthopedics;  Laterality: Left;   TUBAL LIGATION       OB History   No obstetric history on file.     Family History  Problem Relation Age of Onset   Heart disease Father    Prostate cancer Father    Heart disease Mother    Cancer - Ovarian Mother    Congestive Heart Failure  Mother    Stroke Sister     Social History   Tobacco Use   Smoking status: Never   Smokeless tobacco: Never  Vaping Use   Vaping Use: Never used  Substance Use Topics   Alcohol use: Yes    Alcohol/week: 0.0 standard drinks   Drug use: No    Home Medications Prior to Admission medications   Medication Sig Start Date End Date Taking? Authorizing Provider  amLODipine (NORVASC) 5 MG tablet TAKE ONE TABLET BY MOUTH DAILY Patient taking differently: Take 5 mg by mouth daily. 05/14/15  Yes Jettie Booze, MD  atenolol (TENORMIN) 25 MG tablet TAKE ONE TABLET BY MOUTH DAILY Patient taking differently: Take 25 mg by mouth daily. 07/12/16  Yes Jettie Booze, MD  diclofenac Sodium (VOLTAREN) 1 % GEL Apply 4 g topically 4 (four) times daily. 01/06/21  Yes Deno Etienne, DO  pregabalin (LYRICA) 75 MG capsule Take 75 mg by mouth daily.   Yes [provider]  primidone (MYSOLINE) 50 MG tablet 0.5 tab at night time, increase to bid after 30 days. Patient taking differently: Take 25 mg by mouth 2 (two) times daily. 08/23/20  Yes Dohmeier, Asencion Partridge, MD  SALINE NA Place 1 spray into both nostrils every 2 (two) hours as needed (dryness).    Yes [provider]  simvastatin (ZOCOR) 40 MG tablet Take 1 tablet (40 mg total) by mouth at bedtime. 08/26/15  Yes Jettie Booze, MD  traMADol (ULTRAM) 50 MG tablet TAKE 2 TABLETS BY MOUTH 3 TIMES A DAY AS NEEDED Patient taking differently: Take 100 mg by mouth 2 (two) times daily as needed for severe pain or moderate pain. 05/28/17  Yes Ofilia Neas, PA-C  traZODone (DESYREL) 50 MG tablet Take 1 tablet (50 mg total) by mouth at bedtime. 08/23/20  Yes Dohmeier, Asencion Partridge, MD    Allergies    Hctz [hydrochlorothiazide], Lisinopril, and Sulfa antibiotics  Review of Systems   Review of Systems  Constitutional:  Negative for chills and fever.  HENT:  Negative for congestion and rhinorrhea.   Eyes:  Negative for redness and visual  disturbance.  Respiratory:  Negative for shortness of breath and wheezing.   Cardiovascular:  Positive for chest pain. Negative for palpitations.  Gastrointestinal:  Negative for nausea and vomiting.  Genitourinary:  Negative for dysuria and urgency.  Musculoskeletal:  Negative for arthralgias and myalgias.  Skin:  Negative for pallor and wound.  Neurological:  Negative for dizziness and headaches.   Physical Exam Updated Vital Signs BP (!) 145/60 (BP Location: Right Arm)   Pulse 66   Temp (!) 97.3 F (36.3 C) (Oral)   Resp 16   Ht 5\' 7"  (1.702 m)   Wt 70.8 kg   SpO2 97%   BMI 24.43 kg/m   Physical  Exam Vitals and nursing note reviewed.  Constitutional:      General: She is not in acute distress.    Appearance: She is well-developed. She is not diaphoretic.  HENT:     Head: Normocephalic and atraumatic.  Eyes:     Pupils: Pupils are equal, round, and reactive to light.  Cardiovascular:     Rate and Rhythm: Normal rate and regular rhythm.     Heart sounds: No murmur heard.   No friction rub. No gallop.  Pulmonary:     Effort: Pulmonary effort is normal.     Breath sounds: No wheezing or rales.  Chest:     Chest wall: Tenderness present.     Comments: Tenderness along the patient's anterior chest reproduces her symptoms. Abdominal:     General: There is no distension.     Palpations: Abdomen is soft.     Tenderness: There is no abdominal tenderness.  Musculoskeletal:        General: No tenderness.     Cervical back: Normal range of motion and neck supple.  Skin:    General: Skin is warm and dry.  Neurological:     Mental Status: She is alert and oriented to person, place, and time.  Psychiatric:        Behavior: Behavior normal.    ED Results / Procedures / Treatments   Labs (all labs ordered are listed, but only abnormal results are displayed) Labs Reviewed  CBC - Abnormal; Notable for the following components:      Result Value   RBC 3.77 (*)    HCT 35.7  (*)    All other components within normal limits  BASIC METABOLIC PANEL - Abnormal; Notable for the following components:   Sodium 129 (*)    Chloride 96 (*)    Calcium 8.8 (*)    All other components within normal limits  CBG MONITORING, ED  TROPONIN I (HIGH SENSITIVITY)  TROPONIN I (HIGH SENSITIVITY)    EKG EKG Interpretation  Date/Time:  Thursday January 06 2021 21:29:36 EDT Ventricular Rate:  64 PR Interval:  165 QRS Duration: 94 QT Interval:  431 QTC Calculation: 445 R Axis:   47 Text Interpretation: Sinus rhythm RSR' in V1 or V2, right VCD or RVH downsloping st in lead III Otherwise no significant change Confirmed by Deno Etienne [phone]) on 01/06/2021 9:39:40 PM Also confirmed by Deno Etienne [phone]), editor Hattie Perch ([id number])  on 01/07/2021 7:29:25 AM  Radiology DG Chest Portable 1 View  Result Date: 01/06/2021 CLINICAL DATA:  Chest pain EXAM: PORTABLE CHEST 1 VIEW COMPARISON:  05/05/2020 FINDINGS: Heart and mediastinal contours are within normal limits. No focal opacities or effusions. No acute bony abnormality. IMPRESSION: No active disease. Electronically Signed   By: Rolm Baptise M.D.   On: 01/06/2021 18:55    Procedures Procedures   Medications Ordered in ED Medications  aspirin chewable tablet 324 mg (324 mg Oral Given 01/06/21 1959)  morphine 2 MG/ML injection 2 mg (2 mg Intravenous Given 01/06/21 2130)  ketorolac (TORADOL) 15 MG/ML injection 15 mg (15 mg Intravenous Given 01/06/21 2230)  sodium chloride 0.9 % bolus 1,000 mL (0 mLs Intravenous Stopped 01/07/21 0006)    ED Course  I have reviewed the triage vital signs and the nursing notes.  Pertinent labs & imaging results that were available during my care of the patient were reviewed by me and considered in my medical decision making (see chart for details).    MDM Rules/Calculators/A&P  80 yo F with a cc of chest pain. Atypical in nature reproduced on exam.  Will obtain a delta  troponin.  EKG without any changes from baseline.  Initial trop negative, no significant anemia.  CXR viewed by me without ptx, focal infiltrate.  Mild hyponatremia.   Awaiting second troponin.  Second trop negative.  Feel unlikely to be acs.  Completely atypical of PE.  D/c home.  Treat as muscular.   :  I have discussed the diagnosis/risks/treatment options with the patient and family and believe the pt to be eligible for discharge home to follow-up with PCP. We also discussed returning to the ED immediately if new or worsening sx occur. We discussed the sx which are most concerning (e.g., sudden worsening pain, fever, inability to tolerate by mouth ) that necessitate immediate return. Medications administered to the patient during their visit and any new prescriptions provided to the patient are listed below.  Medications given during this visit Medications  aspirin chewable tablet 324 mg (324 mg Oral Given 01/06/21 1959)  morphine 2 MG/ML injection 2 mg (2 mg Intravenous Given 01/06/21 2130)  ketorolac (TORADOL) 15 MG/ML injection 15 mg (15 mg Intravenous Given 01/06/21 2230)  sodium chloride 0.9 % bolus 1,000 mL (0 mLs Intravenous Stopped 01/07/21 0006)     The patient appears reasonably screen and/or stabilized for discharge and I doubt any other medical condition or other [facility] requiring further screening, evaluation, or treatment in the ED at this time prior to discharge.      Final Clinical Impression(s) / ED Diagnoses Final diagnoses:  Nonspecific chest pain    Rx / DC Orders ED Discharge Orders          Ordered    diclofenac Sodium (VOLTAREN) 1 % GEL  4 times daily        01/06/21 2353             Deno Etienne, DO 01/07/21 1458

## 2021-01-06 NOTE — ED Triage Notes (Signed)
1230 this afternoon started with generalized CP radiating into her jaw and into her left arm sharp then felt and still feels heavy nothing makes worse or better.

## 2021-01-06 NOTE — Discharge Instructions (Addendum)
Your Troponins were both negative.  That makes it unlikely that this chest pain is caused by your heart.  Please follow-up with your family doctor in the office.  As we discussed when you have pain that is worse with twisting or palpation then most likely is caused by the muscles of your chest wall.  This can be a bit frustrating because it is highly likely to get rid of it is significantly with strained a muscle in your arm.  Please return for worsening pain or if you realize you are getting pain with exertion.  Use the gel as prescribed.  Follow up with your family doc. Also take tylenol 1000mg (2 extra strength) four times a day.

## 2021-01-21 ENCOUNTER — Emergency Department (HOSPITAL_COMMUNITY): Payer: PPO

## 2021-01-21 ENCOUNTER — Inpatient Hospital Stay (HOSPITAL_COMMUNITY)
Admission: EM | Admit: 2021-01-21 | Discharge: 2021-01-27 | DRG: 092 | Disposition: A | Discharge status: Home / Self Care | Payer: PPO | Attending: Family Medicine | Admitting: Family Medicine

## 2021-01-21 DIAGNOSIS — G9341 Metabolic encephalopathy: Secondary | ICD-10-CM

## 2021-01-21 DIAGNOSIS — I499 Cardiac arrhythmia, unspecified: Secondary | ICD-10-CM | POA: Diagnosis not present

## 2021-01-21 DIAGNOSIS — E861 Hypovolemia: Secondary | ICD-10-CM | POA: Diagnosis present

## 2021-01-21 DIAGNOSIS — N189 Chronic kidney disease, unspecified: Secondary | ICD-10-CM | POA: Diagnosis present

## 2021-01-21 DIAGNOSIS — K4090 Unilateral inguinal hernia, without obstruction or gangrene, not specified as recurrent: Secondary | ICD-10-CM | POA: Diagnosis not present

## 2021-01-21 DIAGNOSIS — E872 Acidosis: Secondary | ICD-10-CM | POA: Diagnosis present

## 2021-01-21 DIAGNOSIS — R410 Disorientation, unspecified: Secondary | ICD-10-CM | POA: Diagnosis not present

## 2021-01-21 DIAGNOSIS — I129 Hypertensive chronic kidney disease with stage 1 through stage 4 chronic kidney disease, or unspecified chronic kidney disease: Secondary | ICD-10-CM | POA: Diagnosis present

## 2021-01-21 DIAGNOSIS — G928 Other toxic encephalopathy: Principal | ICD-10-CM | POA: Diagnosis present

## 2021-01-21 DIAGNOSIS — M1710 Unilateral primary osteoarthritis, unspecified knee: Secondary | ICD-10-CM | POA: Diagnosis not present

## 2021-01-21 DIAGNOSIS — E871 Hypo-osmolality and hyponatremia: Secondary | ICD-10-CM | POA: Diagnosis present

## 2021-01-21 DIAGNOSIS — L89329 Pressure ulcer of left buttock, unspecified stage: Secondary | ICD-10-CM | POA: Diagnosis present

## 2021-01-21 DIAGNOSIS — E876 Hypokalemia: Secondary | ICD-10-CM | POA: Diagnosis present

## 2021-01-21 DIAGNOSIS — F09 Unspecified mental disorder due to known physiological condition: Secondary | ICD-10-CM | POA: Diagnosis present

## 2021-01-21 DIAGNOSIS — I4891 Unspecified atrial fibrillation: Secondary | ICD-10-CM | POA: Diagnosis not present

## 2021-01-21 DIAGNOSIS — M797 Fibromyalgia: Secondary | ICD-10-CM | POA: Diagnosis present

## 2021-01-21 DIAGNOSIS — I700 Atherosclerosis of aorta: Secondary | ICD-10-CM | POA: Diagnosis not present

## 2021-01-21 DIAGNOSIS — D72829 Elevated white blood cell count, unspecified: Secondary | ICD-10-CM | POA: Diagnosis not present

## 2021-01-21 DIAGNOSIS — I10 Essential (primary) hypertension: Secondary | ICD-10-CM | POA: Diagnosis not present

## 2021-01-21 DIAGNOSIS — E860 Dehydration: Secondary | ICD-10-CM | POA: Diagnosis present

## 2021-01-21 DIAGNOSIS — G4733 Obstructive sleep apnea (adult) (pediatric): Secondary | ICD-10-CM | POA: Diagnosis present

## 2021-01-21 DIAGNOSIS — Z79899 Other long term (current) drug therapy: Secondary | ICD-10-CM

## 2021-01-21 DIAGNOSIS — R251 Tremor, unspecified: Secondary | ICD-10-CM | POA: Diagnosis not present

## 2021-01-21 DIAGNOSIS — R4020 Unspecified coma: Secondary | ICD-10-CM | POA: Diagnosis not present

## 2021-01-21 DIAGNOSIS — Z96652 Presence of left artificial knee joint: Secondary | ICD-10-CM | POA: Diagnosis present

## 2021-01-21 DIAGNOSIS — R197 Diarrhea, unspecified: Secondary | ICD-10-CM | POA: Diagnosis not present

## 2021-01-21 DIAGNOSIS — E559 Vitamin D deficiency, unspecified: Secondary | ICD-10-CM | POA: Diagnosis not present

## 2021-01-21 DIAGNOSIS — R0689 Other abnormalities of breathing: Secondary | ICD-10-CM | POA: Diagnosis not present

## 2021-01-21 DIAGNOSIS — E7800 Pure hypercholesterolemia, unspecified: Secondary | ICD-10-CM | POA: Diagnosis present

## 2021-01-21 DIAGNOSIS — E8729 Other acidosis: Secondary | ICD-10-CM

## 2021-01-21 DIAGNOSIS — Z8042 Family history of malignant neoplasm of prostate: Secondary | ICD-10-CM

## 2021-01-21 DIAGNOSIS — R404 Transient alteration of awareness: Secondary | ICD-10-CM | POA: Diagnosis not present

## 2021-01-21 DIAGNOSIS — G250 Essential tremor: Secondary | ICD-10-CM | POA: Diagnosis present

## 2021-01-21 DIAGNOSIS — Z882 Allergy status to sulfonamides status: Secondary | ICD-10-CM

## 2021-01-21 DIAGNOSIS — R4182 Altered mental status, unspecified: Secondary | ICD-10-CM | POA: Diagnosis not present

## 2021-01-21 DIAGNOSIS — Z823 Family history of stroke: Secondary | ICD-10-CM

## 2021-01-21 DIAGNOSIS — K529 Noninfective gastroenteritis and colitis, unspecified: Secondary | ICD-10-CM | POA: Diagnosis present

## 2021-01-21 DIAGNOSIS — D696 Thrombocytopenia, unspecified: Secondary | ICD-10-CM | POA: Diagnosis present

## 2021-01-21 DIAGNOSIS — L8990 Pressure ulcer of unspecified site, unspecified stage: Secondary | ICD-10-CM | POA: Insufficient documentation

## 2021-01-21 DIAGNOSIS — R6510 Systemic inflammatory response syndrome (SIRS) of non-infectious origin without acute organ dysfunction: Secondary | ICD-10-CM | POA: Diagnosis present

## 2021-01-21 DIAGNOSIS — G3184 Mild cognitive impairment, so stated: Secondary | ICD-10-CM | POA: Diagnosis not present

## 2021-01-21 DIAGNOSIS — N179 Acute kidney failure, unspecified: Secondary | ICD-10-CM | POA: Diagnosis present

## 2021-01-21 DIAGNOSIS — Z87448 Personal history of other diseases of urinary system: Secondary | ICD-10-CM | POA: Diagnosis not present

## 2021-01-21 DIAGNOSIS — K219 Gastro-esophageal reflux disease without esophagitis: Secondary | ICD-10-CM | POA: Diagnosis present

## 2021-01-21 DIAGNOSIS — Z20822 Contact with and (suspected) exposure to covid-19: Secondary | ICD-10-CM | POA: Diagnosis present

## 2021-01-21 DIAGNOSIS — Z888 Allergy status to other drugs, medicaments and biological substances status: Secondary | ICD-10-CM

## 2021-01-21 DIAGNOSIS — L89319 Pressure ulcer of right buttock, unspecified stage: Secondary | ICD-10-CM | POA: Diagnosis present

## 2021-01-21 DIAGNOSIS — R778 Other specified abnormalities of plasma proteins: Secondary | ICD-10-CM | POA: Diagnosis not present

## 2021-01-21 DIAGNOSIS — Z85828 Personal history of other malignant neoplasm of skin: Secondary | ICD-10-CM | POA: Diagnosis not present

## 2021-01-21 DIAGNOSIS — F5104 Psychophysiologic insomnia: Secondary | ICD-10-CM | POA: Diagnosis not present

## 2021-01-21 DIAGNOSIS — Z8249 Family history of ischemic heart disease and other diseases of the circulatory system: Secondary | ICD-10-CM

## 2021-01-21 LAB — COMPREHENSIVE METABOLIC PANEL
ALT: 21 U/L (ref 0–44)
AST: 38 U/L (ref 15–41)
Albumin: 4.8 g/dL (ref 3.5–5.0)
Alkaline Phosphatase: 100 U/L (ref 38–126)
Anion gap: 24 — ABNORMAL HIGH (ref 5–15)
BUN: 32 mg/dL — ABNORMAL HIGH (ref 8–23)
CO2: 13 mmol/L — ABNORMAL LOW (ref 22–32)
Calcium: 10.5 mg/dL — ABNORMAL HIGH (ref 8.9–10.3)
Chloride: 97 mmol/L — ABNORMAL LOW (ref 98–111)
Creatinine, Ser: 1.96 mg/dL — ABNORMAL HIGH (ref 0.44–1.00)
GFR, Estimated: 25 mL/min — ABNORMAL LOW (ref >60)
Glucose, Bld: 153 mg/dL — ABNORMAL HIGH (ref 70–99)
Potassium: 3.9 mmol/L (ref 3.5–5.1)
Sodium: 134 mmol/L — ABNORMAL LOW (ref 135–145)
Total Bilirubin: 1.7 mg/dL — ABNORMAL HIGH (ref 0.3–1.2)
Total Protein: 8.3 g/dL — ABNORMAL HIGH (ref 6.5–8.1)

## 2021-01-21 LAB — CBC WITH DIFFERENTIAL/PLATELET
Abs Immature Granulocytes: 0.13 10*3/uL — ABNORMAL HIGH (ref 0.00–0.07)
Basophils Absolute: 0 10*3/uL (ref 0.0–0.1)
Basophils Relative: 0 %
Eosinophils Absolute: 0 10*3/uL (ref 0.0–0.5)
Eosinophils Relative: 0 %
HCT: 46.8 % — ABNORMAL HIGH (ref 36.0–46.0)
Hemoglobin: 16.5 g/dL — ABNORMAL HIGH (ref 12.0–15.0)
Immature Granulocytes: 1 %
Lymphocytes Relative: 6 %
Lymphs Abs: 1 10*3/uL (ref 0.7–4.0)
MCH: 33.3 pg (ref 26.0–34.0)
MCHC: 35.3 g/dL (ref 30.0–36.0)
MCV: 94.4 fL (ref 80.0–100.0)
Monocytes Absolute: 1.4 10*3/uL — ABNORMAL HIGH (ref 0.1–1.0)
Monocytes Relative: 8 %
Neutro Abs: 14.8 10*3/uL — ABNORMAL HIGH (ref 1.7–7.7)
Neutrophils Relative %: 85 %
Platelets: 318 10*3/uL (ref 150–400)
RBC: 4.96 MIL/uL (ref 3.87–5.11)
RDW: 12.9 % (ref 11.5–15.5)
WBC: 17.3 10*3/uL — ABNORMAL HIGH (ref 4.0–10.5)
nRBC: 0 % (ref 0.0–0.2)

## 2021-01-21 LAB — TROPONIN I (HIGH SENSITIVITY)
Troponin I (High Sensitivity): 231 ng/L
Troponin I (High Sensitivity): 246 ng/L

## 2021-01-21 LAB — AMMONIA: Ammonia: 19 umol/L (ref 9–35)

## 2021-01-21 MED ORDER — SODIUM CHLORIDE 0.9 % IV BOLUS
1000.0000 mL | Freq: Once | INTRAVENOUS | Status: AC
  Administered 2021-01-21: 1000 mL via INTRAVENOUS

## 2021-01-21 MED ORDER — HALOPERIDOL LACTATE 5 MG/ML IJ SOLN
5.0000 mg | Freq: Once | INTRAMUSCULAR | Status: AC
  Administered 2021-01-21: 5 mg via INTRAMUSCULAR
  Filled 2021-01-21: qty 1

## 2021-01-21 MED ORDER — HEPARIN (PORCINE) 25000 UT/250ML-% IV SOLN
850.0000 [IU]/h | INTRAVENOUS | Status: DC
  Administered 2021-01-22: 850 [IU]/h via INTRAVENOUS
  Filled 2021-01-21: qty 250

## 2021-01-21 MED ORDER — HEPARIN BOLUS VIA INFUSION
4000.0000 [IU] | Freq: Once | INTRAVENOUS | Status: AC
  Administered 2021-01-22: 4000 [IU] via INTRAVENOUS
  Filled 2021-01-21: qty 4000

## 2021-01-21 MED ORDER — METOPROLOL TARTRATE 5 MG/5ML IV SOLN: 5.0000 mg | Freq: Once | INTRAVENOUS | Status: DC

## 2021-01-21 MED ORDER — SODIUM CHLORIDE 0.9 % IV SOLN
1.0000 g | Freq: Once | INTRAVENOUS | Status: DC
  Administered 2021-01-22: 1 g via INTRAVENOUS
  Filled 2021-01-21: qty 10

## 2021-01-21 MED ORDER — LORAZEPAM 2 MG/ML IJ SOLN
2.0000 mg | Freq: Once | INTRAMUSCULAR | Status: AC
  Administered 2021-01-21: 2 mg via INTRAMUSCULAR
  Filled 2021-01-21: qty 1

## 2021-01-21 MED ORDER — METOPROLOL TARTRATE 5 MG/5ML IV SOLN: 5.0000 mg | Freq: Once | INTRAVENOUS | Status: DC | PRN

## 2021-01-21 NOTE — ED Provider Notes (Signed)
Care assumed from Dr. Langston Masker, patient with altered mental status, atrial fibrillation with rapid ventricular response, elevated troponin pending second troponin.  Also noted to have acute kidney injury, high anion gap metabolic acidosis, cause unclear.  Will check salicylate level.  Also, on reviewing ECG, it appears that she is in a multifocal atrial tachycardia and not atrial fibrillation.  Repeat troponin has increased slightly.  However, with the constellation of findings, this is felt to be demand ischemia and not ACS.  I have discussed this with Dr. Humphrey Rolls of cardiology service, who agrees that patient should be admitted to medicine service.  He states cardiology can be consulted if needed.  Case is discussed with Dr. Nevada Crane of [facility], who agrees to admit the patient.   Delora Fuel, MD 25/75/05 [phone]
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
## 2021-01-21 NOTE — ED Triage Notes (Signed)
Pt arrived via [facility] from home. Per EMS, pt's daughter called for EMS because pt was found on the commode, nonverbal and not following commands in home with an unknown timeframe. Per EMS pt's baseline reported by daughter is caox4, lives at home alone. LKW was Wed night (7/13) via video chat with daughter.   VS-  BP 170/80  HR 135-170 ST with frequent PAC (hx afib) SpO2 99% RA  CBG 162

## 2021-01-22 ENCOUNTER — Inpatient Hospital Stay (HOSPITAL_COMMUNITY): Payer: PPO

## 2021-01-22 ENCOUNTER — Encounter (HOSPITAL_COMMUNITY): Payer: Self-pay | Admitting: Internal Medicine

## 2021-01-22 DIAGNOSIS — G250 Essential tremor: Secondary | ICD-10-CM | POA: Diagnosis present

## 2021-01-22 DIAGNOSIS — R4182 Altered mental status, unspecified: Secondary | ICD-10-CM | POA: Diagnosis present

## 2021-01-22 DIAGNOSIS — K219 Gastro-esophageal reflux disease without esophagitis: Secondary | ICD-10-CM | POA: Diagnosis present

## 2021-01-22 DIAGNOSIS — D72829 Elevated white blood cell count, unspecified: Secondary | ICD-10-CM | POA: Diagnosis not present

## 2021-01-22 DIAGNOSIS — R778 Other specified abnormalities of plasma proteins: Secondary | ICD-10-CM

## 2021-01-22 DIAGNOSIS — Z20822 Contact with and (suspected) exposure to covid-19: Secondary | ICD-10-CM | POA: Diagnosis present

## 2021-01-22 DIAGNOSIS — E860 Dehydration: Secondary | ICD-10-CM | POA: Diagnosis present

## 2021-01-22 DIAGNOSIS — M797 Fibromyalgia: Secondary | ICD-10-CM | POA: Diagnosis present

## 2021-01-22 DIAGNOSIS — I10 Essential (primary) hypertension: Secondary | ICD-10-CM | POA: Diagnosis not present

## 2021-01-22 DIAGNOSIS — E861 Hypovolemia: Secondary | ICD-10-CM | POA: Diagnosis present

## 2021-01-22 DIAGNOSIS — N179 Acute kidney failure, unspecified: Secondary | ICD-10-CM | POA: Diagnosis present

## 2021-01-22 DIAGNOSIS — L89319 Pressure ulcer of right buttock, unspecified stage: Secondary | ICD-10-CM | POA: Diagnosis present

## 2021-01-22 DIAGNOSIS — R197 Diarrhea, unspecified: Secondary | ICD-10-CM | POA: Diagnosis not present

## 2021-01-22 DIAGNOSIS — K529 Noninfective gastroenteritis and colitis, unspecified: Secondary | ICD-10-CM | POA: Diagnosis present

## 2021-01-22 DIAGNOSIS — R410 Disorientation, unspecified: Secondary | ICD-10-CM | POA: Diagnosis not present

## 2021-01-22 DIAGNOSIS — N189 Chronic kidney disease, unspecified: Secondary | ICD-10-CM | POA: Diagnosis present

## 2021-01-22 DIAGNOSIS — R6510 Systemic inflammatory response syndrome (SIRS) of non-infectious origin without acute organ dysfunction: Secondary | ICD-10-CM | POA: Diagnosis present

## 2021-01-22 DIAGNOSIS — G3184 Mild cognitive impairment, so stated: Secondary | ICD-10-CM | POA: Diagnosis not present

## 2021-01-22 DIAGNOSIS — F09 Unspecified mental disorder due to known physiological condition: Secondary | ICD-10-CM | POA: Diagnosis present

## 2021-01-22 DIAGNOSIS — D696 Thrombocytopenia, unspecified: Secondary | ICD-10-CM | POA: Diagnosis present

## 2021-01-22 DIAGNOSIS — G4733 Obstructive sleep apnea (adult) (pediatric): Secondary | ICD-10-CM | POA: Diagnosis present

## 2021-01-22 DIAGNOSIS — L89329 Pressure ulcer of left buttock, unspecified stage: Secondary | ICD-10-CM | POA: Diagnosis present

## 2021-01-22 DIAGNOSIS — E871 Hypo-osmolality and hyponatremia: Secondary | ICD-10-CM | POA: Diagnosis present

## 2021-01-22 DIAGNOSIS — Z85828 Personal history of other malignant neoplasm of skin: Secondary | ICD-10-CM | POA: Diagnosis not present

## 2021-01-22 DIAGNOSIS — E7800 Pure hypercholesterolemia, unspecified: Secondary | ICD-10-CM | POA: Diagnosis present

## 2021-01-22 DIAGNOSIS — E872 Acidosis: Secondary | ICD-10-CM | POA: Diagnosis present

## 2021-01-22 DIAGNOSIS — G928 Other toxic encephalopathy: Secondary | ICD-10-CM | POA: Diagnosis present

## 2021-01-22 DIAGNOSIS — E876 Hypokalemia: Secondary | ICD-10-CM | POA: Diagnosis present

## 2021-01-22 DIAGNOSIS — I129 Hypertensive chronic kidney disease with stage 1 through stage 4 chronic kidney disease, or unspecified chronic kidney disease: Secondary | ICD-10-CM | POA: Diagnosis present

## 2021-01-22 LAB — CBC
HCT: 37.6 % (ref 36.0–46.0)
Hemoglobin: 13.3 g/dL (ref 12.0–15.0)
MCH: 33.3 pg (ref 26.0–34.0)
MCHC: 35.4 g/dL (ref 30.0–36.0)
MCV: 94 fL (ref 80.0–100.0)
Platelets: 202 10*3/uL (ref 150–400)
RBC: 4 MIL/uL (ref 3.87–5.11)
RDW: 13.1 % (ref 11.5–15.5)
WBC: 12.6 10*3/uL — ABNORMAL HIGH (ref 4.0–10.5)
nRBC: 0 % (ref 0.0–0.2)

## 2021-01-22 LAB — ECHOCARDIOGRAM COMPLETE
Area-P 1/2: 2.83 cm2
S' Lateral: 3.2 cm

## 2021-01-22 LAB — COMPREHENSIVE METABOLIC PANEL
ALT: 17 U/L (ref 0–44)
AST: 42 U/L — ABNORMAL HIGH (ref 15–41)
Albumin: 3.5 g/dL (ref 3.5–5.0)
Alkaline Phosphatase: 71 U/L (ref 38–126)
Anion gap: 17 — ABNORMAL HIGH (ref 5–15)
BUN: 28 mg/dL — ABNORMAL HIGH (ref 8–23)
CO2: 16 mmol/L — ABNORMAL LOW (ref 22–32)
Calcium: 8.7 mg/dL — ABNORMAL LOW (ref 8.9–10.3)
Chloride: 100 mmol/L (ref 98–111)
Creatinine, Ser: 1.37 mg/dL — ABNORMAL HIGH (ref 0.44–1.00)
GFR, Estimated: 39 mL/min — ABNORMAL LOW (ref >60)
Glucose, Bld: 112 mg/dL — ABNORMAL HIGH (ref 70–99)
Potassium: 3.4 mmol/L — ABNORMAL LOW (ref 3.5–5.1)
Sodium: 133 mmol/L — ABNORMAL LOW (ref 135–145)
Total Bilirubin: 1.1 mg/dL (ref 0.3–1.2)
Total Protein: 6.3 g/dL — ABNORMAL LOW (ref 6.5–8.1)

## 2021-01-22 LAB — I-STAT VENOUS BLOOD GAS, ED
Acid-base deficit: 7 mmol/L — ABNORMAL HIGH (ref 0.0–2.0)
Bicarbonate: 16.1 mmol/L — ABNORMAL LOW (ref 20.0–28.0)
Calcium, Ion: 1.11 mmol/L — ABNORMAL LOW (ref 1.15–1.40)
HCT: 38 % (ref 36.0–46.0)
Hemoglobin: 12.9 g/dL (ref 12.0–15.0)
O2 Saturation: 85 %
Potassium: 3.4 mmol/L — ABNORMAL LOW (ref 3.5–5.1)
Sodium: 136 mmol/L (ref 135–145)
TCO2: 17 mmol/L — ABNORMAL LOW (ref 22–32)
pCO2, Ven: 26 mmHg — ABNORMAL LOW (ref 44.0–60.0)
pH, Ven: 7.4 (ref 7.250–7.430)
pO2, Ven: 49 mmHg — ABNORMAL HIGH (ref 32.0–45.0)

## 2021-01-22 LAB — RAPID URINE DRUG SCREEN, HOSP PERFORMED
Amphetamines: NOT DETECTED
Barbiturates: POSITIVE — AB
Benzodiazepines: NOT DETECTED
Cocaine: NOT DETECTED
Opiates: NOT DETECTED
Tetrahydrocannabinol: NOT DETECTED

## 2021-01-22 LAB — LACTIC ACID, PLASMA: Lactic Acid, Venous: 1.7 mmol/L (ref 0.5–1.9)

## 2021-01-22 LAB — RESP PANEL BY RT-PCR (FLU A&B, COVID) ARPGX2
Influenza A by PCR: NEGATIVE
Influenza B by PCR: NEGATIVE
SARS Coronavirus 2 by RT PCR: NEGATIVE

## 2021-01-22 LAB — URINALYSIS, ROUTINE W REFLEX MICROSCOPIC
Bilirubin Urine: NEGATIVE
Glucose, UA: NEGATIVE mg/dL
Ketones, ur: 80 mg/dL — AB
Leukocytes,Ua: NEGATIVE
Nitrite: NEGATIVE
Protein, ur: 100 mg/dL — AB
Specific Gravity, Urine: 1.02 (ref 1.005–1.030)
pH: 5 (ref 5.0–8.0)

## 2021-01-22 LAB — CREATININE, URINE, RANDOM: Creatinine, Urine: 208.54 mg/dL

## 2021-01-22 LAB — TSH: TSH: 0.793 u[IU]/mL (ref 0.350–4.500)

## 2021-01-22 LAB — HEPARIN LEVEL (UNFRACTIONATED): Heparin Unfractionated: 0.6 IU/mL (ref 0.30–0.70)

## 2021-01-22 LAB — PROCALCITONIN: Procalcitonin: <0.1 ng/mL

## 2021-01-22 LAB — NA AND K (SODIUM & POTASSIUM), RAND UR
Potassium Urine: 64 mmol/L
Sodium, Ur: 18 mmol/L

## 2021-01-22 LAB — SALICYLATE LEVEL: Salicylate Lvl: <7 mg/dL — ABNORMAL LOW (ref 7.0–30.0)

## 2021-01-22 LAB — MRSA NEXT GEN BY PCR, NASAL: MRSA by PCR Next Gen: NOT DETECTED

## 2021-01-22 LAB — TROPONIN I (HIGH SENSITIVITY): Troponin I (High Sensitivity): 236 ng/L (ref <18)

## 2021-01-22 LAB — MAGNESIUM: Magnesium: 1.8 mg/dL (ref 1.7–2.4)

## 2021-01-22 LAB — PHOSPHORUS: Phosphorus: 2.8 mg/dL (ref 2.5–4.6)

## 2021-01-22 MED ORDER — TRAMADOL HCL 50 MG PO TABS: 100.0000 mg | ORAL_TABLET | Freq: Two times a day (BID) | ORAL | Status: DC | PRN

## 2021-01-22 MED ORDER — PREGABALIN 75 MG PO CAPS
75.0000 mg | ORAL_CAPSULE | Freq: Every day | ORAL | Status: DC
  Administered 2021-01-22 – 2021-01-27 (×6): 75 mg via ORAL
  Filled 2021-01-22 (×6): qty 1

## 2021-01-22 MED ORDER — PRIMIDONE 50 MG PO TABS
25.0000 mg | ORAL_TABLET | Freq: Two times a day (BID) | ORAL | Status: DC
  Administered 2021-01-22 – 2021-01-27 (×11): 25 mg via ORAL
  Filled 2021-01-22 (×12): qty 0.5

## 2021-01-22 MED ORDER — SIMVASTATIN 20 MG PO TABS
40.0000 mg | ORAL_TABLET | Freq: Every day | ORAL | Status: DC
  Administered 2021-01-22 – 2021-01-26 (×5): 40 mg via ORAL
  Filled 2021-01-22 (×5): qty 2

## 2021-01-22 MED ORDER — ATENOLOL 25 MG PO TABS
25.0000 mg | ORAL_TABLET | Freq: Every day | ORAL | Status: DC
  Administered 2021-01-22 – 2021-01-26 (×5): 25 mg via ORAL
  Filled 2021-01-22 (×6): qty 1

## 2021-01-22 MED ORDER — SODIUM CHLORIDE 0.9 % IV SOLN
2.0000 g | INTRAVENOUS | Status: DC
  Filled 2021-01-22: qty 20

## 2021-01-22 MED ORDER — METOPROLOL TARTRATE 5 MG/5ML IV SOLN: 2.5000 mg | Freq: Four times a day (QID) | INTRAVENOUS | Status: AC | PRN

## 2021-01-22 MED ORDER — ONDANSETRON HCL 4 MG/2ML IJ SOLN: 4.0000 mg | Freq: Four times a day (QID) | INTRAMUSCULAR | Status: DC | PRN

## 2021-01-22 MED ORDER — STERILE WATER FOR INJECTION IV SOLN
INTRAVENOUS | Status: AC
  Filled 2021-01-22 (×2): qty 1000

## 2021-01-22 MED ORDER — ACETAMINOPHEN 325 MG PO TABS: 650.0000 mg | ORAL_TABLET | Freq: Four times a day (QID) | ORAL | Status: DC | PRN

## 2021-01-22 MED ORDER — POTASSIUM CHLORIDE CRYS ER 20 MEQ PO TBCR
40.0000 meq | EXTENDED_RELEASE_TABLET | Freq: Once | ORAL | Status: AC
  Administered 2021-01-22: 40 meq via ORAL
  Filled 2021-01-22: qty 2

## 2021-01-22 MED ORDER — TRAZODONE HCL 50 MG PO TABS
50.0000 mg | ORAL_TABLET | Freq: Every day | ORAL | Status: DC
Start: 2021-01-22 — End: 2021-01-23
  Administered 2021-01-22: 50 mg via ORAL
  Filled 2021-01-22: qty 1

## 2021-01-22 MED ORDER — AMLODIPINE BESYLATE 5 MG PO TABS
5.0000 mg | ORAL_TABLET | Freq: Every day | ORAL | Status: DC
  Administered 2021-01-22 – 2021-01-27 (×6): 5 mg via ORAL
  Filled 2021-01-22 (×7): qty 1

## 2021-01-22 NOTE — Procedures (Signed)
Patient Name: [name]  MRN: [id number]  Epilepsy Attending: Lora Havens  Referring Physician/Provider: Dr Irene Pap Date: 01/22/2021 Duration: 23.18 mins  Patient history: 80yo F with ams. EEG to evaluate for seizure  Level of alertness: Awake, asleep  AEDs during EEG study: None  Technical aspects: This EEG study was done with scalp electrodes positioned according to the 10-20 International system of electrode placement. Electrical activity was acquired at a sampling rate of 500Hz  and reviewed with a high frequency filter of 70Hz  and a low frequency filter of 1Hz . EEG data were recorded continuously and digitally stored.   Description: The posterior dominant rhythm consists of 8-9Hz  activity of moderate voltage (25-35 uV) seen predominantly in posterior head regions, symmetric and reactive to eye opening and eye closing. Sleep was characterized by vertex waves, sleep spindles (12 to 14 Hz), maximal frontocentral region. Physiologic photic driving was seen during photic stimulation.  Hyperventilation was not performed.     IMPRESSION: This study is within normal limits. No seizures or epileptiform discharges were seen throughout the recording.  Ivonna Kinnick Barbra Sarks

## 2021-01-22 NOTE — Progress Notes (Signed)
OT Cancellation Note  Patient Details Name: [name] MRN: [id number] DOB: 11/10/1940   Cancelled Treatment:    Reason Eval/Treat Not Completed: Fatigue/lethargy limiting ability to participate Pt also being set up for testing. Will follow.  Joeseph Amor OTR/L  Acute Rehab Services  [phone] office number [phone] pager number   Joeseph Amor 01/22/2021, 2:57 PM

## 2021-01-22 NOTE — Progress Notes (Signed)
EEG completed, results pending. 

## 2021-01-22 NOTE — Evaluation (Signed)
Physical Therapy Evaluation Patient Details Name: [name] MRN: [id number] DOB: 1941/04/17 Today's Date: 01/22/2021   History of Present Illness  The pt is an 80 yo female presenting 7/15 with AMS. Upon workup, pt with acute metabolic encephalopathy, elevated troponins with concern for demand ischemia, and SIRS. PMH includes: essential tremors, fibromyalgia, HTN, HLD, CKD, L TKA, and insomnia.   Clinical Impression  Pt in bed upon arrival of PT, agreeable to evaluation at this time. Prior to admission the pt was completely independent with all mobility, living alone, driving, and completing all gait and ADLs without assistance or use of DME. The pt now presents with limitations in functional mobility, strength, power, activity tolerance, and dynamic stability due to above dx, and will continue to benefit from skilled PT to address these deficits. The pt was able to complete transition to sitting EOB with minA, but remained fatigued with all OOB mobility. The pt currently requires minA with use of RW to manage short distances in the room, and will continue to benefit from skilled PT acutely as well as following return home with assist from daughter to safely progress to prior level of independence.      Follow Up Recommendations Home health PT;Supervision for mobility/OOB    Equipment Recommendations  Rolling walker with 5" wheels;3in1 (PT)    Recommendations for Other Services       Precautions / Restrictions Precautions Precautions: Fall Precaution Comments: incontinent of BM Restrictions Weight Bearing Restrictions: No      Mobility  Bed Mobility Overal bed mobility: Needs Assistance Bed Mobility: Supine to Sit;Sit to Supine     Supine to sit: Min assist Sit to supine: Supervision   General bed mobility comments: minA to complete elvation of trunk from flat bed, pt able to return to supine without assist    Transfers Overall transfer level: Needs assistance Equipment  used: 1 person hand held assist;Rolling walker (2 wheeled) Transfers: Sit to/from [facility] Sit to Stand: Mod assist;Min guard Stand pivot transfers: Min assist       General transfer comment: modA for inital power up from EOB without DME, minG with VC to push from [facility] . pt needing minA to complete pivot to and from [facility], improved mobility on return  Ambulation/Gait Ambulation/Gait assistance: Min assist Gait Distance (Feet): 3 Feet Assistive device: Rolling walker (2 wheeled) Gait Pattern/deviations: Step-through pattern;Shuffle;Trunk flexed Gait velocity: decreased Gait velocity interpretation: <1.31 ft/sec, indicative of household ambulator General Gait Details: short, shuffling steps to complete pivot to [facility] [address] then 3 ft forwards ambulation to return to bed due to pt reports of fatigue. no overt LOB. VSS       Balance Overall balance assessment: Needs assistance Sitting-balance support: No upper extremity supported;Feet supported Sitting balance-Leahy Scale: Good Sitting balance - Comments: pt able to lean outside BOS without LOB   Standing balance support: Bilateral upper extremity supported Standing balance-Leahy Scale: Poor Standing balance comment: reliant on BUE support                             Pertinent Vitals/Pain Pain Assessment: No/denies pain    Home Living Family/patient expects to be discharged to:: Private residence Living Arrangements: Alone Available Help at Discharge: Family;Available PRN/intermittently (daughter works nights as an Therapist, sports) Type of Home: House Home Access: Level entry     [place]: One [place]: Environmental consultant - 4 wheels;Cane - single point;Shower seat;Grab bars - tub/shower  Prior Function Level of Independence: Independent         Comments: pt still driving, able to complete all ADLs, IADLs, and mobility without assist or DME. has shower chair but does not use. enjoys attending live music  events, reports going to 1-2 each week in her free time     Hand Dominance   Dominant Hand: Right    Extremity/Trunk Assessment   Upper Extremity Assessment Upper Extremity Assessment: Generalized weakness    Lower Extremity Assessment Lower Extremity Assessment: Generalized weakness    Cervical / Trunk Assessment Cervical / Trunk Assessment: Normal  Communication   Communication: No difficulties  Cognition Arousal/Alertness: Lethargic Behavior During Therapy: WFL for tasks assessed/performed;Flat affect Overall Cognitive Status: Within Functional Limits for tasks assessed                                 General Comments: pt pleasant, oriented, and following all commands. flat affect with c/o significant fatigue through session      General Comments General comments (skin integrity, edema, etc.): BP remained elevated with all changes in position and activity. pt with significant redness and bruising on posterior thighs and buttocks. pt found with loose BM upon arrival of PT    Exercises     Assessment/Plan    PT Assessment Patient needs continued PT services  PT Problem List Decreased strength;Decreased activity tolerance;Decreased range of motion;Decreased balance;Decreased mobility;Decreased safety awareness       PT Treatment Interventions DME instruction;Gait training;Stair training;Functional mobility training;Therapeutic activities;Therapeutic exercise;Balance training;Patient/family education    PT Goals (Current goals can be found in the Care Plan section)  Acute Rehab PT Goals Patient Stated Goal: return home PT Goal Formulation: With patient Time For Goal Achievement: 02/05/21 Potential to Achieve Goals: Good    Frequency Min 3X/week   Barriers to discharge Decreased caregiver support daughter works nights       AM-PAC PT "6 Clicks" Mobility  Outcome Measure Help needed turning from your back to your side while in a flat bed without  using bedrails?: A Little Help needed moving from lying on your back to sitting on the side of a flat bed without using bedrails?: A Little Help needed moving to and from a bed to a chair (including a wheelchair)?: A Little Help needed standing up from a chair using your arms (e.g., wheelchair or bedside chair)?: A Little Help needed to walk in hospital room?: A Little Help needed climbing 3-5 steps with a railing? : A Lot 6 Click Score: 17    End of Session Equipment Utilized During Treatment: Gait belt Activity Tolerance: Patient tolerated treatment well;Patient limited by fatigue Patient left: in bed;with call bell/phone within reach;with bed alarm set Nurse Communication: Mobility status (need for purewick) PT Visit Diagnosis: Unsteadiness on feet (R26.81);Other abnormalities of gait and mobility (R26.89);Muscle weakness (generalized) (M62.81)    Time: 5329-9242 PT Time Calculation (min) (ACUTE ONLY): 30 min   Charges:   PT Evaluation $PT Eval Low Complexity: 1 Low PT Treatments $Therapeutic Activity: 8-22 mins        Inocencio Homes, PT, DPT   Acute Rehabilitation Department Pager #: [phone]  Otho Bellows 01/22/2021, 1:09 PM

## 2021-01-22 NOTE — H&P (Addendum)
History and Physical  [name]:[id number] DOB: 1940-07-26 DOA: 01/21/2021  Referring physician: Dr. Roxanne Mins, [name]. PCP: Kelton Pillar, MD  Outpatient Specialists: Neurology, rheumatology, Patient coming from: Home.  Chief Complaint: Altered mental status.  HPI: [name] is a 80 y.o. female with medical history significant for essential tremors on primidone (started January 2022), fibromyalgia, essential hypertension, hyperlipidemia, chronic insomnia, who presented to [facility] ED from home via EMS after being found confused on the commode.  History is mainly obtained from her daughter at bedside.  At the time of this visit patient is back to her baseline mentation.  Per her daughter, she first noted mild confusion around midnight on Thursday while talking on the phone with her mother.  Her daughter who works night shift called and noted that her answers were out of character for her.  At 7 AM the same day she clearly noted that the confusion was worse.  Answering questions inappropriately and the patient did not want to see her.  She recently lost possession of a german sheppard dog she recently adopted and she was sad about it.  Thursday evening around 10:30 PM her daughter became concerned, she came to her mother's house however her mother would not open the door, she left.    She called her mother again the following day, Friday, the day of her presentation but she would not answer the call so she presented to the patient's house with officers from the Police [employer] around 5:30 PM.  They found her sitting on the commode.  It appeared she may have been sitting there for hours.  No urine or stools in the commode.  She was nonverbal.  No urinary symptoms.  No chest pain or dyspnea.  EMS was activated and she was brought to the ED for further evaluation.  Her mentation gradually improved and returned to baseline.  CT head was negative, urine analysis negative.  Troponin were elevated and  uptrending from 231 to 246.  Due to concern for sepsis, patient received IV fluid bolus 1L NS and was started on IV antibiotics, Rocephin, empirically.  Self-reported poor appetite and poor oral intake for the past 2 days.  TRH, hospitalist team, was asked to admit.  ED Course:  Temperature 99.7.  BP 157/78, pulse 114, respiration rate 29, oxygenation 98% on room air.  Lab studies remarkable for serum sodium 134, potassium 3.9, serum bicarb 13, glucose 153, BUN 32, creatinine 1.96, calcium 10.5, albumin 4.8, corrected calcium for albumin 9.9, T bilirubin 1.7, GFR 25,.  Troponin 231, 246.  WBC 17.3, hemoglobin 16.5, neutrophil 14.8.  Review of Systems: Review of systems as noted in the HPI. All other systems reviewed and are negative.   Past Medical History:  Diagnosis Date   BCC (basal cell carcinoma of skin) 03/27/2006   Above Left Upper Lip (MOH's)   Benign essential tremor    Chronic kidney disease    Essential hypertension, benign    Fibromyalgia    GERD (gastroesophageal reflux disease)    Heart murmur    Hemorrhoids    Hyponatremia    secondary to hctz-recurrent March 2010   Low back pain    facet joint pain   Occipital neuralgia    Osteoarthritis    Osteopenia    Pure hypercholesterolemia 12/16/2013   SCCA (squamous cell carcinoma) of skin 07/15/2002   Left Inner Cheek(in situ)   SCCA (squamous cell carcinoma) of skin 05/06/2004   Right Wing Nose(in situ) -Cx3,5FU  SCCA (squamous cell carcinoma) of skin 01/09/2011   Above Right Upper Lip(in situ)-Cx3   SCCA (squamous cell carcinoma) of skin 01/05/2014   Left Temple(in situ)-Cx3,5FU   SCCA (squamous cell carcinoma) of skin 09/30/2014   Right Wrist(in situ)-tx p bx   Shortness of breath dyspnea    Superficial basal cell carcinoma (BCC) 08/17/1982   Right Side Upper Lip   Tuberculosis    as a child   Past Surgical History:  Procedure Laterality Date   TOTAL KNEE ARTHROPLASTY Left 11/08/2015   Procedure: LEFT TOTAL KNEE  ARTHROPLASTY;  Surgeon: Gaynelle Arabian, MD;  Location: [facility] ORS;  Service: Orthopedics;  Laterality: Left;   TUBAL LIGATION      Social History:  reports that she has never smoked. She has never used smokeless tobacco. She reports current alcohol use. She reports that she does not use drugs.   Allergies  Allergen Reactions   Hctz [Hydrochlorothiazide] Other (See Comments)    Hyponatremia    Lisinopril Other (See Comments)    Hyponatremia   Sulfa Antibiotics Rash    Family History  Problem Relation Age of Onset   Heart disease Father    Prostate cancer Father    Heart disease Mother    Cancer - Ovarian Mother    Congestive Heart Failure Mother    Stroke Sister       Prior to Admission medications   Medication Sig Start Date End Date Taking? Authorizing Provider  amLODipine (NORVASC) 5 MG tablet TAKE ONE TABLET BY MOUTH DAILY Patient taking differently: Take 5 mg by mouth daily. 05/14/15  Yes Jettie Booze, MD  atenolol (TENORMIN) 25 MG tablet TAKE ONE TABLET BY MOUTH DAILY Patient taking differently: Take 25 mg by mouth daily. 07/12/16  Yes Jettie Booze, MD  diclofenac Sodium (VOLTAREN) 1 % GEL Apply 4 g topically 4 (four) times daily. 01/06/21  Yes Deno Etienne, DO  pregabalin (LYRICA) 75 MG capsule Take 75 mg by mouth daily.   Yes [provider]  primidone (MYSOLINE) 50 MG tablet 0.5 tab at night time, increase to bid after 30 days. Patient taking differently: Take 25 mg by mouth 2 (two) times daily. 08/23/20  Yes Dohmeier, Asencion Partridge, MD  simvastatin (ZOCOR) 40 MG tablet Take 1 tablet (40 mg total) by mouth at bedtime. 08/26/15  Yes Jettie Booze, MD  traMADol (ULTRAM) 50 MG tablet TAKE 2 TABLETS BY MOUTH 3 TIMES A DAY AS NEEDED Patient taking differently: Take 100 mg by mouth 2 (two) times daily as needed for severe pain or moderate pain. 05/28/17  Yes Ofilia Neas, PA-C  traZODone (DESYREL) 50 MG tablet Take 1 tablet (50 mg total) by mouth at bedtime.  08/23/20  Yes Dohmeier, Asencion Partridge, MD    Physical Exam: BP (!) 152/76   Pulse (!) 119   Temp 99.7 F (37.6 C) (Axillary)   Resp (!) 33   SpO2 98%   General: 80 y.o. year-old female well developed well nourished in no acute distress.  Alert and oriented x3. Cardiovascular: Regular rate and rhythm with no rubs or gallops.  No thyromegaly or JVD noted.  No lower extremity edema. 2/4 pulses in all 4 extremities. Respiratory: Clear to auscultation with no wheezes or rales. Good inspiratory effort. Abdomen: Soft diffuse tenderness with palpation.  Nondistended with normal bowel sounds x4 quadrants. Muskuloskeletal: No cyanosis, clubbing or edema noted bilaterally Neuro: CN II-XII intact, strength, sensation, reflexes Skin: No ulcerative lesions noted or rashes Psychiatry: Judgement and insight  appear normal. Mood is appropriate for condition and setting          Labs on Admission:  Basic Metabolic Panel: Recent Labs  Lab 01/21/21 2046  NA 134*  K 3.9  CL 97*  CO2 13*  GLUCOSE 153*  BUN 32*  CREATININE 1.96*  CALCIUM 10.5*   Liver Function Tests: Recent Labs  Lab 01/21/21 2046  AST 38  ALT 21  ALKPHOS 100  BILITOT 1.7*  PROT 8.3*  ALBUMIN 4.8   No results for input(s): LIPASE, AMYLASE in the last 168 hours. Recent Labs  Lab 01/21/21 2047  AMMONIA 19   CBC: Recent Labs  Lab 01/21/21 2046  WBC 17.3*  NEUTROABS 14.8*  HGB 16.5*  HCT 46.8*  MCV 94.4  PLT 318   Cardiac Enzymes: No results for input(s): CKTOTAL, CKMB, CKMBINDEX, TROPONINI in the last 168 hours.  BNP (last 3 results) No results for input(s): BNP in the last 8760 hours.  ProBNP (last 3 results) No results for input(s): PROBNP in the last 8760 hours.  CBG: No results for input(s): GLUCAP in the last 168 hours.  Radiological Exams on Admission: CT Head Wo Contrast  Result Date: 01/21/2021 CLINICAL DATA:  Mental status change EXAM: CT HEAD WITHOUT CONTRAST TECHNIQUE: Contiguous axial images  were obtained from the base of the skull through the vertex without intravenous contrast. COMPARISON:  CT brain 04/03/2013 FINDINGS: Brain: No acute territorial infarction, hemorrhage or intracranial mass. Mild atrophy. Nonenlarged ventricles. Minimal patchy white matter hypodensity consistent with chronic small vessel ischemic change. Vascular: Dense vessels.  Scattered carotid vascular calcification Skull: Normal. Negative for fracture or focal lesion. Sinuses/Orbits: No acute finding. Other: None IMPRESSION: 1. No CT evidence for acute intracranial abnormality. 2. Mild atrophy and chronic small vessel ischemic change of the white matter Electronically Signed   By: Donavan Foil M.D.   On: 01/21/2021 22:21   DG Chest Portable 1 View  Result Date: 01/21/2021 CLINICAL DATA:  Altered level of consciousness EXAM: PORTABLE CHEST 1 VIEW COMPARISON:  01/06/2021 FINDINGS: Single frontal view of the chest demonstrates an unremarkable cardiac silhouette. No airspace disease, effusion, or pneumothorax. IMPRESSION: 1. No acute intrathoracic process. Electronically Signed   By: Randa Ngo M.D.   On: 01/21/2021 21:19    EKG: I independently viewed the EKG done and my findings are as followed: Multifocal atrial tachycardia rate of 131, nonspecific ST-T changes.  QTc 465.  Assessment/Plan Present on Admission:  AMS (altered mental status)  Active Problems:   AMS (altered mental status)  Acute metabolic encephalopathy suspect multifactorial Appears to be back to her baseline mentation At the time of this visit, patient is alert and oriented x3. CT head non-acute UA negative for pyuria Obtain TSH Hypovolemic on exam, continue IV fluid hydration. Reorient if needed EEG was ordered and pending  Elevated troponin, suspect demand ischemia Presented with troponin 231, uptrending to 246 Repeat troponin No evidence of acute ischemia on twelve-lead EKG. Closely monitor on progressive unit. 2D echo ordered  and pending Was started on heparin drip in the ED EDP discussed case with cardiology  SIRS, unclear source Presented with leukocytosis, WBC 17 K, tachycardia rate of 120, tachypnea respiration rate of 25 UA negative for pyuria Chest x-ray no evidence of pneumonia Obtain CT abdomen pelvis without contrast, follow results. Obtain lactic acid, procalcitonin She was started on IV antibiotics, Rocephin, continue until active infective process is ruled out. Follow-up urine culture and blood cultures  AKI, prerenal in the setting  of dehydration and poor oral intake Self-reported poor appetite and poor oral intake for the past 2 days. IV fluid hydration Avoid nephrotoxic agents, dehydration, and hypotension Monitor urine output Repeat renal panel in the morning.  Anion gap metabolic acidosis likely contributed by renal insufficiency Presented with serum bicarb of 13 Obtain VBG to assess pH and PCO2. Started on isotonic bicarb drip 100 cc/h x 1 day Repeat renal panel.  Hypovolemic hyponatremia Serum sodium 134 Continue IV fluid hydration with isotonic bicarb  Isolated hyperbilirubinemia Nonspecific Repeat CMP in the morning.  Essential tremors On primidone prior to admission. Will need to follow-up with neurology  Essential hypertension BP is not at goal, elevated Resume home atenolol Closely monitor vital signs.  Insomnia Resume home trazodone nightly.   DVT prophylaxis: On heparin drip  Code Status: Full code  Family Communication: Daughter at bedside  Disposition Plan: Admit to progressive unit.  Consults called: EDP discussed the case with cardiology.  Admission status: Inpatient status.  Patient will require at least 2 midnights for further evaluation and treatment of present condition.   Status is: Inpatient    Dispo: The patient is from: Home.               Anticipated d/c is to: Home possibly on 01/24/2021 or when cardiology signs off.                Patient currently not stable for discharge.    Difficult to place patient, not applicable.       Kayleen Memos MD [facility] Pager [phone]  If 7PM-7AM, please contact night-coverage [email or web address] Password [facility]  01/22/2021, 2:51 AM

## 2021-01-22 NOTE — Progress Notes (Signed)
Care started prior to midnight in the emergency room and patient was admitted early this morning after midnight by Dr. Francia Greaves and I am in current agreement with assessment and plan.  Additional changes to the plan of care been made accordingly.  The patient is a elderly 80 year old Caucasian female with a past medical history significant for but not limited to tremors who was started on primidone, history of fibromyalgia, essential hypertension, hyperlipidemia, chronic insomnia as well as other comorbidities who presented to the [facility] emergency room via EMS after she was found to be confused on the commode.  Most of the history was obtained from the patient's daughter at bedside and the patient's daughter noted the patient had mild confusion around midnight on Thursday while talking the phone with her mother.  Her daughter works the night shift and called and noted that her answers were out of character for around 7 AM that morning she noted clear confusion was worse.  Patient was answering questions inappropriately and did not want to see her.  Recently she lost position of a [place] that she recently adopted and was sad about it.  On Thursday evening at 10:30 PM the patient's daughter became concerned and came to the mother's house however mother would not open the door and she left.  The following day on Friday, the day of presentation, the patient would not answer her phone call from her daughter so the daughter went to the patient's house with officers from the police department on 3:30 PM.  They found her sitting on the commode.  It appeared that she may been sitting there for hours.  There is no urine or stool in the commode.  Patient was nonverbal and EMS was activated she is brought to the ED for further evaluation.  Her mentation gradually improved in the ED and returned to baseline and head CT was negative and urinalysis was negative.  Troponins were elevated and uptrending.  Due to  concern for sepsis and patient received IV fluid bolus and started on antibiotics with IV Rocephin.  She has had poor appetite for last few days and [facility] was asked to admit this patient.  Currently she is being admitted and treated for the following but not limited to:    Acute metabolic encephalopathy suspect multifactorial -Appears to be back to her baseline mentation -At the time of this visit, patient is alert and oriented x3. -CT head non-acute -UA negative for pyuria but did show rare bacteria, negative leukocytes, negative nitrites, 0-5 squamous epithelial cells, 0-5 RBCs per high-power field, 0-5 WBCs -Obtain TSH and was 0.793 -Hypovolemic on exam, continue IV fluid hydration. -Reorient if needed -EEG was ordered and showed that the study was within normal limits -Continue to Monitor Closely    Elevated troponin, suspect demand ischemia -Presented with troponin 231, uptrending to 246 and then trended back down to 236 -Repeat troponin -No evidence of acute ischemia on twelve-lead EKG. -Closely monitor on progressive unit. -2D echo ordered and showed a left ventricular ejection fraction of greater than 75% with a left ventricule being hyperdynamic and function and she did have a left ventricular diastolic parameters that showed grade 1 diastolic dysfunction and she also had a normal right ventricular systolic function -Was started on heparin drip in the ED and will stop as she is not complaining of any Chest Pain  -EDP discussed case with cardiology Dr. Humphrey Rolls   SIRS, unclear source -Presented with leukocytosis, WBC 17 K, tachycardia rate  of 120, tachypnea respiration rate of 25 -UA negative for pyuria -Chest x-ray no evidence of pneumonia -Obtain CT abdomen pelvis without contrast and showed "No acute intra-abdominal pathology identified. No definite radiographic explanation for the patient's reported symptoms. Aortic Atherosclerosis" -Obtain lactic acid and was 1.7, Procalcitonin  <0.10 -Blood Cx x2 Pending  -She was started on IV antibiotics, Rocephin but will stop for now -Follow-up urine culture and blood cultures -WBC went from 17.3 -> 12.6 -Repeat CBC in the AM    AKI, prerenal in the setting of dehydration and poor oral intake -Self-reported poor appetite and poor oral intake for the past 2 days. -IV fluid hydration with Sodium Bicarbonate  -Avoid nephrotoxic agents, dehydration, and hypotension -Monitor urine output and Daily Weights -Patient's BUN/Cr 32/1.96 -> 28/1.37 -Repeat renal panel in the morning.   Anion gap metabolic acidosis likely contributed by renal insufficiency -Presented with serum bicarb of 13 -Obtain VBG to assess pH and PCO2. -Started on isotonic bicarb drip 100 cc/h x 1 day -Patient's CO2 is now 16, anion gap is 17, chloride level is 100 -Repeat CMP in the a.m.   Hypovolemic hyponatremia -Serum sodium 134 and repeat is now 133 -Continue IV fluid hydration with isotonic bicarb -Continue to monitor and trend and repeat CMP in a.m.   Isolated hyperbilirubinemia -Nonspecific -T bili went from 1.7 is now 1.1 -Repeat CMP in the morning.   Essential tremors -On primidone prior to admission. -Will need to follow-up with neurology   Essential hypertension -BP is not at goal, elevated -Resume home atenolol -Closely monitor vital signs.   Insomnia -Resume home trazodone nightly.  Hypokalemia -The patient's potassium this morning was 3.4 -Replete with p.o. potassium chloride 40 mEq -Continue monitor and replete as necessary -Repeat CMP in a.m.   SIGNIFICANT STUDIES: ECHO IMPRESSIONS     1. Murmur secondary to hyperdynamic LV contraction with turbulence  through outflow tract. There is no significant outflow tract gradient,  however.   2. Left ventricular ejection fraction, by estimation, is >75%. The left  ventricle has hyperdynamic function. The left ventricle has no regional  wall motion abnormalities. Left  ventricular diastolic parameters are  consistent with Grade I diastolic  dysfunction (impaired relaxation).   3. Right ventricular systolic function is normal. The right ventricular  size is normal. There is normal pulmonary artery systolic pressure. The  estimated right ventricular systolic pressure is 41.2 mmHg.   4. Left atrial size was mildly dilated.   5. The mitral valve is normal in structure. Trivial mitral valve  regurgitation. No evidence of mitral stenosis.   6. The aortic valve is normal in structure. Aortic valve regurgitation is  not visualized. No aortic stenosis is present.   7. The inferior vena cava is normal in size with greater than 50%  respiratory variability, suggesting right atrial pressure of 3 mmHg.   FINDINGS   Left Ventricle: Left ventricular ejection fraction, by estimation, is  >75%. The left ventricle has hyperdynamic function. The left ventricle has  no regional wall motion abnormalities. The left ventricular internal  cavity size was normal in size. There  is no left ventricular hypertrophy. Left ventricular diastolic parameters  are consistent with Grade I diastolic dysfunction (impaired relaxation).   Right Ventricle: The right ventricular size is normal. No increase in  right ventricular wall thickness. Right ventricular systolic function is  normal. There is normal pulmonary artery systolic pressure. The tricuspid  regurgitant velocity is 2.16 m/s, and   with an assumed right  atrial pressure of 3 mmHg, the estimated right  ventricular systolic pressure is 36.1 mmHg.   Left Atrium: Left atrial size was mildly dilated.   Right Atrium: Right atrial size was normal in size.   Pericardium: There is no evidence of pericardial effusion.   Mitral Valve: The mitral valve is normal in structure. Trivial mitral  valve regurgitation. No evidence of mitral valve stenosis.   Tricuspid Valve: The tricuspid valve is normal in structure. Tricuspid  valve  regurgitation is trivial. No evidence of tricuspid stenosis.   Aortic Valve: The aortic valve is normal in structure. Aortic valve  regurgitation is not visualized. No aortic stenosis is present.   Pulmonic Valve: The pulmonic valve was normal in structure. Pulmonic valve  regurgitation is not visualized. No evidence of pulmonic stenosis.   Aorta: The aortic root is normal in size and structure.   Venous: The inferior vena cava is normal in size with greater than 50%  respiratory variability, suggesting right atrial pressure of 3 mmHg.   IAS/Shunts: No atrial level shunt detected by color flow Doppler.      LEFT VENTRICLE  PLAX 2D  LVIDd:         4.40 cm  LVIDs:         3.20 cm  LV PW:         0.90 cm  LV IVS:        1.10 cm  LVOT diam:     1.60 cm  LV SV:         53  LV SV Index:   29  LVOT Area:     2.01 cm      RIGHT VENTRICLE             IVC  RV Basal diam:  2.90 cm     IVC diam: 1.60 cm  RV S prime:     14.40 cm/s  TAPSE (M-mode): 1.6 cm   LEFT ATRIUM             Index       RIGHT ATRIUM           Index  LA diam:        3.80 cm 2.09 cm/m  RA Area:     10.80 cm  LA Vol (A2C):   68.2 ml 37.48 ml/m RA Volume:   22.80 ml  12.53 ml/m  LA Vol (A4C):   73.6 ml 40.45 ml/m  LA Biplane Vol: 72.8 ml 40.01 ml/m   AORTIC VALVE  LVOT Vmax:   138.50 cm/s  LVOT Vmean:  90.700 cm/s  LVOT VTI:    0.264 m     AORTA  Ao Root diam: 3.00 cm  Ao Asc diam:  2.60 cm   MITRAL VALVE               TRICUSPID VALVE  MV Area (PHT): 2.83 cm    TR Peak grad:   18.7 mmHg  MV Decel Time: 268 msec    TR Vmax:        216.00 cm/s  MV E velocity: 77.10 cm/s  MV A velocity: 99.10 cm/s  SHUNTS  MV E/A ratio:  0.78        Systemic VTI:  0.26 m                             Systemic Diam: 1.60 cm   Pain a PT OT evaluation continue monitor  and trend and repeat blood work in the a.m. and continue monitor patient's clinical response to intervention closely.

## 2021-01-22 NOTE — Progress Notes (Signed)
  Echocardiogram 2D Echocardiogram has been performed.  Tevon Berhane G Nesiah Jump 01/22/2021, 8:52 AM

## 2021-01-22 NOTE — Progress Notes (Signed)
ANTICOAGULATION CONSULT NOTE - Initial Consult  Pharmacy Consult for Heparin  Indication: atrial fibrillation  Allergies  Allergen Reactions   Hctz [Hydrochlorothiazide] Other (See Comments)    Hyponatremia    Lisinopril Other (See Comments)    Hyponatremia   Sulfa Antibiotics Rash    Vital Signs: Temp: 99.7 F (37.6 C) (07/16 0157) Temp Source: Axillary (07/16 0157) BP: 152/76 (07/16 0030) Pulse Rate: 119 (07/16 0030)  Labs: Recent Labs    01/21/21 2046 01/21/21 2256  HGB 16.5*  --   HCT 46.8*  --   PLT 318  --   CREATININE 1.96*  --   TROPONINIHS 231* 246*    CrCl cannot be calculated (Unknown ideal weight.).   Medical History: Past Medical History:  Diagnosis Date   BCC (basal cell carcinoma of skin) 03/27/2006   Above Left Upper Lip (MOH's)   Benign essential tremor    Chronic kidney disease    Essential hypertension, benign    Fibromyalgia    GERD (gastroesophageal reflux disease)    Heart murmur    Hemorrhoids    Hyponatremia    secondary to hctz-recurrent March 2010   Low back pain    facet joint pain   Occipital neuralgia    Osteoarthritis    Osteopenia    Pure hypercholesterolemia 12/16/2013   SCCA (squamous cell carcinoma) of skin 07/15/2002   Left Inner Cheek(in situ)   SCCA (squamous cell carcinoma) of skin 05/06/2004   Right Wing Nose(in situ) -Cx3,5FU   SCCA (squamous cell carcinoma) of skin 01/09/2011   Above Right Upper Lip(in situ)-Cx3   SCCA (squamous cell carcinoma) of skin 01/05/2014   Left Temple(in situ)-Cx3,5FU   SCCA (squamous cell carcinoma) of skin 09/30/2014   Right Wrist(in situ)-tx p bx   Shortness of breath dyspnea    Superficial basal cell carcinoma (BCC) 08/17/1982   Right Side Upper Lip   Tuberculosis    as a child    Assessment: 80 y/o F with atrial fibrillation vs multi-focal atrial tachycardia. Starting anti-coagulation for now with heparin. PTA meds reviewed. CBC good. Noted renal dysfunction.   Goal of  Therapy:  Heparin level 0.3-0.7 units/ml Monitor platelets by anticoagulation protocol: Yes   Plan:  Heparin 4000 units BOLUS Start heparin drip at 850 units/hr 1000 Heparin level Daily CBC/Heparin level Monitor for bleeding  Narda Bonds, PharmD, BCPS Clinical Pharmacist Phone: [phone]

## 2021-01-23 ENCOUNTER — Inpatient Hospital Stay (HOSPITAL_COMMUNITY): Payer: PPO

## 2021-01-23 DIAGNOSIS — E871 Hypo-osmolality and hyponatremia: Secondary | ICD-10-CM

## 2021-01-23 DIAGNOSIS — E876 Hypokalemia: Secondary | ICD-10-CM

## 2021-01-23 DIAGNOSIS — L8990 Pressure ulcer of unspecified site, unspecified stage: Secondary | ICD-10-CM | POA: Insufficient documentation

## 2021-01-23 DIAGNOSIS — R197 Diarrhea, unspecified: Secondary | ICD-10-CM

## 2021-01-23 LAB — CBC WITH DIFFERENTIAL/PLATELET
Abs Immature Granulocytes: 0.02 10*3/uL (ref 0.00–0.07)
Basophils Absolute: 0 10*3/uL (ref 0.0–0.1)
Basophils Relative: 0 %
Eosinophils Absolute: 0 10*3/uL (ref 0.0–0.5)
Eosinophils Relative: 0 %
HCT: 37.7 % (ref 36.0–46.0)
Hemoglobin: 13.4 g/dL (ref 12.0–15.0)
Immature Granulocytes: 0 %
Lymphocytes Relative: 28 %
Lymphs Abs: 1.9 10*3/uL (ref 0.7–4.0)
MCH: 32.9 pg (ref 26.0–34.0)
MCHC: 35.5 g/dL (ref 30.0–36.0)
MCV: 92.6 fL (ref 80.0–100.0)
Monocytes Absolute: 0.8 10*3/uL (ref 0.1–1.0)
Monocytes Relative: 11 %
Neutro Abs: 4.1 10*3/uL (ref 1.7–7.7)
Neutrophils Relative %: 61 %
Platelets: 146 10*3/uL — ABNORMAL LOW (ref 150–400)
RBC: 4.07 MIL/uL (ref 3.87–5.11)
RDW: 12.8 % (ref 11.5–15.5)
WBC: 6.8 10*3/uL (ref 4.0–10.5)
nRBC: 0 % (ref 0.0–0.2)

## 2021-01-23 LAB — COMPREHENSIVE METABOLIC PANEL
ALT: 16 U/L (ref 0–44)
AST: 42 U/L — ABNORMAL HIGH (ref 15–41)
Albumin: 3.1 g/dL — ABNORMAL LOW (ref 3.5–5.0)
Alkaline Phosphatase: 67 U/L (ref 38–126)
Anion gap: 11 (ref 5–15)
BUN: 13 mg/dL (ref 8–23)
CO2: 27 mmol/L (ref 22–32)
Calcium: 8.5 mg/dL — ABNORMAL LOW (ref 8.9–10.3)
Chloride: 93 mmol/L — ABNORMAL LOW (ref 98–111)
Creatinine, Ser: 0.89 mg/dL (ref 0.44–1.00)
GFR, Estimated: >60 mL/min (ref >60)
Glucose, Bld: 109 mg/dL — ABNORMAL HIGH (ref 70–99)
Potassium: 3 mmol/L — ABNORMAL LOW (ref 3.5–5.1)
Sodium: 131 mmol/L — ABNORMAL LOW (ref 135–145)
Total Bilirubin: 0.9 mg/dL (ref 0.3–1.2)
Total Protein: 5.7 g/dL — ABNORMAL LOW (ref 6.5–8.1)

## 2021-01-23 LAB — MAGNESIUM: Magnesium: 1.8 mg/dL (ref 1.7–2.4)

## 2021-01-23 LAB — URINE CULTURE: Culture: NO GROWTH

## 2021-01-23 LAB — PHOSPHORUS: Phosphorus: 1.9 mg/dL — ABNORMAL LOW (ref 2.5–4.6)

## 2021-01-23 MED ORDER — ENOXAPARIN SODIUM 40 MG/0.4ML IJ SOSY
40.0000 mg | PREFILLED_SYRINGE | INTRAMUSCULAR | Status: DC
  Administered 2021-01-23 – 2021-01-26 (×4): 40 mg via SUBCUTANEOUS
  Filled 2021-01-23 (×4): qty 0.4

## 2021-01-23 MED ORDER — MAGNESIUM SULFATE 2 GM/50ML IV SOLN
2.0000 g | Freq: Once | INTRAVENOUS | Status: AC
  Administered 2021-01-23: 2 g via INTRAVENOUS
  Filled 2021-01-23: qty 50

## 2021-01-23 MED ORDER — SODIUM CHLORIDE 0.9 % IV SOLN: INTRAVENOUS | Status: DC

## 2021-01-23 MED ORDER — POTASSIUM CHLORIDE CRYS ER 20 MEQ PO TBCR
40.0000 meq | EXTENDED_RELEASE_TABLET | Freq: Once | ORAL | Status: AC
  Administered 2021-01-23: 40 meq via ORAL
  Filled 2021-01-23: qty 2

## 2021-01-23 MED ORDER — POTASSIUM PHOSPHATES 15 MMOLE/5ML IV SOLN
20.0000 mmol | Freq: Once | INTRAVENOUS | Status: AC
  Administered 2021-01-23: 20 mmol via INTRAVENOUS
  Filled 2021-01-23: qty 6.67

## 2021-01-23 NOTE — Evaluation (Signed)
Clinical/Bedside Swallow Evaluation Patient Details  Name: [name] MRN: [id number] Date of Birth: Dec 04, 1940  Today's Date: 01/23/2021 Time: SLP Start Time (ACUTE ONLY): 8938 SLP Stop Time (ACUTE ONLY): 1017 SLP Time Calculation (min) (ACUTE ONLY): 15 min  Past Medical History:  Past Medical History:  Diagnosis Date   BCC (basal cell carcinoma of skin) 03/27/2006   Above Left Upper Lip (MOH's)   Benign essential tremor    Chronic kidney disease    Essential hypertension, benign    Fibromyalgia    GERD (gastroesophageal reflux disease)    Heart murmur    Hemorrhoids    Hyponatremia    secondary to hctz-recurrent March 2010   Low back pain    facet joint pain   Occipital neuralgia    Osteoarthritis    Osteopenia    Pure hypercholesterolemia 12/16/2013   SCCA (squamous cell carcinoma) of skin 07/15/2002   Left Inner Cheek(in situ)   SCCA (squamous cell carcinoma) of skin 05/06/2004   Right Wing Nose(in situ) -Cx3,5FU   SCCA (squamous cell carcinoma) of skin 01/09/2011   Above Right Upper Lip(in situ)-Cx3   SCCA (squamous cell carcinoma) of skin 01/05/2014   Left Temple(in situ)-Cx3,5FU   SCCA (squamous cell carcinoma) of skin 09/30/2014   Right Wrist(in situ)-tx p bx   Shortness of breath dyspnea    Superficial basal cell carcinoma (BCC) 08/17/1982   Right Side Upper Lip   Tuberculosis    as a child   Past Surgical History:  Past Surgical History:  Procedure Laterality Date   TOTAL KNEE ARTHROPLASTY Left 11/08/2015   Procedure: LEFT TOTAL KNEE ARTHROPLASTY;  Surgeon: Gaynelle Arabian, MD;  Location: [facility];  Service: Orthopedics;  Laterality: Left;   TUBAL LIGATION     HPI:  [name] is an 80 y.o. female who presented to the ED after being found confused on the commode. CT head and CXR on admission were negative. EEG 7/16 WNL. PMH: essential tremors on primidone (started January 2022), fibromyalgia, essential hypertension, hyperlipidemia, chronic insomnia.   Assessment /  Plan / Recommendation Clinical Impression  [name] was seen for bedside swallow evaluation with her daughter present. [name]'s daughter denied the [name] having a history of dysphagia, but stated that she did find a potassium pill in the [name]'s mouth after administration. [name]'s processing speed was reduced and she exhibited some difficulty following commands. Oral mechanism exam was limited due to [name]'s difficulty following some commands; however, oral motor strength and ROM appeared grossly WFL. Dentition was adequate with some missing molars. She tolerated all solids and liquids without signs or symptoms of oropharyngeal dysphagia. A regular texture diet with thin liquids is recommended at this time and further skilled SLP services are not clinically indicated for swallowing. SLP Visit Diagnosis: Dysphagia, unspecified (R13.10)    Aspiration Risk  No limitations    Diet Recommendation Regular;Thin liquid   Liquid Administration via: Cup;Straw Medication Administration: Whole meds with liquid (consider larger pills be given in applesauce) Supervision: Staff to assist with self feeding Postural Changes: Seated upright at 90 degrees    Other  Recommendations Oral Care Recommendations: Oral care BID   Follow up Recommendations None      Frequency and Duration            Prognosis        Swallow Study   General Date of Onset: 01/22/21 HPI: [name] is an 80 y.o. female who presented to the ED after being found confused on the commode. CT head  and CXR on admission were negative. EEG 7/16 WNL. PMH: essential tremors on primidone (started January 2022), fibromyalgia, essential hypertension, hyperlipidemia, chronic insomnia. Type of Study: Bedside Swallow Evaluation Previous Swallow Assessment: none Diet Prior to this Study: Regular;Thin liquids Temperature Spikes Noted: No Respiratory Status: Room air History of Recent Intubation: No Behavior/Cognition: Alert;Cooperative;Pleasant mood;Requires  cueing;Confused Oral Cavity Assessment: Within Functional Limits Oral Care Completed by SLP: No Oral Cavity - Dentition: Adequate natural dentition Vision: Functional for self-feeding Self-Feeding Abilities: Needs assist Patient Positioning: Upright in bed;Postural control adequate for testing Baseline Vocal Quality: Normal Volitional Cough: Cognitively unable to elicit Volitional Swallow: Able to elicit    Oral/Motor/Sensory Function Overall Oral Motor/Sensory Function: Within functional limits (though limited)   Ice Chips Ice chips: Within functional limits Presentation: Spoon   Thin Liquid Thin Liquid: Within functional limits Presentation: Straw    Nectar Thick Nectar Thick Liquid: Not tested   Honey Thick Honey Thick Liquid: Not tested   Puree Puree: Within functional limits Presentation: Spoon;Self Fed   Solid     Solid: Within functional limits Presentation: Self Fed     [name], [place], [place] Office number [phone] Pager [phone]  Horton Marshall 01/23/2021,10:37 AM

## 2021-01-23 NOTE — Progress Notes (Signed)
PROGRESS NOTE    [name]  PFX:[id number] DOB: 1940-10-04 DOA: 01/21/2021 PCP: Kelton Pillar, MD   Brief Narrative:  The patient is a elderly 80 year old Caucasian female with a past medical history significant for but not limited to tremors who was started on primidone, history of fibromyalgia, essential hypertension, hyperlipidemia, chronic insomnia as well as other comorbidities who presented to the [facility] emergency room via EMS after she was found to be confused on the commode.  Most of the history was obtained from the patient's daughter at bedside and the patient's daughter noted the patient had mild confusion around midnight on Thursday while talking the phone with her mother.  Her daughter works the night shift and called and noted that her answers were out of character for around 7 AM that morning she noted clear confusion was worse.  Patient was answering questions inappropriately and did not want to see her.  Recently she lost position of a [place] that she recently adopted and was sad about it.  On Thursday evening at 10:30 PM the patient's daughter became concerned and came to the mother's house however mother would not open the door and she left.  The following day on Friday, the day of presentation, the patient would not answer her phone call from her daughter so the daughter went to the patient's house with officers from the police department on 3:29 PM.  They found her sitting on the commode.  It appeared that she may been sitting there for hours.  There is no urine or stool in the commode.  Patient was nonverbal and EMS was activated she is brought to the ED for further evaluation.  Her mentation gradually improved in the ED and returned to baseline and head CT was negative and urinalysis was negative.  Troponins were elevated and uptrending.  Due to concern for sepsis and patient received IV fluid bolus and started on antibiotics with IV Rocephin.  She has had poor  appetite for last few days and [facility] was asked to admit this patient.  **Interim History Daughter states that patient has been having significant diarrhea since prior to admission.  We will work-up her diarrhea and obtain C. difficile as well as a GI pathogen panel.  Patient daughter also thinks that she is not back to her baseline even though she was awake and alert and oriented x3.  We will obtain an MRI for further evaluation.  EEG was unremarkable.  PT OT recommending home health.  Electrolytes are low in the setting of her diarrhea and will be replete.  We will continue monitor carefully and see if her mentation improves.  Her leukocytosis has improved as well as her AKI.   Assessment & Plan:   Active Problems:   AMS (altered mental status)   Pressure injury of skin    Acute metabolic encephalopathy suspect multifactorial -Appeared to be back to her baseline mentation as she was awake and alert and oriented x3 however daughter does not think she is back to her baseline yet as she has been significantly confused still and having trouble following commands; after further discussion with the patient's daughter the daughter thinks that she may have overdosed on her trazodone given her recent stressful event with her dog. -Continue IV fluid hydration as below  -CT head non-acute -UA negative for pyuria but did show rare bacteria, negative leukocytes, negative nitrites, 0-5 squamous epithelial cells, 0-5 RBCs per high-power field, 0-5 WBCs -Obtain TSH and was 0.793 -Hypovolemic  on exam, continue IV fluid hydration and her AKI is improving -Reorient if needed -EEG was ordered and showed that the study was within normal limits -Because of persistent confusion per the daughter we will obtain an MRI of the brain without contrast -Daughter now states that the patient is having significant diarrhea that was not mentioned prior to admission -Continue to Monitor Closely and if mentation does not improve  even after getting an MRI we will consult neurology for further evaluation and possibly psychiatry to see if this is a behavioral thing -MRI Done and showed "No acute abnormality. Moderate white matter changes consistent with chronic microvascular ischemia." -Check B12, RPR, Ammonia   Elevated troponin, suspect demand ischemia -Presented with troponin 231, uptrending to 246 and then trended back down to 236 -Repeat troponin -No evidence of acute ischemia on twelve-lead EKG. -Closely monitor on progressive unit. -2D echo ordered and showed a left ventricular ejection fraction of greater than 75% with a left ventricule being hyperdynamic and function and she did have a left ventricular diastolic parameters that showed grade 1 diastolic dysfunction and she also had a normal right ventricular systolic function -Was started on heparin drip in the ED and will stop as she is not complaining of any Chest Pain -EDP discussed case with cardiology Dr. Humphrey Rolls.  We feel that her EKG is nonacute and we have stopped her heparin drip as well -If she develops chest pain will consult cardiology   SIRS, unclear source could be a diarrheal source -Presented with leukocytosis, WBC 17 K, tachycardia rate of 120, tachypnea respiration rate of 25; now leukocytosis is resolved and WBC is  6.8 -UA negative for pyuria -Chest x-ray no evidence of pneumonia -Obtain CT abdomen pelvis without contrast and showed "No acute intra-abdominal pathology identified. No definite radiographic explanation for the patient's reported symptoms. Aortic Atherosclerosis" -Obtain lactic acid and was 1.7, Procalcitonin <0.10 -Blood Cx x2 Pending -Because she continues to have significant diarrhea per the daughter we will check a C. difficile PCR as well as a GI pathogen panel -She was started on IV antibiotics and received IV ceftriaxone but we have discontinued this. -Follow-up urine culture and blood cultures -WBC went from 17.3 -> 12.6  and is now 6.8 -Repeat CBC in the AM    AKI, prerenal in the setting of dehydration and poor oral intake -Self-reported poor appetite and poor oral intake for the past 2 days. -IV fluid hydration with Sodium Bicarbonate now stopped and resumed sodium chloride at 75 MLS per hour -Avoid nephrotoxic agents, dehydration, and hypotension and renally adjust medications -Monitor urine output and Daily Weights -Patient's BUN/Cr 32/1.96 -> 28/1.37 and is further improved to 13/0.89 -Repeat CMP in a.m.   Anion gap metabolic acidosis likely contributed by renal insufficiency -Presented with serum bicarb of 13 and this is now improved significantly. -Obtain VBG to assess pH and PCO2. -Started on isotonic bicarb drip 100 cc/h x 1 day and this has now been stopped.  We will now start normal saline at 75 MLS per hour -Patient's CO2 was now 16, anion gap was 17, chloride level was 100; now patient's CO2 is 27, anion gap is 11, chloride level is 93 -Repeat CMP in the a.m.   Hypovolemic hyponatremia -Serum sodium 134 and repeat is now 133 yesterday and today is now 131 -Continued IV fluid hydration with isotonic bicarb but will stop this and start normal saline -Continue to monitor and trend and repeat CMP in a.m.   Isolated hyperbilirubinemia -  Nonspecific -T bili went from 1.7 is now normalized at 0.9 -Repeat CMP in the morning.   Essential tremors -On primidone prior to admission. -Will need to follow-up with neurology   Essential hypertension -BP is not at goal, elevated -Resume home atenolol 25 mg p.o. daily and have added amlodipine 5 mg p.o. daily -Closely monitor vital signs carefully and continue monitor per protocol -Last blood pressure reading was 133/66   Insomnia -Resumed trazodone initially but will hold given that daughter is concerned that she may have overdosed on this.   Hypokalemia -The patient's potassium this morning was 3.0 -Replete with p.o. potassium chloride 40 mEq  once again and also replete with IV K-Phos 20 mmol -Continue monitor and replete as necessary -Repeat CMP in a.m.  Hypophosphatemia -Patient's Phos level is 1.9 -Replete with IV K-Phos 20 mmol -Continue monitor and replete as necessary -Repeat phosphorus level in a.m.  Thrombocytopenia -Mild and could be dilutional drop -Patient's platelet count went from 202 and is now 146 -Continue to monitor for signs and symptoms of bleeding; currently no overt bleeding noted -Repeat CBC in a.m.  DVT prophylaxis: SCDs; will add enoxaparin 40 mg subcu Code Status: FULL CODE  Family Communication: Discussed with Daughter extensively at bedside  Disposition Plan: Pending improvement back to baseline clearance by PT OT.  PT OT recommending home health  Status is: Inpatient  Remains inpatient appropriate because:Altered mental status, Unsafe d/c plan, IV treatments appropriate due to intensity of illness or inability to take PO, and Inpatient level of care appropriate due to severity of illness  Dispo: The patient is from: Home              Anticipated d/c is to: Home              Patient currently is not medically stable to d/c.   Difficult to place patient No  Consultants:  None  Procedures:  ECHOCARDIOGRAM IMPRESSIONS     1. Murmur secondary to hyperdynamic LV contraction with turbulence  through outflow tract. There is no significant outflow tract gradient,  however.   2. Left ventricular ejection fraction, by estimation, is >75%. The left  ventricle has hyperdynamic function. The left ventricle has no regional  wall motion abnormalities. Left ventricular diastolic parameters are  consistent with Grade I diastolic  dysfunction (impaired relaxation).   3. Right ventricular systolic function is normal. The right ventricular  size is normal. There is normal pulmonary artery systolic pressure. The  estimated right ventricular systolic pressure is 62.3 mmHg.   4. Left atrial size was  mildly dilated.   5. The mitral valve is normal in structure. Trivial mitral valve  regurgitation. No evidence of mitral stenosis.   6. The aortic valve is normal in structure. Aortic valve regurgitation is  not visualized. No aortic stenosis is present.   7. The inferior vena cava is normal in size with greater than 50%  respiratory variability, suggesting right atrial pressure of 3 mmHg.   FINDINGS   Left Ventricle: Left ventricular ejection fraction, by estimation, is  >75%. The left ventricle has hyperdynamic function. The left ventricle has  no regional wall motion abnormalities. The left ventricular internal  cavity size was normal in size. There  is no left ventricular hypertrophy. Left ventricular diastolic parameters  are consistent with Grade I diastolic dysfunction (impaired relaxation).   Right Ventricle: The right ventricular size is normal. No increase in  right ventricular wall thickness. Right ventricular systolic function is  normal.  There is normal pulmonary artery systolic pressure. The tricuspid  regurgitant velocity is 2.16 m/s, and   with an assumed right atrial pressure of 3 mmHg, the estimated right  ventricular systolic pressure is 85.6 mmHg.   Left Atrium: Left atrial size was mildly dilated.   Right Atrium: Right atrial size was normal in size.   Pericardium: There is no evidence of pericardial effusion.   Mitral Valve: The mitral valve is normal in structure. Trivial mitral  valve regurgitation. No evidence of mitral valve stenosis.   Tricuspid Valve: The tricuspid valve is normal in structure. Tricuspid  valve regurgitation is trivial. No evidence of tricuspid stenosis.   Aortic Valve: The aortic valve is normal in structure. Aortic valve  regurgitation is not visualized. No aortic stenosis is present.   Pulmonic Valve: The pulmonic valve was normal in structure. Pulmonic valve  regurgitation is not visualized. No evidence of pulmonic stenosis.    Aorta: The aortic root is normal in size and structure.   Venous: The inferior vena cava is normal in size with greater than 50%  respiratory variability, suggesting right atrial pressure of 3 mmHg.   IAS/Shunts: No atrial level shunt detected by color flow Doppler.      LEFT VENTRICLE  PLAX 2D  LVIDd:         4.40 cm  LVIDs:         3.20 cm  LV PW:         0.90 cm  LV IVS:        1.10 cm  LVOT diam:     1.60 cm  LV SV:         53  LV SV Index:   29  LVOT Area:     2.01 cm      RIGHT VENTRICLE             IVC  RV Basal diam:  2.90 cm     IVC diam: 1.60 cm  RV S prime:     14.40 cm/s  TAPSE (M-mode): 1.6 cm   LEFT ATRIUM             Index       RIGHT ATRIUM           Index  LA diam:        3.80 cm 2.09 cm/m  RA Area:     10.80 cm  LA Vol (A2C):   68.2 ml 37.48 ml/m RA Volume:   22.80 ml  12.53 ml/m  LA Vol (A4C):   73.6 ml 40.45 ml/m  LA Biplane Vol: 72.8 ml 40.01 ml/m   AORTIC VALVE  LVOT Vmax:   138.50 cm/s  LVOT Vmean:  90.700 cm/s  LVOT VTI:    0.264 m     AORTA  Ao Root diam: 3.00 cm  Ao Asc diam:  2.60 cm   MITRAL VALVE               TRICUSPID VALVE  MV Area (PHT): 2.83 cm    TR Peak grad:   18.7 mmHg  MV Decel Time: 268 msec    TR Vmax:        216.00 cm/s  MV E velocity: 77.10 cm/s  MV A velocity: 99.10 cm/s  SHUNTS  MV E/A ratio:  0.78        Systemic VTI:  0.26 m  Systemic Diam: 1.60 cm   Candee Furbish MD   EEG   Antimicrobials: (specify start and planned stop date. Auto populated tables are space occupying and do not give end dates) Anti-infectives (From admission, onward)    Start     Dose/Rate Route Frequency Ordered Stop   01/22/21 0415  cefTRIAXone (ROCEPHIN) 2 g in sodium chloride 0.9 % 100 mL IVPB  Status:  Discontinued        2 g 200 mL/hr over 30 Minutes Intravenous Every 24 hours 01/22/21 0413 01/22/21 1555   01/21/21 2330  cefTRIAXone (ROCEPHIN) 1 g in sodium chloride 0.9 % 100 mL IVPB  Status:   Discontinued        1 g 200 mL/hr over 30 Minutes Intravenous  Once 01/21/21 2318 01/22/21 0457        Subjective: Seen and examined at bedside and daughter thinks that she is still confused.  Daughter also states that she has been having some diarrhea that has been significant and watery.  Patient denies any current complaints.  She feels okay.  No other concerns or complaints at this time and daughter thinks that she may have overdosed on trazodone that led to the symptoms.  Objective: Vitals:   01/23/21 0756 01/23/21 0800 01/23/21 1146 01/23/21 1548  BP: (!) 153/58  126/75 133/66  Pulse: 80  71 73  Resp: 18  18 18   Temp: 98.1 F (36.7 C)  98.5 F (36.9 C) 98.4 F (36.9 C)  TempSrc: Oral  Oral Oral  SpO2:  94% 97% 98%    Intake/Output Summary (Last 24 hours) at 01/23/2021 1650 Last data filed at 01/23/2021 1500 Gross per 24 hour  Intake 940 ml  Output 450 ml  Net 490 ml   There were no vitals filed for this visit.  Examination: Physical Exam:  Constitutional: Thin elderly Caucasian female currently in no acute distress appears calm  Eyes: Lids and conjunctivae normal, sclerae anicteric  ENMT: External Ears, Nose appear normal. Grossly normal hearing.  Neck: Appears normal, supple, no cervical masses, normal ROM, no appreciable thyromegaly; no JVD Respiratory: Diminished to auscultation bilaterally with coarse breath sounds, no wheezing, rales, rhonchi or crackles. Normal respiratory effort and patient is not tachypenic. No accessory muscle use.  Unlabored breathing Cardiovascular: RRR but has some PACs noted on telemetry, has a murmur noted. S1 and S2 auscultated.  No appreciable edema Abdomen: Soft, non-tender, non-distended.  Bowel sounds positive.  GU: Deferred. Musculoskeletal: No clubbing / cyanosis of digits/nails. No joint deformity upper and lower extremities.  Skin: No rashes, lesions, ulcers on limited skin evaluation. No induration; Warm and dry.  Neurologic:  CN 2-12 grossly intact with no focal deficits. Romberg sign and cerebellar reflexes not assessed.  Psychiatric: Normal judgment and insight.  She is awake and alert. Normal mood and appropriate affect.   Data Reviewed: I have personally reviewed following labs and imaging studies  CBC: Recent Labs  Lab 01/21/21 2046 01/22/21 0500 01/23/21 0332  WBC 17.3* 12.6* 6.8  NEUTROABS 14.8*  --  4.1  HGB 16.5* 13.3  12.9 13.4  HCT 46.8* 37.6  38.0 37.7  MCV 94.4 94.0 92.6  PLT 318 202 518*   Basic Metabolic Panel: Recent Labs  Lab 01/21/21 2046 01/22/21 0500 01/23/21 0332  NA 134* 133*  136 131*  K 3.9 3.4*  3.4* 3.0*  CL 97* 100 93*  CO2 13* 16* 27  GLUCOSE 153* 112* 109*  BUN 32* 28* 13  CREATININE 1.96* 1.37*  0.89  CALCIUM 10.5* 8.7* 8.5*  MG  --  1.8 1.8  PHOS  --  2.8 1.9*   GFR: CrCl cannot be calculated (Unknown ideal weight.). Liver Function Tests: Recent Labs  Lab 01/21/21 2046 01/22/21 0500 01/23/21 0332  AST 38 42* 42*  ALT 21 17 16   ALKPHOS 100 71 67  BILITOT 1.7* 1.1 0.9  PROT 8.3* 6.3* 5.7*  ALBUMIN 4.8 3.5 3.1*   No results for input(s): LIPASE, AMYLASE in the last 168 hours. Recent Labs  Lab 01/21/21 2047  AMMONIA 19   Coagulation Profile: No results for input(s): INR, PROTIME in the last 168 hours. Cardiac Enzymes: No results for input(s): CKTOTAL, CKMB, CKMBINDEX, TROPONINI in the last 168 hours. BNP (last 3 results) No results for input(s): PROBNP in the last 8760 hours. HbA1C: No results for input(s): HGBA1C in the last 72 hours. CBG: No results for input(s): GLUCAP in the last 168 hours. Lipid Profile: No results for input(s): CHOL, HDL, LDLCALC, TRIG, CHOLHDL, LDLDIRECT in the last 72 hours. Thyroid Function Tests: Recent Labs    01/22/21 0318  TSH 0.793   Anemia Panel: No results for input(s): VITAMINB12, FOLATE, FERRITIN, TIBC, IRON, RETICCTPCT in the last 72 hours. Sepsis Labs: Recent Labs  Lab 01/22/21 0318  01/22/21 0500  PROCALCITON  --  <0.10  LATICACIDVEN 1.7  --     Recent Results (from the past 240 hour(s))  Urine Culture     Status: None   Collection Time: 01/21/21  1:37 AM   Specimen: Urine, Catheterized  Result Value Ref Range Status   Specimen Description URINE, CATHETERIZED  Final   Special Requests NONE  Final   Culture   Final    NO GROWTH Performed at [facility], [address] [place], [place] [address]    Report Status 01/23/2021 FINAL  Final  Resp Panel by RT-PCR (Flu A&B, Covid) Nasopharyngeal Swab     Status: None   Collection Time: 01/22/21  1:10 AM   Specimen: Nasopharyngeal Swab; Nasopharyngeal(NP) swabs in vial transport medium  Result Value Ref Range Status   SARS Coronavirus 2 by RT PCR NEGATIVE NEGATIVE Final    Comment: (NOTE) SARS-CoV-2 target nucleic acids are NOT DETECTED.  The SARS-CoV-2 RNA is generally detectable in upper respiratory specimens during the acute phase of infection. The lowest concentration of SARS-CoV-2 viral copies this assay can detect is 138 copies/mL. A negative result does not preclude SARS-Cov-2 infection and should not be used as the sole basis for treatment or other patient management decisions. A negative result may occur with  improper specimen collection/handling, submission of specimen other than nasopharyngeal swab, presence of viral mutation(s) within the areas targeted by this assay, and inadequate number of viral copies(<138 copies/mL). A negative result must be combined with clinical observations, patient history, and epidemiological information. The expected result is Negative.  Fact Sheet for Patients:  EntrepreneurPulse.com.au  Fact Sheet for Healthcare Providers:  IncredibleEmployment.be  This test is no t yet approved or cleared by the [place] FDA and  has been authorized for detection and/or diagnosis of SARS-CoV-2 by FDA under an Emergency Use  Authorization (EUA). This EUA will remain  in effect (meaning this test can be used) for the duration of the COVID-19 declaration under Section 564(b)(1) of the Act, 21 U.S.C.section 360bbb-3(b)(1), unless the authorization is terminated  or revoked sooner.       Influenza A by PCR NEGATIVE NEGATIVE Final   Influenza B by PCR NEGATIVE  NEGATIVE Final    Comment: (NOTE) The Xpert Xpress SARS-CoV-2/FLU/RSV plus assay is intended as an aid in the diagnosis of influenza from Nasopharyngeal swab specimens and should not be used as a sole basis for treatment. Nasal washings and aspirates are unacceptable for Xpert Xpress SARS-CoV-2/FLU/RSV testing.  Fact Sheet for Patients: EntrepreneurPulse.com.au  Fact Sheet for Healthcare Providers: IncredibleEmployment.be  This test is not yet approved or cleared by the [place] FDA and has been authorized for detection and/or diagnosis of SARS-CoV-2 by FDA under an Emergency Use Authorization (EUA). This EUA will remain in effect (meaning this test can be used) for the duration of the COVID-19 declaration under Section 564(b)(1) of the Act, 21 U.S.C. section 360bbb-3(b)(1), unless the authorization is terminated or revoked.  Performed at [facility], [place] [address], [place], [place] [address]   MRSA Next Gen by PCR, Nasal     Status: None   Collection Time: 01/22/21  5:20 AM   Specimen: Nasal Mucosa; Nasal Swab  Result Value Ref Range Status   MRSA by PCR Next Gen NOT DETECTED NOT DETECTED Final    Comment: (NOTE) The GeneXpert MRSA Assay (FDA approved for NASAL specimens only), is one component of a comprehensive MRSA colonization surveillance program. It is not intended to diagnose MRSA infection nor to guide or monitor treatment for MRSA infections. Test performance is not FDA approved in patients less than 68 years old. Performed at [facility], [place] [address] [place],   [address]      RN Pressure Injury Documentation: Pressure Injury 01/22/21 Buttocks Bilateral Stage 1 -  Intact skin with non-blanchable redness of a localized area usually over a bony prominence. nonblanching redness, device related (commode) on buttocks (Active)  01/22/21 2130  Location: Buttocks  Location Orientation: Bilateral  Staging: Stage 1 -  Intact skin with non-blanchable redness of a localized area usually over a bony prominence.  Wound Description (Comments): nonblanching redness, device related (commode) on buttocks  Present on Admission: Yes     Estimated body mass index is 24.43 kg/m as calculated from the following:   Height as of 01/06/21: 5\' 7"  (1.702 m).   Weight as of 01/06/21: 70.8 kg.  Malnutrition Type: Malnutrition Characteristics: Nutrition Interventions:    Radiology Studies: CT ABDOMEN PELVIS WO CONTRAST  Result Date: 01/22/2021 CLINICAL DATA:  Acute unspecified abdominal pain EXAM: CT ABDOMEN AND PELVIS WITHOUT CONTRAST TECHNIQUE: Multidetector CT imaging of the abdomen and pelvis was performed following the standard protocol without IV contrast. COMPARISON:  None. FINDINGS: Lower chest: The visualized lung bases are clear bilaterally. Mild coronary artery calcification. Global cardiac size within normal limits. Hepatobiliary: No focal liver abnormality is seen. No gallstones, gallbladder wall thickening, or biliary dilatation. Pancreas: Unremarkable Spleen: Unremarkable Adrenals/Urinary Tract: Adrenal glands are unremarkable. Kidneys are normal, without renal calculi, focal lesion, or hydronephrosis. Bladder is unremarkable. Stomach/Bowel: Stomach is within normal limits. Appendix appears normal. No evidence of bowel wall thickening, distention, or inflammatory changes. Vascular/Lymphatic: Extensive aortoiliac atherosclerotic calcification. No aortic aneurysm. No pathologic adenopathy within the abdomen and pelvis. Reproductive: Uterus and bilateral adnexa are  unremarkable. Other: Small fat containing left inguinal hernia. The rectum is unremarkable. Musculoskeletal: The osseous structures are age-appropriate. No acute bone abnormality. No lytic or blastic bone lesion. IMPRESSION: No acute intra-abdominal pathology identified. No definite radiographic explanation for the patient's reported symptoms. Aortic Atherosclerosis (ICD10-I70.0). Electronically Signed   By: Fidela Salisbury MD   On: 01/22/2021 03:44   CT Head  Wo Contrast  Result Date: 01/21/2021 CLINICAL DATA:  Mental status change EXAM: CT HEAD WITHOUT CONTRAST TECHNIQUE: Contiguous axial images were obtained from the base of the skull through the vertex without intravenous contrast. COMPARISON:  CT brain 04/03/2013 FINDINGS: Brain: No acute territorial infarction, hemorrhage or intracranial mass. Mild atrophy. Nonenlarged ventricles. Minimal patchy white matter hypodensity consistent with chronic small vessel ischemic change. Vascular: Dense vessels.  Scattered carotid vascular calcification Skull: Normal. Negative for fracture or focal lesion. Sinuses/Orbits: No acute finding. Other: None IMPRESSION: 1. No CT evidence for acute intracranial abnormality. 2. Mild atrophy and chronic small vessel ischemic change of the white matter Electronically Signed   By: Donavan Foil M.D.   On: 01/21/2021 22:21   MR BRAIN WO CONTRAST  Result Date: 01/23/2021 CLINICAL DATA:  Mental status change EXAM: MRI HEAD WITHOUT CONTRAST TECHNIQUE: Multiplanar, multiecho pulse sequences of the brain and surrounding structures were obtained without intravenous contrast. COMPARISON:  CT head 01/21/2021 FINDINGS: Brain: Ventricle size and cerebral volume normal for age. Moderate white matter changes with scattered white matter hyperintensities throughout both cerebral hemispheres. Brainstem and cerebellum normal. Negative for hemorrhage or mass. Negative for acute infarct. Vascular: Normal arterial flow voids. Skull and upper cervical  spine: Negative Sinuses/Orbits: Negative Other: None IMPRESSION: No acute abnormality. Moderate white matter changes consistent with chronic microvascular ischemia. Electronically Signed   By: Franchot Gallo M.D.   On: 01/23/2021 15:53   DG Chest Portable 1 View  Result Date: 01/21/2021 CLINICAL DATA:  Altered level of consciousness EXAM: PORTABLE CHEST 1 VIEW COMPARISON:  01/06/2021 FINDINGS: Single frontal view of the chest demonstrates an unremarkable cardiac silhouette. No airspace disease, effusion, or pneumothorax. IMPRESSION: 1. No acute intrathoracic process. Electronically Signed   By: Randa Ngo M.D.   On: 01/21/2021 21:19   EEG adult  Result Date: 01/22/2021 Lora Havens, MD     01/22/2021  3:24 PM Patient Name: [name] MRN: [id number] Epilepsy Attending: Lora Havens Referring Physician/Provider: Dr Irene Pap Date: 01/22/2021 Duration: 23.18 mins Patient history: 80yo F with ams. EEG to evaluate for seizure Level of alertness: Awake, asleep AEDs during EEG study: None Technical aspects: This EEG study was done with scalp electrodes positioned according to the 10-20 International system of electrode placement. Electrical activity was acquired at a sampling rate of 500Hz  and reviewed with a high frequency filter of 70Hz  and a low frequency filter of 1Hz . EEG data were recorded continuously and digitally stored. Description: The posterior dominant rhythm consists of 8-9Hz  activity of moderate voltage (25-35 uV) seen predominantly in posterior head regions, symmetric and reactive to eye opening and eye closing. Sleep was characterized by vertex waves, sleep spindles (12 to 14 Hz), maximal frontocentral region. Physiologic photic driving was seen during photic stimulation.  Hyperventilation was not performed.   IMPRESSION: This study is within normal limits. No seizures or epileptiform discharges were seen throughout the recording. Lora Havens   ECHOCARDIOGRAM  COMPLETE  Result Date: 01/22/2021    ECHOCARDIOGRAM REPORT   Patient Name:   DAILIN SOSNOWSKI [facility] Date of Exam: 01/22/2021 Medical Rec #:  [id number]    Height:       67.0 in Accession #:    [id number]   Weight:       156.0 lb Date of Birth:  1941/06/02    BSA:          1.820 m Patient Age:    33 years     BP:  83/61 mmHg Patient Gender: F            HR:           80 bpm. Exam Location:  Inpatient Procedure: 2D Echo, Cardiac Doppler and Color Doppler Indications:    Elevated Troponin  History:        Patient has prior history of Echocardiogram examinations, most                 recent 01/08/2018. Signs/Symptoms:Murmur and Dyspnea; Risk                 Factors:Hypertension and Dyslipidemia. CKD. GERD.  Sonographer:    Jonelle Sidle Dance Referring Phys: [id number] [place]  1. Murmur secondary to hyperdynamic LV contraction with turbulence through outflow tract. There is no significant outflow tract gradient, however.  2. Left ventricular ejection fraction, by estimation, is >75%. The left ventricle has hyperdynamic function. The left ventricle has no regional wall motion abnormalities. Left ventricular diastolic parameters are consistent with Grade I diastolic dysfunction (impaired relaxation).  3. Right ventricular systolic function is normal. The right ventricular size is normal. There is normal pulmonary artery systolic pressure. The estimated right ventricular systolic pressure is 93.8 mmHg.  4. Left atrial size was mildly dilated.  5. The mitral valve is normal in structure. Trivial mitral valve regurgitation. No evidence of mitral stenosis.  6. The aortic valve is normal in structure. Aortic valve regurgitation is not visualized. No aortic stenosis is present.  7. The inferior vena cava is normal in size with greater than 50% respiratory variability, suggesting right atrial pressure of 3 mmHg. FINDINGS  Left Ventricle: Left ventricular ejection fraction, by estimation, is >75%. The left ventricle has  hyperdynamic function. The left ventricle has no regional wall motion abnormalities. The left ventricular internal cavity size was normal in size. There is no left ventricular hypertrophy. Left ventricular diastolic parameters are consistent with Grade I diastolic dysfunction (impaired relaxation). Right Ventricle: The right ventricular size is normal. No increase in right ventricular wall thickness. Right ventricular systolic function is normal. There is normal pulmonary artery systolic pressure. The tricuspid regurgitant velocity is 2.16 m/s, and  with an assumed right atrial pressure of 3 mmHg, the estimated right ventricular systolic pressure is 18.2 mmHg. Left Atrium: Left atrial size was mildly dilated. Right Atrium: Right atrial size was normal in size. Pericardium: There is no evidence of pericardial effusion. Mitral Valve: The mitral valve is normal in structure. Trivial mitral valve regurgitation. No evidence of mitral valve stenosis. Tricuspid Valve: The tricuspid valve is normal in structure. Tricuspid valve regurgitation is trivial. No evidence of tricuspid stenosis. Aortic Valve: The aortic valve is normal in structure. Aortic valve regurgitation is not visualized. No aortic stenosis is present. Pulmonic Valve: The pulmonic valve was normal in structure. Pulmonic valve regurgitation is not visualized. No evidence of pulmonic stenosis. Aorta: The aortic root is normal in size and structure. Venous: The inferior vena cava is normal in size with greater than 50% respiratory variability, suggesting right atrial pressure of 3 mmHg. IAS/Shunts: No atrial level shunt detected by color flow Doppler.  LEFT VENTRICLE PLAX 2D LVIDd:         4.40 cm LVIDs:         3.20 cm LV PW:         0.90 cm LV IVS:        1.10 cm LVOT diam:     1.60 cm LV SV:  53 LV SV Index:   29 LVOT Area:     2.01 cm  RIGHT VENTRICLE             IVC RV Basal diam:  2.90 cm     IVC diam: 1.60 cm RV S prime:     14.40 cm/s TAPSE  (M-mode): 1.6 cm LEFT ATRIUM             Index       RIGHT ATRIUM           Index LA diam:        3.80 cm 2.09 cm/m  RA Area:     10.80 cm LA Vol (A2C):   68.2 ml 37.48 ml/m RA Volume:   22.80 ml  12.53 ml/m LA Vol (A4C):   73.6 ml 40.45 ml/m LA Biplane Vol: 72.8 ml 40.01 ml/m  AORTIC VALVE LVOT Vmax:   138.50 cm/s LVOT Vmean:  90.700 cm/s LVOT VTI:    0.264 m  AORTA Ao Root diam: 3.00 cm Ao Asc diam:  2.60 cm MITRAL VALVE               TRICUSPID VALVE MV Area (PHT): 2.83 cm    TR Peak grad:   18.7 mmHg MV Decel Time: 268 msec    TR Vmax:        216.00 cm/s MV E velocity: 77.10 cm/s MV A velocity: 99.10 cm/s  SHUNTS MV E/A ratio:  0.78        Systemic VTI:  0.26 m                            Systemic Diam: 1.60 cm Candee Furbish MD Electronically signed by Candee Furbish MD Signature Date/Time: 01/22/2021/11:37:28 AM    Final     Scheduled Meds:  amLODipine  5 mg Oral Daily   atenolol  25 mg Oral Daily   pregabalin  75 mg Oral Daily   primidone  25 mg Oral BID   simvastatin  40 mg Oral QHS   traZODone  50 mg Oral QHS   Continuous Infusions:   LOS: 1 day   Kerney Elbe, DO [facility] PAGER is on AMION  If 7PM-7AM, please contact night-coverage [email or web address]

## 2021-01-23 NOTE — Evaluation (Signed)
Occupational Therapy Evaluation Patient Details Name: [name] MRN: [id number] DOB: 1940/08/02 Today's Date: 01/23/2021    History of Present Illness The pt is an 80 yo female presenting 7/15 with AMS. Upon workup, pt with multifocal acute metabolic encephalopathy, elevated troponins secondary to suspected demand ischemia, and SIRS. EEG (-). PMH includes: essential tremors, fibromyalgia, HTN, HLD, CKD, L TKA, and insomnia.   Clinical Impression   PTA patient was living alone in a private residence and was grossly I with ADLs/IADLs without AD. Patient drives and manages her medication independently. Patient currently functioning below baseline demonstrating observed ADLs including 3/3 parts of toileting task and ADL transfers with Min guard, use of RW and more than increased time. Patient also limited by deficits listed below including decreased cognition, decreased safety awareness, and mild impulsivity and would benefit from continued acute OT services in prep for safe d/c home. Recommendation for initial 24hr supervision/assist and HHOT.      Follow Up Recommendations  Home health OT;Supervision/Assistance - 24 hour    Equipment Recommendations  3 in 1 bedside commode    Recommendations for Other Services       Precautions / Restrictions Precautions Precautions: Fall Precaution Comments: incontinent of BM      Mobility Bed Mobility Overal bed mobility: Needs Assistance Bed Mobility: Supine to Sit;Sit to Supine     Supine to sit: Min guard     General bed mobility comments: Min guard for supine to EOB with HOB elevated; attempts to get out of bed prior to instruction from this therapist.    Transfers Overall transfer level: Needs assistance Equipment used: Rolling walker (2 wheeled) Transfers: Sit to/from [facility] Sit to Stand: Min guard Stand pivot transfers: Min guard       General transfer comment: Min guard for sit to stand from EOB to RW  and Min guard for stand-pivot to [facility]. Requires more than increased time.    Balance Overall balance assessment: Needs assistance Sitting-balance support: No upper extremity supported;Feet supported Sitting balance-Leahy Scale: Good     Standing balance support: Bilateral upper extremity supported Standing balance-Leahy Scale: Fair Standing balance comment: Able to maintain static standing balance with unilateral UE support on RW during hygiene/clothing management in standing. Reliant on BUE support on RW with dynamic balance.                           ADL either performed or assessed with clinical judgement   ADL Overall ADL's : Needs assistance/impaired                         Toilet Transfer: Min Art therapist Details (indicate cue type and reason): Min guard to [facility] with increased time/effort. Toileting- Clothing Manipulation and Hygiene: Minimal assistance;Sit to/from stand Toileting - Clothing Manipulation Details (indicate cue type and reason): Min A for hygiene/clothing management for safety and thoroughness.       General ADL Comments: Patient greatly limited by AMS and generalized weakness. Requires increased time/effort for bed mobility and ADLs.     Vision Baseline Vision/History: Wears glasses Wears Glasses: Reading only Patient Visual Report: No change from baseline Vision Assessment?: No apparent visual deficits     Perception     Praxis      Pertinent Vitals/Pain Pain Assessment: No/denies pain     Hand Dominance Right   Extremity/Trunk Assessment Upper Extremity Assessment Upper Extremity Assessment: Generalized weakness  Lower Extremity Assessment Lower Extremity Assessment: Defer to PT evaluation   Cervical / Trunk Assessment Cervical / Trunk Assessment: Normal   Communication Communication Communication: No difficulties   Cognition Arousal/Alertness: Lethargic Behavior During Therapy: WFL for tasks  assessed/performed;Flat affect Overall Cognitive Status: Within Functional Limits for tasks assessed                                 General Comments: Patient A&O to person, place and month/year. Reports being in the hospital secondary to MI and CVA although she was admitted for AMS. Able to recall 2/3 words on BIMs after 3 minute. Mild impulsivity standing several times prior to instruction to do so.   General Comments  VSS on RA.    Exercises     Shoulder Instructions      Home Living Family/patient expects to be discharged to:: Private residence Living Arrangements: Alone Available Help at Discharge: Family;Available PRN/intermittently (daughter works nights as an Therapist, sports) Type of Home: House Home Access: Level entry     [place]: One level     Bathroom Shower/Tub: Teacher, early years/pre: [place]: Environmental consultant - 4 wheels;Cane - single point;Shower seat;Grab bars - tub/shower          Prior Functioning/Environment Level of Independence: Independent        Comments: I w/ ADLs/IADLs without AD; drives; active in her community        OT Problem List: Impaired balance (sitting and/or standing);Decreased cognition      OT Treatment/Interventions: Self-care/ADL training;Therapeutic exercise;Energy conservation;DME and/or AE instruction;Therapeutic activities;Cognitive remediation/compensation;Patient/family education;Balance training    OT Goals(Current goals can be found in the care plan section) Acute Rehab OT Goals Patient Stated Goal: return home OT Goal Formulation: With patient Time For Goal Achievement: 02/06/21 Potential to Achieve Goals: Good ADL Goals Pt Will Perform Grooming: with supervision;standing Pt Will Perform Upper Body Dressing: Independently Pt Will Perform Lower Body Dressing: with supervision;sit to/from stand Pt Will Transfer to Toilet: with supervision;ambulating Pt Will Perform Toileting - Clothing  Manipulation and hygiene: with supervision;sit to/from stand Additional ADL Goal #1: Patient will score <4/28 on SBT indicating increased cognition.  OT Frequency: Min 2X/week   Barriers to D/C: Decreased caregiver support  Lives alone       Co-evaluation              AM-PAC OT "6 Clicks" Daily Activity     Outcome Measure Help from another person eating meals?: None Help from another person taking care of personal grooming?: A Little Help from another person toileting, which includes using toliet, bedpan, or urinal?: A Little Help from another person bathing (including washing, rinsing, drying)?: A Little Help from another person to put on and taking off regular upper body clothing?: A Little Help from another person to put on and taking off regular lower body clothing?: A Little 6 Click Score: 19   End of Session Equipment Utilized During Treatment: Gait belt;Rolling walker  Activity Tolerance: Patient tolerated treatment well Patient left: in chair;with call bell/phone within reach;with chair alarm set  OT Visit Diagnosis: Unsteadiness on feet (R26.81);Muscle weakness (generalized) (M62.81)                Time: 7510-2585 OT Time Calculation (min): 35 min Charges:  OT General Charges $OT Visit: 1 Visit OT Evaluation $OT Eval Moderate Complexity: 1 Mod OT Treatments $Self Care/Home Management :  8-22 mins  Vonette Grosso H. OTR/L Supplemental OT, Department of rehab services [phone]  Shamra Bradeen R H. 01/23/2021, 1:37 PM

## 2021-01-23 NOTE — Plan of Care (Signed)
  Problem: Education: Goal: Knowledge of General Education information will improve Description: Including pain rating scale, medication(s)/side effects and non-pharmacologic comfort measures Outcome: Progressing   Problem: Nutrition: Goal: Adequate nutrition will be maintained Outcome: Progressing   

## 2021-01-24 DIAGNOSIS — G3184 Mild cognitive impairment, so stated: Secondary | ICD-10-CM

## 2021-01-24 DIAGNOSIS — R410 Disorientation, unspecified: Secondary | ICD-10-CM

## 2021-01-24 LAB — COMPREHENSIVE METABOLIC PANEL
ALT: 18 U/L (ref 0–44)
AST: 35 U/L (ref 15–41)
Albumin: 3.3 g/dL — ABNORMAL LOW (ref 3.5–5.0)
Alkaline Phosphatase: 70 U/L (ref 38–126)
Anion gap: 8 (ref 5–15)
BUN: 8 mg/dL (ref 8–23)
CO2: 28 mmol/L (ref 22–32)
Calcium: 8.5 mg/dL — ABNORMAL LOW (ref 8.9–10.3)
Chloride: 99 mmol/L (ref 98–111)
Creatinine, Ser: 0.76 mg/dL (ref 0.44–1.00)
GFR, Estimated: >60 mL/min (ref >60)
Glucose, Bld: 124 mg/dL — ABNORMAL HIGH (ref 70–99)
Potassium: 3.2 mmol/L — ABNORMAL LOW (ref 3.5–5.1)
Sodium: 135 mmol/L (ref 135–145)
Total Bilirubin: 0.3 mg/dL (ref 0.3–1.2)
Total Protein: 5.8 g/dL — ABNORMAL LOW (ref 6.5–8.1)

## 2021-01-24 LAB — CBC WITH DIFFERENTIAL/PLATELET
Abs Immature Granulocytes: 0.02 10*3/uL (ref 0.00–0.07)
Basophils Absolute: 0 10*3/uL (ref 0.0–0.1)
Basophils Relative: 1 %
Eosinophils Absolute: 0 10*3/uL (ref 0.0–0.5)
Eosinophils Relative: 1 %
HCT: 39.5 % (ref 36.0–46.0)
Hemoglobin: 13.4 g/dL (ref 12.0–15.0)
Immature Granulocytes: 0 %
Lymphocytes Relative: 18 %
Lymphs Abs: 1 10*3/uL (ref 0.7–4.0)
MCH: 32.8 pg (ref 26.0–34.0)
MCHC: 33.9 g/dL (ref 30.0–36.0)
MCV: 96.6 fL (ref 80.0–100.0)
Monocytes Absolute: 0.5 10*3/uL (ref 0.1–1.0)
Monocytes Relative: 9 %
Neutro Abs: 4 10*3/uL (ref 1.7–7.7)
Neutrophils Relative %: 71 %
Platelets: 130 10*3/uL — ABNORMAL LOW (ref 150–400)
RBC: 4.09 MIL/uL (ref 3.87–5.11)
RDW: 12.8 % (ref 11.5–15.5)
WBC: 5.7 10*3/uL (ref 4.0–10.5)
nRBC: 0 % (ref 0.0–0.2)

## 2021-01-24 LAB — GASTROINTESTINAL PANEL BY PCR, STOOL (REPLACES STOOL CULTURE)

## 2021-01-24 LAB — MAGNESIUM: Magnesium: 1.9 mg/dL (ref 1.7–2.4)

## 2021-01-24 LAB — C DIFFICILE QUICK SCREEN W PCR REFLEX
C Diff antigen: NEGATIVE
C Diff interpretation: NOT DETECTED
C Diff toxin: NEGATIVE

## 2021-01-24 LAB — PHOSPHORUS: Phosphorus: 2.8 mg/dL (ref 2.5–4.6)

## 2021-01-24 LAB — VITAMIN B12: Vitamin B-12: 198 pg/mL (ref 180–914)

## 2021-01-24 LAB — AMMONIA: Ammonia: 25 umol/L (ref 9–35)

## 2021-01-24 LAB — RPR: RPR Ser Ql: NONREACTIVE

## 2021-01-24 MED ORDER — LOPERAMIDE HCL 2 MG PO CAPS
2.0000 mg | ORAL_CAPSULE | ORAL | Status: DC | PRN
  Administered 2021-01-25: 2 mg via ORAL
  Filled 2021-01-24: qty 1

## 2021-01-24 MED ORDER — VITAMIN B-12 1000 MCG PO TABS
500.0000 ug | ORAL_TABLET | Freq: Every day | ORAL | Status: DC
  Administered 2021-01-27: 500 ug via ORAL
  Filled 2021-01-24: qty 1

## 2021-01-24 MED ORDER — CYANOCOBALAMIN 1000 MCG/ML IJ SOLN
1000.0000 ug | Freq: Every day | INTRAMUSCULAR | Status: AC
  Administered 2021-01-24 – 2021-01-26 (×3): 1000 ug via INTRAMUSCULAR
  Filled 2021-01-24 (×3): qty 1

## 2021-01-24 MED ORDER — POTASSIUM CHLORIDE 10 MEQ/100ML IV SOLN
10.0000 meq | INTRAVENOUS | Status: AC
  Administered 2021-01-24 (×4): 10 meq via INTRAVENOUS
  Filled 2021-01-24 (×3): qty 100

## 2021-01-24 MED ORDER — POTASSIUM CHLORIDE 20 MEQ PO PACK
40.0000 meq | PACK | Freq: Once | ORAL | Status: AC
  Administered 2021-01-24: 40 meq via ORAL
  Filled 2021-01-24: qty 2

## 2021-01-24 NOTE — TOC Initial Note (Signed)
Transition of Care [facility]) - Initial/Assessment Note    Patient Details  Name: [name] MRN: [id number] Date of Birth: [birth year]  Transition of Care [facility]) CM/SW Contact:    Bartholomew Crews, RN Phone Number: [phone] 01/24/2021, 2:10 PM  Clinical Narrative:                  Spoke with patient and daughter, [name], at the bedside. PTA patient home alone. Independent with ADLs and still driving. Ideally patient would like to return to her home, but she is agreeable to inpatient rehab or skilled nursing facility for short term. Discussed recommendations for [facility] PT over weekend - if patient discharges with home health, family to arrange 24/7 coverage for a couple weeks until [name] is prepared for her mom to move in with her. Caregiver resources provided and suggested previewing SNFs and [place] on [email or web address]. Demographics and PCP verified. Family to provide transportation home at discharge.   OT updated current recommendations for CIR with PT pending.   TOC following.  Expected Discharge Plan: IP Rehab Facility Barriers to Discharge: Continued Medical Work up   Patient Goals and CMS Choice Patient states their goals for this hospitalization and ongoing recovery are:: return home with daughter CMS Medicare.gov Compare Post Acute Care list provided to:: Patient Choice offered to / list presented to : Patient, Adult Children  Expected Discharge Plan and Services Expected Discharge Plan: [place] In-house Referral: Clinical Social Work Discharge Planning Services: CM Consult   Living arrangements for the past 2 months: Single Family Home                 DME Arranged: N/A DME Agency: NA       HH Arranged: NA [place] Agency: NA        Prior Living Arrangements/Services Living arrangements for the past 2 months: [place] with:: Self Patient language and need for interpreter reviewed:: Yes        Need for Family Participation in Patient Care: Yes  (Comment) Care giver support system in place?: Yes (comment) Current home services: DME (walker, 3N1, shower stool) Criminal Activity/Legal Involvement Pertinent to Current Situation/Hospitalization: No - Comment as needed  Activities of Daily Living   ADL Screening (condition at time of admission) Patient's cognitive ability adequate to safely complete daily activities?: Yes Is the patient deaf or have difficulty hearing?: No Does the patient have difficulty seeing, even when wearing glasses/contacts?: No Does the patient have difficulty concentrating, remembering, or making decisions?: Yes Does the patient have difficulty dressing or bathing?: No Does the patient have difficulty walking or climbing stairs?: Yes  Permission Sought/Granted Permission sought to share information with : Family Supports    Share Information with NAME: [name]     Permission granted to share info w Relationship: daughter  Permission granted to share info w Contact Information: Work Mobile  [name],[name] Daughter [phone]  Emotional Assessment Appearance:: Appears stated age Attitude/Demeanor/Rapport: Engaged Affect (typically observed): Accepting Orientation: : Oriented to Self, Oriented to Place, Oriented to  Time, Oriented to Situation Alcohol / Substance Use: Not Applicable Psych Involvement: Yes (comment)  Admission diagnosis:  Hyponatremia [E87.1] Acute kidney injury (nontraumatic) (HCC) [P95.0] Metabolic acidosis, increased anion gap [E87.2] Altered mental status, unspecified altered mental status type [R41.82] AMS (altered mental status) [R41.82] Patient Active Problem List   Diagnosis Date Noted   Pressure injury of skin 01/23/2021   AMS (altered mental status) 01/22/2021   OSA (obstructive sleep apnea) 08/23/2020  History of snoring 07/27/2020   Dysphonia of essential tremor 06/21/2020   Disabling essential tremor 06/21/2020   Chronic insomnia 06/21/2020   Status post  total knee replacement, left 04/13/2017   Vitamin D deficiency 04/12/2017   History of chronic kidney disease 04/12/2017   Osteopenia of multiple sites 04/06/2017   Fibromyalgia syndrome 09/11/2016   Other fatigue 09/11/2016   Primary insomnia 09/11/2016   History of osteopenia 09/11/2016   OA (osteoarthritis) of knee 11/08/2015   Essential hypertension, benign 12/16/2013   Pure hypercholesterolemia 12/16/2013   PCP:  Kelton Pillar, MD Pharmacy:   [facility] [id number] - [place], [place] [address] Phone: [phone] Fax: [phone]  [place] ([place], [place] [address] Phone: [phone] Fax: [place] [address] [place] [address] [place] [address] Phone: [phone] Fax: [phone]     Social Determinants of Health ([place]) Interventions    Readmission Risk Interventions No flowsheet data found.

## 2021-01-24 NOTE — Consult Note (Signed)
Neurology Consultation  Reason for Consult: Encephalopathy.  Referring Physician: O. Sheik, MD.   CC: confusion on presentation.   History is obtained from: daughter, chart, and patient.   HPI: [name] is a 80 y.o. female with PMHx of BCCa, benign essential tremor recently started on Primidone, CKD, HTN, fibromyalgia, OA, HLD, and GERD who presented to ED 2 days ago secondary to confusion x 24 hours and being found on the commode. On 01/20/21, at 0700 hours, daughter who is a Therapist, sports, found her to be confused and not answering questions properly. On 01/21/21, daughter stopped by to check on patient, and patient did not answer the door, so daughter left. On recheck, patient again did not answer her phone, or the door, so Police were called and patient was found on the commode for unknown time frame, confused, and non verbal.   En route, patient's mental status began to clear and states she is back to her normal self. Per daughter, patient has made poor decisions on and off since January 2022 and daughter is concerned it could be medication related. Daughter thinks patient took more than prescribed Trazodone on 01/20/21. Daughter reports changes of moods. Patient is frustrated by daughter speaking for her.   Upon arrival, patient fit SIRS criteria, and empiric antibiotics were started. However, no etiology of infection has been found, so antibiotics discontinued. She had multiple metabolic derangements which are being treated. AKI on known CKD. Her AMS quickly dissipated. She also reported diarrhea with decreased appetite, 2 days prior to admission.   On 08/13/20, patient saw Dr. Brett Fairy for tremor and chronic insomnia. She was diagnosed with OSA and was to start CPAP. Patient was placed on Primidone for tremor.    Neurology was asked to consult due to confusion.   ROS: A robust ROS was performed and is negative except as noted in the HPI.   Past Medical History:  Diagnosis Date   BCC (basal cell  carcinoma of skin) 03/27/2006   Above Left Upper Lip (MOH's)   Benign essential tremor    Chronic kidney disease    Essential hypertension, benign    Fibromyalgia    GERD (gastroesophageal reflux disease)    Heart murmur    Hemorrhoids    Hyponatremia    secondary to hctz-recurrent March 2010   Low back pain    facet joint pain   Occipital neuralgia    Osteoarthritis    Osteopenia    Pure hypercholesterolemia 12/16/2013   SCCA (squamous cell carcinoma) of skin 07/15/2002   Left Inner Cheek(in situ)   SCCA (squamous cell carcinoma) of skin 05/06/2004   Right Wing Nose(in situ) -Cx3,5FU   SCCA (squamous cell carcinoma) of skin 01/09/2011   Above Right Upper Lip(in situ)-Cx3   SCCA (squamous cell carcinoma) of skin 01/05/2014   Left Temple(in situ)-Cx3,5FU   SCCA (squamous cell carcinoma) of skin 09/30/2014   Right Wrist(in situ)-tx p bx   Shortness of breath dyspnea    Superficial basal cell carcinoma (BCC) 08/17/1982   Right Side Upper Lip   Tuberculosis    as a child    Family History  Problem Relation Age of Onset   Heart disease Father    Prostate cancer Father    Heart disease Mother    Cancer - Ovarian Mother    Congestive Heart Failure Mother    Stroke Sister     Social History:   reports that she has never smoked. She has never used smokeless tobacco. She  reports current alcohol use. She reports that she does not use drugs.  Medications  Current Facility-Administered Medications:    0.9 %  sodium chloride infusion, , Intravenous, Continuous, Raiford Noble [place], [place], Last Rate: 75 mL/hr at 01/23/21 1733, New Bag at 01/23/21 1733   acetaminophen (TYLENOL) tablet 650 mg, 650 mg, Oral, Q6H PRN, Hall, Carole N, DO   amLODipine (NORVASC) tablet 5 mg, 5 mg, Oral, Daily, Hall, Carole N, DO, 5 mg at 01/24/21 0954   atenolol (TENORMIN) tablet 25 mg, 25 mg, Oral, Daily, Hall, Carole N, DO, 25 mg at 01/24/21 0954   enoxaparin (LOVENOX) injection 40 mg, 40 mg,  Subcutaneous, Q24H, Sheikh, Omair [place], DO, 40 mg at 01/23/21 2203   metoprolol tartrate (LOPRESSOR) injection 5 mg, 5 mg, Intravenous, Once PRN, Langston Masker, Carola Rhine, MD   ondansetron [facility]) injection 4 mg, 4 mg, Intravenous, Q6H PRN, [place] Crane, Carole N, DO   pregabalin (LYRICA) capsule 75 mg, 75 mg, Oral, Daily, Irene Pap N, DO, 75 mg at 01/24/21 0233   primidone (MYSOLINE) tablet 25 mg, 25 mg, Oral, BID, Hall, Carole N, DO, 25 mg at 01/24/21 0954   simvastatin (ZOCOR) tablet 40 mg, 40 mg, Oral, QHS, Hall, Carole N, DO, 40 mg at 01/23/21 2203   traMADol (ULTRAM) tablet 100 mg, 100 mg, Oral, BID PRN, Kayleen Memos, DO  Exam: Current vital signs: BP (!) 152/58   Pulse 86   Temp 98.4 F (36.9 C) (Oral)   Resp 18   Ht 5' 4.8" (1.646 m)   Wt 70.6 kg   SpO2 96%   BMI 26.06 kg/m  Vital signs in last 24 hours: Temp:  [98.3 F (36.8 C)-98.4 F (36.9 C)] 98.4 F (36.9 C) (07/18 0306) Pulse Rate:  [66-86] 86 (07/18 0954) Resp:  [18] 18 (07/17 1548) BP: (119-152)/(56-66) 152/58 (07/18 0954) SpO2:  [96 %-98 %] 96 % (07/18 0713) Weight:  [70.6 kg] 70.6 kg (07/17 1716)  PE: GENERAL: Well appearing female who appears younger than stated age. Awake, alert in NAD. She is sitting on side of bed eating her lunch.  HEENT: normocephalic and atraumatic. LUNGS - Normal respiratory effort.  CV - RRR on tele. ABDOMEN - Soft, nontender. Ext: warm, well perfused. Essential tremor noted to right hand with eating.  Psych: affect light.   NEURO:  Mental Status: Alert and oriented x 3. Follows all commands. Able to tell NP how many quarters are in $2.75 with a very quick response.  Speech/Language: speech is without dysarthria or aphasia.  Naming, repetition, fluency, and comprehension intact.  Cranial Nerves:  II: PERRL 3 mm/brisk.  III, IV, VI: EOMI. Lid elevation symmetric and full.  V: sensation is intact and symmetrical to face.  VII: Smile is symmetrical. Able to puff cheeks and raise  eyebrows.  VIII:hearing intact to voice. IX, X: palate elevation is symmetric. Phonation normal.  XI: normal sternocleidomastoid and trapezius muscle strength. IDH:WYSHUO is symmetrical without fasciculations.   Motor:  5/5 strength to all muscle groups tested.  Tone is normal. Bulk is normal.  Sensation- Intact to light touch bilaterally in all four extremities. Extinction absent to light touch to DSS.  Coordination: FTN intact bilaterally.  Gait- deferred.  Labs I have reviewed labs in epic and the results pertinent to this consultation are: Vitamin B12 198. Ammonia 25.   CBC    Component Value Date/Time   WBC 5.7 01/24/2021 0905   RBC 4.09 01/24/2021 0905   HGB 13.4 01/24/2021 0905  HCT 39.5 01/24/2021 0905   PLT 130 (L) 01/24/2021 0905   MCV 96.6 01/24/2021 0905   MCH 32.8 01/24/2021 0905   MCHC 33.9 01/24/2021 0905   RDW 12.8 01/24/2021 0905   LYMPHSABS 1.0 01/24/2021 0905   MONOABS 0.5 01/24/2021 0905   EOSABS 0.0 01/24/2021 0905   BASOSABS 0.0 01/24/2021 0905    CMP     Component Value Date/Time   NA 135 01/24/2021 0905   K 3.2 (L) 01/24/2021 0905   CL 99 01/24/2021 0905   CO2 28 01/24/2021 0905   GLUCOSE 124 (H) 01/24/2021 0905   BUN 8 01/24/2021 0905   CREATININE 0.76 01/24/2021 0905   CREATININE 1.10 (H) 05/03/2016 1417   CALCIUM 8.5 (L) 01/24/2021 0905   PROT 5.8 (L) 01/24/2021 0905   ALBUMIN 3.3 (L) 01/24/2021 0905   AST 35 01/24/2021 0905   ALT 18 01/24/2021 0905   ALKPHOS 70 01/24/2021 0905   BILITOT 0.3 01/24/2021 0905   GFRNONAA >60 01/24/2021 0905   GFRNONAA 49 (L) 05/03/2016 1417   GFRAA 56 (L) 05/03/2016 1417   Imaging  CT head No CT evidence for acute intracranial abnormality. Mild atrophy and chronic small vessel ischemic change of the white matter.  MRI brain No acute abnormality. Moderate white matter changes consistent with chronic microvascular ischemia.  EEG without seizures or epileptiform discharges.     Assessment: 80  yo female who presented after being found confused and non verbal on the commode. Her mental status has quickly returned to her normal. In review of her medications, there are some offenders for elderly persons. EEG normal which is reassuring. Her encephalopathy is likely multifactorial with dehydration, ? proper use of home medications, AKI on CKD, and SIRS.   Impression: -multifactorial toxic and metabolic encephalopathy, now cleared.   Recommendations/Plan:  -Agree with psychiatric consult.  -Continue to correct metabolic derangements as you are doing.  -Vitamin B12 level is low for neurological standard. Start supplementation.  -Avoid medications on the Beer's List for the elderly.  [email or web address] -Try to decrease dose or discontinue Lyrica.  -Decrease dose or wean Ultram.  -Try to stop Trazodone in favor of Melatonin with outpatient f/up with neurology.  -Recommend full Neuro cognitive testing.   Neurology will be available for questions.   Pt seen by Clance Boll, NP/Neuro and later by MD. Note/plan to be edited by MD as needed.  Pager: [phone]  Neurology Attending Attestation   I examined the patient and discussed plan with Ms. Lionel December NP. Above note has been edited by me to reflect my findings and recommendations. Patient is lucid and oriented on exam and can calculate change. Delayed recall is 1/3. She may have MCI which can be further worked up as o/p with formal neurocog testing as psych suggested. She has been having some mood swings over the past several months, at times happy with more energy and at times depressed. This is felt to be unrelated to her primidone which she may continue for essential tremor. Will defer to psych for further mgmt of mood issues. There are no signs of Parkinsonism on my exam. D/w patient and daughter at bedside whose questions were addressed to their satisfaction. Neuro to sign off, but please re-engage if additional  questions arise.   Su Monks, MD [facility] [phone]   If 7pm- 7am, please page neurology on call as listed in [place].

## 2021-01-24 NOTE — Progress Notes (Signed)
PROGRESS NOTE    [name]  WFU:[id number] DOB: 1940-08-31 DOA: 01/21/2021 PCP: Kelton Pillar, MD   Brief Narrative:  The patient is a elderly 80 year old Caucasian female with a past medical history significant for but not limited to tremors who was started on primidone, history of fibromyalgia, essential hypertension, hyperlipidemia, chronic insomnia as well as other comorbidities who presented to the [facility] emergency room via EMS after she was found to be confused on the commode.  Most of the history was obtained from the patient's daughter at bedside and the patient's daughter noted the patient had mild confusion around midnight on Thursday while talking the phone with her mother.  Her daughter works the night shift and called and noted that her answers were out of character for around 7 AM that morning she noted clear confusion was worse.  Patient was answering questions inappropriately and did not want to see her.  Recently she lost position of a [place] that she recently adopted and was sad about it.  On Thursday evening at 10:30 PM the patient's daughter became concerned and came to the mother's house however mother would not open the door and she left.  The following day on Friday, the day of presentation, the patient would not answer her phone call from her daughter so the daughter went to the patient's house with officers from the police department on 2:02 PM.  They found her sitting on the commode.  It appeared that she may been sitting there for hours.  There is no urine or stool in the commode.  Patient was nonverbal and EMS was activated she is brought to the ED for further evaluation.  Her mentation gradually improved in the ED and returned to baseline and head CT was negative and urinalysis was negative.  Troponins were elevated and uptrending.  Due to concern for sepsis and patient received IV fluid bolus and started on antibiotics with IV Rocephin.  She has had poor  appetite for last few days and [facility] was asked to admit this patient.  **Interim History Daughter states that patient has been having significant diarrhea since prior to admission.  We will work-up her diarrhea and obtain C. difficile as well as a GI pathogen panel.  Patient daughter also thinks that she is not back to her baseline even though she was awake and alert and oriented x3.  We will obtain an MRI for further evaluation.  EEG was unremarkable.  PT OT recommending home health.  Electrolytes are low in the setting of her diarrhea and will be replete.  We will continue monitor carefully and see if her mentation improves.  Her leukocytosis has improved as well as her AKI.  Her mentation is improved significantly however daughter thinks that she still has periods of significant confusion and requested a psych evaluation and neurology evaluation prior to discharge.  PT initially recommended home health but patient does not have a safe discharge disposition as she does not have 24-hour care and supervision so she may end up going to a SNF prior to discharge for rehabilitation.   Assessment & Plan:   Active Problems:   AMS (altered mental status)   Pressure injury of skin    Acute metabolic encephalopathy suspect multifactorial, improving but could be in the setting of Progressive decline /Early Dementia  -Appeared to be back to her baseline mentation as she was awake and alert and oriented x3 however daughter does not think she is back to her baseline  yet as she has been significantly confused still and having trouble following commands; after further discussion with the patient's daughter the daughter thinks that she may have overdosed on her trazodone given her recent stressful event with her dog. -Daughter thinks that she is much improved today but still has some confusion; Consulted Psych and Neurology for further Evaluation -Psych feels as she has some fluctuations in cognitive impairment and  intermittent periods of confusion and they are recommending outpatient neuropsych consult for further evaluation.  Psych is recommended skilled nursing facility with emphasis on neurocognitive rehabilitation and [facility] has been consulted for outpatient geriatric psychiatry referral to include calling behavioral health as an outpatient for possible medication management -Patient's daughter has felt that she has had periods of mania and significant depression.  Psychiatry recommended outpatient evaluation -Neurology consulted for further evaluation for medications given the primidone as this could be early Parkinson's dementia -Continue IV fluid hydration as below  -CT head non-acute -UA negative for pyuria but did show rare bacteria, negative leukocytes, negative nitrites, 0-5 squamous epithelial cells, 0-5 RBCs per high-power field, 0-5 WBCs -Obtain TSH and was 0.793 -Hypovolemic on exam, continue IV fluid hydration and her AKI is much improved -Reorient if needed -EEG was ordered and showed that the study was within normal limits -Because of persistent confusion per the daughter we will obtain an MRI of the brain without contrast -Daughter now states that the patient is having significant diarrhea that was not mentioned prior to admission -Continue to Monitor Closely and if mentation does not improve even after getting an MRI we will consult neurology for further evaluation and possibly psychiatry to see if this is a behavioral thing -MRI Done and showed "No acute abnormality. Moderate white matter changes consistent with chronic microvascular ischemia." -Check B12 and was 198 and low so will start repletion neurology did not give the patient IM subcu cyanocobalamin 1000 mcg daily for 3 doses and then start p.o. vitamin B12 500 mcg daily, RPR nonreactive, Ammonia level was within normal limits of 25 -PT OT to reevaluate and OT now recommending CIR; patient will likely need SNF versus home with home  health services if she can have 24-hour care   Elevated troponin, suspect demand ischemia -Presented with troponin 231, uptrending to 246 and then trended back down to 236 -Repeat troponin -No evidence of acute ischemia on twelve-lead EKG. -Closely monitor on progressive unit. -2D echo ordered and showed a left ventricular ejection fraction of greater than 75% with a left ventricule being hyperdynamic and function and she did have a left ventricular diastolic parameters that showed grade 1 diastolic dysfunction and she also had a normal right ventricular systolic function -Was started on heparin drip in the ED and will stop as she is not complaining of any Chest Pain -EDP discussed case with cardiology Dr. Humphrey Rolls.  We feel that her EKG is nonacute and we have stopped her heparin drip as well -If she develops chest pain will consult cardiology   SIRS, unclear source could be a diarrheal source -Presented with leukocytosis, WBC 17 K, tachycardia rate of 120, tachypnea respiration rate of 25; now leukocytosis is resolved and WBC is 5.7 -UA negative for pyuria -Chest x-ray no evidence of pneumonia -Obtain CT abdomen pelvis without contrast and showed "No acute intra-abdominal pathology identified. No definite radiographic explanation for the patient's reported symptoms. Aortic Atherosclerosis" -Obtain lactic acid and was 1.7, Procalcitonin <0.10 -Blood Cx x2 showed no growth to date at 2 days -Because she  continues to have significant diarrhea per the daughter we will check a C. difficile PCR as well as a GI pathogen panel; both of these are negative. -She was started on IV antibiotics and received IV ceftriaxone but we have discontinued this. -Follow-up urine culture and blood cultures -WBC went from 17.3 -> 12.6 -> 6.8 -> 5.7 -Repeat CBC in the AM    AKI, prerenal in the setting of dehydration and poor oral intake -Self-reported poor appetite and poor oral intake for the past 2 days. -IV fluid  hydration with Sodium Bicarbonate now stopped and resumed sodium chloride at 75 MLS per hour -Avoid nephrotoxic agents, dehydration, and hypotension and renally adjust medications -Monitor urine output and Daily Weights -Patient's BUN/Cr 32/1.96 -> 28/1.37 -> 13/0.89 -> 8/0.76 -Repeat CMP in a.m.   Anion gap metabolic acidosis likely contributed by renal insufficiency -Presented with serum bicarb of 13 and this is now improved significantly. -Obtain VBG to assess pH and PCO2. -Started on isotonic bicarb drip 100 cc/h x 1 day and this has now been stopped.  We will now start normal saline at 75 MLS per hour -Patient's CO2 was now 16, anion gap was 17, chloride level was 100; now patient's CO2 is 27, anion gap is 11, chloride level is 93 -Repeat CMP in the a.m.   Hypovolemic Hyponatremia -Serum sodium is improved to 135 -Continued IV fluid hydration with isotonic bicarb but will stop this and start normal saline -Continue to monitor and trend and repeat CMP in a.m.   Isolated hyperbilirubinemia -Nonspecific -T bili went from 1.7 is now normalized at 0.3 -Repeat CMP in the morning.   Essential tremors -On primidone prior to admission. -Will need to follow-up with neurology and she has been consulted and given her confusion is question if she has early parkinsonian dementia   Essential Jypertension -BP is improving  -Resume home atenolol 25 mg p.o. daily and have added amlodipine 5 mg p.o. daily -Closely monitor vital signs carefully and continue monitor per protocol -Last blood pressure reading was 145/60   Insomnia -Discontinued Trazodone altogether   Hypokalemia -The patient's potassium this morning was 3.2 -Replete with p.o. potassium chloride 40 mEQ packet x1 and IV Kcl 40 mEQ x1 -Continue monitor and replete as necessary -Repeat CMP in a.m.  Hypophosphatemia -Patient's Phos level is 1.9 and improved to 2.8 -Replete with IV K-Phos 20 mmol yesterday  -Continue monitor and  replete as necessary -Repeat phosphorus level in a.m.  Thrombocytopenia -Mild and could be dilutional drop -Patient's platelet count went from 202 and is now 130 -Continue to monitor for signs and symptoms of bleeding; currently no overt bleeding noted -Repeat CBC in a.m.  DVT prophylaxis: SCDs; will add enoxaparin 40 mg subcu Code Status: FULL CODE  Family Communication: Discussed with Daughter extensively at bedside  Disposition Plan: Pending improvement back to baseline clearance by PT OT.  PT OT recommending home health  Status is: Inpatient  Remains inpatient appropriate because:Altered mental status, Unsafe d/c plan, IV treatments appropriate due to intensity of illness or inability to take PO, and Inpatient level of care appropriate due to severity of illness  Dispo: The patient is from: Home              Anticipated d/c is to: Home              Patient currently is not medically stable to d/c.   Difficult to place patient No  Consultants:  Psychiatry Neurology  Procedures:  ECHOCARDIOGRAM  IMPRESSIONS     1. Murmur secondary to hyperdynamic LV contraction with turbulence  through outflow tract. There is no significant outflow tract gradient,  however.   2. Left ventricular ejection fraction, by estimation, is >75%. The left  ventricle has hyperdynamic function. The left ventricle has no regional  wall motion abnormalities. Left ventricular diastolic parameters are  consistent with Grade I diastolic  dysfunction (impaired relaxation).   3. Right ventricular systolic function is normal. The right ventricular  size is normal. There is normal pulmonary artery systolic pressure. The  estimated right ventricular systolic pressure is 50.0 mmHg.   4. Left atrial size was mildly dilated.   5. The mitral valve is normal in structure. Trivial mitral valve  regurgitation. No evidence of mitral stenosis.   6. The aortic valve is normal in structure. Aortic valve regurgitation  is  not visualized. No aortic stenosis is present.   7. The inferior vena cava is normal in size with greater than 50%  respiratory variability, suggesting right atrial pressure of 3 mmHg.   FINDINGS   Left Ventricle: Left ventricular ejection fraction, by estimation, is  >75%. The left ventricle has hyperdynamic function. The left ventricle has  no regional wall motion abnormalities. The left ventricular internal  cavity size was normal in size. There  is no left ventricular hypertrophy. Left ventricular diastolic parameters  are consistent with Grade I diastolic dysfunction (impaired relaxation).   Right Ventricle: The right ventricular size is normal. No increase in  right ventricular wall thickness. Right ventricular systolic function is  normal. There is normal pulmonary artery systolic pressure. The tricuspid  regurgitant velocity is 2.16 m/s, and   with an assumed right atrial pressure of 3 mmHg, the estimated right  ventricular systolic pressure is 93.8 mmHg.   Left Atrium: Left atrial size was mildly dilated.   Right Atrium: Right atrial size was normal in size.   Pericardium: There is no evidence of pericardial effusion.   Mitral Valve: The mitral valve is normal in structure. Trivial mitral  valve regurgitation. No evidence of mitral valve stenosis.   Tricuspid Valve: The tricuspid valve is normal in structure. Tricuspid  valve regurgitation is trivial. No evidence of tricuspid stenosis.   Aortic Valve: The aortic valve is normal in structure. Aortic valve  regurgitation is not visualized. No aortic stenosis is present.   Pulmonic Valve: The pulmonic valve was normal in structure. Pulmonic valve  regurgitation is not visualized. No evidence of pulmonic stenosis.   Aorta: The aortic root is normal in size and structure.   Venous: The inferior vena cava is normal in size with greater than 50%  respiratory variability, suggesting right atrial pressure of 3 mmHg.    IAS/Shunts: No atrial level shunt detected by color flow Doppler.      LEFT VENTRICLE  PLAX 2D  LVIDd:         4.40 cm  LVIDs:         3.20 cm  LV PW:         0.90 cm  LV IVS:        1.10 cm  LVOT diam:     1.60 cm  LV SV:         53  LV SV Index:   29  LVOT Area:     2.01 cm      RIGHT VENTRICLE             IVC  RV Basal diam:  2.90 cm  IVC diam: 1.60 cm  RV S prime:     14.40 cm/s  TAPSE (M-mode): 1.6 cm   LEFT ATRIUM             Index       RIGHT ATRIUM           Index  LA diam:        3.80 cm 2.09 cm/m  RA Area:     10.80 cm  LA Vol (A2C):   68.2 ml 37.48 ml/m RA Volume:   22.80 ml  12.53 ml/m  LA Vol (A4C):   73.6 ml 40.45 ml/m  LA Biplane Vol: 72.8 ml 40.01 ml/m   AORTIC VALVE  LVOT Vmax:   138.50 cm/s  LVOT Vmean:  90.700 cm/s  LVOT VTI:    0.264 m     AORTA  Ao Root diam: 3.00 cm  Ao Asc diam:  2.60 cm   MITRAL VALVE               TRICUSPID VALVE  MV Area (PHT): 2.83 cm    TR Peak grad:   18.7 mmHg  MV Decel Time: 268 msec    TR Vmax:        216.00 cm/s  MV E velocity: 77.10 cm/s  MV A velocity: 99.10 cm/s  SHUNTS  MV E/A ratio:  0.78        Systemic VTI:  0.26 m                             Systemic Diam: 1.60 cm   EEG Description: The posterior dominant rhythm consists of 8-9Hz  activity of moderate voltage (25-35 uV) seen predominantly in posterior head regions, symmetric and reactive to eye opening and eye closing. Sleep was characterized by vertex waves, sleep spindles (12 to 14 Hz), maximal frontocentral region. Physiologic photic driving was seen during photic stimulation.  Hyperventilation was not performed  IMPRESSION: This study is within normal limits. No seizures or epileptiform discharges were seen throughout the recording.  Antimicrobials: Anti-infectives (From admission, onward)    Start     Dose/Rate Route Frequency Ordered Stop   01/22/21 0415  cefTRIAXone (ROCEPHIN) 2 g in sodium chloride 0.9 % 100 mL IVPB  Status:   Discontinued        2 g 200 mL/hr over 30 Minutes Intravenous Every 24 hours 01/22/21 0413 01/22/21 1555   01/21/21 2330  cefTRIAXone (ROCEPHIN) 1 g in sodium chloride 0.9 % 100 mL IVPB  Status:  Discontinued        1 g 200 mL/hr over 30 Minutes Intravenous  Once 01/21/21 2318 01/22/21 0457        Subjective: Seen and examined at bedside and she is doing much better from a mental standpoint but daughter thinks she still has some periods of confusion.  Diarrhea is still persistent but improving slightly.  No nausea or vomiting.  Denies any chest pain or shortness breath.  Patient thinks she is little bit stronger but daughter is concerned about her discharge disposition as she lives by herself and is not sure if she can provide 24-hour care.  Daughter has asked to speak with the social worker there.  Daughter also wants to see if neurology can come by and psychiatry can come by for any further recommendations given that her confusion is still persistent and intermittent given her negative work-up so they have been consulted.  Objective: Vitals:  01/24/21 0755 01/24/21 0954 01/24/21 1137 01/24/21 1645  BP: (!) 153/63 (!) 152/58 134/66 (!) 145/60  Pulse: 82 86 73 72  Resp: (!) 24  14 (!) 22  Temp: 99.5 F (37.5 C)  99.3 F (37.4 C) 99.5 F (37.5 C)  TempSrc: Oral  Oral Oral  SpO2: 98%  98% 99%  Weight:      Height:        Intake/Output Summary (Last 24 hours) at 01/24/2021 1719 Last data filed at 01/23/2021 1825 Gross per 24 hour  Intake 500 ml  Output --  Net 500 ml    Filed Weights   01/23/21 1716  Weight: 70.6 kg   Examination: Physical Exam:  Constitutional: Thin elderly Caucasian female currently in no acute distress appears calm  Eyes: Lids and conjunctivae normal, sclerae anicteric  ENMT: External Ears, Nose appear normal. Grossly normal hearing.  Neck: Appears normal, supple, no cervical masses, normal ROM, no appreciable thyromegaly; no JVD Respiratory: Diminished  to auscultation bilaterally, no wheezing, rales, rhonchi or crackles. Normal respiratory effort and patient is not tachypenic. No accessory muscle use.  Unlabored breathing Cardiovascular: RRR but continues to have some PACs, no murmurs / rubs / gallops. S1 and S2 auscultated. No extremity edema.  Abdomen: Soft, non-tender, non-distended.  Bowel sounds positive.  GU: Deferred. Musculoskeletal: No clubbing / cyanosis of digits/nails. No joint deformity upper and lower extremities.   Skin: No rashes, lesions, ulcers on limited skin evaluation. No induration; Warm and dry.  Neurologic: CN 2-12 grossly intact with no focal deficits. Romberg sign and cerebellar reflexes not assessed.  Psychiatric: Normal judgment and insight. Alert and oriented x 3 but is at times intermittently confused. Normal mood and appropriate affect.   Data Reviewed: I have personally reviewed following labs and imaging studies  CBC: Recent Labs  Lab 01/21/21 2046 01/22/21 0500 01/23/21 0332 01/24/21 0905  WBC 17.3* 12.6* 6.8 5.7  NEUTROABS 14.8*  --  4.1 4.0  HGB 16.5* 13.3  12.9 13.4 13.4  HCT 46.8* 37.6  38.0 37.7 39.5  MCV 94.4 94.0 92.6 96.6  PLT 318 202 146* 130*    Basic Metabolic Panel: Recent Labs  Lab 01/21/21 2046 01/22/21 0500 01/23/21 0332 01/24/21 0905  NA 134* 133*  136 131* 135  K 3.9 3.4*  3.4* 3.0* 3.2*  CL 97* 100 93* 99  CO2 13* 16* 27 28  GLUCOSE 153* 112* 109* 124*  BUN 32* 28* 13 8  CREATININE 1.96* 1.37* 0.89 0.76  CALCIUM 10.5* 8.7* 8.5* 8.5*  MG  --  1.8 1.8 1.9  PHOS  --  2.8 1.9* 2.8    GFR: Estimated Creatinine Clearance: 54.1 mL/min (by C-G formula based on SCr of 0.76 mg/dL). Liver Function Tests: Recent Labs  Lab 01/21/21 2046 01/22/21 0500 01/23/21 0332 01/24/21 0905  AST 38 42* 42* 35  ALT 21 17 16 18   ALKPHOS 100 71 67 70  BILITOT 1.7* 1.1 0.9 0.3  PROT 8.3* 6.3* 5.7* 5.8*  ALBUMIN 4.8 3.5 3.1* 3.3*    No results for input(s): LIPASE, AMYLASE in  the last 168 hours. Recent Labs  Lab 01/21/21 2047 01/24/21 0905  AMMONIA 19 25    Coagulation Profile: No results for input(s): INR, PROTIME in the last 168 hours. Cardiac Enzymes: No results for input(s): CKTOTAL, CKMB, CKMBINDEX, TROPONINI in the last 168 hours. BNP (last 3 results) No results for input(s): PROBNP in the last 8760 hours. HbA1C: No results for input(s): HGBA1C in the last  72 hours. CBG: No results for input(s): GLUCAP in the last 168 hours. Lipid Profile: No results for input(s): CHOL, HDL, LDLCALC, TRIG, CHOLHDL, LDLDIRECT in the last 72 hours. Thyroid Function Tests: Recent Labs    01/22/21 0318  TSH 0.793    Anemia Panel: Recent Labs    01/24/21 0905  VITAMINB12 198   Sepsis Labs: Recent Labs  Lab 01/22/21 0318 01/22/21 0500  PROCALCITON  --  <0.10  LATICACIDVEN 1.7  --      Recent Results (from the past 240 hour(s))  Urine Culture     Status: None   Collection Time: 01/21/21  1:37 AM   Specimen: Urine, Catheterized  Result Value Ref Range Status   Specimen Description URINE, CATHETERIZED  Final   Special Requests NONE  Final   Culture   Final    NO GROWTH Performed at [facility], [address], [place], [place] [address]    Report Status 01/23/2021 FINAL  Final  Resp Panel by RT-PCR (Flu A&B, Covid) Nasopharyngeal Swab     Status: None   Collection Time: 01/22/21  1:10 AM   Specimen: Nasopharyngeal Swab; Nasopharyngeal(NP) swabs in vial transport medium  Result Value Ref Range Status   SARS Coronavirus 2 by RT PCR NEGATIVE NEGATIVE Final    Comment: (NOTE) SARS-CoV-2 target nucleic acids are NOT DETECTED.  The SARS-CoV-2 RNA is generally detectable in upper respiratory specimens during the acute phase of infection. The lowest concentration of SARS-CoV-2 viral copies this assay can detect is 138 copies/mL. A negative result does not preclude SARS-Cov-2 infection and should not be used as the sole basis for treatment  or other patient management decisions. A negative result may occur with  improper specimen collection/handling, submission of specimen other than nasopharyngeal swab, presence of viral mutation(s) within the areas targeted by this assay, and inadequate number of viral copies(<138 copies/mL). A negative result must be combined with clinical observations, patient history, and epidemiological information. The expected result is Negative.  Fact Sheet for Patients:  EntrepreneurPulse.com.au  Fact Sheet for Healthcare Providers:  IncredibleEmployment.be  This test is no t yet approved or cleared by the [place] FDA and  has been authorized for detection and/or diagnosis of SARS-CoV-2 by FDA under an Emergency Use Authorization (EUA). This EUA will remain  in effect (meaning this test can be used) for the duration of the COVID-19 declaration under Section 564(b)(1) of the Act, 21 U.S.C.section 360bbb-3(b)(1), unless the authorization is terminated  or revoked sooner.       Influenza A by PCR NEGATIVE NEGATIVE Final   Influenza B by PCR NEGATIVE NEGATIVE Final    Comment: (NOTE) The Xpert Xpress SARS-CoV-2/FLU/RSV plus assay is intended as an aid in the diagnosis of influenza from Nasopharyngeal swab specimens and should not be used as a sole basis for treatment. Nasal washings and aspirates are unacceptable for Xpert Xpress SARS-CoV-2/FLU/RSV testing.  Fact Sheet for Patients: EntrepreneurPulse.com.au  Fact Sheet for Healthcare Providers: IncredibleEmployment.be  This test is not yet approved or cleared by the [place] FDA and has been authorized for detection and/or diagnosis of SARS-CoV-2 by FDA under an Emergency Use Authorization (EUA). This EUA will remain in effect (meaning this test can be used) for the duration of the COVID-19 declaration under Section 564(b)(1) of the Act, 21 U.S.C. section  360bbb-3(b)(1), unless the authorization is terminated or revoked.  Performed at [facility], [place] [address] [place], [place] [address]   Culture, blood (routine  x 2)     Status: None (Preliminary result)   Collection Time: 01/22/21  3:25 AM   Specimen: BLOOD RIGHT HAND  Result Value Ref Range Status   Specimen Description BLOOD RIGHT HAND  Final   Special Requests   Final    BOTTLES DRAWN AEROBIC AND ANAEROBIC Blood Culture adequate volume   Culture   Final    NO GROWTH 2 DAYS Performed at [facility], [address], [place], [place] [address]    Report Status PENDING  Incomplete  Culture, blood (routine x 2)     Status: None (Preliminary result)   Collection Time: 01/22/21  4:15 AM   Specimen: BLOOD LEFT FOREARM  Result Value Ref Range Status   Specimen Description BLOOD LEFT FOREARM  Final   Special Requests   Final    BOTTLES DRAWN AEROBIC AND ANAEROBIC Blood Culture adequate volume   Culture   Final    NO GROWTH 2 DAYS Performed at [facility], [place] [address], [place], [place] [address]    Report Status PENDING  Incomplete  MRSA Next Gen by PCR, Nasal     Status: None   Collection Time: 01/22/21  5:20 AM   Specimen: Nasal Mucosa; Nasal Swab  Result Value Ref Range Status   MRSA by PCR Next Gen NOT DETECTED NOT DETECTED Final    Comment: (NOTE) The GeneXpert MRSA Assay (FDA approved for NASAL specimens only), is one component of a comprehensive MRSA colonization surveillance program. It is not intended to diagnose MRSA infection nor to guide or monitor treatment for MRSA infections. Test performance is not FDA approved in patients less than 28 years old. Performed at [facility], [place] [address] [place], [place] [address]   C Difficile Quick Screen w PCR reflex     Status: None   Collection Time: 01/24/21  5:51 AM   Specimen: STOOL  Result Value Ref Range Status   C Diff antigen NEGATIVE NEGATIVE Final   C Diff toxin NEGATIVE NEGATIVE  Final   C Diff interpretation No C. difficile detected.  Final    Comment: Performed at [facility], [place] [address] [place],  [address]  Gastrointestinal Panel by PCR , Stool     Status: None   Collection Time: 01/24/21  5:51 AM   Specimen: Stool  Result Value Ref Range Status   Campylobacter species NOT DETECTED NOT DETECTED Final   Plesimonas shigelloides NOT DETECTED NOT DETECTED Final   Salmonella species NOT DETECTED NOT DETECTED Final   Yersinia enterocolitica NOT DETECTED NOT DETECTED Final   Vibrio species NOT DETECTED NOT DETECTED Final   Vibrio cholerae NOT DETECTED NOT DETECTED Final   Enteroaggregative E coli (EAEC) NOT DETECTED NOT DETECTED Final   Enteropathogenic E coli (EPEC) NOT DETECTED NOT DETECTED Final   Enterotoxigenic E coli (ETEC) NOT DETECTED NOT DETECTED Final   Shiga like toxin producing E coli (STEC) NOT DETECTED NOT DETECTED Final   Shigella/Enteroinvasive E coli (EIEC) NOT DETECTED NOT DETECTED Final   Cryptosporidium NOT DETECTED NOT DETECTED Final   Cyclospora cayetanensis NOT DETECTED NOT DETECTED Final   Entamoeba histolytica NOT DETECTED NOT DETECTED Final   Giardia lamblia NOT DETECTED NOT DETECTED Final   Adenovirus F40/41 NOT DETECTED NOT DETECTED Final   Astrovirus NOT DETECTED NOT DETECTED Final   Norovirus GI/GII NOT DETECTED NOT DETECTED Final   Rotavirus A NOT DETECTED NOT DETECTED Final   Sapovirus (I, II, IV, and  V) NOT DETECTED NOT DETECTED Final    Comment: Performed at [facility], [place] [place], [place] [address]      RN Pressure Injury Documentation: Pressure Injury 01/22/21 Buttocks Bilateral Stage 1 -  Intact skin with non-blanchable redness of a localized area usually over a bony prominence. nonblanching redness, device related (commode) on buttocks (Active)  01/22/21 2130  Location: Buttocks  Location Orientation: Bilateral  Staging: Stage 1 -  Intact skin with non-blanchable redness of a  localized area usually over a bony prominence.  Wound Description (Comments): nonblanching redness, device related (commode) on buttocks  Present on Admission: Yes     Estimated body mass index is 26.06 kg/m as calculated from the following:   Height as of this encounter: 5' 4.8" (1.646 m).   Weight as of this encounter: 70.6 kg.  Malnutrition Type: Malnutrition Characteristics: Nutrition Interventions:    Radiology Studies: MR BRAIN WO CONTRAST  Result Date: 01/23/2021 CLINICAL DATA:  Mental status change EXAM: MRI HEAD WITHOUT CONTRAST TECHNIQUE: Multiplanar, multiecho pulse sequences of the brain and surrounding structures were obtained without intravenous contrast. COMPARISON:  CT head 01/21/2021 FINDINGS: Brain: Ventricle size and cerebral volume normal for age. Moderate white matter changes with scattered white matter hyperintensities throughout both cerebral hemispheres. Brainstem and cerebellum normal. Negative for hemorrhage or mass. Negative for acute infarct. Vascular: Normal arterial flow voids. Skull and upper cervical spine: Negative Sinuses/Orbits: Negative Other: None IMPRESSION: No acute abnormality. Moderate white matter changes consistent with chronic microvascular ischemia. Electronically Signed   By: Franchot Gallo M.D.   On: 01/23/2021 15:53    Scheduled Meds:  amLODipine  5 mg Oral Daily   atenolol  25 mg Oral Daily   cyanocobalamin  1,000 mcg Intramuscular Daily   enoxaparin (LOVENOX) injection  40 mg Subcutaneous Q24H   pregabalin  75 mg Oral Daily   primidone  25 mg Oral BID   simvastatin  40 mg Oral QHS   [START ON 01/27/2021] vitamin B-12  500 mcg Oral Daily   Continuous Infusions:  sodium chloride 75 mL/hr at 01/23/21 1733    LOS: 2 days   Kerney Elbe, DO [facility] PAGER is on AMION  If 7PM-7AM, please contact night-coverage [email or web address]

## 2021-01-24 NOTE — Consult Note (Signed)
   [name] is a 80 y.o. female with PMHx of BCCa, benign essential tremor recently started on Primidone, CKD, HTN, fibromyalgia, OA, HLD, and GERD who presented to ED 2 days ago secondary to confusion x 24 hours and being found on the commode. On 01/20/21, at 0700 hours, daughter who is a Therapist, sports, found her to be confused and not answering questions properly. On 01/21/21, daughter stopped by to check on patient, and patient did not answer the door, so daughter left. On recheck, patient again did not answer her phone, or the door, so Police were called and patient was found on the commode for unknown time frame, confused, and non verbal.   En route, patient's mental status began to clear and states she is back to her normal self. Per daughter, patient has made poor decisions on and off since January 2022 and daughter is concerned it could be medication related. Daughter thinks patient took more than prescribed Trazodone on 01/20/21. Daughter reports changes of moods.   Patient is seen and assessed by this nurse practitioner. Her daughter Arbie Cookey is present at the bedside. Patient is alert and oriented x 3, appropriate to situation and time. She is observed to feeding herself, and smiles appropriately. She appears to show much insight, and improved judgement at this time throughout the brief evaluation. Arbie Cookey expresses concerns regarding her mother's recent changes in behavior, to include fluctuations in cognitive impairment, intermittent periods of confusion.  She reports her last depressive episode which lasted briefly for a few days was in March 2022.  She also suggested that mother may have taken multiple trazodone in an attempt to get some sleep, however mom reports only taking her beta-blocker and 1 trazodone at night.  She denies any intentional overdose of any other medication.  She further denies any previous suicide attempts, suicidal ideations, and or suicidal thoughts.  Patient does admit to some cognitive  impairment, and does appear to be open to receiving outpatient evaluation.  Daughter and patient have decided that after rehabilitation, she will come and live with the daughter.  Patient does appear to be in agreements, although she will miss her independence.  We briefly discussed adult daycare centers, geriatric psychiatry visits which can be conducted at night outpatient setting virtually, and family support groups for persons with dementia and cognitive impairment.  We briefly reviewed the recent MRI results, that show Moderate white matter changes consistent with chronic microvascular ischemia.  We discussed these findings can lead to dementia, and patient will benefit from outpatient neuropsychiatry/neuropsychology consult and evaluation for dementia.   -Patient does not appear to be a danger to herself or others, she has no acute findings that would warrant a full inpatient psychiatric evaluation.. -Patient with some fluctuations in cognitive impairment and intermittent periods of confusion, that would benefit from an outpatient neuropsych consult and evaluation.  She is currently a patient at [facility].  Inpatient neurology evaluation has been placed, no is pending at this time. -Recommend skilled nursing facility, with emphasis on neurocognitive rehabilitation. -Recommend TOC consult for outpatient geriatric psychiatry referral to include call behavioral health outpatient, [facility] for possible medication management. -Recommend TOC consult for family support and caregiver resources groups for presents with cognitive impairment, dementia. -Psychiatry to sign off at this time.  The above information has been discussed with current attending Dr. Alfredia Ferguson.  Thank you for consulting please reach out for any additional comments questions or concerns.

## 2021-01-24 NOTE — Progress Notes (Signed)
Occupational Therapy Treatment Patient Details Name: [name] MRN: [id number] DOB: 02/13/1941 Today's Date: 01/24/2021    History of present illness The pt is an 80 yo female presenting 7/15 with AMS. Upon workup, pt with multifocal acute metabolic encephalopathy, elevated troponins secondary to suspected demand ischemia, and SIRS. EEG (-). PMH includes: essential tremors, fibromyalgia, HTN, HLD, CKD, L TKA, and insomnia.   OT comments  Patient met seated on BSC upon entry. Daughter present at bedside. OT treatment session with focus on self-care re-education, ADL transfers and functional cognition. Patient continues to require Min guard to Min A for ADLs grossly with use of RW. Portion of session spent discussing d/c plan with patient/family. Daughter reports that she and the patient live in different cities and that she works nights and is currently unable to provide 24hr supervision/assist. Daughter is hopeful for short-term rehab in CIR to maximize patient safety/independence with ADLs. Daughter states that after short-term rehab, there is potential for patient to move in with her at which time she can provide necessary supervision and assist with IADLs. OT will continue to follow acutely.    Follow Up Recommendations  CIR    Equipment Recommendations  3 in 1 bedside commode    Recommendations for Other Services      Precautions / Restrictions Precautions Precautions: Fall Restrictions Weight Bearing Restrictions: No       Mobility Bed Mobility Overal bed mobility: Needs Assistance             General bed mobility comments: Patient seated on BSC upon entry.    Transfers Overall transfer level: Needs assistance Equipment used: Rolling walker (2 wheeled) Transfers: Sit to/from [facility] Sit to Stand: Min guard Stand pivot transfers: Min guard       General transfer comment: Min guard with increased time for sit to stand from [facility] to RW. Cues for  hand placement.    Balance Overall balance assessment: Needs assistance Sitting-balance support: No upper extremity supported;Feet supported Sitting balance-Leahy Scale: Good     Standing balance support: Bilateral upper extremity supported Standing balance-Leahy Scale: Fair Standing balance comment: Able to maintain static standing balance with unilateral UE support on RW during hygiene/clothing management in standing. Reliant on BUE support on RW with dynamic balance.                           ADL either performed or assessed with clinical judgement   ADL Overall ADL's : Needs assistance/impaired     Grooming: Min guard;Standing Grooming Details (indicate cue type and reason): 2/3 grooming tasks standing at sink level with Min guard for safety.                 Toilet Transfer: Min Art therapist Details (indicate cue type and reason): Min guard to [facility] with increased time/effort. Toileting- Clothing Manipulation and Hygiene: Minimal assistance;Sit to/from stand Toileting - Clothing Manipulation Details (indicate cue type and reason): Min A for hygiene/clothing management for safety and thoroughness.       General ADL Comments: Patient with improved mentation this date. Continued generalized weakness and decreased cognition.     Vision   Vision Assessment?: No apparent visual deficits   Perception     Praxis      Cognition Arousal/Alertness: Lethargic Behavior During Therapy: WFL for tasks assessed/performed;Flat affect Overall Cognitive Status: Within Functional Limits for tasks assessed  Exercises     Shoulder Instructions       General Comments Daughte present at bedside.    Pertinent Vitals/ Pain          Home Living                                          Prior Functioning/Environment              Frequency  Min 2X/week        Progress  Toward Goals  OT Goals(current goals can now be found in the care plan section)  Progress towards OT goals: Progressing toward goals  Acute Rehab OT Goals Patient Stated Goal: To go to CIR. OT Goal Formulation: With patient Time For Goal Achievement: 02/06/21 Potential to Achieve Goals: Good ADL Goals Pt Will Perform Grooming: with supervision;standing Pt Will Perform Upper Body Dressing: Independently Pt Will Perform Lower Body Dressing: with supervision;sit to/from stand Pt Will Transfer to Toilet: with supervision;ambulating Pt Will Perform Toileting - Clothing Manipulation and hygiene: with supervision;sit to/from stand Additional ADL Goal #1: Patient will score <4/28 on SBT indicating increased cognition.  Plan Discharge plan needs to be updated;Frequency remains appropriate    Co-evaluation                 AM-PAC OT "6 Clicks" Daily Activity     Outcome Measure   Help from another person eating meals?: None Help from another person taking care of personal grooming?: A Little Help from another person toileting, which includes using toliet, bedpan, or urinal?: A Little Help from another person bathing (including washing, rinsing, drying)?: A Little Help from another person to put on and taking off regular upper body clothing?: A Little Help from another person to put on and taking off regular lower body clothing?: A Little 6 Click Score: 19    End of Session Equipment Utilized During Treatment: Gait belt;Rolling walker  OT Visit Diagnosis: Unsteadiness on feet (R26.81);Muscle weakness (generalized) (M62.81)   Activity Tolerance Patient tolerated treatment well   Patient Left with call bell/phone within reach;in bed;with bed alarm set;with family/visitor present;Other (comment) (Seated EOB for lunch meal. Daughter present in room.)   Nurse Communication Mobility status        Time: 8377-9396 OT Time Calculation (min): 32 min  Charges: OT General  Charges $OT Visit: 1 Visit OT Treatments $Self Care/Home Management : 23-37 mins  Sahra Converse H. OTR/L Supplemental OT, Department of rehab services [phone]   Herald Vallin R H. 01/24/2021, 11:40 AM

## 2021-01-24 NOTE — Plan of Care (Signed)

## 2021-01-25 DIAGNOSIS — G250 Essential tremor: Secondary | ICD-10-CM

## 2021-01-25 DIAGNOSIS — R4182 Altered mental status, unspecified: Secondary | ICD-10-CM

## 2021-01-25 DIAGNOSIS — N179 Acute kidney failure, unspecified: Secondary | ICD-10-CM

## 2021-01-25 LAB — CBC WITH DIFFERENTIAL/PLATELET
Abs Immature Granulocytes: 0.04 10*3/uL (ref 0.00–0.07)
Basophils Absolute: 0 10*3/uL (ref 0.0–0.1)
Basophils Relative: 0 %
Eosinophils Absolute: 0.1 10*3/uL (ref 0.0–0.5)
Eosinophils Relative: 1 %
HCT: 37.3 % (ref 36.0–46.0)
Hemoglobin: 13 g/dL (ref 12.0–15.0)
Immature Granulocytes: 1 %
Lymphocytes Relative: 20 %
Lymphs Abs: 1.6 10*3/uL (ref 0.7–4.0)
MCH: 33.2 pg (ref 26.0–34.0)
MCHC: 34.9 g/dL (ref 30.0–36.0)
MCV: 95.2 fL (ref 80.0–100.0)
Monocytes Absolute: 0.7 10*3/uL (ref 0.1–1.0)
Monocytes Relative: 9 %
Neutro Abs: 5.6 10*3/uL (ref 1.7–7.7)
Neutrophils Relative %: 69 %
Platelets: 137 10*3/uL — ABNORMAL LOW (ref 150–400)
RBC: 3.92 MIL/uL (ref 3.87–5.11)
RDW: 12.8 % (ref 11.5–15.5)
WBC: 8.1 10*3/uL (ref 4.0–10.5)
nRBC: 0 % (ref 0.0–0.2)

## 2021-01-25 LAB — COMPREHENSIVE METABOLIC PANEL
ALT: 17 U/L (ref 0–44)
AST: 29 U/L (ref 15–41)
Albumin: 3.2 g/dL — ABNORMAL LOW (ref 3.5–5.0)
Alkaline Phosphatase: 64 U/L (ref 38–126)
Anion gap: 6 (ref 5–15)
BUN: 5 mg/dL — ABNORMAL LOW (ref 8–23)
CO2: 24 mmol/L (ref 22–32)
Calcium: 8.6 mg/dL — ABNORMAL LOW (ref 8.9–10.3)
Chloride: 100 mmol/L (ref 98–111)
Creatinine, Ser: 0.69 mg/dL (ref 0.44–1.00)
GFR, Estimated: >60 mL/min (ref >60)
Glucose, Bld: 120 mg/dL — ABNORMAL HIGH (ref 70–99)
Potassium: 4.4 mmol/L (ref 3.5–5.1)
Sodium: 130 mmol/L — ABNORMAL LOW (ref 135–145)
Total Bilirubin: 0.6 mg/dL (ref 0.3–1.2)
Total Protein: 6.2 g/dL — ABNORMAL LOW (ref 6.5–8.1)

## 2021-01-25 LAB — COMPREHENSIVE METABOLIC PANEL WITH GFR
ALT: 18 U/L (ref 0–44)
AST: 28 U/L (ref 15–41)
Albumin: 3.3 g/dL — ABNORMAL LOW (ref 3.5–5.0)
Alkaline Phosphatase: 62 U/L (ref 38–126)
Anion gap: 8 (ref 5–15)
BUN: <5 mg/dL — ABNORMAL LOW (ref 8–23)
CO2: 24 mmol/L (ref 22–32)
Calcium: 8.7 mg/dL — ABNORMAL LOW (ref 8.9–10.3)
Chloride: 99 mmol/L (ref 98–111)
Creatinine, Ser: 0.64 mg/dL (ref 0.44–1.00)
GFR, Estimated: >60 mL/min
Glucose, Bld: 155 mg/dL — ABNORMAL HIGH (ref 70–99)
Potassium: 3.9 mmol/L (ref 3.5–5.1)
Sodium: 131 mmol/L — ABNORMAL LOW (ref 135–145)
Total Bilirubin: 0.5 mg/dL (ref 0.3–1.2)
Total Protein: 6.1 g/dL — ABNORMAL LOW (ref 6.5–8.1)

## 2021-01-25 LAB — PHOSPHORUS: Phosphorus: 2.4 mg/dL — ABNORMAL LOW (ref 2.5–4.6)

## 2021-01-25 LAB — MAGNESIUM
Magnesium: 1.6 mg/dL — ABNORMAL LOW (ref 1.7–2.4)
Magnesium: 1.6 mg/dL — ABNORMAL LOW (ref 1.7–2.4)

## 2021-01-25 MED ORDER — LORAZEPAM 1 MG PO TABS: 1.0000 mg | ORAL_TABLET | ORAL | Status: DC | PRN

## 2021-01-25 MED ORDER — THIAMINE HCL 100 MG/ML IJ SOLN
100.0000 mg | Freq: Every day | INTRAMUSCULAR | Status: DC
  Filled 2021-01-25: qty 2

## 2021-01-25 MED ORDER — ADULT MULTIVITAMIN W/MINERALS CH
1.0000 | ORAL_TABLET | Freq: Every day | ORAL | Status: DC
  Administered 2021-01-25 – 2021-01-27 (×3): 1 via ORAL
  Filled 2021-01-25 (×3): qty 1

## 2021-01-25 MED ORDER — LORAZEPAM 1 MG PO TABS
0.0000 mg | ORAL_TABLET | Freq: Four times a day (QID) | ORAL | Status: AC
  Administered 2021-01-25 – 2021-01-26 (×2): 1 mg via ORAL
  Administered 2021-01-26: 2 mg via ORAL
  Administered 2021-01-26: 1 mg via ORAL
  Filled 2021-01-25: qty 1
  Filled 2021-01-25: qty 4
  Filled 2021-01-25: qty 2
  Filled 2021-01-25: qty 1
  Filled 2021-01-25: qty 2

## 2021-01-25 MED ORDER — FOLIC ACID 1 MG PO TABS
1.0000 mg | ORAL_TABLET | Freq: Every day | ORAL | Status: DC
  Administered 2021-01-25 – 2021-01-27 (×3): 1 mg via ORAL
  Filled 2021-01-25 (×3): qty 1

## 2021-01-25 MED ORDER — MAGNESIUM SULFATE 2 GM/50ML IV SOLN
2.0000 g | Freq: Once | INTRAVENOUS | Status: AC
  Administered 2021-01-25: 2 g via INTRAVENOUS
  Filled 2021-01-25: qty 50

## 2021-01-25 MED ORDER — LORAZEPAM 1 MG PO TABS: 0.0000 mg | ORAL_TABLET | Freq: Two times a day (BID) | ORAL | Status: DC

## 2021-01-25 MED ORDER — LORAZEPAM 2 MG/ML IJ SOLN: 1.0000 mg | INTRAMUSCULAR | Status: DC | PRN

## 2021-01-25 MED ORDER — K PHOS MONO-SOD PHOS DI & MONO 155-852-130 MG PO TABS
500.0000 mg | ORAL_TABLET | Freq: Once | ORAL | Status: AC
  Administered 2021-01-25: 500 mg via ORAL
  Filled 2021-01-25: qty 2

## 2021-01-25 MED ORDER — THIAMINE HCL 100 MG PO TABS
100.0000 mg | ORAL_TABLET | Freq: Every day | ORAL | Status: DC
  Administered 2021-01-25 – 2021-01-27 (×3): 100 mg via ORAL
  Filled 2021-01-25 (×3): qty 1

## 2021-01-25 NOTE — TOC Progression Note (Addendum)
Transition of Care [facility]) - Progression Note    Patient Details  Name: JAEDIN TRUMBO MRN: [id number] Date of Birth: 01-14-1941  Transition of Care [facility]) CM/SW Contact  Carles Collet, RN Phone Number: 01/25/2021, 11:31 AM  Clinical Narrative:    Damaris Schooner w patient's daughter over the phone to clarify preference and plans for DC. Current recs are for [facility], and patient declined PT this morning.  She states that their first preference would be CIR. Informed her that patient would need recommendations from PT, to be willing and able to participate in 3 hours of therapy a day, be agreeable to go to CIR, and have insurance approval. Second choice would be SNF per daughter. Again reviewed that PT would need to rec SNF, patient be in agreeance, and insurance would have to authorize. Patient has been vaccinated x4 per daughter.  Daughter states that if patient went home, she would go home to address on facesheet w [facility] services (provider to be determined, daughter states no particular agency preference) and would need private duty care. Daughter spoke to CM yesterday about [place] and is looking into cost. Would need 3/1 for home, has RW.  TOC will continue to follow for PT eval today.    16:30 PASRR obtained, FL2 done, patient sent out in HUB to SNF in [facility] with updated PT note.     Expected Discharge Plan: IP Rehab Facility Barriers to Discharge: Continued Medical Work up  Expected Discharge Plan and Services Expected Discharge Plan: [place] In-house Referral: Clinical Social Work Discharge Planning Services: CM Consult   Living arrangements for the past 2 months: Single Family Home                 DME Arranged: N/A DME Agency: NA       HH Arranged: NA HH Agency: NA         Social Determinants of Health (SDOH) Interventions    Readmission Risk Interventions No flowsheet data found.

## 2021-01-25 NOTE — Progress Notes (Signed)
While cleaning pt staff found a full bottle of bourbon hidden under the pt  bed pads. When ask why do she have the alcohol. Pt replied "please don't tell and that she would never do it again". Rn provided education to pt and the importance of not drinking especially while in the hospital and that it can cause serious harm to the pt and medication interaction with the alcohol.it was placed away from pt and charge RN was made aware of the the incident.

## 2021-01-25 NOTE — Plan of Care (Signed)

## 2021-01-25 NOTE — NC FL2 (Signed)
[place] LEVEL OF CARE SCREENING TOOL     IDENTIFICATION  Patient Name: [name] Birthdate: 08-04-1940 Sex: female Admission Date (Current Location): 01/21/2021  [facility] and [place] Number:  Herbalist and Address:  [facility], [place] [address] [place], [place] [address]      Provider Number: [id number]  Attending Physician Name and Address:  Kerney Elbe, DO  Relative Name and Phone Number:  Altha Harm (Daughter)   [phone]    Current Level of Care: Hospital Recommended Level of Care: [place] Prior Approval Number:    Date Approved/Denied: 01/25/21 PASRR Number: [id number]  Discharge Plan: SNF    Current Diagnoses: Patient Active Problem List   Diagnosis Date Noted   Acute kidney injury (nontraumatic) ([place])    Pressure injury of skin 01/23/2021   AMS (altered mental status) 01/22/2021   OSA (obstructive sleep apnea) 08/23/2020   History of snoring 07/27/2020   Dysphonia of essential tremor 06/21/2020   Essential tremor 06/21/2020   Chronic insomnia 06/21/2020   Status post total knee replacement, left 04/13/2017   Vitamin D deficiency 04/12/2017   History of chronic kidney disease 04/12/2017   Osteopenia of multiple sites 04/06/2017   Fibromyalgia syndrome 09/11/2016   Other fatigue 09/11/2016   Primary insomnia 09/11/2016   History of osteopenia 09/11/2016   OA (osteoarthritis) of knee 11/08/2015   Essential hypertension, benign 12/16/2013   Pure hypercholesterolemia 12/16/2013    Orientation RESPIRATION BLADDER Height & Weight     Self, Time, Situation, Place  Normal Continent Weight: 70.6 kg Height:  5' 4.8" (164.6 cm)  BEHAVIORAL SYMPTOMS/MOOD NEUROLOGICAL BOWEL NUTRITION STATUS      Continent Diet (see DC Summary)  AMBULATORY STATUS COMMUNICATION OF NEEDS Skin   Limited Assist Verbally Normal                       Personal Care Assistance Level of Assistance   Bathing, Dressing Bathing Assistance: Limited assistance   Dressing Assistance: Limited assistance     Functional Limitations Info  Sight, Hearing, Speech Sight Info: Adequate Hearing Info: Adequate Speech Info: Adequate    SPECIAL CARE FACTORS FREQUENCY  PT (By licensed PT), OT (By licensed OT)     PT Frequency: 5 times a week OT Frequency: 5 times a week            Contractures Contractures Info: Not present    Additional Factors Info  Code Status, Allergies Code Status Info: full Allergies Info: HCTZ, lisinopril, sulfa antibiotics           Current Medications (01/25/2021):  This is the current hospital active medication list Current Facility-Administered Medications  Medication Dose Route Frequency Provider Last Rate Last Admin   0.9 %  sodium chloride infusion   Intravenous Continuous Raiford Noble [place], DO 75 mL/hr at 01/25/21 0938 New Bag at 01/25/21 0938   acetaminophen (TYLENOL) tablet 650 mg  650 mg Oral Q6H PRN Irene Pap N, DO       amLODipine (NORVASC) tablet 5 mg  5 mg Oral Daily Friendly, Carole N, DO   5 mg at 01/25/21 2536   atenolol (TENORMIN) tablet 25 mg  25 mg Oral Daily Irene Pap N, DO   25 mg at 01/24/21 6440   cyanocobalamin ((VITAMIN B-12)) injection 1,000 mcg  1,000 mcg Intramuscular Daily Gardiner Barefoot, NP   1,000 mcg at 01/25/21 0824   enoxaparin (LOVENOX) injection 40 mg  40 mg Subcutaneous Q24H Raiford Noble [place], [place]   40 mg at 66/06/30 1601   folic acid (FOLVITE) tablet 1 mg  1 mg Oral Daily Raiford Noble [place], DO   1 mg at 01/25/21 0940   loperamide (IMODIUM) capsule 2 mg  2 mg Oral PRN Raiford Noble Latif, DO   2 mg at 01/25/21 0932   LORazepam (ATIVAN) tablet 1-4 mg  1-4 mg Oral Q1H PRN Raiford Noble Latif, DO       Or   LORazepam (ATIVAN) injection 1-4 mg  1-4 mg Intravenous Q1H PRN Sheikh, Omair Latif, DO       LORazepam (ATIVAN) tablet 0-4 mg  0-4 mg Oral Q6H Sheikh, Omair [place], DO       Followed by   Derrill Memo ON  01/27/2021] LORazepam (ATIVAN) tablet 0-4 mg  0-4 mg Oral Q12H Sheikh, Omair Latif, DO       metoprolol tartrate (LOPRESSOR) injection 5 mg  5 mg Intravenous Once PRN Wyvonnia Dusky, MD       multivitamin with minerals tablet 1 tablet  1 tablet Oral Daily Raiford Noble [place], DO   1 tablet at 01/25/21 0939   ondansetron (ZOFRAN) injection 4 mg  4 mg Intravenous Q6H PRN Irene Pap N, DO       pregabalin (LYRICA) capsule 75 mg  75 mg Oral Daily Irene Pap N, DO   75 mg at 01/25/21 3557   primidone (MYSOLINE) tablet 25 mg  25 mg Oral BID Irene Pap N, DO   25 mg at 01/25/21 3220   simvastatin (ZOCOR) tablet 40 mg  40 mg Oral QHS Hall, Carole N, DO   40 mg at 01/24/21 2215   thiamine tablet 100 mg  100 mg Oral Daily Raiford Noble [place], DO   100 mg at 01/25/21 2542   Or   thiamine (B-1) injection 100 mg  100 mg Intravenous Daily Sheikh, Georgina Quint Latif, DO       traMADol Veatrice Bourbon) tablet 100 mg  100 mg Oral BID PRN Kayleen Memos, DO       [START ON 01/27/2021] vitamin B-12 (CYANOCOBALAMIN) tablet 500 mcg  500 mcg Oral Daily Kirby-Graham, Karsten [place], NP         Discharge Medications: Please see discharge summary for a list of discharge medications.  Relevant Imaging Results:  Relevant Lab Results:   Additional Information [id number]  Carles Collet, RN

## 2021-01-25 NOTE — Progress Notes (Signed)
Physical Therapy Treatment Patient Details Name: [name] MRN: [id number] DOB: [birth year] Today's Date: 01/25/2021    History of Present Illness The pt is an 80 yo female presenting 7/15 with AMS. Upon workup, pt with multifocal acute metabolic encephalopathy, elevated troponins secondary to suspected demand ischemia, and SIRS. EEG (-). PMH includes: essential tremors, fibromyalgia, HTN, HLD, CKD, L TKA, and insomnia.    PT Comments    Patient agreeable to session and reports she feels much better after getting some rest. Patient has experienced a significant decline in function (previously living independently, driving). She currently requires min assist for balance if walking without a device with very slow, guarded gait. With RW she requires min guard assist with cues for safe use (including avoiding running into objects). She remains a high fall risk and needs to be independent with ambulation and basic ADLs to stay with her daughter (who works). Agree with OT re-assessment for higher level of rehab.     Follow Up Recommendations  SNF     Equipment Recommendations  Rolling walker with 5" wheels;3in1 (PT)    Recommendations for Other Services       Precautions / Restrictions Precautions Precautions: Fall Precaution Comments: incontinent of BM    Mobility  Bed Mobility               General bed mobility comments: Pt seated on EOB on arrival and at end of session (with daughter present)    Transfers Overall transfer level: Needs assistance Equipment used: Rolling walker (2 wheeled);None Transfers: Sit to/from [facility] Sit to Stand: Min guard         General transfer comment: Min guard with increased time/attempts for sit to stand from EOB. Cues for hand placement when using RW.  Ambulation/Gait Ambulation/Gait assistance: Min assist;Min guard Gait Distance (Feet): 100 Feet (with RW; 30 ft no device) Assistive device: Rolling walker (2  wheeled) Gait Pattern/deviations: Step-through pattern;Shuffle;Trunk flexed Gait velocity: decreased Gait velocity interpretation: <1.8 ft/sec, indicate of risk for recurrent falls General Gait Details: with RW pt needed cues x 2 to avoid objects (once in hall, once in room); without device pt with slower velocity, no UE swing (almost guarded positioning with UEs as walking) and generally feels weak and unsteady.   Stairs             Wheelchair Mobility    Modified Rankin (Stroke Patients Only)       Balance Overall balance assessment: Needs assistance Sitting-balance support: No upper extremity supported;Feet supported Sitting balance-Leahy Scale: Good     Standing balance support: No upper extremity supported Standing balance-Leahy Scale: Fair Standing balance comment: with no UE support, eyes open, feet shoulder width apart pt able to maintain balance. Dynamically only able to reach 4" forward; posterior LOB with feet apart and eyes closed x 8 sec and unable to achieve tandem stance                            Cognition Arousal/Alertness: Awake/alert Behavior During Therapy: WFL for tasks assessed/performed;Flat affect Overall Cognitive Status: Within Functional Limits for tasks assessed                                        Exercises      General Comments General comments (skin integrity, edema, etc.): Daughter present throughout session.  Pertinent Vitals/Pain Pain Assessment: No/denies pain    Home Living                      Prior Function            PT Goals (current goals can now be found in the care plan section) Acute Rehab PT Goals Patient Stated Goal: To go to CIR. Time For Goal Achievement: 02/05/21 Potential to Achieve Goals: Good Progress towards PT goals: Progressing toward goals    Frequency    Min 2X/week      PT Plan Discharge plan needs to be updated    Co-evaluation               AM-PAC PT "6 Clicks" Mobility   Outcome Measure  Help needed turning from your back to your side while in a flat bed without using bedrails?: A Little Help needed moving from lying on your back to sitting on the side of a flat bed without using bedrails?: A Little Help needed moving to and from a bed to a chair (including a wheelchair)?: A Little Help needed standing up from a chair using your arms (e.g., wheelchair or bedside chair)?: A Little Help needed to walk in hospital room?: A Little Help needed climbing 3-5 steps with a railing? : A Lot 6 Click Score: 17    End of Session Equipment Utilized During Treatment: Gait belt Activity Tolerance: Patient tolerated treatment well Patient left: in bed;with call bell/phone within reach;with family/visitor present Nurse Communication: Mobility status PT Visit Diagnosis: Unsteadiness on feet (R26.81);Other abnormalities of gait and mobility (R26.89);Muscle weakness (generalized) (M62.81)     Time: 2751-7001 PT Time Calculation (min) (ACUTE ONLY): 24 min  Charges:  $Gait Training: 8-22 mins $Therapeutic Activity: 8-22 mins                      Arby Barrette, PT Pager [phone]    Rexanne Mano 01/25/2021, 3:18 PM

## 2021-01-25 NOTE — Progress Notes (Signed)
PT Cancellation Note  Patient Details Name: [name] MRN: [id number] DOB: March 16, 1941   Cancelled Treatment:    Reason Eval/Treat Not Completed: Fatigue/lethargy limiting ability to participate   Patient reports she did not sleep at all last night and just cannot do any activity at this time. Explained need to assess for potential change in discharge plan. Daughter attempted to persuade pt to participate and pt became adamant that she was not going to work with PT today. Again explained importance and that I will return around lunch time to try to assess her.    Arby Barrette, PT Pager [phone]   Rexanne Mano 01/25/2021, 9:15 AM

## 2021-01-25 NOTE — Progress Notes (Signed)
PROGRESS NOTE    [name]  BWG:[id number] DOB: 04/16/1941 DOA: 01/21/2021 PCP: Kelton Pillar, MD   Brief Narrative:  The patient is a elderly 80 year old Caucasian female with a past medical history significant for but not limited to tremors who was started on primidone, history of fibromyalgia, essential hypertension, hyperlipidemia, chronic insomnia as well as other comorbidities who presented to the [facility] emergency room via EMS after she was found to be confused on the commode.  Most of the history was obtained from the patient's daughter at bedside and the patient's daughter noted the patient had mild confusion around midnight on Thursday while talking the phone with her mother.  Her daughter works the night shift and called and noted that her answers were out of character for around 7 AM that morning she noted clear confusion was worse.  Patient was answering questions inappropriately and did not want to see her.  Recently she lost position of a [place] that she recently adopted and was sad about it.  On Thursday evening at 10:30 PM the patient's daughter became concerned and came to the mother's house however mother would not open the door and she left.  The following day on Friday, the day of presentation, the patient would not answer her phone call from her daughter so the daughter went to the patient's house with officers from the police department on 1:77 PM.  They found her sitting on the commode.  It appeared that she may been sitting there for hours.  There is no urine or stool in the commode.  Patient was nonverbal and EMS was activated she is brought to the ED for further evaluation.  Her mentation gradually improved in the ED and returned to baseline and head CT was negative and urinalysis was negative.  Troponins were elevated and uptrending.  Due to concern for sepsis and patient received IV fluid bolus and started on antibiotics with IV Rocephin.  She has had poor  appetite for last few days and [facility] was asked to admit this patient.  **Interim History Daughter states that patient has been having significant diarrhea since prior to admission.  We will work-up her diarrhea and obtain C. difficile as well as a GI pathogen panel.  Patient daughter also thinks that she is not back to her baseline even though she was awake and alert and oriented x3.  We will obtain an MRI for further evaluation.  EEG was unremarkable.  PT OT recommending home health.  Electrolytes are low in the setting of her diarrhea and will be replete.  We will continue monitor carefully and see if her mentation improves.  Her leukocytosis has improved as well as her AKI.  Her mentation is improved significantly however daughter thinks that she still has periods of significant confusion and requested a psych evaluation and neurology evaluation prior to discharge.  PT initially recommended home health but patient does not have a safe discharge disposition as she does not have 24-hour care and supervision so she may end up going to a SNF prior to discharge for rehabilitation.  PT reevaluated now recommending SNF.  Patient was found to have a bottle of bourbon with her last night it was confiscated.  It is unlikely that she took any but will place on CIWA protocol in case she did.  TOC assisting with placement given that she is medically stable to be discharged now   Assessment & Plan:   Active Problems:   Essential tremor  AMS (altered mental status)   Pressure injury of skin   Acute kidney injury (nontraumatic) (HCC)    Acute metabolic encephalopathy suspect multifactorial, improving but could be in the setting of Progressive decline /Early Dementia, waxing and waning  -Appeared to be back to her baseline mentation as she was awake and alert and oriented x3 however daughter does not think she is back to her baseline yet as she has been significantly confused still and having trouble following  commands; after further discussion with the patient's daughter the daughter thinks that she may have overdosed on her trazodone given her recent stressful event with her dog. -Daughter thinks that she is much improved today but still has some confusion; Consulted Psych and Neurology for further Evaluation -Psych feels as she has some fluctuations in cognitive impairment and intermittent periods of confusion and they are recommending outpatient neuropsych consult for further evaluation.  Psych is recommended skilled nursing facility with emphasis on neurocognitive rehabilitation and TOC has been consulted for outpatient geriatric psychiatry referral to include calling behavioral health as an outpatient for possible medication management -Patient's daughter has felt that she has had periods of mania and significant depression.  Psychiatry recommended outpatient evaluation -Neurology consulted for further evaluation for medications given the primidone as this could be early Parkinson's dementia -Continue IV fluid hydration as below  -CT head non-acute -UA negative for pyuria but did show rare bacteria, negative leukocytes, negative nitrites, 0-5 squamous epithelial cells, 0-5 RBCs per high-power field, 0-5 WBCs -Obtain TSH and was 0.793 -Hypovolemic on exam, continue IV fluid hydration and her AKI is much improved -Reorient if needed -EEG was ordered and showed that the study was within normal limits -Because of persistent confusion per the daughter we will obtain an MRI of the brain without contrast -Daughter now states that the patient is having significant diarrhea that was not mentioned prior to admission -Continue to Monitor Closely and if mentation does not improve even after getting an MRI we will consult neurology for further evaluation and possibly psychiatry to see if this is a behavioral thing -MRI Done and showed "No acute abnormality. Moderate white matter changes consistent with chronic  microvascular ischemia." -Check B12 and was 198 and low so will start repletion neurology did not give the patient IM subcu cyanocobalamin 1000 mcg daily for 3 doses and then start p.o. vitamin B12 500 mcg daily, RPR nonreactive, Ammonia level was within normal limits of 25 -PT OT to reevaluate and OT now recommending CIR; patient will likely need SNF versus home with home health services if she can have 24-hour care.  PT reevaluated now recommending SNF.  TOC consulted for assistance with placement given her increased level rehab needs.   Elevated troponin, suspect demand ischemia -Presented with troponin 231, uptrending to 246 and then trended back down to 236 -Repeat troponin -No evidence of acute ischemia on twelve-lead EKG. -Closely monitor on progressive unit. -2D echo ordered and showed a left ventricular ejection fraction of greater than 75% with a left ventricule being hyperdynamic and function and she did have a left ventricular diastolic parameters that showed grade 1 diastolic dysfunction and she also had a normal right ventricular systolic function -Was started on heparin drip in the ED and will stop as she is not complaining of any Chest Pain -EDP discussed case with cardiology Dr. Humphrey Rolls.  We feel that her EKG is nonacute and we have stopped her heparin drip as well -If she develops chest pain will consult cardiology  SIRS, unclear source could be a diarrheal source -Presented with leukocytosis, WBC 17 K, tachycardia rate of 120, tachypnea respiration rate of 25; now leukocytosis is resolved and WBC is 5.7 -UA negative for pyuria -Chest x-ray no evidence of pneumonia -Obtain CT abdomen pelvis without contrast and showed "No acute intra-abdominal pathology identified. No definite radiographic explanation for the patient's reported symptoms. Aortic Atherosclerosis" -Obtain lactic acid and was 1.7, Procalcitonin <0.10 -Blood Cx x2 showed no growth to date at 2 days -Because she  continues to have significant diarrhea per the daughter we will check a C. difficile PCR as well as a GI pathogen panel; both of these are negative. -She was started on IV antibiotics and received IV ceftriaxone but we have discontinued this. -Follow-up urine culture and blood cultures -WBC went from 17.3 -> 12.6 -> 6.8 -> 5.7 and today it is 8.1 -Repeat CBC in the AM    AKI, prerenal in the setting of dehydration and poor oral intake -Self-reported poor appetite and poor oral intake for the past 2 days. -IV fluid hydration with Sodium Bicarbonate now stopped and resumed sodium chloride at 75 MLS per hour -Avoid nephrotoxic agents, dehydration, and hypotension and renally adjust medications -Monitor urine output and Daily Weights -Patient's BUN/Cr 32/1.96 -> 28/1.37 -> 13/0.89 -> 8/0.76 and trended down to 5/0.69 -Repeat CMP in a.m.   Anion gap metabolic acidosis likely contributed by renal insufficiency -Presented with serum bicarb of 13 and this is now improved significantly. -Obtain VBG to assess pH and PCO2. -Started on isotonic bicarb drip 100 cc/h x 1 day and this has now been stopped.  We will now start normal saline at 75 MLS per hour -Patient's CO2 was now 16, anion gap was 17, chloride level was 100; now patient's CO2 is 24, anion gap is 6, chloride level is 100 -Repeat CMP in the a.m.   Hypovolemic Hyponatremia -Serum sodium is improved to 135 yesterday but then dropped to 130 this morning -Continued IV fluid hydration with isotonic bicarb but will stop this and continue normal saline at 75 MLS per hour -Continue to monitor and trend and repeat CMP in a.m.   Isolated hyperbilirubinemia -Nonspecific -T bili went from 1.7 is now normalized at 0.3 yesterday and today 0.6 -Repeat CMP in the morning.   Essential tremors -On primidone prior to admission. -Will need to follow-up with neurology and she has been consulted and given her confusion is question if she has early  parkinsonian dementia   Essential Jypertension -BP is improving  -Resume home atenolol 25 mg p.o. daily and have added amlodipine 5 mg p.o. daily -Closely monitor vital signs carefully and continue monitor per protocol -Last blood pressure reading was 145/60   Insomnia -Discontinued Trazodone altogether   Hypokalemia -The patient's potassium this morning was 4.4 -Continue monitor and replete as necessary -Repeat CMP in a.m.  Hypophosphatemia -Patient's Phos level is 2.4 -Replete with p.o. K-Phos Neutral 500 mg x 1 -Continue monitor and replete as necessary -Repeat phosphorus level in a.m.  Hypomagnesemia -Patient's magnesium level is 1.6 -Replete with IV mag sulfate 2 g  -continue monitor and replete as necessary -Repeat magnesium level in a.m.  Thrombocytopenia -Mild and could be dilutional drop -Patient's platelet count went from 202 and is now 130 yesterday and today is 137 -Continue to monitor for signs and symptoms of bleeding; currently no overt bleeding noted -Repeat CBC in a.m.  DVT prophylaxis: SCDs; will add enoxaparin 40 mg subcu Code Status: FULL CODE  Family Communication: Discussed with Daughter extensively at bedside extensively Disposition Plan: Pending improvement back to baseline clearance by PT OT.  PT OT recommending home health  Status is: Inpatient  Remains inpatient appropriate because:Altered mental status, Unsafe d/c plan, IV treatments appropriate due to intensity of illness or inability to take PO, and Inpatient level of care appropriate due to severity of illness  Dispo: The patient is from: Home              Anticipated d/c is to: Home              Patient currently is not medically stable to d/c.   Difficult to place patient No  Consultants:  Psychiatry Neurology  Procedures:  ECHOCARDIOGRAM IMPRESSIONS     1. Murmur secondary to hyperdynamic LV contraction with turbulence  through outflow tract. There is no significant outflow  tract gradient,  however.   2. Left ventricular ejection fraction, by estimation, is >75%. The left  ventricle has hyperdynamic function. The left ventricle has no regional  wall motion abnormalities. Left ventricular diastolic parameters are  consistent with Grade I diastolic  dysfunction (impaired relaxation).   3. Right ventricular systolic function is normal. The right ventricular  size is normal. There is normal pulmonary artery systolic pressure. The  estimated right ventricular systolic pressure is 36.1 mmHg.   4. Left atrial size was mildly dilated.   5. The mitral valve is normal in structure. Trivial mitral valve  regurgitation. No evidence of mitral stenosis.   6. The aortic valve is normal in structure. Aortic valve regurgitation is  not visualized. No aortic stenosis is present.   7. The inferior vena cava is normal in size with greater than 50%  respiratory variability, suggesting right atrial pressure of 3 mmHg.   FINDINGS   Left Ventricle: Left ventricular ejection fraction, by estimation, is  >75%. The left ventricle has hyperdynamic function. The left ventricle has  no regional wall motion abnormalities. The left ventricular internal  cavity size was normal in size. There  is no left ventricular hypertrophy. Left ventricular diastolic parameters  are consistent with Grade I diastolic dysfunction (impaired relaxation).   Right Ventricle: The right ventricular size is normal. No increase in  right ventricular wall thickness. Right ventricular systolic function is  normal. There is normal pulmonary artery systolic pressure. The tricuspid  regurgitant velocity is 2.16 m/s, and   with an assumed right atrial pressure of 3 mmHg, the estimated right  ventricular systolic pressure is 44.3 mmHg.   Left Atrium: Left atrial size was mildly dilated.   Right Atrium: Right atrial size was normal in size.   Pericardium: There is no evidence of pericardial effusion.   Mitral  Valve: The mitral valve is normal in structure. Trivial mitral  valve regurgitation. No evidence of mitral valve stenosis.   Tricuspid Valve: The tricuspid valve is normal in structure. Tricuspid  valve regurgitation is trivial. No evidence of tricuspid stenosis.   Aortic Valve: The aortic valve is normal in structure. Aortic valve  regurgitation is not visualized. No aortic stenosis is present.   Pulmonic Valve: The pulmonic valve was normal in structure. Pulmonic valve  regurgitation is not visualized. No evidence of pulmonic stenosis.   Aorta: The aortic root is normal in size and structure.   Venous: The inferior vena cava is normal in size with greater than 50%  respiratory variability, suggesting right atrial pressure of 3 mmHg.   IAS/Shunts: No atrial level shunt detected by  color flow Doppler.      LEFT VENTRICLE  PLAX 2D  LVIDd:         4.40 cm  LVIDs:         3.20 cm  LV PW:         0.90 cm  LV IVS:        1.10 cm  LVOT diam:     1.60 cm  LV SV:         53  LV SV Index:   29  LVOT Area:     2.01 cm      RIGHT VENTRICLE             IVC  RV Basal diam:  2.90 cm     IVC diam: 1.60 cm  RV S prime:     14.40 cm/s  TAPSE (M-mode): 1.6 cm   LEFT ATRIUM             Index       RIGHT ATRIUM           Index  LA diam:        3.80 cm 2.09 cm/m  RA Area:     10.80 cm  LA Vol (A2C):   68.2 ml 37.48 ml/m RA Volume:   22.80 ml  12.53 ml/m  LA Vol (A4C):   73.6 ml 40.45 ml/m  LA Biplane Vol: 72.8 ml 40.01 ml/m   AORTIC VALVE  LVOT Vmax:   138.50 cm/s  LVOT Vmean:  90.700 cm/s  LVOT VTI:    0.264 m     AORTA  Ao Root diam: 3.00 cm  Ao Asc diam:  2.60 cm   MITRAL VALVE               TRICUSPID VALVE  MV Area (PHT): 2.83 cm    TR Peak grad:   18.7 mmHg  MV Decel Time: 268 msec    TR Vmax:        216.00 cm/s  MV E velocity: 77.10 cm/s  MV A velocity: 99.10 cm/s  SHUNTS  MV E/A ratio:  0.78        Systemic VTI:  0.26 m                             Systemic Diam:  1.60 cm   EEG Description: The posterior dominant rhythm consists of 8-9Hz  activity of moderate voltage (25-35 uV) seen predominantly in posterior head regions, symmetric and reactive to eye opening and eye closing. Sleep was characterized by vertex waves, sleep spindles (12 to 14 Hz), maximal frontocentral region. Physiologic photic driving was seen during photic stimulation.  Hyperventilation was not performed  IMPRESSION: This study is within normal limits. No seizures or epileptiform discharges were seen throughout the recording.  Antimicrobials: Anti-infectives (From admission, onward)    Start     Dose/Rate Route Frequency Ordered Stop   01/22/21 0415  cefTRIAXone (ROCEPHIN) 2 g in sodium chloride 0.9 % 100 mL IVPB  Status:  Discontinued        2 g 200 mL/hr over 30 Minutes Intravenous Every 24 hours 01/22/21 0413 01/22/21 1555   01/21/21 2330  cefTRIAXone (ROCEPHIN) 1 g in sodium chloride 0.9 % 100 mL IVPB  Status:  Discontinued        1 g 200 mL/hr over 30 Minutes Intravenous  Once 01/21/21 2318 01/22/21 0457        Subjective: Seen  and examined at bedside and daughter thinks he is doing little bit worse today.  Patient had snuck a bottle of bourbon in her bed last night and unclear how she got this and daughter thinks it may have been one of her friends about her son.  She denies any chest pain lightheadedness or dizziness but did not really want to work with therapy today and was tired and states that she did not get a good nights rest.  PT reevaluated later in the afternoon and recommending SNF.  TOC consulted for assistance with placement and she is medically stable to be discharged to SNF.  She is empirically placed on a CIWA protocol however we do not think that she actually drank anything last night because her bottle was so full.  We will need to continue to monitor carefully.  No other concerns or complaints at this time.  Objective: Vitals:   01/24/21 2312 01/25/21 0507  01/25/21 0829 01/25/21 1551  BP: (!) 149/58 (!) 155/65 (!) 166/77 (!) 150/68  Pulse: 78 76 89 87  Resp:   (!) 27 20  Temp: 98.6 F (37 C) 98.5 F (36.9 C) 98.6 F (37 C)   TempSrc: Oral Oral Oral   SpO2: 99% 99% 99% 98%  Weight:      Height:        Intake/Output Summary (Last 24 hours) at 01/25/2021 1735 Last data filed at 01/25/2021 0500 Gross per 24 hour  Intake 936.31 ml  Output 5 ml  Net 931.31 ml    Filed Weights   01/23/21 1716  Weight: 70.6 kg   Examination: Physical Exam:  Constitutional: Thin elderly overweight Caucasian female currently in no acute distress appears calm but little frustrated Eyes: Lids and conjunctivae normal, sclerae anicteric  ENMT: External Ears, Nose appear normal. Grossly normal hearing.  Neck: Appears normal, supple, no cervical masses, normal ROM, no appreciable thyromegaly; no JVD Respiratory: Diminished to auscultation bilaterally, no wheezing, rales, rhonchi or crackles. Normal respiratory effort and patient is not tachypenic. No accessory muscle use.  Unlabored breathing Cardiovascular: RRR, no murmurs / rubs / gallops. S1 and S2 auscultated.  Minimal extremity edema Abdomen: Soft, non-tender, distended secondary body habitus. Bowel sounds positive.  GU: Deferred. Musculoskeletal: No clubbing / cyanosis of digits/nails. No joint deformity upper and lower extremities on limited skin evaluation.  Skin: No rashes, lesions, ulcers. No induration; Warm and dry.  Neurologic: CN 2-12 grossly intact with no focal deficits. Romberg sign cerebellar reflexes not assessed.  Psychiatric: Normal judgment and insight. Alert and oriented x 3 is all confused and frustrated today.  Mood and appropriate affect.   Data Reviewed: I have personally reviewed following labs and imaging studies  CBC: Recent Labs  Lab 01/21/21 2046 01/22/21 0500 01/23/21 0332 01/24/21 0905 01/25/21 0143  WBC 17.3* 12.6* 6.8 5.7 8.1  NEUTROABS 14.8*  --  4.1 4.0 5.6  HGB  16.5* 13.3  12.9 13.4 13.4 13.0  HCT 46.8* 37.6  38.0 37.7 39.5 37.3  MCV 94.4 94.0 92.6 96.6 95.2  PLT 318 202 146* 130* 137*    Basic Metabolic Panel: Recent Labs  Lab 01/21/21 2046 01/22/21 0500 01/23/21 0332 01/24/21 0905 01/25/21 0143 01/25/21 0930  NA 134* 133*  136 131* 135 130*  --   K 3.9 3.4*  3.4* 3.0* 3.2* 4.4  --   CL 97* 100 93* 99 100  --   CO2 13* 16* 27 28 24   --   GLUCOSE 153* 112* 109* 124*  120*  --   BUN 32* 28* 13 8 5*  --   CREATININE 1.96* 1.37* 0.89 0.76 0.69  --   CALCIUM 10.5* 8.7* 8.5* 8.5* 8.6*  --   MG  --  1.8 1.8 1.9 1.6* 1.6*  PHOS  --  2.8 1.9* 2.8 2.4*  --     GFR: Estimated Creatinine Clearance: 54.1 mL/min (by C-G formula based on SCr of 0.69 mg/dL). Liver Function Tests: Recent Labs  Lab 01/21/21 2046 01/22/21 0500 01/23/21 0332 01/24/21 0905 01/25/21 0143  AST 38 42* 42* 35 29  ALT 21 17 16 18 17   ALKPHOS 100 71 67 70 64  BILITOT 1.7* 1.1 0.9 0.3 0.6  PROT 8.3* 6.3* 5.7* 5.8* 6.2*  ALBUMIN 4.8 3.5 3.1* 3.3* 3.2*    No results for input(s): LIPASE, AMYLASE in the last 168 hours. Recent Labs  Lab 01/21/21 2047 01/24/21 0905  AMMONIA 19 25    Coagulation Profile: No results for input(s): INR, PROTIME in the last 168 hours. Cardiac Enzymes: No results for input(s): CKTOTAL, CKMB, CKMBINDEX, TROPONINI in the last 168 hours. BNP (last 3 results) No results for input(s): PROBNP in the last 8760 hours. HbA1C: No results for input(s): HGBA1C in the last 72 hours. CBG: No results for input(s): GLUCAP in the last 168 hours. Lipid Profile: No results for input(s): CHOL, HDL, LDLCALC, TRIG, CHOLHDL, LDLDIRECT in the last 72 hours. Thyroid Function Tests: No results for input(s): TSH, T4TOTAL, FREET4, T3FREE, THYROIDAB in the last 72 hours.  Anemia Panel: Recent Labs    01/24/21 0905  VITAMINB12 198    Sepsis Labs: Recent Labs  Lab 01/22/21 0318 01/22/21 0500  PROCALCITON  --  <0.10  LATICACIDVEN 1.7  --       Recent Results (from the past 240 hour(s))  Urine Culture     Status: None   Collection Time: 01/21/21  1:37 AM   Specimen: Urine, Catheterized  Result Value Ref Range Status   Specimen Description URINE, CATHETERIZED  Final   Special Requests NONE  Final   Culture   Final    NO GROWTH Performed at [facility], [address] [place], [place] [address]    Report Status 01/23/2021 FINAL  Final  Resp Panel by RT-PCR (Flu A&B, Covid) Nasopharyngeal Swab     Status: None   Collection Time: 01/22/21  1:10 AM   Specimen: Nasopharyngeal Swab; Nasopharyngeal(NP) swabs in vial transport medium  Result Value Ref Range Status   SARS Coronavirus 2 by RT PCR NEGATIVE NEGATIVE Final    Comment: (NOTE) SARS-CoV-2 target nucleic acids are NOT DETECTED.  The SARS-CoV-2 RNA is generally detectable in upper respiratory specimens during the acute phase of infection. The lowest concentration of SARS-CoV-2 viral copies this assay can detect is 138 copies/mL. A negative result does not preclude SARS-Cov-2 infection and should not be used as the sole basis for treatment or other patient management decisions. A negative result may occur with  improper specimen collection/handling, submission of specimen other than nasopharyngeal swab, presence of viral mutation(s) within the areas targeted by this assay, and inadequate number of viral copies(<138 copies/mL). A negative result must be combined with clinical observations, patient history, and epidemiological information. The expected result is Negative.  Fact Sheet for Patients:  EntrepreneurPulse.com.au  Fact Sheet for Healthcare Providers:  IncredibleEmployment.be  This test is no t yet approved or cleared by the [place] FDA and  has been authorized for detection and/or diagnosis of SARS-CoV-2  by FDA under an Emergency Use Authorization (EUA). This EUA will remain  in effect (meaning this test  can be used) for the duration of the COVID-19 declaration under Section 564(b)(1) of the Act, 21 U.S.C.section 360bbb-3(b)(1), unless the authorization is terminated  or revoked sooner.       Influenza A by PCR NEGATIVE NEGATIVE Final   Influenza B by PCR NEGATIVE NEGATIVE Final    Comment: (NOTE) The Xpert Xpress SARS-CoV-2/FLU/RSV plus assay is intended as an aid in the diagnosis of influenza from Nasopharyngeal swab specimens and should not be used as a sole basis for treatment. Nasal washings and aspirates are unacceptable for Xpert Xpress SARS-CoV-2/FLU/RSV testing.  Fact Sheet for Patients: EntrepreneurPulse.com.au  Fact Sheet for Healthcare Providers: IncredibleEmployment.be  This test is not yet approved or cleared by the [place] FDA and has been authorized for detection and/or diagnosis of SARS-CoV-2 by FDA under an Emergency Use Authorization (EUA). This EUA will remain in effect (meaning this test can be used) for the duration of the COVID-19 declaration under Section 564(b)(1) of the Act, 21 U.S.C. section 360bbb-3(b)(1), unless the authorization is terminated or revoked.  Performed at [facility], [place] [address] [place], [place] [address]   Culture, blood (routine x 2)     Status: None (Preliminary result)   Collection Time: 01/22/21  3:25 AM   Specimen: BLOOD RIGHT HAND  Result Value Ref Range Status   Specimen Description BLOOD RIGHT HAND  Final   Special Requests   Final    BOTTLES DRAWN AEROBIC AND ANAEROBIC Blood Culture adequate volume   Culture   Final    NO GROWTH 3 DAYS Performed at [facility], [place] [address], [place], [place] [address]    Report Status PENDING  Incomplete  Culture, blood (routine x 2)     Status: None (Preliminary result)   Collection Time: 01/22/21  4:15 AM   Specimen: BLOOD LEFT FOREARM  Result Value Ref Range Status   Specimen Description BLOOD LEFT FOREARM  Final    Special Requests   Final    BOTTLES DRAWN AEROBIC AND ANAEROBIC Blood Culture adequate volume   Culture   Final    NO GROWTH 3 DAYS Performed at [facility], [place] [address] [place], [place] [address]    Report Status PENDING  Incomplete  MRSA Next Gen by PCR, Nasal     Status: None   Collection Time: 01/22/21  5:20 AM   Specimen: Nasal Mucosa; Nasal Swab  Result Value Ref Range Status   MRSA by PCR Next Gen NOT DETECTED NOT DETECTED Final    Comment: (NOTE) The GeneXpert MRSA Assay (FDA approved for NASAL specimens only), is one component of a comprehensive MRSA colonization surveillance program. It is not intended to diagnose MRSA infection nor to guide or monitor treatment for MRSA infections. Test performance is not FDA approved in patients less than 57 years old. Performed at [facility], [place] [address], [place], [place] [address]   C Difficile Quick Screen w PCR reflex     Status: None   Collection Time: 01/24/21  5:51 AM   Specimen: STOOL  Result Value Ref Range Status   C Diff antigen NEGATIVE NEGATIVE Final   C Diff toxin NEGATIVE NEGATIVE Final   C Diff interpretation No C. difficile detected.  Final    Comment: Performed at [facility], [place] [address] [place], [place] [address]  Gastrointestinal Panel by  PCR , Stool     Status: None   Collection Time: 01/24/21  5:51 AM   Specimen: Stool  Result Value Ref Range Status   Campylobacter species NOT DETECTED NOT DETECTED Final   Plesimonas shigelloides NOT DETECTED NOT DETECTED Final   Salmonella species NOT DETECTED NOT DETECTED Final   Yersinia enterocolitica NOT DETECTED NOT DETECTED Final   Vibrio species NOT DETECTED NOT DETECTED Final   Vibrio cholerae NOT DETECTED NOT DETECTED Final   Enteroaggregative E coli (EAEC) NOT DETECTED NOT DETECTED Final   Enteropathogenic E coli (EPEC) NOT DETECTED NOT DETECTED Final   Enterotoxigenic E coli (ETEC) NOT DETECTED NOT DETECTED Final   Shiga like  toxin producing E coli (STEC) NOT DETECTED NOT DETECTED Final   Shigella/Enteroinvasive E coli (EIEC) NOT DETECTED NOT DETECTED Final   Cryptosporidium NOT DETECTED NOT DETECTED Final   Cyclospora cayetanensis NOT DETECTED NOT DETECTED Final   Entamoeba histolytica NOT DETECTED NOT DETECTED Final   Giardia lamblia NOT DETECTED NOT DETECTED Final   Adenovirus F40/41 NOT DETECTED NOT DETECTED Final   Astrovirus NOT DETECTED NOT DETECTED Final   Norovirus GI/GII NOT DETECTED NOT DETECTED Final   Rotavirus A NOT DETECTED NOT DETECTED Final   Sapovirus (I, II, IV, and V) NOT DETECTED NOT DETECTED Final    Comment: Performed at [facility], [place] [place], [place] [address]      RN Pressure Injury Documentation: Pressure Injury 01/22/21 Buttocks Bilateral Stage 1 -  Intact skin with non-blanchable redness of a localized area usually over a bony prominence. nonblanching redness, device related (commode) on buttocks (Active)  01/22/21 2130  Location: Buttocks  Location Orientation: Bilateral  Staging: Stage 1 -  Intact skin with non-blanchable redness of a localized area usually over a bony prominence.  Wound Description (Comments): nonblanching redness, device related (commode) on buttocks  Present on Admission: Yes     Estimated body mass index is 26.06 kg/m as calculated from the following:   Height as of this encounter: 5' 4.8" (1.646 m).   Weight as of this encounter: 70.6 kg.  Malnutrition Type: Malnutrition Characteristics: Nutrition Interventions:    Radiology Studies: No results found.  Scheduled Meds:  amLODipine  5 mg Oral Daily   atenolol  25 mg Oral Daily   cyanocobalamin  1,000 mcg Intramuscular Daily   enoxaparin (LOVENOX) injection  40 mg Subcutaneous H82X   folic acid  1 mg Oral Daily   LORazepam  0-4 mg Oral Q6H   Followed by   Derrill Memo ON 01/27/2021] LORazepam  0-4 mg Oral Q12H   multivitamin with minerals  1 tablet Oral Daily   pregabalin   75 mg Oral Daily   primidone  25 mg Oral BID   simvastatin  40 mg Oral QHS   thiamine  100 mg Oral Daily   Or   thiamine  100 mg Intravenous Daily   [START ON 01/27/2021] vitamin B-12  500 mcg Oral Daily   Continuous Infusions:  sodium chloride 75 mL/hr at 01/25/21 0938    LOS: 3 days   Kerney Elbe, DO [facility] PAGER is on AMION  If 7PM-7AM, please contact night-coverage [email or web address]

## 2021-01-25 NOTE — Care Management Important Message (Signed)
Important Message  Patient Details  Name: [name] MRN: [id number] Date of Birth: 03-11-1941   Medicare Important Message Given:  Yes     Orbie Pyo 01/25/2021, 2:48 PM

## 2021-01-26 DIAGNOSIS — N179 Acute kidney failure, unspecified: Secondary | ICD-10-CM

## 2021-01-26 LAB — CBC WITH DIFFERENTIAL/PLATELET
Abs Immature Granulocytes: 0.06 10*3/uL (ref 0.00–0.07)
Basophils Absolute: 0 10*3/uL (ref 0.0–0.1)
Basophils Relative: 0 %
Eosinophils Absolute: 0.1 10*3/uL (ref 0.0–0.5)
Eosinophils Relative: 2 %
HCT: 36 % (ref 36.0–46.0)
Hemoglobin: 12.5 g/dL (ref 12.0–15.0)
Immature Granulocytes: 1 %
Lymphocytes Relative: 24 %
Lymphs Abs: 2 10*3/uL (ref 0.7–4.0)
MCH: 33.2 pg (ref 26.0–34.0)
MCHC: 34.7 g/dL (ref 30.0–36.0)
MCV: 95.7 fL (ref 80.0–100.0)
Monocytes Absolute: 0.8 10*3/uL (ref 0.1–1.0)
Monocytes Relative: 10 %
Neutro Abs: 5.3 10*3/uL (ref 1.7–7.7)
Neutrophils Relative %: 63 %
Platelets: 135 10*3/uL — ABNORMAL LOW (ref 150–400)
RBC: 3.76 MIL/uL — ABNORMAL LOW (ref 3.87–5.11)
RDW: 13 % (ref 11.5–15.5)
WBC: 8.3 10*3/uL (ref 4.0–10.5)
nRBC: 0 % (ref 0.0–0.2)

## 2021-01-26 LAB — COMPREHENSIVE METABOLIC PANEL
ALT: 15 U/L (ref 0–44)
AST: 23 U/L (ref 15–41)
Albumin: 2.9 g/dL — ABNORMAL LOW (ref 3.5–5.0)
Alkaline Phosphatase: 58 U/L (ref 38–126)
Anion gap: 4 — ABNORMAL LOW (ref 5–15)
BUN: <5 mg/dL — ABNORMAL LOW (ref 8–23)
CO2: 26 mmol/L (ref 22–32)
Calcium: 8.5 mg/dL — ABNORMAL LOW (ref 8.9–10.3)
Chloride: 101 mmol/L (ref 98–111)
Creatinine, Ser: 0.75 mg/dL (ref 0.44–1.00)
GFR, Estimated: >60 mL/min (ref >60)
Glucose, Bld: 104 mg/dL — ABNORMAL HIGH (ref 70–99)
Potassium: 4.3 mmol/L (ref 3.5–5.1)
Sodium: 131 mmol/L — ABNORMAL LOW (ref 135–145)
Total Bilirubin: 0.3 mg/dL (ref 0.3–1.2)
Total Protein: 5.5 g/dL — ABNORMAL LOW (ref 6.5–8.1)

## 2021-01-26 LAB — MAGNESIUM: Magnesium: 2 mg/dL (ref 1.7–2.4)

## 2021-01-26 LAB — PHOSPHORUS: Phosphorus: 4.1 mg/dL (ref 2.5–4.6)

## 2021-01-26 NOTE — Progress Notes (Signed)
Inpatient Rehab Admissions Coordinator Note:   Per OT recommendations, pt was screened for CIR candidacy by Shann Medal, PT, DPT.  At this time note pt supervision to min guard for all mobility and ADLs, likely would not require intensity of CIR program and HTA unlikely to approve CIR.  No consult recommended at this time.  Please contact me with questions.   Shann Medal, PT, DPT [phone] 01/26/21 3:37 PM

## 2021-01-26 NOTE — TOC Progression Note (Signed)
Transition of Care [facility]) - Progression Note    Patient Details  Name: [name] MRN: [id number] Date of Birth: [birth year]  Transition of [facility]) CM/SW [place], RN Phone Number:[phone]  01/26/2021, 4:08 PM  Clinical Narrative:    Insurance auth initiated   Expected Discharge Plan: IP Rehab Facility Barriers to Discharge: Continued Medical Work up  Expected Discharge Plan and Services Expected Discharge Plan: [place] In-house Referral: Clinical Social Work Discharge Planning Services: CM Consult   Living arrangements for the past 2 months: Single Family Home                 DME Arranged: N/A DME Agency: NA       HH Arranged: NA HH Agency: NA         Social Determinants of Health (SDOH) Interventions    Readmission Risk Interventions No flowsheet data found.

## 2021-01-26 NOTE — Progress Notes (Signed)
Occupational Therapy Treatment Patient Details Name: [name] MRN: [id number] DOB: [birth year] Today's Date: 01/26/2021    History of present illness The pt is an 80 yo female presenting 7/15 with AMS. Upon workup, pt with multifocal acute metabolic encephalopathy, elevated troponins secondary to suspected demand ischemia, and SIRS. EEG (-). PMH includes: essential tremors, fibromyalgia, HTN, HLD, CKD, L TKA, and insomnia.   OT comments  Pt in session was able to complete bed mobility with supervision and HOB elevated, sit to stand transfer from elevated surface with min guard to ambulate to the bathroom with FW and min guard. Pt  required min assist with hygiene in standing and grab bars. Then they were able to complete oral care and hand washing with min guard as decrease in balance with standing ADLs. Pt currently with functional limitations due to the deficits listed below (see OT Problem List).  Pt will benefit from skilled OT to increase their safety and independence with ADL and functional mobility for ADL to facilitate discharge to venue listed below.    Follow Up Recommendations  CIR;SNF (pt is increase in ability to complete, will follow with ability to tolerate CIR level)    Equipment Recommendations  3 in 1 bedside commode    Recommendations for Other Services      Precautions / Restrictions Precautions Precautions: Fall Restrictions Weight Bearing Restrictions: No       Mobility Bed Mobility Overal bed mobility: Needs Assistance Bed Mobility: Supine to Sit     Supine to sit: HOB elevated;Supervision Sit to supine: Supervision        Transfers Overall transfer level: Needs assistance Equipment used: Rolling walker (2 wheeled);None Transfers: Sit to/from Stand Sit to Stand: Min guard (to power up off low surfaces)              Balance Overall balance assessment: Needs assistance Sitting-balance support: No upper extremity supported;Feet  supported Sitting balance-Leahy Scale: Good Sitting balance - Comments: pt able to lean outside BOS without LOB                                   ADL either performed or assessed with clinical judgement   ADL Overall ADL's : Needs assistance/impaired Eating/Feeding: Independent;Sitting   Grooming: Wash/dry hands;Oral care;Supervision/safety;Standing;Cueing for safety;Cueing for sequencing   Upper Body Bathing: Supervision/ safety;Sitting;Cueing for safety;Cueing for sequencing   Lower Body Bathing: Min guard;Cueing for safety;Cueing for sequencing;Sit to/from stand   Upper Body Dressing : Supervision/safety;Cueing for safety;Cueing for sequencing;Sitting;Standing   Lower Body Dressing: Minimal assistance;Cueing for sequencing;Cueing for safety;Sit to/from stand   Toilet Transfer: Min guard;Cueing for safety;Cueing for sequencing;Grab bars;Ambulation   Toileting- Clothing Manipulation and Hygiene: Minimal assistance;Cueing for sequencing;Cueing for safety;Sit to/from stand       Functional mobility during ADLs: Min guard;Rolling walker;Cueing for safety;Cueing for sequencing       Vision   Vision Assessment?: No apparent visual deficits   Perception     Praxis      Cognition Arousal/Alertness: Awake/alert Behavior During Therapy: WFL for tasks assessed/performed Overall Cognitive Status: Within Functional Limits for tasks assessed                                          Exercises     Shoulder Instructions       General  Comments decrease in skin integrity    Pertinent Vitals/ Pain       Pain Assessment: No/denies pain  Home Living                                          Prior Functioning/Environment              Frequency  Min 2X/week        Progress Toward Goals  OT Goals(current goals can now be found in the care plan section)  Progress towards OT goals: Progressing toward goals  Acute  Rehab OT Goals Patient Stated Goal: TO get stronger and stay up OT Goal Formulation: With patient Time For Goal Achievement: 02/06/21 Potential to Achieve Goals: Good ADL Goals Pt Will Perform Grooming: with supervision;standing Pt Will Perform Upper Body Dressing: Independently Pt Will Perform Lower Body Dressing: with supervision;sit to/from stand Pt Will Transfer to Toilet: with supervision;ambulating Pt Will Perform Toileting - Clothing Manipulation and hygiene: with supervision;sit to/from stand Additional ADL Goal #1: Patient will score <4/28 on SBT indicating increased cognition.  Plan Discharge plan needs to be updated;Frequency remains appropriate    Co-evaluation                 AM-PAC OT "6 Clicks" Daily Activity     Outcome Measure   Help from another person eating meals?: None Help from another person taking care of personal grooming?: A Little Help from another person toileting, which includes using toliet, bedpan, or urinal?: A Little Help from another person bathing (including washing, rinsing, drying)?: A Little Help from another person to put on and taking off regular upper body clothing?: A Little Help from another person to put on and taking off regular lower body clothing?: A Little 6 Click Score: 19    End of Session Equipment Utilized During Treatment: Gait belt;Rolling walker  OT Visit Diagnosis: Unsteadiness on feet (R26.81);Muscle weakness (generalized) (M62.81)   Activity Tolerance Patient tolerated treatment well   Patient Left in chair;with call bell/phone within reach;Other (comment)   Nurse Communication   Need for call pad box, loose bowels        Time: 6294-7654 OT Time Calculation (min): 22 min  Charges: OT General Charges $OT Visit: 1 Visit OT Treatments $Self Care/Home Management : 8-22 mins  Joeseph Amor OTR/L  Acute Rehab Services  [phone] office number [phone] pager number    Joeseph Amor 01/26/2021,  12:03 PM

## 2021-01-26 NOTE — Progress Notes (Signed)
PROGRESS NOTE    [name]  IWL:[id number] DOB: [birth year] DOA: 01/21/2021 PCP: Kelton Pillar, MD   Brief Narrative: [name] is a 80 y.o. female with a history of tremors, hypertension, insomnia, chronic diarrhea, hyperlipidemia.  Patient presented secondary to confusion.  Unclear etiology for symptoms but patient appeared to return back close to baseline.  Neurology and psychiatry were consulted and recommended neuropsychology/neuropsychiatry follow-up.  MRI without acute changes and EEG without evidence of seizures.   Assessment & Plan:   Active Problems:   Essential tremor   AMS (altered mental status)   Pressure injury of skin   Acute kidney injury (nontraumatic) (HCC)   Acute metabolic encephalopathy Appears improved. Patient does seem to be having symptoms of delusional thoughts per discussion with daughter. It is possible patient has an underlying cognitive disorder that acutely worsened with delirium. Patient is oriented, however, at this time. Neurology and psychiatry recommend neuropsychology/neuropsychiatry follow-up as an outpatient and this was discussed with the patient's daughter. PT/OT recommending rehab for physical therapy.  AKI Baseline creatinine of about 0.8. Creatinine of 1.96 on admission with resolution.  Low-normal vitamin B12 Vitamin B12 of 198.   Essential tremor -Continue primidone  Insomnia Patient is on trazodone as an outpatient which is recommended to be discontinued.  Chronic diarrhea Stable. Improved currently.  Hypophosphatemia Hypomagnesemia Resolved.  Primary hypertension -Continue amlodipine and atenolol  Hyperlipidemia -Continue simvastatin  Thrombocytopenia Mild. Stable.  Pressure injury Bilateral buttock, POA.   DVT prophylaxis: Lovenox 40 mg subcutaneous Code Status:   Code Status: Full Code Family Communication: Daughter on telephone (25 minutes) Disposition Plan: Discharge to SNF vs CIR pending bed  availability. Medically stable for discharge.   Consultants:  Neurology Psychiatry  Procedures:  EEG (7/16) IMPRESSION: This study is within normal limits. No seizures or epileptiform discharges were seen throughout the recording.  Antimicrobials: Ceftriaxone IV    Subjective: No issues overnight. Feels like she is her normal self. Thinks this situation may have been related to a trauma/startle reaction secondary to a violent response of her dog to a visitor.  Objective: Vitals:   01/25/21 1551 01/25/21 1932 01/25/21 2330 01/26/21 0359  BP: (!) 150/68 (!) 155/71 (!) 131/57 (!) 153/77  Pulse: 87 93 71 74  Resp: 20 19 17 16   Temp:  99.6 F (37.6 C) 98.7 F (37.1 C) 98.9 F (37.2 C)  TempSrc:  Oral Oral Oral  SpO2: 98% 97% 97% 98%  Weight:      Height:       No intake or output data in the 24 hours ending 01/26/21 0925 Filed Weights   01/23/21 1716  Weight: 70.6 kg    Examination:  General exam: Appears calm and comfortable Respiratory system: Clear to auscultation. Respiratory effort normal. Cardiovascular system: S1 & S2 heard, RRR. No murmurs, rubs, gallops or clicks. Gastrointestinal system: Abdomen is nondistended, soft and nontender. No organomegaly or masses felt. Normal bowel sounds heard. Central nervous system: Alert and oriented. No focal neurological deficits. Musculoskeletal: No edema. No calf tenderness Skin: No cyanosis. No rashes Psychiatry: Judgement and insight appear normal. Mood & affect appropriate.     Data Reviewed: I have personally reviewed following labs and imaging studies  CBC Lab Results  Component Value Date   WBC 8.3 01/26/2021   RBC 3.76 (L) 01/26/2021   HGB 12.5 01/26/2021   HCT 36.0 01/26/2021   MCV 95.7 01/26/2021   MCH 33.2 01/26/2021   PLT 135 (L) 01/26/2021   MCHC  34.7 01/26/2021   RDW 13.0 01/26/2021   LYMPHSABS 2.0 01/26/2021   MONOABS 0.8 01/26/2021   EOSABS 0.1 01/26/2021   BASOSABS 0.0 01/26/2021      Last metabolic panel Lab Results  Component Value Date   NA 131 (L) 01/26/2021   K 4.3 01/26/2021   CL 101 01/26/2021   CO2 26 01/26/2021   BUN <5 (L) 01/26/2021   CREATININE 0.75 01/26/2021   GLUCOSE 104 (H) 01/26/2021   GFRNONAA >60 01/26/2021   GFRAA 56 (L) 05/03/2016   CALCIUM 8.5 (L) 01/26/2021   PHOS 4.1 01/26/2021   PROT 5.5 (L) 01/26/2021   ALBUMIN 2.9 (L) 01/26/2021   BILITOT 0.3 01/26/2021   ALKPHOS 58 01/26/2021   AST 23 01/26/2021   ALT 15 01/26/2021   ANIONGAP 4 (L) 01/26/2021    CBG (last 3)  No results for input(s): GLUCAP in the last 72 hours.   GFR: Estimated Creatinine Clearance: 54.1 mL/min (by C-G formula based on SCr of 0.75 mg/dL).  Coagulation Profile: No results for input(s): INR, PROTIME in the last 168 hours.  Recent Results (from the past 240 hour(s))  Urine Culture     Status: None   Collection Time: 01/21/21  1:37 AM   Specimen: Urine, Catheterized  Result Value Ref Range Status   Specimen Description URINE, CATHETERIZED  Final   Special Requests NONE  Final   Culture   Final    NO GROWTH Performed at [facility], [address], [place], [place] [address]    Report Status 01/23/2021 FINAL  Final  Resp Panel by RT-PCR (Flu A&B, Covid) Nasopharyngeal Swab     Status: None   Collection Time: 01/22/21  1:10 AM   Specimen: Nasopharyngeal Swab; Nasopharyngeal(NP) swabs in vial transport medium  Result Value Ref Range Status   SARS Coronavirus 2 by RT PCR NEGATIVE NEGATIVE Final    Comment: (NOTE) SARS-CoV-2 target nucleic acids are NOT DETECTED.  The SARS-CoV-2 RNA is generally detectable in upper respiratory specimens during the acute phase of infection. The lowest concentration of SARS-CoV-2 viral copies this assay can detect is 138 copies/mL. A negative result does not preclude SARS-Cov-2 infection and should not be used as the sole basis for treatment or other patient management decisions. A negative result may occur  with  improper specimen collection/handling, submission of specimen other than nasopharyngeal swab, presence of viral mutation(s) within the areas targeted by this assay, and inadequate number of viral copies(<138 copies/mL). A negative result must be combined with clinical observations, patient history, and epidemiological information. The expected result is Negative.  Fact Sheet for Patients:  EntrepreneurPulse.com.au  Fact Sheet for Healthcare Providers:  IncredibleEmployment.be  This test is no t yet approved or cleared by the [place] FDA and  has been authorized for detection and/or diagnosis of SARS-CoV-2 by FDA under an Emergency Use Authorization (EUA). This EUA will remain  in effect (meaning this test can be used) for the duration of the COVID-19 declaration under Section 564(b)(1) of the Act, 21 U.S.C.section 360bbb-3(b)(1), unless the authorization is terminated  or revoked sooner.       Influenza A by PCR NEGATIVE NEGATIVE Final   Influenza B by PCR NEGATIVE NEGATIVE Final    Comment: (NOTE) The Xpert Xpress SARS-CoV-2/FLU/RSV plus assay is intended as an aid in the diagnosis of influenza from Nasopharyngeal swab specimens and should not be used as a sole basis for treatment. Nasal washings and aspirates are unacceptable for Xpert Xpress SARS-CoV-2/FLU/RSV  testing.  Fact Sheet for Patients: EntrepreneurPulse.com.au  Fact Sheet for Healthcare Providers: IncredibleEmployment.be  This test is not yet approved or cleared by the [place] FDA and has been authorized for detection and/or diagnosis of SARS-CoV-2 by FDA under an Emergency Use Authorization (EUA). This EUA will remain in effect (meaning this test can be used) for the duration of the COVID-19 declaration under Section 564(b)(1) of the Act, 21 U.S.C. section 360bbb-3(b)(1), unless the authorization is terminated  or revoked.  Performed at [facility], [place] [address] [place], [place] [address]   Culture, blood (routine x 2)     Status: None (Preliminary result)   Collection Time: 01/22/21  3:25 AM   Specimen: BLOOD RIGHT HAND  Result Value Ref Range Status   Specimen Description BLOOD RIGHT HAND  Final   Special Requests   Final    BOTTLES DRAWN AEROBIC AND ANAEROBIC Blood Culture adequate volume   Culture   Final    NO GROWTH 4 DAYS Performed at [facility], [place] [address], [place], [place] [address]    Report Status PENDING  Incomplete  Culture, blood (routine x 2)     Status: None (Preliminary result)   Collection Time: 01/22/21  4:15 AM   Specimen: BLOOD LEFT FOREARM  Result Value Ref Range Status   Specimen Description BLOOD LEFT FOREARM  Final   Special Requests   Final    BOTTLES DRAWN AEROBIC AND ANAEROBIC Blood Culture adequate volume   Culture   Final    NO GROWTH 4 DAYS Performed at [facility], [place] [address] [place], [place] [address]    Report Status PENDING  Incomplete  MRSA Next Gen by PCR, Nasal     Status: None   Collection Time: 01/22/21  5:20 AM   Specimen: Nasal Mucosa; Nasal Swab  Result Value Ref Range Status   MRSA by PCR Next Gen NOT DETECTED NOT DETECTED Final    Comment: (NOTE) The GeneXpert MRSA Assay (FDA approved for NASAL specimens only), is one component of a comprehensive MRSA colonization surveillance program. It is not intended to diagnose MRSA infection nor to guide or monitor treatment for MRSA infections. Test performance is not FDA approved in patients less than 81 years old. Performed at [facility], [place] [address] [place], [place] [address]   C Difficile Quick Screen w PCR reflex     Status: None   Collection Time: 01/24/21  5:51 AM   Specimen: STOOL  Result Value Ref Range Status   C Diff antigen NEGATIVE NEGATIVE Final   C Diff toxin NEGATIVE NEGATIVE Final   C Diff interpretation No C. difficile  detected.  Final    Comment: Performed at [facility], [place] [address] [place], [place] [address]  Gastrointestinal Panel by PCR , Stool     Status: None   Collection Time: 01/24/21  5:51 AM   Specimen: Stool  Result Value Ref Range Status   Campylobacter species NOT DETECTED NOT DETECTED Final   Plesimonas shigelloides NOT DETECTED NOT DETECTED Final   Salmonella species NOT DETECTED NOT DETECTED Final   Yersinia enterocolitica NOT DETECTED NOT DETECTED Final   Vibrio species NOT DETECTED NOT DETECTED Final   Vibrio cholerae NOT DETECTED NOT DETECTED Final   Enteroaggregative E coli (EAEC) NOT DETECTED NOT DETECTED Final   Enteropathogenic E coli (EPEC) NOT DETECTED NOT DETECTED Final   Enterotoxigenic E coli (ETEC) NOT DETECTED NOT DETECTED Final  Shiga like toxin producing E coli (STEC) NOT DETECTED NOT DETECTED Final   Shigella/Enteroinvasive E coli (EIEC) NOT DETECTED NOT DETECTED Final   Cryptosporidium NOT DETECTED NOT DETECTED Final   Cyclospora cayetanensis NOT DETECTED NOT DETECTED Final   Entamoeba histolytica NOT DETECTED NOT DETECTED Final   Giardia lamblia NOT DETECTED NOT DETECTED Final   Adenovirus F40/41 NOT DETECTED NOT DETECTED Final   Astrovirus NOT DETECTED NOT DETECTED Final   Norovirus GI/GII NOT DETECTED NOT DETECTED Final   Rotavirus A NOT DETECTED NOT DETECTED Final   Sapovirus (I, II, IV, and V) NOT DETECTED NOT DETECTED Final    Comment: Performed at [facility], [address], [place], [place] [address]        Radiology Studies: No results found.      Scheduled Meds:  amLODipine  5 mg Oral Daily   atenolol  25 mg Oral Daily   cyanocobalamin  1,000 mcg Intramuscular Daily   enoxaparin (LOVENOX) injection  40 mg Subcutaneous H83U   folic acid  1 mg Oral Daily   LORazepam  0-4 mg Oral Q6H   Followed by   Derrill Memo ON 01/27/2021] LORazepam  0-4 mg Oral Q12H   multivitamin with minerals  1 tablet Oral Daily   pregabalin  75 mg  Oral Daily   primidone  25 mg Oral BID   simvastatin  40 mg Oral QHS   thiamine  100 mg Oral Daily   Or   thiamine  100 mg Intravenous Daily   [START ON 01/27/2021] vitamin B-12  500 mcg Oral Daily   Continuous Infusions:   LOS: 4 days     Cordelia Poche, MD [facility] 01/26/2021, 9:25 AM  If 7PM-7AM, please contact night-coverage [email or web address]

## 2021-01-27 DIAGNOSIS — I10 Essential (primary) hypertension: Secondary | ICD-10-CM | POA: Diagnosis not present

## 2021-01-27 DIAGNOSIS — F5104 Psychophysiologic insomnia: Secondary | ICD-10-CM | POA: Diagnosis not present

## 2021-01-27 DIAGNOSIS — R4182 Altered mental status, unspecified: Secondary | ICD-10-CM | POA: Diagnosis not present

## 2021-01-27 DIAGNOSIS — G250 Essential tremor: Secondary | ICD-10-CM | POA: Diagnosis not present

## 2021-01-27 DIAGNOSIS — G9341 Metabolic encephalopathy: Secondary | ICD-10-CM | POA: Diagnosis not present

## 2021-01-27 DIAGNOSIS — Z87448 Personal history of other diseases of urinary system: Secondary | ICD-10-CM | POA: Diagnosis not present

## 2021-01-27 DIAGNOSIS — J309 Allergic rhinitis, unspecified: Secondary | ICD-10-CM | POA: Diagnosis not present

## 2021-01-27 DIAGNOSIS — L308 Other specified dermatitis: Secondary | ICD-10-CM | POA: Diagnosis not present

## 2021-01-27 DIAGNOSIS — M1710 Unilateral primary osteoarthritis, unspecified knee: Secondary | ICD-10-CM | POA: Diagnosis not present

## 2021-01-27 DIAGNOSIS — G4733 Obstructive sleep apnea (adult) (pediatric): Secondary | ICD-10-CM | POA: Diagnosis not present

## 2021-01-27 DIAGNOSIS — N179 Acute kidney failure, unspecified: Secondary | ICD-10-CM | POA: Diagnosis not present

## 2021-01-27 DIAGNOSIS — L8990 Pressure ulcer of unspecified site, unspecified stage: Secondary | ICD-10-CM | POA: Diagnosis not present

## 2021-01-27 DIAGNOSIS — R251 Tremor, unspecified: Secondary | ICD-10-CM | POA: Diagnosis not present

## 2021-01-27 DIAGNOSIS — S31819A Unspecified open wound of right buttock, initial encounter: Secondary | ICD-10-CM | POA: Diagnosis not present

## 2021-01-27 DIAGNOSIS — E7800 Pure hypercholesterolemia, unspecified: Secondary | ICD-10-CM | POA: Diagnosis not present

## 2021-01-27 DIAGNOSIS — E559 Vitamin D deficiency, unspecified: Secondary | ICD-10-CM | POA: Diagnosis not present

## 2021-01-27 DIAGNOSIS — M797 Fibromyalgia: Secondary | ICD-10-CM | POA: Diagnosis not present

## 2021-01-27 LAB — CULTURE, BLOOD (ROUTINE X 2)
Culture: NO GROWTH
Culture: NO GROWTH
Special Requests: ADEQUATE
Special Requests: ADEQUATE

## 2021-01-27 LAB — SARS CORONAVIRUS 2 (TAT 6-24 HRS): SARS Coronavirus 2: NEGATIVE

## 2021-01-27 MED ORDER — TRAMADOL HCL 50 MG PO TABS: 100.0000 mg | ORAL_TABLET | Freq: Two times a day (BID) | ORAL | 0 refills | Status: DC | PRN

## 2021-01-27 MED ORDER — CYANOCOBALAMIN 500 MCG PO TABS: 500.0000 ug | ORAL_TABLET | Freq: Every day | ORAL | Status: DC

## 2021-01-27 NOTE — Discharge Instructions (Signed)
[name],  You were in the hospital with confusion. This appears to have improved. Neurology and psychiatry have recommended Neuropsychiatry/neuropsychology follow-up and evalutation.

## 2021-01-27 NOTE — Progress Notes (Signed)
Report called to supervisor at [facility]. Patient's daughter Arbie Cookey called and made aware. Awaiting [name]'s arrival for transport.

## 2021-01-27 NOTE — Progress Notes (Signed)
Patient refused cage aid and substance abuse resources

## 2021-01-27 NOTE — Progress Notes (Signed)
Attempt to call report to [facility]. I spoke with supervisor at facility. She took my name and number and said the nurse taking Congetta Odriscoll will call me back. I made her aware that [facility] had already been called.

## 2021-01-27 NOTE — TOC Transition Note (Signed)
Transition of Care [facility]) - CM/SW Discharge Note   Patient Details  Name: [name] MRN: [id number] Date of Birth: 1940-11-15  Transition of [facility]) CM/SW Contact:  Angelita Ingles, RN Phone Number:[phone]  01/27/2021, 4:03 PM   Clinical Narrative:    Patient to discharge to [facility]. Covid test negative. Daughter Arbie Cookey to transport patient to [facility]. Discharge summary has been faxed.  Please call report to [facility] # [phone]   Final next level of care: Skilled Nursing Facility Barriers to Discharge: No Barriers Identified   Patient Goals and CMS Choice Patient states their goals for this hospitalization and ongoing recovery are:: return home with daughter CMS Medicare.gov Compare Post Acute Care list provided to:: Patient Choice offered to / list presented to : Patient  Discharge Placement                Patient to be transferred to facility by: Private vehicle Name of family member notified: Altha Harm [phone] Patient and family notified of of transfer: 01/27/21  Discharge Plan and Services In-house Referral: Clinical Social Work Discharge Planning Services: [employer] Consult            DME Arranged: N/A DME Agency: NA       HH Arranged: NA HH Agency: NA        Social Determinants of Health (SDOH) Interventions     Readmission Risk Interventions No flowsheet data found.

## 2021-01-27 NOTE — TOC Progression Note (Addendum)
Transition of Care [facility]) - Progression Note    Patient Details  Name: [name] MRN: [id number] Date of Birth: 1941/06/10  Transition of Care [facility]) CM/SW [place], RN Phone Number:[phone]  01/27/2021, 10:15 AM  Clinical Narrative:    Insurance auth approved  for 7 days. Auth # [id number]. Patient and daughter at bedside made aware. Patient states that she is on waiting list at [facility] and would like to know if they have short term rehab beds available. CM has sent request via hub and left message for SW at [facility]. Will continue with d/c plan. CM has requested covid test.   1120 Patient is now agreeable to go to [facility] and does not want to pursue [facility].   Expected Discharge Plan: IP Rehab Facility Barriers to Discharge: Continued Medical Work up  Expected Discharge Plan and Services Expected Discharge Plan: [place] In-house Referral: Clinical Social Work Discharge Planning Services: CM Consult   Living arrangements for the past 2 months: Single Family Home                 DME Arranged: N/A DME Agency: NA       HH Arranged: NA HH Agency: NA         Social Determinants of Health (SDOH) Interventions    Readmission Risk Interventions No flowsheet data found.

## 2021-01-27 NOTE — Discharge Summary (Signed)
Physician Discharge Summary  [name] KYH:[id number] DOB: [birth year] DOA: 01/21/2021  PCP: Kelton Pillar, MD  Admit date: 01/21/2021 Discharge date: 01/27/2021  Admitted From: Home Disposition: SNF  Recommendations for Outpatient Follow-up:  Follow up with PCP in 1 week Recommend follow-up with neuropsychiatry/neuropsychologist; discussed with daughter who will arrange Please follow up on the following pending results: None   Discharge Condition: Stable CODE STATUS: Full code Diet recommendation: Regular diet   Brief/Interim Summary:  Admission HPI written by Kayleen Memos, DO   HPI: [name] is a 80 y.o. female with medical history significant for essential tremors on primidone (started January 2022), fibromyalgia, essential hypertension, hyperlipidemia, chronic insomnia, who presented to [facility] ED from home via EMS after being found confused on the commode.  History is mainly obtained from her daughter at bedside.  At the time of this visit patient is back to her baseline mentation.  Per her daughter, she first noted mild confusion around midnight on Thursday while talking on the phone with her mother.  Her daughter who works night shift called and noted that her answers were out of character for her.  At 7 AM the same day she clearly noted that the confusion was worse.  Answering questions inappropriately and the patient did not want to see her.  She recently lost possession of a german sheppard dog she recently adopted and she was sad about it.  Thursday evening around 10:30 PM her daughter became concerned, she came to her mother's house however her mother would not open the door, she left.     She called her mother again the following day, Friday, the day of her presentation but she would not answer the call so she presented to the patient's house with officers from the Police [employer] around 5:30 PM.  They found her sitting on the commode.  It appeared she may have been  sitting there for hours.  No urine or stools in the commode.  She was nonverbal.  No urinary symptoms.  No chest pain or dyspnea.  EMS was activated and she was brought to the ED for further evaluation.  Her mentation gradually improved and returned to baseline.  CT head was negative, urine analysis negative.  Troponin were elevated and uptrending from 231 to 246.  Due to concern for sepsis, patient received IV fluid bolus 1L NS and was started on IV antibiotics, Rocephin, empirically.  Self-reported poor appetite and poor oral intake for the past 2 days.  TR[facility], hospitalist team, was asked to admit.   Hospital course:  Acute metabolic encephalopathy Appears improved. Patient does seem to be having symptoms of delusional thoughts per discussion with daughter. It is possible patient has an underlying cognitive disorder that acutely worsened with delirium. Patient is oriented, however, at this time. Neurology and psychiatry recommend neuropsychology/neuropsychiatry follow-up as an outpatient and this was discussed with the patient's daughter. PT/OT recommending rehab for physical therapy.   AKI Baseline creatinine of about 0.8. Creatinine of 1.96 on admission with resolution.   Low-normal vitamin B12 Vitamin B12 of 198.    Essential tremor Continue primidone   Insomnia Patient is on trazodone as an outpatient which is recommended to be discontinued be neurology. If patient develops insomnia could consider melatonin. No new medications were started.   Chronic diarrhea Stable. Improved currently.   Hypophosphatemia Hypomagnesemia Resolved.   Primary hypertension Continue amlodipine and atenolol   Hyperlipidemia Continue simvastatin   Thrombocytopenia Mild. Stable.   Pressure  injury Bilateral buttock, POA.  Discharge Diagnoses:  Active Problems:   Essential tremor   AMS (altered mental status)   Pressure injury of skin   Acute kidney injury (nontraumatic) [facility])    Discharge  Instructions  Discharge Instructions     Increase activity slowly   Complete by: As directed    No wound care   Complete by: As directed       Allergies as of 01/27/2021       Reactions   Hctz [hydrochlorothiazide] Other (See Comments)   Hyponatremia    Lisinopril Other (See Comments)   Hyponatremia   Sulfa Antibiotics Rash        Medication List     STOP taking these medications    traZODone 50 MG tablet Commonly known as: DESYREL       TAKE these medications    amLODipine 5 MG tablet Commonly known as: NORVASC TAKE ONE TABLET BY MOUTH DAILY   atenolol 25 MG tablet Commonly known as: TENORMIN TAKE ONE TABLET BY MOUTH DAILY   diclofenac Sodium 1 % Gel Commonly known as: VOLTAREN Apply 4 g topically 4 (four) times daily.   pregabalin 75 MG capsule Commonly known as: LYRICA Take 75 mg by mouth daily.   primidone 50 MG tablet Commonly known as: MYSOLINE 0.5 tab at night time, increase to bid after 30 days. What changed:  how much to take how to take this when to take this additional instructions   simvastatin 40 MG tablet Commonly known as: ZOCOR Take 1 tablet (40 mg total) by mouth at bedtime.   traMADol 50 MG tablet Commonly known as: ULTRAM Take 2 tablets (100 mg total) by mouth every 12 (twelve) hours as needed. What changed:  how much to take how to take this when to take this reasons to take this additional instructions   vitamin B-12 500 MCG tablet Commonly known as: CYANOCOBALAMIN Take 1 tablet (500 mcg total) by mouth daily. Start taking on: January 28, 2021        Follow-up Information     Kelton Pillar, MD. Schedule an appointment as soon as possible for a visit in 1 week(s).   Specialty: Family Medicine Why: For hospital follow-up Contact information: [address] [place] [address] [phone]                Allergies  Allergen Reactions   Hctz [Hydrochlorothiazide] Other (See  Comments)    Hyponatremia    Lisinopril Other (See Comments)    Hyponatremia   Sulfa Antibiotics Rash    Consultations: Neurology Psychiatry   Procedures/Studies: CT ABDOMEN PELVIS WO CONTRAST  Result Date: 01/22/2021 CLINICAL DATA:  Acute unspecified abdominal pain EXAM: CT ABDOMEN AND PELVIS WITHOUT CONTRAST TECHNIQUE: Multidetector CT imaging of the abdomen and pelvis was performed following the standard protocol without IV contrast. COMPARISON:  None. FINDINGS: Lower chest: The visualized lung bases are clear bilaterally. Mild coronary artery calcification. Global cardiac size within normal limits. Hepatobiliary: No focal liver abnormality is seen. No gallstones, gallbladder wall thickening, or biliary dilatation. Pancreas: Unremarkable Spleen: Unremarkable Adrenals/Urinary Tract: Adrenal glands are unremarkable. Kidneys are normal, without renal calculi, focal lesion, or hydronephrosis. Bladder is unremarkable. Stomach/Bowel: Stomach is within normal limits. Appendix appears normal. No evidence of bowel wall thickening, distention, or inflammatory changes. Vascular/Lymphatic: Extensive aortoiliac atherosclerotic calcification. No aortic aneurysm. No pathologic adenopathy within the abdomen and pelvis. Reproductive: Uterus and bilateral adnexa are unremarkable. Other: Small fat containing left inguinal hernia.  The rectum is unremarkable. Musculoskeletal: The osseous structures are age-appropriate. No acute bone abnormality. No lytic or blastic bone lesion. IMPRESSION: No acute intra-abdominal pathology identified. No definite radiographic explanation for the patient's reported symptoms. Aortic Atherosclerosis (ICD10-I70.0). Electronically Signed   By: Fidela Salisbury MD   On: 01/22/2021 03:44   CT Head Wo Contrast  Result Date: 01/21/2021 CLINICAL DATA:  Mental status change EXAM: CT HEAD WITHOUT CONTRAST TECHNIQUE: Contiguous axial images were obtained from the base of the skull through the  vertex without intravenous contrast. COMPARISON:  CT brain 04/03/2013 FINDINGS: Brain: No acute territorial infarction, hemorrhage or intracranial mass. Mild atrophy. Nonenlarged ventricles. Minimal patchy white matter hypodensity consistent with chronic small vessel ischemic change. Vascular: Dense vessels.  Scattered carotid vascular calcification Skull: Normal. Negative for fracture or focal lesion. Sinuses/Orbits: No acute finding. Other: None IMPRESSION: 1. No CT evidence for acute intracranial abnormality. 2. Mild atrophy and chronic small vessel ischemic change of the white matter Electronically Signed   By: Donavan Foil M.D.   On: 01/21/2021 22:21   MR BRAIN WO CONTRAST  Result Date: 01/23/2021 CLINICAL DATA:  Mental status change EXAM: MRI HEAD WITHOUT CONTRAST TECHNIQUE: Multiplanar, multiecho pulse sequences of the brain and surrounding structures were obtained without intravenous contrast. COMPARISON:  CT head 01/21/2021 FINDINGS: Brain: Ventricle size and cerebral volume normal for age. Moderate white matter changes with scattered white matter hyperintensities throughout both cerebral hemispheres. Brainstem and cerebellum normal. Negative for hemorrhage or mass. Negative for acute infarct. Vascular: Normal arterial flow voids. Skull and upper cervical spine: Negative Sinuses/Orbits: Negative Other: None IMPRESSION: No acute abnormality. Moderate white matter changes consistent with chronic microvascular ischemia. Electronically Signed   By: Franchot Gallo M.D.   On: 01/23/2021 15:53   DG Chest Portable 1 View  Result Date: 01/21/2021 CLINICAL DATA:  Altered level of consciousness EXAM: PORTABLE CHEST 1 VIEW COMPARISON:  01/06/2021 FINDINGS: Single frontal view of the chest demonstrates an unremarkable cardiac silhouette. No airspace disease, effusion, or pneumothorax. IMPRESSION: 1. No acute intrathoracic process. Electronically Signed   By: Randa Ngo M.D.   On: 01/21/2021 21:19   DG  Chest Portable 1 View  Result Date: 01/06/2021 CLINICAL DATA:  Chest pain EXAM: PORTABLE CHEST 1 VIEW COMPARISON:  05/05/2020 FINDINGS: Heart and mediastinal contours are within normal limits. No focal opacities or effusions. No acute bony abnormality. IMPRESSION: No active disease. Electronically Signed   By: Rolm Baptise M.D.   On: 01/06/2021 18:55   EEG adult  Result Date: 01/22/2021 Lora Havens, MD     01/22/2021  3:24 PM Patient Name: [name] MRN: [id number] Epilepsy Attending: Lora Havens Referring Physician/Provider: Dr Irene Pap Date: 01/22/2021 Duration: 23.18 mins Patient history: 80yo F with ams. EEG to evaluate for seizure Level of alertness: Awake, asleep AEDs during EEG study: None Technical aspects: This EEG study was done with scalp electrodes positioned according to the 10-20 International system of electrode placement. Electrical activity was acquired at a sampling rate of 500Hz  and reviewed with a high frequency filter of 70Hz  and a low frequency filter of 1Hz . EEG data were recorded continuously and digitally stored. Description: The posterior dominant rhythm consists of 8-9Hz  activity of moderate voltage (25-35 uV) seen predominantly in posterior head regions, symmetric and reactive to eye opening and eye closing. Sleep was characterized by vertex waves, sleep spindles (12 to 14 Hz), maximal frontocentral region. Physiologic photic driving was seen during photic stimulation.  Hyperventilation was not performed.  IMPRESSION: This study is within normal limits. No seizures or epileptiform discharges were seen throughout the recording. Lora Havens   ECHOCARDIOGRAM COMPLETE  Result Date: 01/22/2021    ECHOCARDIOGRAM REPORT   Patient Name:   KERRYANN ALLAIRE [facility] Date of Exam: 01/22/2021 Medical Rec #:  [id number]    Height:       67.0 in Accession #:    [id number]   Weight:       156.0 lb Date of Birth:  1941/05/07    BSA:          1.820 m Patient Age:    28 years     BP:            83/61 mmHg Patient Gender: F            HR:           80 bpm. Exam Location:  Inpatient Procedure: 2D Echo, Cardiac Doppler and Color Doppler Indications:    Elevated Troponin  History:        Patient has prior history of Echocardiogram examinations, most                 recent 01/08/2018. Signs/Symptoms:Murmur and Dyspnea; Risk                 Factors:Hypertension and Dyslipidemia. CKD. GERD.  Sonographer:    Jonelle Sidle Dance Referring Phys: [id number] Angelica  1. Murmur secondary to hyperdynamic LV contraction with turbulence through outflow tract. There is no significant outflow tract gradient, however.  2. Left ventricular ejection fraction, by estimation, is >75%. The left ventricle has hyperdynamic function. The left ventricle has no regional wall motion abnormalities. Left ventricular diastolic parameters are consistent with Grade I diastolic dysfunction (impaired relaxation).  3. Right ventricular systolic function is normal. The right ventricular size is normal. There is normal pulmonary artery systolic pressure. The estimated right ventricular systolic pressure is 96.2 mmHg.  4. Left atrial size was mildly dilated.  5. The mitral valve is normal in structure. Trivial mitral valve regurgitation. No evidence of mitral stenosis.  6. The aortic valve is normal in structure. Aortic valve regurgitation is not visualized. No aortic stenosis is present.  7. The inferior vena cava is normal in size with greater than 50% respiratory variability, suggesting right atrial pressure of 3 mmHg. FINDINGS  Left Ventricle: Left ventricular ejection fraction, by estimation, is >75%. The left ventricle has hyperdynamic function. The left ventricle has no regional wall motion abnormalities. The left ventricular internal cavity size was normal in size. There is no left ventricular hypertrophy. Left ventricular diastolic parameters are consistent with Grade I diastolic dysfunction (impaired relaxation). Right  Ventricle: The right ventricular size is normal. No increase in right ventricular wall thickness. Right ventricular systolic function is normal. There is normal pulmonary artery systolic pressure. The tricuspid regurgitant velocity is 2.16 m/s, and  with an assumed right atrial pressure of 3 mmHg, the estimated right ventricular systolic pressure is 83.6 mmHg. Left Atrium: Left atrial size was mildly dilated. Right Atrium: Right atrial size was normal in size. Pericardium: There is no evidence of pericardial effusion. Mitral Valve: The mitral valve is normal in structure. Trivial mitral valve regurgitation. No evidence of mitral valve stenosis. Tricuspid Valve: The tricuspid valve is normal in structure. Tricuspid valve regurgitation is trivial. No evidence of tricuspid stenosis. Aortic Valve: The aortic valve is normal in structure. Aortic valve regurgitation is not visualized. No aortic stenosis is present. Pulmonic Valve: The  pulmonic valve was normal in structure. Pulmonic valve regurgitation is not visualized. No evidence of pulmonic stenosis. Aorta: The aortic root is normal in size and structure. Venous: The inferior vena cava is normal in size with greater than 50% respiratory variability, suggesting right atrial pressure of 3 mmHg. IAS/Shunts: No atrial level shunt detected by color flow Doppler.  LEFT VENTRICLE PLAX 2D LVIDd:         4.40 cm LVIDs:         3.20 cm LV PW:         0.90 cm LV IVS:        1.10 cm LVOT diam:     1.60 cm LV SV:         53 LV SV Index:   29 LVOT Area:     2.01 cm  RIGHT VENTRICLE             IVC RV Basal diam:  2.90 cm     IVC diam: 1.60 cm RV S prime:     14.40 cm/s TAPSE (M-mode): 1.6 cm LEFT ATRIUM             Index       RIGHT ATRIUM           Index LA diam:        3.80 cm 2.09 cm/m  RA Area:     10.80 cm LA Vol (A2C):   68.2 ml 37.48 ml/m RA Volume:   22.80 ml  12.53 ml/m LA Vol (A4C):   73.6 ml 40.45 ml/m LA Biplane Vol: 72.8 ml 40.01 ml/m  AORTIC VALVE LVOT Vmax:    138.50 cm/s LVOT Vmean:  90.700 cm/s LVOT VTI:    0.264 m  AORTA Ao Root diam: 3.00 cm Ao Asc diam:  2.60 cm MITRAL VALVE               TRICUSPID VALVE MV Area (PHT): 2.83 cm    TR Peak grad:   18.7 mmHg MV Decel Time: 268 msec    TR Vmax:        216.00 cm/s MV E velocity: 77.10 cm/s MV A velocity: 99.10 cm/s  SHUNTS MV E/A ratio:  0.78        Systemic VTI:  0.26 m                            Systemic Diam: 1.60 cm Candee Furbish MD Electronically signed by Candee Furbish MD Signature Date/Time: 01/22/2021/11:37:28 AM    Final       Subjective: No concerns this morning except trying to keep pressure off of her pressure injury.  Discharge Exam: Vitals:   01/26/21 2007 01/27/21 1223  BP: 135/70 (!) 132/52  Pulse: 92 85  Resp: 19 16  Temp: 98.7 F (37.1 C) 98.6 F (37 C)  SpO2: 97% 99%   Vitals:   01/26/21 0359 01/26/21 1905 01/26/21 2007 01/27/21 1223  BP: (!) 153/77  135/70 (!) 132/52  Pulse: 74  92 85  Resp: 16  19 16   Temp: 98.9 F (37.2 C)  98.7 F (37.1 C) 98.6 F (37 C)  TempSrc: Oral   Oral  SpO2: 98% 97% 97% 99%  Weight:      Height:        General: Pt is alert, awake, not in acute distress Cardiovascular: RRR, S1/S2 +, no rubs, no gallops Respiratory: CTA bilaterally, no wheezing, no rhonchi Abdominal: Soft, NT, ND,  bowel sounds + Extremities: no edema, no cyanosis    The results of significant diagnostics from this hospitalization (including imaging, microbiology, ancillary and laboratory) are listed below for reference.     Microbiology: Recent Results (from the past 240 hour(s))  Urine Culture     Status: None   Collection Time: 01/21/21  1:37 AM   Specimen: Urine, Catheterized  Result Value Ref Range Status   Specimen Description URINE, CATHETERIZED  Final   Special Requests NONE  Final   Culture   Final    NO GROWTH Performed at [facility], [address] [place], [place] [address]    Report Status 01/23/2021 FINAL  Final  Resp Panel by RT-PCR  (Flu A&B, Covid) Nasopharyngeal Swab     Status: None   Collection Time: 01/22/21  1:10 AM   Specimen: Nasopharyngeal Swab; Nasopharyngeal(NP) swabs in vial transport medium  Result Value Ref Range Status   SARS Coronavirus 2 by RT PCR NEGATIVE NEGATIVE Final    Comment: (NOTE) SARS-CoV-2 target nucleic acids are NOT DETECTED.  The SARS-CoV-2 RNA is generally detectable in upper respiratory specimens during the acute phase of infection. The lowest concentration of SARS-CoV-2 viral copies this assay can detect is 138 copies/mL. A negative result does not preclude SARS-Cov-2 infection and should not be used as the sole basis for treatment or other patient management decisions. A negative result may occur with  improper specimen collection/handling, submission of specimen other than nasopharyngeal swab, presence of viral mutation(s) within the areas targeted by this assay, and inadequate number of viral copies(<138 copies/mL). A negative result must be combined with clinical observations, patient history, and epidemiological information. The expected result is Negative.  Fact Sheet for Patients:  EntrepreneurPulse.com.au  Fact Sheet for Healthcare Providers:  IncredibleEmployment.be  This test is no t yet approved or cleared by the [place] FDA and  has been authorized for detection and/or diagnosis of SARS-CoV-2 by FDA under an Emergency Use Authorization (EUA). This EUA will remain  in effect (meaning this test can be used) for the duration of the COVID-19 declaration under Section 564(b)(1) of the Act, 21 U.S.C.section 360bbb-3(b)(1), unless the authorization is terminated  or revoked sooner.       Influenza A by PCR NEGATIVE NEGATIVE Final   Influenza B by PCR NEGATIVE NEGATIVE Final    Comment: (NOTE) The Xpert Xpress SARS-CoV-2/FLU/RSV plus assay is intended as an aid in the diagnosis of influenza from Nasopharyngeal swab specimens  and should not be used as a sole basis for treatment. Nasal washings and aspirates are unacceptable for Xpert Xpress SARS-CoV-2/FLU/RSV testing.  Fact Sheet for Patients: EntrepreneurPulse.com.au  Fact Sheet for Healthcare Providers: IncredibleEmployment.be  This test is not yet approved or cleared by the [place] FDA and has been authorized for detection and/or diagnosis of SARS-CoV-2 by FDA under an Emergency Use Authorization (EUA). This EUA will remain in effect (meaning this test can be used) for the duration of the COVID-19 declaration under Section 564(b)(1) of the Act, 21 U.S.C. section 360bbb-3(b)(1), unless the authorization is terminated or revoked.  Performed at [facility], [place] [address], [place], [place] [address]   Culture, blood (routine x 2)     Status: None (Preliminary result)   Collection Time: 01/22/21  3:25 AM   Specimen: BLOOD RIGHT HAND  Result Value Ref Range Status   Specimen Description BLOOD RIGHT HAND  Final   Special Requests   Final    BOTTLES DRAWN  AEROBIC AND ANAEROBIC Blood Culture adequate volume   Culture   Final    NO GROWTH 4 DAYS Performed at [place] [address] [place], [place] [address]    Report Status PENDING  Incomplete  Culture, blood (routine x 2)     Status: None (Preliminary result)   Collection Time: 01/22/21  4:15 AM   Specimen: BLOOD LEFT FOREARM  Result Value Ref Range Status   Specimen Description BLOOD LEFT FOREARM  Final   Special Requests   Final    BOTTLES DRAWN AEROBIC AND ANAEROBIC Blood Culture adequate volume   Culture   Final    NO GROWTH 4 DAYS Performed at [facility], [place] [address] [place], [place] [address]    Report Status PENDING  Incomplete  MRSA Next Gen by PCR, Nasal     Status: None   Collection Time: 01/22/21  5:20 AM   Specimen: Nasal Mucosa; Nasal Swab  Result Value Ref Range Status   MRSA by PCR Next Gen NOT DETECTED NOT  DETECTED Final    Comment: (NOTE) The GeneXpert MRSA Assay (FDA approved for NASAL specimens only), is one component of a comprehensive MRSA colonization surveillance program. It is not intended to diagnose MRSA infection nor to guide or monitor treatment for MRSA infections. Test performance is not FDA approved in patients less than 83 years old. Performed at [facility], [place] [address] [place], [place] [address]   C Difficile Quick Screen w PCR reflex     Status: None   Collection Time: 01/24/21  5:51 AM   Specimen: STOOL  Result Value Ref Range Status   C Diff antigen NEGATIVE NEGATIVE Final   C Diff toxin NEGATIVE NEGATIVE Final   C Diff interpretation No C. difficile detected.  Final    Comment: Performed at [facility], [place] [address], [place], [place] [address]  Gastrointestinal Panel by PCR , Stool     Status: None   Collection Time: 01/24/21  5:51 AM   Specimen: Stool  Result Value Ref Range Status   Campylobacter species NOT DETECTED NOT DETECTED Final   Plesimonas shigelloides NOT DETECTED NOT DETECTED Final   Salmonella species NOT DETECTED NOT DETECTED Final   Yersinia enterocolitica NOT DETECTED NOT DETECTED Final   Vibrio species NOT DETECTED NOT DETECTED Final   Vibrio cholerae NOT DETECTED NOT DETECTED Final   Enteroaggregative E coli (EAEC) NOT DETECTED NOT DETECTED Final   Enteropathogenic E coli (EPEC) NOT DETECTED NOT DETECTED Final   Enterotoxigenic E coli (ETEC) NOT DETECTED NOT DETECTED Final   Shiga like toxin producing E coli (STEC) NOT DETECTED NOT DETECTED Final   Shigella/Enteroinvasive E coli (EIEC) NOT DETECTED NOT DETECTED Final   Cryptosporidium NOT DETECTED NOT DETECTED Final   Cyclospora cayetanensis NOT DETECTED NOT DETECTED Final   Entamoeba histolytica NOT DETECTED NOT DETECTED Final   Giardia lamblia NOT DETECTED NOT DETECTED Final   Adenovirus F40/41 NOT DETECTED NOT DETECTED Final   Astrovirus NOT DETECTED NOT DETECTED  Final   Norovirus GI/GII NOT DETECTED NOT DETECTED Final   Rotavirus A NOT DETECTED NOT DETECTED Final   Sapovirus (I, II, IV, and V) NOT DETECTED NOT DETECTED Final    Comment: Performed at [facility], [place] [place], [place] [address]     Labs: BNP (last 3 results) No results for input(s): BNP in the last 8760 hours. Basic Metabolic Panel: Recent Labs  Lab 01/22/21 0500  01/23/21 0332 01/24/21 0905 01/25/21 0143 01/25/21 0930 01/25/21 1842 01/26/21 0233  NA 133*  136 131* 135 130*  --  131* 131*  K 3.4*  3.4* 3.0* 3.2* 4.4  --  3.9 4.3  CL 100 93* 99 100  --  99 101  CO2 16* 27 28 24   --  24 26  GLUCOSE 112* 109* 124* 120*  --  155* 104*  BUN 28* 13 8 5*  --  <5* <5*  CREATININE 1.37* 0.89 0.76 0.69  --  0.64 0.75  CALCIUM 8.7* 8.5* 8.5* 8.6*  --  8.7* 8.5*  MG 1.8 1.8 1.9 1.6* 1.6*  --  2.0  PHOS 2.8 1.9* 2.8 2.4*  --   --  4.1   Liver Function Tests: Recent Labs  Lab 01/23/21 0332 01/24/21 0905 01/25/21 0143 01/25/21 1842 01/26/21 0233  AST 42* 35 29 28 23   ALT 16 18 17 18 15   ALKPHOS 67 70 64 62 58  BILITOT 0.9 0.3 0.6 0.5 0.3  PROT 5.7* 5.8* 6.2* 6.1* 5.5*  ALBUMIN 3.1* 3.3* 3.2* 3.3* 2.9*   No results for input(s): LIPASE, AMYLASE in the last 168 hours. Recent Labs  Lab 01/21/21 2047 01/24/21 0905  AMMONIA 19 25   CBC: Recent Labs  Lab 01/21/21 2046 01/22/21 0500 01/23/21 0332 01/24/21 0905 01/25/21 0143 01/26/21 0233  WBC 17.3* 12.6* 6.8 5.7 8.1 8.3  NEUTROABS 14.8*  --  4.1 4.0 5.6 5.3  HGB 16.5* 13.3  12.9 13.4 13.4 13.0 12.5  HCT 46.8* 37.6  38.0 37.7 39.5 37.3 36.0  MCV 94.4 94.0 92.6 96.6 95.2 95.7  PLT 318 202 146* 130* 137* 135*   Cardiac Enzymes: No results for input(s): CKTOTAL, CKMB, CKMBINDEX, TROPONINI in the last 168 hours. BNP: Invalid input(s): POCBNP CBG: No results for input(s): GLUCAP in the last 168 hours. D-Dimer No results for input(s): DDIMER in the last 72 hours. Hgb A1c No results for  input(s): HGBA1C in the last 72 hours. Lipid Profile No results for input(s): CHOL, HDL, LDLCALC, TRIG, CHOLHDL, LDLDIRECT in the last 72 hours. Thyroid function studies No results for input(s): TSH, T4TOTAL, T3FREE, THYROIDAB in the last 72 hours.  Invalid input(s): FREET3 Anemia work up No results for input(s): VITAMINB12, FOLATE, FERRITIN, TIBC, IRON, RETICCTPCT in the last 72 hours. Urinalysis    Component Value Date/Time   COLORURINE YELLOW 01/21/2021 2046   APPEARANCEUR CLEAR 01/21/2021 2046   LABSPEC 1.020 01/21/2021 2046   PHURINE 5.0 01/21/2021 2046   GLUCOSEU NEGATIVE 01/21/2021 2046   HGBUR MODERATE (A) 01/21/2021 2046   BILIRUBINUR NEGATIVE 01/21/2021 2046   KETONESUR 80 (A) 01/21/2021 2046   PROTEINUR 100 (A) 01/21/2021 2046   NITRITE NEGATIVE 01/21/2021 2046   LEUKOCYTESUR NEGATIVE 01/21/2021 2046   Sepsis Labs Invalid input(s): PROCALCITONIN,  WBC,  LACTICIDVEN Microbiology Recent Results (from the past 240 hour(s))  Urine Culture     Status: None   Collection Time: 01/21/21  1:37 AM   Specimen: Urine, Catheterized  Result Value Ref Range Status   Specimen Description URINE, CATHETERIZED  Final   Special Requests NONE  Final   Culture   Final    NO GROWTH Performed at [facility], [address] [place], [place] [address]    Report Status 01/23/2021 FINAL  Final  Resp Panel by RT-PCR (Flu A&B, Covid) Nasopharyngeal Swab     Status: None   Collection Time: 01/22/21  1:10 AM   Specimen: Nasopharyngeal Swab; Nasopharyngeal(NP) swabs  in vial transport medium  Result Value Ref Range Status   SARS Coronavirus 2 by RT PCR NEGATIVE NEGATIVE Final    Comment: (NOTE) SARS-CoV-2 target nucleic acids are NOT DETECTED.  The SARS-CoV-2 RNA is generally detectable in upper respiratory specimens during the acute phase of infection. The lowest concentration of SARS-CoV-2 viral copies this assay can detect is 138 copies/mL. A negative result does not preclude  SARS-Cov-2 infection and should not be used as the sole basis for treatment or other patient management decisions. A negative result may occur with  improper specimen collection/handling, submission of specimen other than nasopharyngeal swab, presence of viral mutation(s) within the areas targeted by this assay, and inadequate number of viral copies(<138 copies/mL). A negative result must be combined with clinical observations, patient history, and epidemiological information. The expected result is Negative.  Fact Sheet for Patients:  EntrepreneurPulse.com.au  Fact Sheet for Healthcare Providers:  IncredibleEmployment.be  This test is no t yet approved or cleared by the [place] FDA and  has been authorized for detection and/or diagnosis of SARS-CoV-2 by FDA under an Emergency Use Authorization (EUA). This EUA will remain  in effect (meaning this test can be used) for the duration of the COVID-19 declaration under Section 564(b)(1) of the Act, 21 U.S.C.section 360bbb-3(b)(1), unless the authorization is terminated  or revoked sooner.       Influenza A by PCR NEGATIVE NEGATIVE Final   Influenza B by PCR NEGATIVE NEGATIVE Final    Comment: (NOTE) The Xpert Xpress SARS-CoV-2/FLU/RSV plus assay is intended as an aid in the diagnosis of influenza from Nasopharyngeal swab specimens and should not be used as a sole basis for treatment. Nasal washings and aspirates are unacceptable for Xpert Xpress SARS-CoV-2/FLU/RSV testing.  Fact Sheet for Patients: EntrepreneurPulse.com.au  Fact Sheet for Healthcare Providers: IncredibleEmployment.be  This test is not yet approved or cleared by the [place] FDA and has been authorized for detection and/or diagnosis of SARS-CoV-2 by FDA under an Emergency Use Authorization (EUA). This EUA will remain in effect (meaning this test can be used) for the duration of  the COVID-19 declaration under Section 564(b)(1) of the Act, 21 U.S.C. section 360bbb-3(b)(1), unless the authorization is terminated or revoked.  Performed at [facility], [place] [address] [place], [place] [address]   Culture, blood (routine x 2)     Status: None (Preliminary result)   Collection Time: 01/22/21  3:25 AM   Specimen: BLOOD RIGHT HAND  Result Value Ref Range Status   Specimen Description BLOOD RIGHT HAND  Final   Special Requests   Final    BOTTLES DRAWN AEROBIC AND ANAEROBIC Blood Culture adequate volume   Culture   Final    NO GROWTH 4 DAYS Performed at [facility], [place] [address] [place], [place] [address]    Report Status PENDING  Incomplete  Culture, blood (routine x 2)     Status: None (Preliminary result)   Collection Time: 01/22/21  4:15 AM   Specimen: BLOOD LEFT FOREARM  Result Value Ref Range Status   Specimen Description BLOOD LEFT FOREARM  Final   Special Requests   Final    BOTTLES DRAWN AEROBIC AND ANAEROBIC Blood Culture adequate volume   Culture   Final    NO GROWTH 4 DAYS Performed at [facility], [place] [address] [place], [place] [address]    Report Status PENDING  Incomplete  MRSA Next Gen by PCR, Nasal     Status:  None   Collection Time: 01/22/21  5:20 AM   Specimen: Nasal Mucosa; Nasal Swab  Result Value Ref Range Status   MRSA by PCR Next Gen NOT DETECTED NOT DETECTED Final    Comment: (NOTE) The GeneXpert MRSA Assay (FDA approved for NASAL specimens only), is one component of a comprehensive MRSA colonization surveillance program. It is not intended to diagnose MRSA infection nor to guide or monitor treatment for MRSA infections. Test performance is not FDA approved in patients less than 58 years old. Performed at [facility], [place] [address], [place], [place] [address]   C Difficile Quick Screen w PCR reflex     Status: None   Collection Time: 01/24/21  5:51 AM   Specimen: STOOL  Result Value Ref Range  Status   C Diff antigen NEGATIVE NEGATIVE Final   C Diff toxin NEGATIVE NEGATIVE Final   C Diff interpretation No C. difficile detected.  Final    Comment: Performed at [facility], [place] [address], [place], [place] [address]  Gastrointestinal Panel by PCR , Stool     Status: None   Collection Time: 01/24/21  5:51 AM   Specimen: Stool  Result Value Ref Range Status   Campylobacter species NOT DETECTED NOT DETECTED Final   Plesimonas shigelloides NOT DETECTED NOT DETECTED Final   Salmonella species NOT DETECTED NOT DETECTED Final   Yersinia enterocolitica NOT DETECTED NOT DETECTED Final   Vibrio species NOT DETECTED NOT DETECTED Final   Vibrio cholerae NOT DETECTED NOT DETECTED Final   Enteroaggregative E coli (EAEC) NOT DETECTED NOT DETECTED Final   Enteropathogenic E coli (EPEC) NOT DETECTED NOT DETECTED Final   Enterotoxigenic E coli (ETEC) NOT DETECTED NOT DETECTED Final   Shiga like toxin producing E coli (STEC) NOT DETECTED NOT DETECTED Final   Shigella/Enteroinvasive E coli (EIEC) NOT DETECTED NOT DETECTED Final   Cryptosporidium NOT DETECTED NOT DETECTED Final   Cyclospora cayetanensis NOT DETECTED NOT DETECTED Final   Entamoeba histolytica NOT DETECTED NOT DETECTED Final   Giardia lamblia NOT DETECTED NOT DETECTED Final   Adenovirus F40/41 NOT DETECTED NOT DETECTED Final   Astrovirus NOT DETECTED NOT DETECTED Final   Norovirus GI/GII NOT DETECTED NOT DETECTED Final   Rotavirus A NOT DETECTED NOT DETECTED Final   Sapovirus (I, II, IV, and V) NOT DETECTED NOT DETECTED Final    Comment: Performed at [facility], [address] [place], [place] [address]     Time coordinating discharge: 35 minutes  SIGNED:   Cordelia Poche, MD [facility] 01/27/2021, 12:33 PM

## 2021-01-28 DIAGNOSIS — S31819A Unspecified open wound of right buttock, initial encounter: Secondary | ICD-10-CM | POA: Diagnosis not present

## 2021-01-28 DIAGNOSIS — G9341 Metabolic encephalopathy: Secondary | ICD-10-CM | POA: Diagnosis not present

## 2021-01-28 DIAGNOSIS — J309 Allergic rhinitis, unspecified: Secondary | ICD-10-CM | POA: Diagnosis not present

## 2021-01-28 DIAGNOSIS — L308 Other specified dermatitis: Secondary | ICD-10-CM | POA: Diagnosis not present

## 2021-03-09 NOTE — Progress Notes (Signed)
Cardiology Office Note   Date:  03/10/2021   ID:  [name] January 06, 1941, MRN [id number]  PCP:  Briana Pillar, MD    No chief complaint on file.  DOE  [facility] Readings from Last 3 Encounters:  03/10/21 157 lb (71.2 kg)  01/23/21 155 lb 10.3 oz (70.6 kg)  01/06/21 156 lb (70.8 kg)       History of Present Illness: [name] is a 80 y.o. female  with HTN and HLD.   She has had fibromyalgia.   She was unable to tolerate HCTZ due to hyponatremia in the past. She switched her ACE-I to amlodipine and her sodium improved.   Admitted with delirium in 2022.  Had elevated troponin, flat pattern.  Echo was done.   2022 echo showed: "Murmur secondary to hyperdynamic LV contraction with turbulence  through outflow tract. There is no significant outflow tract gradient,  however.   2. Left ventricular ejection fraction, by estimation, is >75%. The left  ventricle has hyperdynamic function. The left ventricle has no regional  wall motion abnormalities. Left ventricular diastolic parameters are  consistent with Grade I diastolic  dysfunction (impaired relaxation).   3. Right ventricular systolic function is normal. The right ventricular  size is normal. There is normal pulmonary artery systolic pressure. The  estimated right ventricular systolic pressure is 0000000 mmHg.   4. Left atrial size was mildly dilated.   5. The mitral valve is normal in structure. Trivial mitral valve  regurgitation. No evidence of mitral stenosis.   6. The aortic valve is normal in structure. Aortic valve regurgitation is  not visualized. No aortic stenosis is present.   7. The inferior vena cava is normal in size with greater than 50%  respiratory variability, suggesting right atrial pressure of 3 mmHg. "  Denies : Chest pain. Dizziness. Leg edema. Nitroglycerin use. Orthopnea. Palpitations. Paroxysmal nocturnal dyspnea. Shortness of breath. Syncope.    Doing well with her exercise program or  fibromyagia.  No cardiac sx.    Feeling back to normal since her hospital stay.  Past Medical History:  Diagnosis Date   BCC (basal cell carcinoma of skin) 03/27/2006   Above Left Upper Lip (MOH's)   Benign essential tremor    Chronic kidney disease    Essential hypertension, benign    Fibromyalgia    GERD (gastroesophageal reflux disease)    Heart murmur    Hemorrhoids    Hyponatremia    secondary to hctz-recurrent March 2010   Low back pain    facet joint pain   Occipital neuralgia    Osteoarthritis    Osteopenia    Pure hypercholesterolemia 12/16/2013   SCCA (squamous cell carcinoma) of skin 07/15/2002   Left Inner Cheek(in situ)   SCCA (squamous cell carcinoma) of skin 05/06/2004   Right Wing Nose(in situ) -Cx3,5FU   SCCA (squamous cell carcinoma) of skin 01/09/2011   Above Right Upper Lip(in situ)-Cx3   SCCA (squamous cell carcinoma) of skin 01/05/2014   Left [place](in situ)-Cx3,5FU   SCCA (squamous cell carcinoma) of skin 09/30/2014   Right Wrist(in situ)-tx p bx   Shortness of breath dyspnea    Superficial basal cell carcinoma (BCC) 08/17/1982   Right Side Upper Lip   Tuberculosis    as a child    Past Surgical History:  Procedure Laterality Date   TOTAL KNEE ARTHROPLASTY Left 11/08/2015   Procedure: LEFT TOTAL KNEE ARTHROPLASTY;  Surgeon: Gaynelle Arabian, MD;  Location: [facility];  Service: Orthopedics;  Laterality: Left;   TUBAL LIGATION       Current Outpatient Medications  Medication Sig Dispense Refill   amLODipine (NORVASC) 5 MG tablet TAKE ONE TABLET BY MOUTH DAILY 90 tablet 2   atenolol (TENORMIN) 25 MG tablet TAKE ONE TABLET BY MOUTH DAILY 90 tablet 1   pregabalin (LYRICA) 75 MG capsule Take 75 mg by mouth daily.     primidone (MYSOLINE) 50 MG tablet 0.5 tab at night time, increase to bid after 30 days. 45 tablet 5   simvastatin (ZOCOR) 40 MG tablet Take 1 tablet (40 mg total) by mouth at bedtime. 90 tablet 2   traMADol (ULTRAM) 50 MG tablet Take 2  tablets (100 mg total) by mouth every 12 (twelve) hours as needed. 10 tablet 0   traZODone (DESYREL) 50 MG tablet Take 50 mg by mouth at bedtime.     diclofenac Sodium (VOLTAREN) 1 % GEL Apply 4 g topically 4 (four) times daily. (Patient not taking: Reported on 03/10/2021) 100 g 0   vitamin B-12 (CYANOCOBALAMIN) 500 MCG tablet Take 1 tablet (500 mcg total) by mouth daily. (Patient not taking: Reported on 03/10/2021)     No current facility-administered medications for this visit.    Allergies:   Hctz [hydrochlorothiazide], Lisinopril, and Sulfa antibiotics    Social History:  The patient  reports that she has never smoked. She has never used smokeless tobacco. She reports current alcohol use. She reports that she does not use drugs.   Family History:  The patient's family history includes Cancer - Ovarian in her mother; Congestive Heart Failure in her mother; Heart disease in her father and mother; Prostate cancer in her father; Stroke in her sister.    ROS:  Please see the history of present illness.   Otherwise, review of systems are positive for recent episode of delirium, resolved.   All other systems are reviewed and negative.    PHYSICAL EXAM: VS:  BP 126/66   Pulse 73   Ht '5\' 4"'$  (1.626 m)   Wt 157 lb (71.2 kg)   SpO2 96%   BMI 26.95 kg/m  , BMI Body mass index is 26.95 kg/m. GEN: Well nourished, well developed, in no acute distress HEENT: normal Neck: no JVD, carotid bruits, or masses Cardiac: RRR; no murmurs, rubs, or gallops,no edema  Respiratory:  clear to auscultation bilaterally, normal work of breathing GI: soft, nontender, nondistended, + BS MS: no deformity or atrophy Skin: warm and dry, no rash Neuro:  Strength and sensation are intact Psych: euthymic mood, full affect   EKG:   The ekg ordered 01/21/21 demonstrates sinus tachycardia with PACs   Recent Labs: 01/22/2021: TSH 0.793 01/26/2021: ALT 15; BUN <5; Creatinine, Ser 0.75; Hemoglobin 12.5; Magnesium 2.0;  Platelets 135; Potassium 4.3; Sodium 131   Lipid Panel No results found for: CHOL, TRIG, HDL, CHOLHDL, VLDL, LDLCALC, LDLDIRECT   Other studies Reviewed: Additional studies/ records that were reviewed today with results demonstrating: labs reviewed-Hospital records reviewed.   ASSESSMENT AND PLAN:  DOE: Improved.  Continue regular exercise classes.  If she has any change in her exercise tolerance, she will let us know. HTN: The current medical regimen is effective;  continue present plan and medications.  Would prefer beta blocker to vasodilators if she had higher BP readings, given LVH pattern- septal hypertrophy.  Avoid dehydration. Hyperlipidemia: LDL 64.  Continue simvastatin along whole food, plant-based diet.   Current medicines are reviewed at length with the patient today.  The patient concerns regarding her medicines were addressed.  The following changes have been made:  No change  Labs/ tests ordered today include:  No orders of the defined types were placed in this encounter.   Recommend 150 minutes/week of aerobic exercise Low fat, low carb, high fiber diet recommended  Disposition:   FU in 1 year   Signed, Larae Grooms, MD  03/10/2021 3:26 PM    [facility] [place], [place], [place]  [address] Phone: [phone]; Fax: [phone]

## 2021-03-10 ENCOUNTER — Ambulatory Visit: Payer: PPO | Admitting: Interventional Cardiology

## 2021-03-10 ENCOUNTER — Other Ambulatory Visit: Payer: Self-pay

## 2021-03-10 ENCOUNTER — Encounter: Payer: Self-pay | Admitting: Interventional Cardiology

## 2021-03-10 VITALS — BP 126/66 | HR 73 | Ht 64.0 in | Wt 157.0 lb

## 2021-03-10 DIAGNOSIS — I517 Cardiomegaly: Secondary | ICD-10-CM | POA: Diagnosis not present

## 2021-03-10 DIAGNOSIS — I10 Essential (primary) hypertension: Secondary | ICD-10-CM | POA: Diagnosis not present

## 2021-03-10 DIAGNOSIS — E782 Mixed hyperlipidemia: Secondary | ICD-10-CM

## 2021-03-10 DIAGNOSIS — R0600 Dyspnea, unspecified: Secondary | ICD-10-CM

## 2021-03-10 DIAGNOSIS — R0609 Other forms of dyspnea: Secondary | ICD-10-CM

## 2021-03-10 NOTE — Patient Instructions (Signed)

## 2021-03-17 DIAGNOSIS — F4489 Other dissociative and conversion disorders: Secondary | ICD-10-CM | POA: Diagnosis not present

## 2021-03-17 DIAGNOSIS — M797 Fibromyalgia: Secondary | ICD-10-CM | POA: Diagnosis not present

## 2021-03-17 DIAGNOSIS — I129 Hypertensive chronic kidney disease with stage 1 through stage 4 chronic kidney disease, or unspecified chronic kidney disease: Secondary | ICD-10-CM | POA: Diagnosis not present

## 2021-03-17 DIAGNOSIS — E7800 Pure hypercholesterolemia, unspecified: Secondary | ICD-10-CM | POA: Diagnosis not present

## 2021-03-17 DIAGNOSIS — E559 Vitamin D deficiency, unspecified: Secondary | ICD-10-CM | POA: Diagnosis not present

## 2021-03-17 DIAGNOSIS — N183 Chronic kidney disease, stage 3 unspecified: Secondary | ICD-10-CM | POA: Diagnosis not present

## 2021-04-04 ENCOUNTER — Other Ambulatory Visit: Payer: Self-pay | Admitting: Neurology

## 2021-04-06 ENCOUNTER — Other Ambulatory Visit: Payer: Self-pay | Admitting: Neurology

## 2021-04-18 ENCOUNTER — Encounter: Payer: Self-pay | Admitting: Psychology

## 2021-04-25 ENCOUNTER — Other Ambulatory Visit: Payer: Self-pay | Admitting: Neurology

## 2021-04-25 NOTE — Telephone Encounter (Signed)
It is unclear which dosage of primidone the patient is taking.  I called patient to discuss. No answer, left a message asking her to call me back.  Please clarify for patient if she is taking primidone 50mg  1/2 tablet BID or 1 tablet BID.

## 2021-04-26 NOTE — Telephone Encounter (Signed)
Called pt. She is taking primidone 50mg , 1/2 tab BID.

## 2021-05-10 NOTE — Telephone Encounter (Signed)
Received an email 04/28/2021 from [facility] that the pt was never set up  "we have made several attempts to get patient scheduled for PAP setup without success. Voiding order."

## 2021-05-20 DIAGNOSIS — E871 Hypo-osmolality and hyponatremia: Secondary | ICD-10-CM | POA: Diagnosis not present

## 2021-06-04 DIAGNOSIS — W5501XA Bitten by cat, initial encounter: Secondary | ICD-10-CM | POA: Diagnosis not present

## 2021-06-04 DIAGNOSIS — S61431A Puncture wound without foreign body of right hand, initial encounter: Secondary | ICD-10-CM | POA: Diagnosis not present

## 2021-06-20 ENCOUNTER — Ambulatory Visit: Payer: PPO | Admitting: Dermatology

## 2021-06-20 ENCOUNTER — Encounter: Payer: Self-pay | Admitting: Dermatology

## 2021-06-20 ENCOUNTER — Other Ambulatory Visit: Payer: Self-pay

## 2021-06-20 DIAGNOSIS — L570 Actinic keratosis: Secondary | ICD-10-CM

## 2021-06-20 DIAGNOSIS — L821 Other seborrheic keratosis: Secondary | ICD-10-CM

## 2021-06-20 DIAGNOSIS — L820 Inflamed seborrheic keratosis: Secondary | ICD-10-CM | POA: Diagnosis not present

## 2021-06-20 DIAGNOSIS — Z1283 Encounter for screening for malignant neoplasm of skin: Secondary | ICD-10-CM

## 2021-06-20 DIAGNOSIS — Z85828 Personal history of other malignant neoplasm of skin: Secondary | ICD-10-CM

## 2021-06-25 DIAGNOSIS — R590 Localized enlarged lymph nodes: Secondary | ICD-10-CM | POA: Diagnosis not present

## 2021-06-25 DIAGNOSIS — L309 Dermatitis, unspecified: Secondary | ICD-10-CM | POA: Diagnosis not present

## 2021-06-25 DIAGNOSIS — S61219A Laceration without foreign body of unspecified finger without damage to nail, initial encounter: Secondary | ICD-10-CM | POA: Diagnosis not present

## 2021-06-27 ENCOUNTER — Ambulatory Visit: Payer: PPO | Admitting: Dermatology

## 2021-06-29 DIAGNOSIS — L989 Disorder of the skin and subcutaneous tissue, unspecified: Secondary | ICD-10-CM | POA: Diagnosis not present

## 2021-06-29 DIAGNOSIS — R591 Generalized enlarged lymph nodes: Secondary | ICD-10-CM | POA: Diagnosis not present

## 2021-06-30 ENCOUNTER — Other Ambulatory Visit: Payer: Self-pay

## 2021-06-30 ENCOUNTER — Encounter: Payer: Self-pay | Admitting: Dermatology

## 2021-06-30 ENCOUNTER — Ambulatory Visit: Payer: PPO | Admitting: Dermatology

## 2021-06-30 DIAGNOSIS — L304 Erythema intertrigo: Secondary | ICD-10-CM | POA: Diagnosis not present

## 2021-06-30 DIAGNOSIS — L089 Local infection of the skin and subcutaneous tissue, unspecified: Secondary | ICD-10-CM

## 2021-06-30 DIAGNOSIS — L080 Pyoderma: Secondary | ICD-10-CM

## 2021-06-30 MED ORDER — CEPHALEXIN 500 MG PO CAPS: 500.0000 mg | ORAL_CAPSULE | Freq: Three times a day (TID) | ORAL | 0 refills | Status: AC

## 2021-07-05 ENCOUNTER — Telehealth: Payer: Self-pay

## 2021-07-05 NOTE — Telephone Encounter (Signed)
-----   Message from Lavonna Monarch, MD sent at 07/03/2021  1:02 PM EST ----- Culture shows a non-MRSA staph; already on cephalexin so we will check her status in 3 to 4 days.

## 2021-07-05 NOTE — Telephone Encounter (Signed)
Lab given to patient and she stated she is improving on the antibiotic & will call if she has any issues

## 2021-07-06 ENCOUNTER — Encounter: Payer: Self-pay | Admitting: Dermatology

## 2021-07-06 LAB — ANAEROBIC AND AEROBIC CULTURE
MICRO NUMBER:: 12790761
MICRO NUMBER:: 12790762
SPECIMEN QUALITY:: ADEQUATE
SPECIMEN QUALITY:: ADEQUATE

## 2021-07-06 NOTE — Progress Notes (Signed)
° °  Follow-Up Visit   Subjective  [name] is a 80 y.o. female who presents for the following: Annual Exam (Lesions on back, face and scalp x years. If she picks them there is bleeding.  Patient has history of bcc and scc.).  General skin examination, multiple crusts Location:  Duration:  Quality:  Associated Signs/Symptoms: Modifying Factors:  Severity:  Timing: Context:   Objective  Well appearing patient in no apparent distress; mood and affect are within normal limits. Upper Body Skin Exam: No atypical pigmented lesions, no new or recurrent nonmelanoma skin cancers.  Multiple 5 mm flattopped textured brown papules  No sign recurrence  Left Breast, Mid Upper [place] Lip, Right Breast (2) Crusty and hornlike 5 mm pink lesions    All skin waist up examined.  Areas beneath undergarments not fully examined.   Assessment & Plan    Screening exam for skin cancer  Annual skin examination.  Seborrheic keratosis  Benign, ok to leave unless pt wants removed or clinically changes  Hx of nonmelanoma skin cancer  Check.  As needed change  Actinic keratosis (4) Left Breast; Right Breast (2); Mid Upper [place] Lip  Destruction of lesion - Left Breast, Mid Upper [place] Lip, Right Breast Complexity: simple   Destruction method: cryotherapy   Informed consent: discussed and consent obtained   Lesion destroyed using liquid nitrogen: Yes   Cryotherapy cycles:  3 Outcome: patient tolerated procedure well with no complications    Inflamed seborrheic keratosis Left Lower Back (2); Right Lower Back (3)      I, Lavonna Monarch, MD, have reviewed all documentation for this visit.  The documentation on 07/06/21 for the exam, diagnosis, procedures, and orders are all accurate and complete.

## 2021-07-14 ENCOUNTER — Ambulatory Visit: Payer: PPO | Admitting: Physician Assistant

## 2021-07-14 ENCOUNTER — Other Ambulatory Visit: Payer: Self-pay

## 2021-07-14 ENCOUNTER — Encounter: Payer: Self-pay | Admitting: Physician Assistant

## 2021-07-14 DIAGNOSIS — L570 Actinic keratosis: Secondary | ICD-10-CM

## 2021-07-14 DIAGNOSIS — L080 Pyoderma: Secondary | ICD-10-CM

## 2021-07-14 DIAGNOSIS — B351 Tinea unguium: Secondary | ICD-10-CM | POA: Diagnosis not present

## 2021-07-14 DIAGNOSIS — L820 Inflamed seborrheic keratosis: Secondary | ICD-10-CM

## 2021-07-14 DIAGNOSIS — B359 Dermatophytosis, unspecified: Secondary | ICD-10-CM

## 2021-07-14 DIAGNOSIS — D485 Neoplasm of uncertain behavior of skin: Secondary | ICD-10-CM

## 2021-07-14 MED ORDER — CEPHALEXIN 500 MG PO CAPS
500.0000 mg | ORAL_CAPSULE | Freq: Two times a day (BID) | ORAL | 0 refills | Status: AC
Start: 2021-07-14 — End: 2021-07-24

## 2021-07-14 NOTE — Patient Instructions (Addendum)
Seborrheic Keratosis A seborrheic keratosis is a common, noncancerous (benign) skin growth. These growths are velvety, waxy, rough, tan, brown, or black spots that appear on the skin. These skin growths can be flat or raised, and scaly. What are the causes? The cause of this condition is not known. What increases the risk? You are more likely to develop this condition if you: Have a family history of seborrheic keratosis. Are 50 or older. Are pregnant. Have had estrogen replacement therapy. What are the signs or symptoms? Symptoms of this condition include growths on the face, chest, shoulders, back, or other areas. These growths: Are usually painless, but may become irritated and itchy. Can be yellow, brown, black, or other colors. Are slightly raised or have a flat surface. Are sometimes rough or wart-like in texture. Are often velvety or waxy on the surface. Are round or oval-shaped. Often occur in groups, but may occur as a single growth. How is this diagnosed? This condition is diagnosed with a medical history and physical exam. A sample of the growth may be tested (skin biopsy). You may need to see a skin specialist (dermatologist). How is this treated? Treatment is not usually needed for this condition, unless the growths are irritated or bleed often. You may also choose to have the growths removed if you do not like their appearance. Most commonly, these growths are treated with a procedure in which liquid nitrogen is applied to "freeze" off the growth (cryosurgery). They may also be burned off with electricity (electrocautery) or removed by scraping (curettage). Follow these instructions at home: Watch your growth for any changes. Keep all follow-up visits as told by your health care provider. This is important. Do not scratch or pick at the growth or growths. This can cause them to become irritated or infected. Contact a health care provider if: You suddenly have many new  growths. Your growth bleeds, itches, or hurts. Your growth suddenly becomes larger or changes color. Summary A seborrheic keratosis is a common, noncancerous (benign) skin growth. Treatment is not usually needed for this condition, unless the growths are irritated or bleed often. Watch your growth for any changes. Contact a health care provider if you suddenly have many new growths or your growth suddenly becomes larger or changes color. Keep all follow-up visits as told by your health care provider. This is important. This information is not intended to replace advice given to you by your health care provider. Make sure you discuss any questions you have with your health care provider. Document Revised: 11/08/2017 Document Reviewed: 11/08/2017 Elsevier Patient Education  [place]. Biopsy, Surgery (Curettage) & Surgery (Excision) Aftercare Instructions  1. Okay to remove bandage in 24 hours  2. Wash area with soap and water  3. Apply Vaseline to area twice daily until healed (Not Neosporin)  4. Okay to cover with a Band-Aid to decrease the chance of infection or prevent irritation from clothing; also it's okay to uncover lesion at home.  5. Suture instructions: return to our office in 7-10 or 10-14 days for a nurse visit for suture removal. Variable healing with sutures, if pain or itching occurs call our office. It's okay to shower or bathe 24 hours after sutures are given.  6. The following risks may occur after a biopsy, curettage or excision: bleeding, scarring, discoloration, recurrence, infection (redness, yellow drainage, pain or swelling).  7. For questions, concerns and results call our office at [place] before 4pm & Friday before 3pm. Biopsy results will be  available in 1 week.

## 2021-07-18 ENCOUNTER — Ambulatory Visit: Payer: PPO | Admitting: Dermatology

## 2021-07-18 ENCOUNTER — Encounter: Payer: Self-pay | Admitting: Physician Assistant

## 2021-07-18 NOTE — Progress Notes (Signed)
° °  Follow-Up Visit   Subjective  [name] is a 81 y.o. female who presents for the following: Skin Problem (Scalp issues- Saw dr tafeen 06/30/21- bacteria culture positive- patient just finished cephalexin tid- last dose was 07/11/20- "infections still spreading" ).   The following portions of the chart were reviewed this encounter and updated as appropriate:  Tobacco   Allergies   Meds   Problems   Med Hx   Surg Hx   Fam Hx       Objective  Well appearing patient in no apparent distress; mood and affect are within normal limits.  All skin waist up and feet.   Mid Parietal Scalp Excoriations with open wounds.  Right Parietal Scalp Thick crust, bleeding and excoriated.   Neck - Anterior, Right Parietal Scalp Erythematous patches with gritty scale.  Right Hallux Proximal Nail Fold of Toe Thick, yellow nail.   Assessment & Plan  Pyoderma Mid Parietal Scalp  cephALEXin (KEFLEX) 500 MG capsule - Mid Parietal Scalp Take 1 capsule (500 mg total) by mouth 2 (two) times daily for 20 doses.  Neoplasm of uncertain behavior of skin Right Parietal Scalp  Skin / nail biopsy Type of biopsy: tangential   Informed consent: discussed and consent obtained   Timeout: patient name, date of birth, surgical site, and procedure verified   Procedure prep:  Patient was prepped and draped in usual sterile fashion (Non sterile) Prep type:  Chlorhexidine Anesthesia: the lesion was anesthetized in a standard fashion   Anesthetic:  1% lidocaine w/ epinephrine 1-100,000 local infiltration Instrument used: flexible razor blade   Outcome: patient tolerated procedure well   Post-procedure details: wound care instructions given   Additional details:  Cautery only  Specimen 1 - Surgical pathology Differential Diagnosis: r/o sk  Check Margins: No  AK (actinic keratosis) (2) Neck - Anterior; Right Parietal Scalp  Destruction of lesion - Neck - Anterior, Right Parietal Scalp Complexity: simple    Destruction method: cryotherapy   Informed consent: discussed and consent obtained   Timeout:  patient name, date of birth, surgical site, and procedure verified Lesion destroyed using liquid nitrogen: Yes   Cryotherapy cycles:  3 Outcome: patient tolerated procedure well with no complications    Onychomycosis Right Hallux Proximal Nail Fold of Toe  Over the counter anti-fungal. No cure- just to decrease the risk of it spreading to her skin.     I, Layann Bluett, PA-C, have reviewed all documentation's for this visit.  The documentation on 07/18/21 for the exam, diagnosis, procedures and orders are all accurate and complete.

## 2021-07-19 ENCOUNTER — Other Ambulatory Visit: Payer: Self-pay | Admitting: Neurology

## 2021-07-30 ENCOUNTER — Encounter: Payer: Self-pay | Admitting: Dermatology

## 2021-07-30 NOTE — Progress Notes (Signed)
° °  Follow-Up Visit   Subjective  [name] is a 81 y.o. female who presents for the following: Skin Problem (Scalp infection per patient right scalp).  Sore area right scalp Location:  Duration:  Quality:  Associated Signs/Symptoms: Modifying Factors:  Severity:  Timing: Context:   Objective  Well appearing patient in no apparent distress; mood and affect are within normal limits. Right Parietal Scalp Angry folliculitis with 1 cm slightly tender pink annular    A focused examination was performed including head and neck. Relevant physical exam findings are noted in the Assessment and Plan.   Assessment & Plan    Local infection of skin and subcutaneous tissue  Related Procedures Anaerobic and Aerobic Culture  Related Medications cephALEXin (KEFLEX) 500 MG capsule Take 1 capsule (500 mg total) by mouth 3 (three) times daily for 10 days.  Pyoderma Right Parietal Scalp  Bacterial culture, oral cephalexin pending this result.  Anaerobic and Aerobic Culture - Right Parietal Scalp  cephALEXin (KEFLEX) 500 MG capsule - Right Parietal Scalp Take 1 capsule (500 mg total) by mouth 3 (three) times daily for 10 days.      I, Lavonna Monarch, MD, have reviewed all documentation for this visit.  The documentation on 07/30/21 for the exam, diagnosis, procedures, and orders are all accurate and complete.

## 2021-08-12 DIAGNOSIS — H52203 Unspecified astigmatism, bilateral: Secondary | ICD-10-CM | POA: Diagnosis not present

## 2021-08-12 DIAGNOSIS — H2511 Age-related nuclear cataract, right eye: Secondary | ICD-10-CM | POA: Diagnosis not present

## 2021-08-12 DIAGNOSIS — H02051 Trichiasis without entropian right upper eyelid: Secondary | ICD-10-CM | POA: Diagnosis not present

## 2021-08-12 DIAGNOSIS — H02054 Trichiasis without entropian left upper eyelid: Secondary | ICD-10-CM | POA: Diagnosis not present

## 2021-08-20 ENCOUNTER — Other Ambulatory Visit: Payer: Self-pay | Admitting: Neurology

## 2021-08-25 ENCOUNTER — Telehealth: Payer: Self-pay | Admitting: Neurology

## 2021-08-25 MED ORDER — PRIMIDONE 50 MG PO TABS: 50.0000 mg | ORAL_TABLET | Freq: Two times a day (BID) | ORAL | 4 refills | Status: DC

## 2021-08-25 NOTE — Telephone Encounter (Signed)
Called and LVM stating I have sent in refill as requested

## 2021-08-25 NOTE — Telephone Encounter (Signed)
Pt has scheduled her 4 mo f/u she is on wait list , pt asking if now she can get a refill on her primidone (MYSOLINE) 50 MG tablet to [place]

## 2021-09-04 ENCOUNTER — Other Ambulatory Visit: Payer: Self-pay | Admitting: Neurology

## 2021-09-15 DIAGNOSIS — M8588 Other specified disorders of bone density and structure, other site: Secondary | ICD-10-CM | POA: Diagnosis not present

## 2021-09-15 DIAGNOSIS — I700 Atherosclerosis of aorta: Secondary | ICD-10-CM | POA: Diagnosis not present

## 2021-09-15 DIAGNOSIS — M797 Fibromyalgia: Secondary | ICD-10-CM | POA: Diagnosis not present

## 2021-09-15 DIAGNOSIS — Z0000 Encounter for general adult medical examination without abnormal findings: Secondary | ICD-10-CM | POA: Diagnosis not present

## 2021-09-15 DIAGNOSIS — I129 Hypertensive chronic kidney disease with stage 1 through stage 4 chronic kidney disease, or unspecified chronic kidney disease: Secondary | ICD-10-CM | POA: Diagnosis not present

## 2021-09-15 DIAGNOSIS — Z1389 Encounter for screening for other disorder: Secondary | ICD-10-CM | POA: Diagnosis not present

## 2021-09-15 DIAGNOSIS — N183 Chronic kidney disease, stage 3 unspecified: Secondary | ICD-10-CM | POA: Diagnosis not present

## 2021-09-15 DIAGNOSIS — G4733 Obstructive sleep apnea (adult) (pediatric): Secondary | ICD-10-CM | POA: Diagnosis not present

## 2021-09-15 DIAGNOSIS — M1990 Unspecified osteoarthritis, unspecified site: Secondary | ICD-10-CM | POA: Diagnosis not present

## 2021-09-15 DIAGNOSIS — G250 Essential tremor: Secondary | ICD-10-CM | POA: Diagnosis not present

## 2021-09-15 DIAGNOSIS — Z01818 Encounter for other preprocedural examination: Secondary | ICD-10-CM | POA: Diagnosis not present

## 2021-10-05 ENCOUNTER — Encounter: Payer: PPO | Admitting: Psychology

## 2021-10-12 ENCOUNTER — Encounter: Payer: PPO | Admitting: Psychology

## 2021-11-02 ENCOUNTER — Encounter: Payer: Self-pay | Admitting: Physician Assistant

## 2021-11-02 ENCOUNTER — Ambulatory Visit: Payer: PPO | Admitting: Physician Assistant

## 2021-11-02 DIAGNOSIS — B351 Tinea unguium: Secondary | ICD-10-CM

## 2021-11-02 DIAGNOSIS — L570 Actinic keratosis: Secondary | ICD-10-CM

## 2021-11-02 DIAGNOSIS — L080 Pyoderma: Secondary | ICD-10-CM

## 2021-11-02 MED ORDER — CEPHALEXIN 500 MG PO CAPS: 500.0000 mg | ORAL_CAPSULE | Freq: Two times a day (BID) | ORAL | 0 refills | Status: AC

## 2021-11-02 MED ORDER — TRIAMCINOLONE ACETONIDE 10 MG/ML IJ SUSP
5.0000 mg | Freq: Once | INTRAMUSCULAR | Status: AC
  Administered 2021-11-02: 5 mg

## 2021-11-02 NOTE — Progress Notes (Signed)
? ?  Follow-Up Visit ?  ?Subjective  ?[name] is a 81 y.o. female who presents for the following: Skin Problem (Patient here today for return of pyoderma on her scalp, patient has used Keflex in the past for this which did help. Patient also states that she has a few lesion on her face that she would like checked x years no bleeding, no pain, just crusty. ). ? ? ?The following portions of the chart were reviewed this encounter and updated as appropriate:  Tobacco  Allergies  Meds  Problems  Med Hx  Surg Hx  Fam Hx   ?  ? ?Objective  ?Well appearing patient in no apparent distress; mood and affect are within normal limits. ? ?All skin waist up examined. ? ?Scalp ?Large patch of edema on an erythematous base.  ? ?Left Buccal Krause, Left Temple, Mid Upper Vermilion Lip, Right Buccal Krause ?Erythematous patches with gritty scale. ? ?Right Hallux Toe Nail Plate ?Thick yellow toenails ? ? ?Assessment & Plan  ?Pyoderma ?Scalp ? ?cephALEXin (KEFLEX) 500 MG capsule - Scalp ?Take 1 capsule (500 mg total) by mouth 2 (two) times daily for 10 days. ? ?Intralesional injection - Scalp ?Location: right scalp ? ?Informed Consent: Discussed risks (infection, pain, bleeding, bruising, thinning of the skin, loss of skin pigment, lack of resolution, and recurrence of lesion) and benefits of the procedure, as well as the alternatives. Informed consent was obtained. ?Preparation: The area was prepared a standard fashion ? ?Procedure Details: An intralesional injection was performed with Kenalog 5 mg/cc. 1 cc in total were injected. ? ?Total number of injections: 4 ? ?Plan: The patient was instructed on post-op care. Recommend OTC analgesia as needed for pain. ? ? ?Related Medications ?triamcinolone acetonide (KENALOG) 10 MG/ML injection 5 mg ? ? ?AK (actinic keratosis) (4) ?Left Temple; Mid Upper Vermilion Lip; Left Buccal Krause; Right Buccal Krause ? ?Destruction of lesion - Left Buccal Krause, Left Temple, Mid Upper Vermilion  Lip, Right Buccal Krause ?Complexity: simple   ?Destruction method: cryotherapy   ?Informed consent: discussed and consent obtained   ?Timeout:  patient name, date of birth, surgical site, and procedure verified ?Lesion destroyed using liquid nitrogen: Yes   ?Cryotherapy cycles:  3 ?Outcome: patient tolerated procedure well with no complications   ?Post-procedure details: wound care instructions given   ? ?Toenail fungus ?Right Hallux Toe Nail Plate ?Discussed the risks of lamisil including liver damage and high rate of recurrence.  ?Over the counter derma nail is her preference for today.  ? ?No atypical nevi noted at the time of the visit. ? ?I, Hiilei Gerst, PA-C, have reviewed all documentation's for this visit.  The documentation on 11/02/21 for the exam, diagnosis, procedures and orders are all accurate and complete. ?

## 2021-11-02 NOTE — Patient Instructions (Signed)
Derma nail over the counter polish  ?

## 2021-11-14 ENCOUNTER — Ambulatory Visit: Payer: PPO | Admitting: Adult Health

## 2021-11-15 ENCOUNTER — Other Ambulatory Visit: Payer: Self-pay | Admitting: Physician Assistant

## 2021-11-15 DIAGNOSIS — L080 Pyoderma: Secondary | ICD-10-CM

## 2021-11-16 ENCOUNTER — Other Ambulatory Visit: Payer: Self-pay | Admitting: Physician Assistant

## 2021-11-16 DIAGNOSIS — L080 Pyoderma: Secondary | ICD-10-CM

## 2021-11-17 ENCOUNTER — Telehealth: Payer: Self-pay | Admitting: Physician Assistant

## 2021-11-17 MED ORDER — CEPHALEXIN 500 MG PO CAPS: 500.0000 mg | ORAL_CAPSULE | Freq: Two times a day (BID) | ORAL | 0 refills | Status: AC

## 2021-11-17 NOTE — Telephone Encounter (Signed)
Patient called to let us know that cephalexin helped but she didn't think that that she had enough - per [facility] sheffield ok to send another refill. Sent to patient pharmacy.  ?

## 2021-11-17 NOTE — Telephone Encounter (Signed)
Patient is calling to get a refill on ? ?Cephalexin 500 MG TAKE ONE CAPSULE BY MOUTH TWICE A DAY FOR 10 DAYS ? ?Patient states that her scalp infection is still "raging".  Patient uses ? ?[id number] - [place], [address] (Ph: [phone]) ?

## 2021-11-18 DIAGNOSIS — L309 Dermatitis, unspecified: Secondary | ICD-10-CM | POA: Diagnosis not present

## 2021-11-24 NOTE — H&P (Addendum)
TOTAL KNEE ADMISSION H&P  Patient is being admitted for right total knee arthroplasty.  Subjective:  Chief Complaint: Right knee pain.  HPI: [name], 81 y.o. female has a history of pain and functional disability in the right knee due to arthritis and has failed non-surgical conservative treatments for greater than 12 weeks to include NSAID's and/or analgesics and activity modification. Onset of symptoms was gradual, starting  several  years ago with gradually worsening course since that time. The patient noted no past surgery on the right knee.  Patient currently rates pain in the right knee at 8 out of 10 with activity. Patient has night pain, worsening of pain with activity and weight bearing, and pain with passive range of motion. Patient has evidence of  significant degenerative changes tricompartmentally, with osteophytes present  by imaging studies. There is no active infection.  Patient Active Problem List   Diagnosis Date Noted   Acute metabolic encephalopathy 74/25/9563   AKI (acute kidney injury) ([place])    Pressure injury of skin 01/23/2021   AMS (altered mental status) 01/22/2021   OSA (obstructive sleep apnea) 08/23/2020   History of snoring 07/27/2020   Dysphonia of essential tremor 06/21/2020   Essential tremor 06/21/2020   Chronic insomnia 06/21/2020   Status post total knee replacement, left 04/13/2017   Vitamin D deficiency 04/12/2017   History of chronic kidney disease 04/12/2017   Osteopenia of multiple sites 04/06/2017   Fibromyalgia syndrome 09/11/2016   Other fatigue 09/11/2016   Primary insomnia 09/11/2016   History of osteopenia 09/11/2016   OA (osteoarthritis) of knee 11/08/2015   Essential hypertension, benign 12/16/2013   Pure hypercholesterolemia 12/16/2013    Past Medical History:  Diagnosis Date   BCC (basal cell carcinoma of skin) 03/27/2006   Above Left Upper Lip (MOH's)   Benign essential tremor    Chronic kidney disease    Essential  hypertension, benign    Fibromyalgia    GERD (gastroesophageal reflux disease)    Heart murmur    Hemorrhoids    Hyponatremia    secondary to hctz-recurrent March 2010   Low back pain    facet joint pain   Occipital neuralgia    Osteoarthritis    Osteopenia    Pure hypercholesterolemia 12/16/2013   SCCA (squamous cell carcinoma) of skin 07/15/2002   Left Inner Cheek(in situ)   SCCA (squamous cell carcinoma) of skin 05/06/2004   Right Wing Nose(in situ) -Cx3,5FU   SCCA (squamous cell carcinoma) of skin 01/09/2011   Above Right Upper Lip(in situ)-Cx3   SCCA (squamous cell carcinoma) of skin 01/05/2014   Left Temple(in situ)-Cx3,5FU   SCCA (squamous cell carcinoma) of skin 09/30/2014   Right Wrist(in situ)-tx p bx   Shortness of breath dyspnea    Superficial basal cell carcinoma (BCC) 08/17/1982   Right Side Upper Lip   Tuberculosis    as a child    Past Surgical History:  Procedure Laterality Date   TOTAL KNEE ARTHROPLASTY Left 11/08/2015   Procedure: LEFT TOTAL KNEE ARTHROPLASTY;  Surgeon: Gaynelle Arabian, MD;  Location: [facility];  Service: Orthopedics;  Laterality: Left;   TUBAL LIGATION      Prior to Admission medications   Medication Sig Start Date End Date Taking? Authorizing Provider  amLODipine (NORVASC) 5 MG tablet TAKE ONE TABLET BY MOUTH DAILY 05/14/15   Jettie Booze, MD  atenolol (TENORMIN) 25 MG tablet TAKE ONE TABLET BY MOUTH DAILY 07/12/16   Jettie Booze, MD  cephALEXin [facility])  500 MG capsule Take 1 capsule (500 mg total) by mouth 2 (two) times daily for 28 doses. 11/17/21 12/01/21  Warren Danes, PA-C  cetirizine (ZYRTEC) 5 MG tablet 1 tablet    [provider]  diclofenac Sodium (VOLTAREN) 1 % GEL Apply 4 g topically 4 (four) times daily. 01/06/21   Deno Etienne, DO  furosemide (LASIX) 20 MG tablet TAKE ONE TABLET BY MOUTH EVERY MORNING IF NEEDED FOR SWELLING    [provider]  ipratropium (ATROVENT) 0.06 % nasal spray 2 sprays in  each nostril    [provider]  pregabalin (LYRICA) 75 MG capsule Take 75 mg by mouth daily.    [provider]  primidone (MYSOLINE) 50 MG tablet Take 1 tablet (50 mg total) by mouth 2 (two) times daily. 08/25/21   Dohmeier, Asencion Partridge, MD  simvastatin (ZOCOR) 40 MG tablet Take 1 tablet (40 mg total) by mouth at bedtime. 08/26/15   Jettie Booze, MD  traMADol (ULTRAM) 50 MG tablet Take 2 tablets (100 mg total) by mouth every 12 (twelve) hours as needed. 01/27/21   Mariel Aloe, MD  traZODone (DESYREL) 50 MG tablet TAKE ONE TABLET BY MOUTH EVERY NIGHT AT BEDTIME AS NEEDED FOR SLEEP 09/05/21   Dohmeier, Asencion Partridge, MD  vitamin B-12 (CYANOCOBALAMIN) 500 MCG tablet Take 1 tablet (500 mcg total) by mouth daily. 01/28/21   Mariel Aloe, MD  Vitamin D, Ergocalciferol, (DRISDOL) 1.25 MG (50000 UNIT) CAPS capsule Take 1 capsule by mouth once a week.    [provider]    Allergies  Allergen Reactions   Propoxyphene Other (See Comments)   Erythromycin Base Other (See Comments)   Hctz [Hydrochlorothiazide] Other (See Comments)    Hyponatremia    Lisinopril Other (See Comments)    Hyponatremia   Sulfa Antibiotics Rash    Social History   Socioeconomic History   Marital status: Divorced    Spouse name: Not on file   Number of children: 2   Years of education: Masters   Highest education level: Not on file  Occupational History   Occupation: Retired  Tobacco Use   Smoking status: Never   Smokeless tobacco: Never  Vaping Use   Vaping Use: Never used  Substance and Sexual Activity   Alcohol use: Yes    Alcohol/week: 0.0 standard drinks   Drug use: No   Sexual activity: Not on file  Other Topics Concern   Not on file  Social History Narrative   Denies caffeine use    Social Determinants of Health   Financial Resource Strain: Not on file  Food Insecurity: Not on file  Transportation Needs: Not on file  Physical Activity: Not on file  Stress: Not on file   Social Connections: Not on file  Intimate Partner Violence: Not on file    Tobacco Use: Low Risk    Smoking Tobacco Use: Never   Smokeless Tobacco Use: Never   Passive Exposure: Not on file   Social History   Substance and Sexual Activity  Alcohol Use Yes   Alcohol/week: 0.0 standard drinks    Family History  Problem Relation Age of Onset   Heart disease Father    Prostate cancer Father    Heart disease Mother    Cancer - Ovarian Mother    Congestive Heart Failure Mother    Stroke Sister     Review of Systems  Constitutional:  Negative for chills and fever.  HENT: Negative.    Eyes:  Negative.   Respiratory:  Negative for cough and shortness of breath.   Cardiovascular:  Negative for chest pain and palpitations.  Gastrointestinal:  Negative for abdominal pain, constipation, diarrhea, nausea and vomiting.  Genitourinary:  Negative for dysuria, frequency and urgency.  Musculoskeletal:  Positive for joint pain.  Skin:  Negative for rash.   Objective:  Physical Exam: Well nourished and well developed.  General: Alert and oriented x3, cooperative and pleasant, no acute distress.  Head: normocephalic, atraumatic, neck supple.  Eyes: EOMI.  Abdomen: non-tender to palpation and soft, normoactive bowel sounds. Musculoskeletal:  Right Knee Exam:  Mild tenderness to palpation about the medial and lateral joint line of the right knee.  AROM 0-120 degrees.  No effusion noted.  No instability.  Mild patellofemoral crepitus.   Left Knee Exam:  No tenderness to palpation about the medial or lateral joint line of the right knee.  AROM 0-120 degrees.  No effusion noted.  No instability.  Incision well healed.   Calves soft and nontender. Motor function intact in LE. Strength 5/5 LE bilaterally. Neuro: Distal pulses 2+. Sensation to light touch intact in LE.  Vital signs in last 24 hours: BP: ()/()  Arterial Line BP: ()/()   Imaging Review Plain radiographs  demonstrate severe degenerative joint disease of the right knee. The overall alignment is neutral. The bone quality appears to be adequate for age and reported activity level.  Assessment/Plan:  End stage arthritis, right knee   The patient history, physical examination, clinical judgment of the provider and imaging studies are consistent with end stage degenerative joint disease of the right knee and total knee arthroplasty is deemed medically necessary. The treatment options including medical management, injection therapy arthroscopy and arthroplasty were discussed at length. The risks and benefits of total knee arthroplasty were presented and reviewed. The risks due to aseptic loosening, infection, stiffness, patella tracking problems, thromboembolic complications and other imponderables were discussed. The patient acknowledged the explanation, agreed to proceed with the plan and consent was signed. Patient is being admitted for inpatient treatment for surgery, pain control, PT, OT, prophylactic antibiotics, VTE prophylaxis, progressive ambulation and ADLs and discharge planning. The patient is planning to be discharged  home .  Patient's anticipated LOS is less than 2 midnights, meeting these requirements: - Lives within 1 hour of care - Has a competent adult at home to recover with post-op - NO history of  - Chronic pain requiring opioids  - Diabetes  - Coronary Artery Disease  - Heart failure  - Heart attack  - Stroke  - DVT/VTE  - Cardiac arrhythmia  - Respiratory Failure/COPD  - Renal failure  - Anemia  - Advanced Liver disease  Therapy Plans: EO Disposition: Home with Daughter Planned DVT Prophylaxis: Aspirin 325 mg BID DME Needed: None PCP: Kelton Pillar, MD (clearance received) TXA: IV Allergies: erythromycin (vomiting), lisinopril (cough) Anesthesia Concerns: None BMI: 28.7 Last HgbA1c: n/a  Ph[facility]acy: [facility] (have brought to room)  Other: -not true oxycodone  allergy but oxycodone makes patient feel agitated and "out of control"  - Patient was instructed on what medications to stop prior to surgery. - Follow-up visit in 2 weeks with Dr. Wynelle Link - Begin physical therapy following surgery - Pre-operative lab work as pre-surgical testing - Prescriptions will be provided in hospital at time of discharge  R. Jaynie Bream, PA-C Orthopedic Surgery [facility] [place]

## 2021-12-09 NOTE — Progress Notes (Addendum)
COVID Vaccine Completed:  Yes  Date of COVID positive in last 90 days:  PCP - Kelton Pillar, MD (on chart) Cardiologist - Larae Grooms, MD  Medical clearance on chart from Dr. Laurann Montana (moderate risk)  Chest x-ray - 01-21-21 epic EKG - 01-21-21 Epic Stress Test - greater than 2 years  EPIC ECHO - 01-22-2021 EPIC Cardiac Cath -  Pacemaker/ICD device last checked: Spinal Cord Stimulator:  Bowel Prep -   Sleep Study - 07-19-2020 CPAP -   Fasting Blood Sugar -  Checks Blood Sugar _____ times a day  Blood Thinner Instructions: Aspirin Instructions: Last Dose:  Activity level:  Can go up a flight of stairs and perform activities of daily living without stopping and without symptoms of chest pain or shortness of breath.   Able to exercise without symptoms  Unable to go up a flight of stairs without symptoms of      Anesthesia review: Eval by cardiology for SOB.  HTN, CKD, OSA, tremors  Patient denies shortness of breath, fever, cough and chest pain at PAT appointment   Patient verbalized understanding of instructions that were given to them at the PAT appointment. Patient was also instructed that they will need to review over the PAT instructions again at home before surgery.

## 2021-12-12 NOTE — Patient Instructions (Addendum)
DUE TO SPACE LIMITATIONS, ONLY TWO VISITORS  (aged 82 and older) ARE ALLOWED TO COME WITH YOU AND STAY IN THE WAITING ROOM DURING YOUR PRE OP AND PROCEDURE.   **NO VISITORS ARE ALLOWED IN THE SHORT STAY AREA OR RECOVERY ROOM!!**  IF YOU WILL BE ADMITTED INTO THE HOSPITAL YOU ARE ALLOWED ONLY FOUR SUPPORT PEOPLE DURING VISITATION HOURS (7 AM -8PM)   The support person(s) must pass our screening, and use Hand sanitizing gel. Visitors GUEST BADGE MUST BE WORN VISIBLY  One adult visitor may remain with you overnight and MUST be in the room by 8 P.M.   You are not required to quarantine at this time prior to your surgery. However, you must do this: Hand Hygiene often Do NOT share personal items Notify your provider if you are in close contact with someone who has COVID or you develop fever 100.4 or greater, new onset of sneezing, cough, sore throat, shortness of breath or body aches.       Your procedure is scheduled on:  December 19, 2021  Report to [facility] Main Entr[facility], (Please feel free to use our VALET Service at the [employer])   Report to admitting at:  05:30 AM  +++++Call this number if you have any questions or problems the morning of surgery [phone]  Do not eat food :After Midnight the night prior to your surgery/procedure.  After Midnight you may ONLY have the following liquids until 05:15 AM DAY OF SURGERY  Clear Liquid Diet Water Black Coffee (sugar ok, NO MILK/CREAM OR CREAMERS)  Tea (sugar ok, NO MILK/CREAM OR CREAMERS) regular and decaf                             Plain Jell-O (NO RED)                                           Fruit ices (not with fruit pulp, NO RED)                                     Popsicles (NO RED)                                                                  Juice: apple, WHITE grape, WHITE cranberry Sports drinks like Gatorade (NO RED) Clear broth(vegetable,chicken,beef)            The day of surgery:  Drink ONE (1)  Pre-Surgery Clear Ensure at 05:15 AM the morning of surgery. Drink in one sitting. Do not sip.  This drink was given to you during your hospital  pre-op appointment visit. Nothing else to drink after completing the Pre-Surgery Clear Ensure   If you have questions, please contact your surgeon's office.   FOLLOW ANY ADDITIONAL PRE OP INSTRUCTIONS YOU RECEIVED FROM YOUR SURGEON'S OFFICE!!!   Oral Hygiene is also important to reduce your risk of infection.        Remember - BRUSH YOUR TEETH THE MORNING OF SURGERY WITH YOUR REGULAR TOOTHPASTE  Do NOT smoke after Midnight the night before surgery.  Take ONLY these medicines the morning of surgery with A SIP OF WATER: Amlodipine, atenolol, cetirizine (Zyrtec), Eye drops, Nasal spray,                               Pregabalin (Lyrica), Primidone (Mysoline), tramadol                    You may not have any metal on your body including hair pins, jewelry, and body piercing  Do not wear make-up, lotions, powders, perfumes or deodorant  Do not wear nail polish including gel and S&S, artificial/acrylic nails, or any other type of covering on natural nails including finger and toenails. If you have artificial nails, gel coating, etc. that needs to be removed by a nail salon, please have this removed prior to surgery. Not doing so may mean that your surgery could be canceled or delayed if the surgeon or anesthesia feels like they are unable to monitor you safely.   Do not shave 48 hours prior to surgery to avoid nicks in your skin which may contribute to postoperative infections.   Contacts, Hearing Aids, dentures or bridgework may not be worn into surgery.   You may bring a small overnight bag with you on the day of surgery, only pack items that are not valuable .[facility] IS NOT RESPONSIBLE   FOR VALUABLES THAT ARE LOST OR STOLEN.    Special Instructions: Bring a copy of your healthcare power of attorney and living will documents the day of surgery,  if you wish to have them scanned into your [facility]- EPIC  Please read over the following fact sheets you were given: IF YOU HAVE QUESTIONS ABOUT YOUR PRE-OP INSTRUCTIONS,                            PLEASE CALL [phone] ([place])  [place] - Preparing for Surgery Before surgery, you can play an important role.  Because skin is not sterile, your skin needs to be as free of germs as possible.  You can reduce the number of germs on your skin by washing with CHG (chlorahexidine gluconate) soap before surgery.  CHG is an antiseptic cleaner which kills germs and bonds with the skin to continue killing germs even after washing. Please DO NOT use if you have an allergy to CHG or antibacterial soaps.  If your skin becomes reddened/irritated stop using the CHG and inform your nurse when you arrive at Short Stay. Do not shave (including legs and underarms) for at least 48 hours prior to the first CHG shower.  You may shave your face/neck.  Please follow these instructions carefully:  1.  Shower with CHG Soap the night before surgery and the  morning of surgery.  2.  If you choose to wash your hair, wash your hair first as usual with your normal  shampoo.  3.  After you shampoo, rinse your hair and body thoroughly to remove the shampoo.                             4.  Use CHG as you would any other liquid soap.  You can apply chg directly to the skin and wash.  Gently with a scrungie or clean washcloth.  5.  Apply the  CHG Soap to your body ONLY FROM THE NECK DOWN.   Do   not use on face/ open                           Wound or open sores. Avoid contact with eyes, ears mouth and   genitals (private parts).                       Wash face,  Genitals (private parts) with your normal soap.             6.  Wash thoroughly, paying special attention to the area where your    surgery  will be performed.  7.  Thoroughly rinse your body with warm water from the neck down.  8.  DO NOT shower/wash  with your normal soap after using and rinsing off the CHG Soap.            9.  Pat yourself dry with a clean towel.            10.  Wear clean pajamas.            11.  Place clean sheets on your bed the night of your first shower and do not  sleep with pets.  ON THE DAY OF SURGERY : Do not apply any lotions/deodorants the morning of surgery.  Please wear clean clothes to the hospital/surgery center.    FAILURE TO FOLLOW THESE INSTRUCTIONS MAY RESULT IN THE CANCELLATION OF YOUR SURGERY  PATIENT SIGNATURE_________________________________  NURSE SIGNATURE__________________________________________________________________________________________________________                                                                                                            Incentive Spirometer  An incentive spirometer is a tool that can help keep your lungs clear and active. This tool measures how well you are filling your lungs with each breath. Taking long deep breaths may help reverse or decrease the chance of developing breathing (pulmonary) problems (especially infection) following: A long period of time when you are unable to move or be active. BEFORE THE PROCEDURE  If the spirometer includes an indicator to show your best effort, your nurse or respiratory therapist will set it to a desired goal. If possible, sit up straight or lean slightly forward. Try not to slouch. Hold the incentive spirometer in an upright position. INSTRUCTIONS FOR USE  Sit on the edge of your bed if possible, or sit up as far as you can in bed or on a chair. Hold the incentive spirometer in an upright position. Breathe out normally. Place the mouthpiece in your mouth and seal your lips tightly around it. Breathe in slowly and as deeply as possible, raising the piston or the ball toward the top of the column. Hold your breath for 3-5 seconds or for as long as possible. Allow the piston or ball to fall to the bottom  of the column. Remove the mouthpiece from your mouth and breathe out normally.  Rest for a few seconds and repeat Steps 1 through 7 at least 10 times every 1-2 hours when you are awake. Take your time and take a few normal breaths between deep breaths. The spirometer may include an indicator to show your best effort. Use the indicator as a goal to work toward during each repetition. After each set of 10 deep breaths, practice coughing to be sure your lungs are clear. If you have an incision (the cut made at the time of surgery), support your incision when coughing by placing a pillow or rolled up towels firmly against it. Once you are able to get out of bed, walk around indoors and cough well. You may stop using the incentive spirometer when instructed by your caregiver.  RISKS AND COMPLICATIONS Take your time so you do not get dizzy or light-headed. If you are in pain, you may need to take or ask for pain medication before doing incentive spirometry. It is harder to take a deep breath if you are having pain. AFTER USE Rest and breathe slowly and easily. It can be helpful to keep track of a log of your progress. Your caregiver can provide you with a simple table to help with this. If you are using the spirometer at home, follow these instructions: [place] IF:  You are having difficultly using the spirometer. You have trouble using the spirometer as often as instructed. Your pain medication is not giving enough relief while using the spirometer. You develop fever of 100.5 F (38.1 C) or higher. SEEK IMMEDIATE MEDICAL CARE IF:  You cough up bloody sputum that had not been present before. You develop fever of 102 F (38.9 C) or greater. You develop worsening pain at or near the incision site. MAKE SURE YOU:  Understand these instructions. Will watch your condition. Will get help right away if you are not doing well or get worse. Document Released: 11/06/2006 Document Revised:  09/18/2011 Document Reviewed: 01/07/2007 [facility] Patient Information 2014 [place], [place].    WHAT IS A BLOOD TRANSFUSION? Blood Transfusion Information  A transfusion is the replacement of blood or some of its parts. Blood is made up of multiple cells which provide different functions. Red blood cells carry oxygen and are used for blood loss replacement. White blood cells fight against infection. Platelets control bleeding. Plasma helps clot blood. Other blood products are available for specialized needs, such as hemophilia or other clotting disorders. BEFORE THE TRANSFUSION  Who gives blood for transfusions?  Healthy volunteers who are fully evaluated to make sure their blood is safe. This is blood bank blood. Transfusion therapy is the safest it has ever been in the practice of medicine. Before blood is taken from a donor, a complete history is taken to make sure that person has no history of diseases nor engages in risky social behavior (examples are intravenous drug use or sexual activity with multiple partners). The donor's travel history is screened to minimize risk of transmitting infections, such as malaria. The donated blood is tested for signs of infectious diseases, such as HIV and hepatitis. The blood is then tested to be sure it is compatible with you in order to minimize the chance of a transfusion reaction. If you or a relative donates blood, this is often done in anticipation of surgery and is not appropriate for emergency situations. It takes many days to process the donated blood. RISKS AND COMPLICATIONS Although transfusion therapy is very safe and saves many lives, the main dangers of  transfusion include:  Getting an infectious disease. Developing a transfusion reaction. This is an allergic reaction to something in the blood you were given. Every precaution is taken to prevent this. The decision to have a blood transfusion has been considered carefully by your caregiver  before blood is given. Blood is not given unless the benefits outweigh the risks. AFTER THE TRANSFUSION Right after receiving a blood transfusion, you will usually feel much better and more energetic. This is especially true if your red blood cells have gotten low (anemic). The transfusion raises the level of the red blood cells which carry oxygen, and this usually causes an energy increase. The nurse administering the transfusion will monitor you carefully for complications. HOME CARE INSTRUCTIONS  No special instructions are needed after a transfusion. You may find your energy is better. Speak with your caregiver about any limitations on activity for underlying diseases you may have. SEEK MEDICAL CARE IF:  Your condition is not improving after your transfusion. You develop redness or irritation at the intravenous (IV) site. SEEK IMMEDIATE MEDICAL CARE IF:  Any of the following symptoms occur over the next 12 hours: Shaking chills. You have a temperature by mouth above 102 F (38.9 C), not controlled by medicine. Chest, back, or muscle pain. People around you feel you are not acting correctly or are confused. Shortness of breath or difficulty breathing. Dizziness and fainting. You get a rash or develop hives. You have a decrease in urine output. Your urine turns a dark color or changes to pink, red, or brown. Any of the following symptoms occur over the next 10 days: You have a temperature by mouth above 102 F (38.9 C), not controlled by medicine. Shortness of breath. Weakness after normal activity. The white part of the eye turns yellow (jaundice). You have a decrease in the amount of urine or are urinating less often. Your urine turns a dark color or changes to pink, red, or brown. Document Released: 06/23/2000 Document Revised: 09/18/2011 Document Reviewed: 02/10/2008 [facility] Patient Information 2014 [place],  [place].  _______________________________________________________________________

## 2021-12-13 ENCOUNTER — Encounter (HOSPITAL_COMMUNITY): Payer: Self-pay

## 2021-12-13 ENCOUNTER — Encounter (HOSPITAL_COMMUNITY)
Admission: RE | Admit: 2021-12-13 | Discharge: 2021-12-13 | Disposition: A | Discharge status: Home / Self Care | Payer: PPO | Source: Ambulatory Visit | Attending: Orthopedic Surgery | Admitting: Orthopedic Surgery

## 2021-12-13 ENCOUNTER — Other Ambulatory Visit: Payer: Self-pay

## 2021-12-13 VITALS — BP 149/74 | HR 72 | Temp 98.7°F | Resp 18 | Ht 66.0 in | Wt 170.6 lb

## 2021-12-13 DIAGNOSIS — N189 Chronic kidney disease, unspecified: Secondary | ICD-10-CM | POA: Diagnosis not present

## 2021-12-13 DIAGNOSIS — F1120 Opioid dependence, uncomplicated: Secondary | ICD-10-CM | POA: Diagnosis not present

## 2021-12-13 DIAGNOSIS — G4733 Obstructive sleep apnea (adult) (pediatric): Secondary | ICD-10-CM | POA: Diagnosis not present

## 2021-12-13 DIAGNOSIS — M1711 Unilateral primary osteoarthritis, right knee: Secondary | ICD-10-CM | POA: Diagnosis not present

## 2021-12-13 DIAGNOSIS — Z01818 Encounter for other preprocedural examination: Secondary | ICD-10-CM

## 2021-12-13 DIAGNOSIS — I129 Hypertensive chronic kidney disease with stage 1 through stage 4 chronic kidney disease, or unspecified chronic kidney disease: Secondary | ICD-10-CM | POA: Insufficient documentation

## 2021-12-13 DIAGNOSIS — Z01812 Encounter for preprocedural laboratory examination: Secondary | ICD-10-CM | POA: Insufficient documentation

## 2021-12-13 HISTORY — DX: Sleep apnea, unspecified: G47.30

## 2021-12-13 HISTORY — DX: Pneumonia, unspecified organism: J18.9

## 2021-12-13 HISTORY — DX: Anemia, unspecified: D64.9

## 2021-12-13 LAB — CBC
HCT: 38.4 % (ref 36.0–46.0)
Hemoglobin: 13 g/dL (ref 12.0–15.0)
MCH: 32.2 pg (ref 26.0–34.0)
MCHC: 33.9 g/dL (ref 30.0–36.0)
MCV: 95 fL (ref 80.0–100.0)
Platelets: 253 10*3/uL (ref 150–400)
RBC: 4.04 MIL/uL (ref 3.87–5.11)
RDW: 12.7 % (ref 11.5–15.5)
WBC: 6 10*3/uL (ref 4.0–10.5)
nRBC: 0 % (ref 0.0–0.2)

## 2021-12-13 LAB — SURGICAL PCR SCREEN
MRSA, PCR: NEGATIVE
Staphylococcus aureus: POSITIVE — AB

## 2021-12-13 LAB — COMPREHENSIVE METABOLIC PANEL
ALT: 16 U/L (ref 0–44)
AST: 24 U/L (ref 15–41)
Albumin: 4.2 g/dL (ref 3.5–5.0)
Alkaline Phosphatase: 92 U/L (ref 38–126)
Anion gap: 11 (ref 5–15)
BUN: 11 mg/dL (ref 8–23)
CO2: 25 mmol/L (ref 22–32)
Calcium: 9.4 mg/dL (ref 8.9–10.3)
Chloride: 96 mmol/L — ABNORMAL LOW (ref 98–111)
Creatinine, Ser: 1.03 mg/dL — ABNORMAL HIGH (ref 0.44–1.00)
GFR, Estimated: 54 mL/min — ABNORMAL LOW (ref >60)
Glucose, Bld: 96 mg/dL (ref 70–99)
Potassium: 4.6 mmol/L (ref 3.5–5.1)
Sodium: 132 mmol/L — ABNORMAL LOW (ref 135–145)
Total Bilirubin: 0.8 mg/dL (ref 0.3–1.2)
Total Protein: 7.4 g/dL (ref 6.5–8.1)

## 2021-12-13 NOTE — Progress Notes (Signed)
PCR results sent to Dr. Aluisio to review. 

## 2021-12-14 NOTE — Anesthesia Preprocedure Evaluation (Addendum)
Anesthesia Evaluation  Patient identified by MRN, date of birth, ID band Patient awake    Reviewed: Allergy & Precautions, NPO status , Patient's Chart, lab work & pertinent test results  Airway Mallampati: II  TM Distance: >3 FB Neck ROM: Full    Dental no notable dental hx. (+) Implants, Dental Advisory Given, Teeth Intact   Pulmonary shortness of breath and with exertion, sleep apnea and Continuous Positive Airway Pressure Ventilation ,    Pulmonary exam normal breath sounds clear to auscultation       Cardiovascular hypertension, Pt. on medications Normal cardiovascular exam Rhythm:Regular Rate:Normal  01/2021 Echo 1. Murmur secondary to hyperdynamic LV contraction with turbulence  through outflow tract. There is no significant outflow tract gradient,  however.  2. Left ventricular ejection fraction, by estimation, is >75%. The left  ventricle has hyperdynamic function. The left ventricle has no regional  wall motion abnormalities. Left ventricular diastolic parameters are  consistent with Grade I diastolic  dysfunction (impaired relaxation).  3. Right ventricular systolic function is normal. The right ventricular  size is normal. There is normal pulmonary artery systolic pressure. The  estimated right ventricular systolic pressure is 35.5 mmHg.  4. Left atrial size was mildly dilated.  5. The mitral valve is normal in structure. Trivial mitral valve  regurgitation. No evidence of mitral stenosis.  6. The aortic valve is normal in structure. Aortic valve regurgitation is  not visualized. No aortic stenosis is present.  7. The inferior vena cava is normal in size with greater than 50%  respiratory variability, suggesting right atrial pressure of 3 mmHg.    Neuro/Psych    GI/Hepatic   Endo/Other  negative endocrine ROS  Renal/GU Renal InsufficiencyRenal diseaseLab Results      Component                Value                Date                      CREATININE               1.03 (H)            12/13/2021              K                        4.6                 12/13/2021                    Musculoskeletal  (+) Arthritis , Osteoarthritis,  Fibromyalgia -  Abdominal   Peds  Hematology Lab Results      Component                Value               Date                          HGB                      13.0                12/13/2021                HCT  38.4                12/13/2021               PLT                      253                 12/13/2021              Anesthesia Other Findings All: propoxyphine, Hctz, Sulfa, Emycin, lisinopril  Reproductive/Obstetrics                           Anesthesia Physical Anesthesia Plan  ASA: 3  Anesthesia Plan: Spinal and Regional   Post-op Pain Management: Regional block* and Ofirmev IV (intra-op)*   Induction:   PONV Risk Score and Plan: Treatment may vary due to age or medical condition  Airway Management Planned: Nasal Cannula and Natural Airway  Additional Equipment: None  Intra-op Plan:   Post-operative Plan:   Informed Consent: I have reviewed the patients History and Physical, chart, labs and discussed the procedure including the risks, benefits and alternatives for the proposed anesthesia with the patient or authorized representative who has indicated his/her understanding and acceptance.     Dental advisory given  Plan Discussed with: CRNA  Anesthesia Plan Comments: (See APP note by Durel Salts, FNP   Sp w L adductor canal)      Anesthesia Quick Evaluation

## 2021-12-14 NOTE — Progress Notes (Signed)
Anesthesia Chart Review:   Case: [id number] Date/Time: 12/19/21 0805   Procedure: TOTAL KNEE ARTHROPLASTY (Right: Knee)   Anesthesia type: Choice   Pre-op diagnosis: Right knee osteoarthritis   [facility] ROOM 10 / [facility]   Surgeons: Gaynelle Arabian, MD       DISCUSSION: Pt is 81 years old with hx HTN, OSA (no CPAP), CKD, anemia  Hospitalized 7/15-21/22 for acute metabolic encephalopathy, possible underlying cognitive disorder involved in symptoms. Complicated by AKI, pressure injury of skin (B buttock)  VS: BP (!) 149/74   Pulse 72   Temp 37.1 C (Oral)   Resp 18   Ht '5\' 6"'$  (1.676 m)   Wt 77.4 kg   SpO2 97%   BMI 27.54 kg/m   PROVIDERS: - PCP is Kelton Pillar, MD who cleared pt for surgery at moderate risk due to h/o OSA, narcotic dependence (tramadol), noting "has neurology f/u for tremor and OSA - not on CPAP. She will discuss with them prior to surgery" - Neurologist is Larey Seat, MD who sees pt for tremor. Last office visit 08/23/20. Pt has not had input from neuro on OSA management since last visit - Cardiologist is Larae Grooms, MD who sees pt for DOE. Last office visit 03/10/21. 1 yr f/u recommended   LABS: Labs reviewed: Acceptable for surgery. (all labs ordered are listed, but only abnormal results are displayed)  Labs Reviewed  SURGICAL PCR SCREEN - Abnormal; Notable for the following components:      Result Value   Staphylococcus aureus POSITIVE (*)    All other components within normal limits  COMPREHENSIVE METABOLIC PANEL - Abnormal; Notable for the following components:   Sodium 132 (*)    Chloride 96 (*)    Creatinine, Ser 1.03 (*)    GFR, Estimated 54 (*)    All other components within normal limits  CBC  TYPE AND SCREEN     IMAGES: 1 view CXR 01/21/21:  1. No acute intrathoracic process.  EKG 01/21/21: Sinus tachycardia with irregular rate. Consider left ventricular hypertrophy. Nonspecific T abnormalities, lateral leads   CV: Echo  01/22/21:  1. Murmur secondary to hyperdynamic LV contraction with turbulence through outflow tract. There is no significant outflow tract gradient, however.  2. Left ventricular ejection fraction, by estimation, is >75%. The left ventricle has hyperdynamic function. The left ventricle has no regional wall motion abnormalities. Left ventricular diastolic parameters are consistent with Grade I diastolic dysfunction (impaired relaxation).  3. Right ventricular systolic function is normal. The right ventricular size is normal. There is normal pulmonary artery systolic pressure. The estimated right ventricular systolic pressure is 82.9 mmHg.  4. Left atrial size was mildly dilated.  5. The mitral valve is normal in structure. Trivial mitral valve regurgitation. No evidence of mitral stenosis.  6. The aortic valve is normal in structure. Aortic valve regurgitation is not visualized. No aortic stenosis is present.  7. The inferior vena cava is normal in size with greater than 50% respiratory variability, suggesting right atrial pressure of 3 mmHg.    Past Medical History:  Diagnosis Date   Anemia    BCC (basal cell carcinoma of skin) 03/27/2006   Above Left Upper Lip (MOH's)   Benign essential tremor    Chronic kidney disease    Essential hypertension, benign    Fibromyalgia    GERD (gastroesophageal reflux disease)    No longer a problem   Heart murmur    Hemorrhoids    Hyponatremia  secondary to hctz-recurrent March 2010   Low back pain    facet joint pain   Occipital neuralgia    Osteoarthritis    Osteopenia    Pneumonia    Pure hypercholesterolemia 12/16/2013   SCCA (squamous cell carcinoma) of skin 07/15/2002   Left Inner Cheek(in situ)   SCCA (squamous cell carcinoma) of skin 05/06/2004   Right Wing Nose(in situ) -Cx3,5FU   SCCA (squamous cell carcinoma) of skin 01/09/2011   Above Right Upper Lip(in situ)-Cx3   SCCA (squamous cell carcinoma) of skin 01/05/2014   Left  Temple(in situ)-Cx3,5FU   SCCA (squamous cell carcinoma) of skin 09/30/2014   Right Wrist(in situ)-tx p bx   Shortness of breath dyspnea    Sleep apnea    Mild sleep apnea   Superficial basal cell carcinoma (BCC) 08/17/1982   Right Side Upper Lip   Tuberculosis    as a child    Past Surgical History:  Procedure Laterality Date   TOTAL KNEE ARTHROPLASTY Left 11/08/2015   Procedure: LEFT TOTAL KNEE ARTHROPLASTY;  Surgeon: Gaynelle Arabian, MD;  Location: [facility];  Service: Orthopedics;  Laterality: Left;   TUBAL LIGATION      MEDICATIONS:  amLODipine (NORVASC) 5 MG tablet   atenolol (TENORMIN) 25 MG tablet   cetirizine (ZYRTEC) 10 MG tablet   diclofenac Sodium (VOLTAREN) 1 % GEL   furosemide (LASIX) 20 MG tablet   hydroxypropyl methylcellulose / hypromellose (ISOPTO TEARS / GONIOVISC) 2.5 % ophthalmic solution   pregabalin (LYRICA) 75 MG capsule   primidone (MYSOLINE) 50 MG tablet   simvastatin (ZOCOR) 40 MG tablet   sodium chloride (OCEAN) 0.65 % SOLN nasal spray   traMADol (ULTRAM) 50 MG tablet   traZODone (DESYREL) 50 MG tablet   vitamin B-12 (CYANOCOBALAMIN) 500 MCG tablet   Vitamin D, Ergocalciferol, (DRISDOL) 1.25 MG (50000 UNIT) CAPS capsule   No current facility-administered medications for this encounter.    If no changes, I anticipate pt can proceed with surgery as scheduled.   Willeen Cass, PhD, FNP-BC [facility] Short Stay Surgical Center/Anesthesiology Phone: [phone] 12/14/2021 11:21 AM

## 2021-12-19 ENCOUNTER — Encounter (HOSPITAL_COMMUNITY): Payer: Self-pay | Admitting: Orthopedic Surgery

## 2021-12-19 ENCOUNTER — Observation Stay (HOSPITAL_COMMUNITY)
Admission: RE | Admit: 2021-12-19 | Discharge: 2021-12-20 | Disposition: A | Discharge status: Home / Self Care | Payer: PPO | Attending: Orthopedic Surgery | Admitting: Orthopedic Surgery

## 2021-12-19 ENCOUNTER — Other Ambulatory Visit: Payer: Self-pay

## 2021-12-19 ENCOUNTER — Ambulatory Visit (HOSPITAL_COMMUNITY): Payer: PPO | Admitting: Emergency Medicine

## 2021-12-19 ENCOUNTER — Ambulatory Visit (HOSPITAL_BASED_OUTPATIENT_CLINIC_OR_DEPARTMENT_OTHER): Payer: PPO | Admitting: Anesthesiology

## 2021-12-19 ENCOUNTER — Encounter (HOSPITAL_COMMUNITY)
Admission: RE | Disposition: A | Discharge status: Home / Self Care | Payer: Self-pay | Source: Home / Self Care | Attending: Orthopedic Surgery

## 2021-12-19 DIAGNOSIS — N189 Chronic kidney disease, unspecified: Secondary | ICD-10-CM | POA: Diagnosis not present

## 2021-12-19 DIAGNOSIS — Z85828 Personal history of other malignant neoplasm of skin: Secondary | ICD-10-CM | POA: Insufficient documentation

## 2021-12-19 DIAGNOSIS — I129 Hypertensive chronic kidney disease with stage 1 through stage 4 chronic kidney disease, or unspecified chronic kidney disease: Secondary | ICD-10-CM | POA: Diagnosis not present

## 2021-12-19 DIAGNOSIS — G4733 Obstructive sleep apnea (adult) (pediatric): Secondary | ICD-10-CM | POA: Diagnosis not present

## 2021-12-19 DIAGNOSIS — Z9989 Dependence on other enabling machines and devices: Secondary | ICD-10-CM | POA: Diagnosis not present

## 2021-12-19 DIAGNOSIS — I10 Essential (primary) hypertension: Secondary | ICD-10-CM | POA: Diagnosis not present

## 2021-12-19 DIAGNOSIS — Z79899 Other long term (current) drug therapy: Secondary | ICD-10-CM | POA: Insufficient documentation

## 2021-12-19 DIAGNOSIS — Z96652 Presence of left artificial knee joint: Secondary | ICD-10-CM | POA: Diagnosis not present

## 2021-12-19 DIAGNOSIS — M1711 Unilateral primary osteoarthritis, right knee: Secondary | ICD-10-CM

## 2021-12-19 DIAGNOSIS — G8918 Other acute postprocedural pain: Secondary | ICD-10-CM | POA: Diagnosis not present

## 2021-12-19 HISTORY — PX: TOTAL KNEE ARTHROPLASTY: SHX125

## 2021-12-19 LAB — GLUCOSE, CAPILLARY: Glucose-Capillary: 160 mg/dL — ABNORMAL HIGH (ref 70–99)

## 2021-12-19 LAB — TYPE AND SCREEN
ABO/RH(D): AB POS
Antibody Screen: NEGATIVE

## 2021-12-19 SURGERY — ARTHROPLASTY, KNEE, TOTAL
Anesthesia: Regional | Site: Knee | Laterality: Right

## 2021-12-19 MED ORDER — SIMVASTATIN 40 MG PO TABS: 40.0000 mg | ORAL_TABLET | Freq: Every day | ORAL | Status: DC

## 2021-12-19 MED ORDER — METHOCARBAMOL 500 MG PO TABS
500.0000 mg | ORAL_TABLET | Freq: Four times a day (QID) | ORAL | Status: DC | PRN
  Administered 2021-12-20 (×2): 500 mg via ORAL
  Filled 2021-12-19 (×2): qty 1

## 2021-12-19 MED ORDER — AMLODIPINE BESYLATE 5 MG PO TABS
5.0000 mg | ORAL_TABLET | Freq: Every day | ORAL | Status: DC
  Administered 2021-12-20: 5 mg via ORAL
  Filled 2021-12-19: qty 1

## 2021-12-19 MED ORDER — CEFAZOLIN SODIUM-DEXTROSE 2-4 GM/100ML-% IV SOLN
2.0000 g | Freq: Four times a day (QID) | INTRAVENOUS | Status: AC
  Administered 2021-12-19 (×2): 2 g via INTRAVENOUS
  Filled 2021-12-19 (×2): qty 100

## 2021-12-19 MED ORDER — SODIUM CHLORIDE (PF) 0.9 % IJ SOLN
INTRAMUSCULAR | Status: AC
  Filled 2021-12-19: qty 50

## 2021-12-19 MED ORDER — MIDAZOLAM HCL 2 MG/2ML IJ SOLN
INTRAMUSCULAR | Status: AC
  Filled 2021-12-19: qty 2

## 2021-12-19 MED ORDER — ONDANSETRON HCL 4 MG/2ML IJ SOLN
INTRAMUSCULAR | Status: DC | PRN
  Administered 2021-12-19: 4 mg via INTRAVENOUS

## 2021-12-19 MED ORDER — METOCLOPRAMIDE HCL 5 MG/ML IJ SOLN: 5.0000 mg | Freq: Three times a day (TID) | INTRAMUSCULAR | Status: DC | PRN

## 2021-12-19 MED ORDER — PHENOL 1.4 % MT LIQD: 1.0000 | OROMUCOSAL | Status: DC | PRN

## 2021-12-19 MED ORDER — 0.9 % SODIUM CHLORIDE (POUR BTL) OPTIME
TOPICAL | Status: DC | PRN
  Administered 2021-12-19: 1000 mL

## 2021-12-19 MED ORDER — PREGABALIN 75 MG PO CAPS
75.0000 mg | ORAL_CAPSULE | Freq: Every day | ORAL | Status: DC
  Administered 2021-12-19 – 2021-12-20 (×2): 75 mg via ORAL
  Filled 2021-12-19 (×2): qty 1

## 2021-12-19 MED ORDER — PROPOFOL 500 MG/50ML IV EMUL
INTRAVENOUS | Status: DC | PRN
  Administered 2021-12-19: 25 ug/kg/min via INTRAVENOUS

## 2021-12-19 MED ORDER — DOCUSATE SODIUM 100 MG PO CAPS
100.0000 mg | ORAL_CAPSULE | Freq: Two times a day (BID) | ORAL | Status: DC
  Filled 2021-12-19: qty 1

## 2021-12-19 MED ORDER — PROPOFOL 1000 MG/100ML IV EMUL
INTRAVENOUS | Status: AC
Start: 2021-12-19 — End: ?
  Filled 2021-12-19: qty 100

## 2021-12-19 MED ORDER — SODIUM CHLORIDE (PF) 0.9 % IJ SOLN
INTRAMUSCULAR | Status: AC
  Filled 2021-12-19: qty 10

## 2021-12-19 MED ORDER — FENTANYL CITRATE PF 50 MCG/ML IJ SOSY: 25.0000 ug | PREFILLED_SYRINGE | INTRAMUSCULAR | Status: DC | PRN

## 2021-12-19 MED ORDER — ONDANSETRON HCL 4 MG/2ML IJ SOLN
INTRAMUSCULAR | Status: AC
  Filled 2021-12-19: qty 2

## 2021-12-19 MED ORDER — CLONIDINE HCL (ANALGESIA) 100 MCG/ML EP SOLN
EPIDURAL | Status: DC | PRN
  Administered 2021-12-19: 100 ug

## 2021-12-19 MED ORDER — LIDOCAINE HCL (CARDIAC) PF 100 MG/5ML IV SOSY
PREFILLED_SYRINGE | INTRAVENOUS | Status: DC | PRN
  Administered 2021-12-19: 50 mg via INTRAVENOUS

## 2021-12-19 MED ORDER — LIDOCAINE HCL (PF) 2 % IJ SOLN
INTRAMUSCULAR | Status: AC
Start: 2021-12-19 — End: ?
  Filled 2021-12-19: qty 5

## 2021-12-19 MED ORDER — POVIDONE-IODINE 10 % EX SWAB
2.0000 "application " | Freq: Once | CUTANEOUS | Status: AC
  Administered 2021-12-19: 2 via TOPICAL

## 2021-12-19 MED ORDER — METOCLOPRAMIDE HCL 5 MG PO TABS: 5.0000 mg | ORAL_TABLET | Freq: Three times a day (TID) | ORAL | Status: DC | PRN

## 2021-12-19 MED ORDER — PHENYLEPHRINE HCL-NACL 20-0.9 MG/250ML-% IV SOLN
INTRAVENOUS | Status: AC
  Filled 2021-12-19: qty 250

## 2021-12-19 MED ORDER — MENTHOL 3 MG MT LOZG: 1.0000 | LOZENGE | OROMUCOSAL | Status: DC | PRN

## 2021-12-19 MED ORDER — FUROSEMIDE 20 MG PO TABS: 20.0000 mg | ORAL_TABLET | Freq: Every day | ORAL | Status: DC

## 2021-12-19 MED ORDER — LACTATED RINGERS IV SOLN: INTRAVENOUS | Status: DC

## 2021-12-19 MED ORDER — ONDANSETRON HCL 4 MG/2ML IJ SOLN: 4.0000 mg | Freq: Once | INTRAMUSCULAR | Status: DC | PRN

## 2021-12-19 MED ORDER — ONDANSETRON HCL 4 MG PO TABS: 4.0000 mg | ORAL_TABLET | Freq: Four times a day (QID) | ORAL | Status: DC | PRN

## 2021-12-19 MED ORDER — PHENYLEPHRINE HCL-NACL 20-0.9 MG/250ML-% IV SOLN
INTRAVENOUS | Status: DC | PRN
  Administered 2021-12-19: 30 ug/min via INTRAVENOUS

## 2021-12-19 MED ORDER — DIPHENHYDRAMINE HCL 12.5 MG/5ML PO ELIX: 12.5000 mg | ORAL_SOLUTION | ORAL | Status: DC | PRN

## 2021-12-19 MED ORDER — ASPIRIN 325 MG PO TBEC
325.0000 mg | DELAYED_RELEASE_TABLET | Freq: Two times a day (BID) | ORAL | Status: DC
  Administered 2021-12-20: 325 mg via ORAL
  Filled 2021-12-19: qty 1

## 2021-12-19 MED ORDER — ATENOLOL 25 MG PO TABS: 25.0000 mg | ORAL_TABLET | Freq: Every day | ORAL | Status: DC

## 2021-12-19 MED ORDER — BUPIVACAINE IN DEXTROSE 0.75-8.25 % IT SOLN
INTRATHECAL | Status: DC | PRN
  Administered 2021-12-19: 12 mg via INTRATHECAL

## 2021-12-19 MED ORDER — CEFAZOLIN SODIUM-DEXTROSE 2-4 GM/100ML-% IV SOLN
2.0000 g | INTRAVENOUS | Status: AC
  Administered 2021-12-19: 2 g via INTRAVENOUS
  Filled 2021-12-19: qty 100

## 2021-12-19 MED ORDER — POLYETHYLENE GLYCOL 3350 17 G PO PACK: 17.0000 g | PACK | Freq: Every day | ORAL | Status: DC | PRN

## 2021-12-19 MED ORDER — DEXAMETHASONE SODIUM PHOSPHATE 10 MG/ML IJ SOLN
INTRAMUSCULAR | Status: AC
Start: 2021-12-19 — End: ?
  Filled 2021-12-19: qty 1

## 2021-12-19 MED ORDER — DEXAMETHASONE SODIUM PHOSPHATE 10 MG/ML IJ SOLN
10.0000 mg | Freq: Once | INTRAMUSCULAR | Status: AC
  Administered 2021-12-20: 10 mg via INTRAVENOUS
  Filled 2021-12-19: qty 1

## 2021-12-19 MED ORDER — SODIUM CHLORIDE 0.9 % IR SOLN
Status: DC | PRN
  Administered 2021-12-19: 1000 mL

## 2021-12-19 MED ORDER — FENTANYL CITRATE (PF) 100 MCG/2ML IJ SOLN
INTRAMUSCULAR | Status: DC | PRN
  Administered 2021-12-19: 50 ug via INTRAVENOUS
  Administered 2021-12-19 (×2): 25 ug via INTRAVENOUS

## 2021-12-19 MED ORDER — ATENOLOL 25 MG PO TABS
25.0000 mg | ORAL_TABLET | Freq: Every day | ORAL | Status: DC
Start: 2021-12-19 — End: 2021-12-20
  Administered 2021-12-19: 25 mg via ORAL
  Filled 2021-12-19: qty 1

## 2021-12-19 MED ORDER — ACETAMINOPHEN 500 MG PO TABS
1000.0000 mg | ORAL_TABLET | Freq: Four times a day (QID) | ORAL | Status: AC
  Administered 2021-12-19 – 2021-12-20 (×3): 1000 mg via ORAL
  Filled 2021-12-19 (×3): qty 2

## 2021-12-19 MED ORDER — TRAMADOL HCL 50 MG PO TABS
50.0000 mg | ORAL_TABLET | Freq: Four times a day (QID) | ORAL | Status: DC | PRN
  Administered 2021-12-19 – 2021-12-20 (×3): 100 mg via ORAL
  Filled 2021-12-19 (×3): qty 2

## 2021-12-19 MED ORDER — LORATADINE 10 MG PO TABS
10.0000 mg | ORAL_TABLET | Freq: Every day | ORAL | Status: DC
  Administered 2021-12-20: 10 mg via ORAL
  Filled 2021-12-19: qty 1

## 2021-12-19 MED ORDER — ACETAMINOPHEN 10 MG/ML IV SOLN
1000.0000 mg | Freq: Four times a day (QID) | INTRAVENOUS | Status: DC
Start: 2021-12-19 — End: 2021-12-19
  Administered 2021-12-19: 1000 mg via INTRAVENOUS
  Filled 2021-12-19: qty 100

## 2021-12-19 MED ORDER — CHLORHEXIDINE GLUCONATE 0.12 % MT SOLN
15.0000 mL | Freq: Once | OROMUCOSAL | Status: AC
  Administered 2021-12-19: 15 mL via OROMUCOSAL

## 2021-12-19 MED ORDER — FENTANYL CITRATE PF 50 MCG/ML IJ SOSY
PREFILLED_SYRINGE | INTRAMUSCULAR | Status: AC
  Administered 2021-12-19: 50 ug via INTRAVENOUS
  Filled 2021-12-19: qty 2

## 2021-12-19 MED ORDER — FENTANYL CITRATE (PF) 100 MCG/2ML IJ SOLN
INTRAMUSCULAR | Status: AC
  Filled 2021-12-19: qty 2

## 2021-12-19 MED ORDER — TRANEXAMIC ACID-NACL 1000-0.7 MG/100ML-% IV SOLN
1000.0000 mg | INTRAVENOUS | Status: AC
  Administered 2021-12-19: 1000 mg via INTRAVENOUS
  Filled 2021-12-19: qty 100

## 2021-12-19 MED ORDER — MORPHINE SULFATE (PF) 2 MG/ML IV SOLN
1.0000 mg | INTRAVENOUS | Status: DC | PRN
  Administered 2021-12-19 (×2): 2 mg via INTRAVENOUS
  Filled 2021-12-19 (×2): qty 1

## 2021-12-19 MED ORDER — BUPIVACAINE LIPOSOME 1.3 % IJ SUSP
INTRAMUSCULAR | Status: AC
  Filled 2021-12-19: qty 20

## 2021-12-19 MED ORDER — STERILE WATER FOR IRRIGATION IR SOLN
Status: DC | PRN
  Administered 2021-12-19: 2000 mL

## 2021-12-19 MED ORDER — ONDANSETRON HCL 4 MG/2ML IJ SOLN: 4.0000 mg | Freq: Four times a day (QID) | INTRAMUSCULAR | Status: DC | PRN

## 2021-12-19 MED ORDER — SODIUM CHLORIDE (PF) 0.9 % IJ SOLN
INTRAMUSCULAR | Status: DC | PRN
  Administered 2021-12-19: 60 mL

## 2021-12-19 MED ORDER — TRAZODONE HCL 50 MG PO TABS
50.0000 mg | ORAL_TABLET | Freq: Every evening | ORAL | Status: DC | PRN
  Administered 2021-12-19: 50 mg via ORAL
  Filled 2021-12-19: qty 1

## 2021-12-19 MED ORDER — FLEET ENEMA 7-19 GM/118ML RE ENEM: 1.0000 | ENEMA | Freq: Once | RECTAL | Status: DC | PRN

## 2021-12-19 MED ORDER — BISACODYL 10 MG RE SUPP: 10.0000 mg | Freq: Every day | RECTAL | Status: DC | PRN

## 2021-12-19 MED ORDER — BUPIVACAINE LIPOSOME 1.3 % IJ SUSP
INTRAMUSCULAR | Status: DC | PRN
  Administered 2021-12-19: 20 mL

## 2021-12-19 MED ORDER — FENTANYL CITRATE PF 50 MCG/ML IJ SOSY: 50.0000 ug | PREFILLED_SYRINGE | INTRAMUSCULAR | Status: DC

## 2021-12-19 MED ORDER — DEXAMETHASONE SODIUM PHOSPHATE 10 MG/ML IJ SOLN
8.0000 mg | Freq: Once | INTRAMUSCULAR | Status: AC
  Administered 2021-12-19: 8 mg via INTRAVENOUS

## 2021-12-19 MED ORDER — ACETAMINOPHEN 10 MG/ML IV SOLN: 1000.0000 mg | Freq: Once | INTRAVENOUS | Status: DC | PRN

## 2021-12-19 MED ORDER — METHOCARBAMOL 500 MG IVPB - SIMPLE MED
500.0000 mg | Freq: Four times a day (QID) | INTRAVENOUS | Status: DC | PRN
  Administered 2021-12-19: 500 mg via INTRAVENOUS
  Filled 2021-12-19: qty 500

## 2021-12-19 MED ORDER — PRIMIDONE 50 MG PO TABS
50.0000 mg | ORAL_TABLET | Freq: Two times a day (BID) | ORAL | Status: DC
  Administered 2021-12-19 – 2021-12-20 (×3): 50 mg via ORAL
  Filled 2021-12-19 (×3): qty 1

## 2021-12-19 MED ORDER — OXYCODONE HCL 5 MG PO TABS
5.0000 mg | ORAL_TABLET | ORAL | Status: DC | PRN
  Filled 2021-12-19: qty 2

## 2021-12-19 MED ORDER — SODIUM CHLORIDE 0.9 % IV SOLN: INTRAVENOUS | Status: DC

## 2021-12-19 MED ORDER — BUPIVACAINE LIPOSOME 1.3 % IJ SUSP: 20.0000 mL | Freq: Once | INTRAMUSCULAR | Status: DC

## 2021-12-19 MED ORDER — ORAL CARE MOUTH RINSE: 15.0000 mL | Freq: Once | OROMUCOSAL | Status: AC

## 2021-12-19 SURGICAL SUPPLY — 59 items
BAG COUNTER SPONGE SURGICOUNT (BAG) IMPLANT
BAG SPEC THK2 15X12 ZIP CLS (MISCELLANEOUS) ×1
BAG SPNG CNTER NS LX DISP (BAG)
BAG ZIPLOCK 12X15 (MISCELLANEOUS) ×3 IMPLANT
BLADE SAG 18X100X1.27 (BLADE) ×3 IMPLANT
BLADE SAW SGTL 11.0X1.19X90.0M (BLADE) ×3 IMPLANT
BNDG ELASTIC 6X5.8 VLCR STR LF (GAUZE/BANDAGES/DRESSINGS) ×3 IMPLANT
BOWL SMART MIX CTS (DISPOSABLE) ×3 IMPLANT
CEMENT HV SMART SET (Cement) ×6 IMPLANT
CEMENT TIBIA MBT (Knees) IMPLANT
COVER SURGICAL LIGHT HANDLE (MISCELLANEOUS) ×3 IMPLANT
CUFF TOURN SGL QUICK 34 (TOURNIQUET CUFF) ×2
CUFF TRNQT CYL 34X4.125X (TOURNIQUET CUFF) ×2 IMPLANT
DRAPE INCISE IOBAN 66X45 STRL (DRAPES) ×3 IMPLANT
DRAPE U-SHAPE 47X51 STRL (DRAPES) ×3 IMPLANT
DRSG AQUACEL AG ADV 3.5X10 (GAUZE/BANDAGES/DRESSINGS) ×3 IMPLANT
DURAPREP 26ML APPLICATOR (WOUND CARE) ×3 IMPLANT
ELECT REM PT RETURN 15FT ADLT (MISCELLANEOUS) ×3 IMPLANT
FEMUR SIGMA PS SZ 3.0 R (Femur) ×1 IMPLANT
GLOVE BIO SURGEON STRL SZ 6.5 (GLOVE) IMPLANT
GLOVE BIO SURGEON STRL SZ7.5 (GLOVE) IMPLANT
GLOVE BIO SURGEON STRL SZ8 (GLOVE) ×3 IMPLANT
GLOVE BIOGEL PI IND STRL 6.5 (GLOVE) IMPLANT
GLOVE BIOGEL PI IND STRL 7.0 (GLOVE) IMPLANT
GLOVE BIOGEL PI IND STRL 8 (GLOVE) ×2 IMPLANT
GLOVE BIOGEL PI INDICATOR 6.5 (GLOVE)
GLOVE BIOGEL PI INDICATOR 7.0 (GLOVE)
GLOVE BIOGEL PI INDICATOR 8 (GLOVE) ×1
GOWN STRL REUS W/ TWL LRG LVL3 (GOWN DISPOSABLE) ×2 IMPLANT
GOWN STRL REUS W/ TWL XL LVL3 (GOWN DISPOSABLE) IMPLANT
GOWN STRL REUS W/TWL LRG LVL3 (GOWN DISPOSABLE) ×2
GOWN STRL REUS W/TWL XL LVL3 (GOWN DISPOSABLE)
HANDPIECE INTERPULSE COAX TIP (DISPOSABLE) ×2
HOLDER FOLEY CATH W/STRAP (MISCELLANEOUS) IMPLANT
IMMOBILIZER KNEE 20 (SOFTGOODS) ×2
IMMOBILIZER KNEE 20 THIGH 36 (SOFTGOODS) ×2 IMPLANT
KIT TURNOVER KIT A (KITS) IMPLANT
MANIFOLD NEPTUNE II (INSTRUMENTS) ×3 IMPLANT
NS IRRIG 1000ML POUR BTL (IV SOLUTION) ×3 IMPLANT
PACK TOTAL KNEE CUSTOM (KITS) ×3 IMPLANT
PADDING CAST ABS 6INX4YD NS (CAST SUPPLIES) ×1
PADDING CAST ABS COTTON 6X4 NS (CAST SUPPLIES) IMPLANT
PADDING CAST COTTON 6X4 STRL (CAST SUPPLIES) ×6 IMPLANT
PATELLA DOME PFC 35MM (Knees) ×1 IMPLANT
PLATE ROT INSERT 12.5MM SIZE 3 (Plate) ×1 IMPLANT
PROTECTOR NERVE ULNAR (MISCELLANEOUS) ×3 IMPLANT
SET HNDPC FAN SPRY TIP SCT (DISPOSABLE) ×2 IMPLANT
SPIKE FLUID TRANSFER (MISCELLANEOUS) ×3 IMPLANT
STRIP CLOSURE SKIN 1/2X4 (GAUZE/BANDAGES/DRESSINGS) ×6 IMPLANT
SUT MNCRL AB 4-0 PS2 18 (SUTURE) ×3 IMPLANT
SUT STRATAFIX 0 PDS 27 VIOLET (SUTURE) ×2
SUT VIC AB 2-0 CT1 27 (SUTURE) ×6
SUT VIC AB 2-0 CT1 TAPERPNT 27 (SUTURE) ×6 IMPLANT
SUTURE STRATFX 0 PDS 27 VIOLET (SUTURE) ×2 IMPLANT
TIBIA MBT CEMENT (Knees) ×2 IMPLANT
TRAY FOLEY MTR SLVR 16FR STAT (SET/KITS/TRAYS/PACK) ×3 IMPLANT
TUBE SUCTION HIGH CAP CLEAR NV (SUCTIONS) ×3 IMPLANT
WATER STERILE IRR 1000ML POUR (IV SOLUTION) ×6 IMPLANT
WRAP KNEE MAXI GEL POST OP (GAUZE/BANDAGES/DRESSINGS) ×3 IMPLANT

## 2021-12-19 NOTE — Anesthesia Postprocedure Evaluation (Signed)
Anesthesia Post Note  Patient: [name]  Procedure(s) Performed: TOTAL KNEE ARTHROPLASTY (Right: Knee)     Patient location during evaluation: Nursing Unit Anesthesia Type: Regional and Spinal Level of consciousness: oriented and awake and alert Pain management: pain level controlled Vital Signs Assessment: post-procedure vital signs reviewed and stable Respiratory status: spontaneous breathing and respiratory function stable Cardiovascular status: blood pressure returned to baseline and stable Postop Assessment: no headache, no backache, no apparent nausea or vomiting and patient able to bend at knees Anesthetic complications: no   No notable events documented.  Last Vitals:  Vitals:   12/19/21 1127 12/19/21 1228  BP: 123/62 126/64  Pulse: (!) 53   Resp: 14 16  Temp: 36.4 C 36.6 C  SpO2: 95% 94%    Last Pain:  Vitals:   12/19/21 1228  TempSrc:   PainSc: 0-No pain                 Barnet Glasgow

## 2021-12-19 NOTE — Interval H&P Note (Signed)
History and Physical Interval Note:  12/19/2021 6:29 AM  [name]  has presented today for surgery, with the diagnosis of Right knee osteoarthritis.  The various methods of treatment have been discussed with the patient and family. After consideration of risks, benefits and other options for treatment, the patient has consented to  Procedure(s): TOTAL KNEE ARTHROPLASTY (Right) as a surgical intervention.  The patient's history has been reviewed, patient examined, no change in status, stable for surgery.  I have reviewed the patient's chart and labs.  Questions were answered to the patient's satisfaction.     [name]

## 2021-12-19 NOTE — Progress Notes (Signed)
Orthopedic Tech Progress Note Patient Details:  [name] [birth year] [id number]  CPM Right Knee CPM Right Knee: On Right Knee Flexion (Degrees): 40 Right Knee Extension (Degrees): 10  Post Interventions Patient Tolerated: Well Instructions Provided: Care of device, Adjustment of device  [place] Pink 12/19/2021, 10:09 AM

## 2021-12-19 NOTE — Plan of Care (Signed)
Plan of care reviewed and discussed with the patient and her daughter. 

## 2021-12-19 NOTE — Discharge Instructions (Signed)
 Frank Aluisio, MD Total Joint Specialist [facility] [place] [address] [place], [place] [address] [phone]  TOTAL KNEE REPLACEMENT POSTOPERATIVE DIRECTIONS    Knee Rehabilitation, Guidelines Following Surgery  Results after knee surgery are often greatly improved when you follow the exercise, range of motion and muscle strengthening exercises prescribed by your doctor. Safety measures are also important to protect the knee from further injury. If any of these exercises cause you to have increased pain or swelling in your knee joint, decrease the amount until you are comfortable again and slowly increase them. If you have problems or questions, call your caregiver or physical therapist for advice.   BLOOD CLOT PREVENTION Take a 325 mg Aspirin two times a day for three weeks following surgery. Then take an 81 mg Aspirin once a day for three weeks. Then discontinue Aspirin. You may resume your vitamins/supplements upon discharge from the hospital. Do not take any NSAIDs (Advil, Aleve, Ibuprofen, Meloxicam, etc.) until you have discontinued the 325 mg Aspirin.  HOME CARE INSTRUCTIONS  Remove items at home which could result in a fall. This includes throw rugs or furniture in walking pathways.  ICE to the affected knee as much as tolerated. Icing helps control swelling. If the swelling is well controlled you will be more comfortable and rehab easier. Continue to use ice on the knee for pain and swelling from surgery. You may notice swelling that will progress down to the foot and ankle. This is normal after surgery. Elevate the leg when you are not up walking on it.    Continue to use the breathing machine which will help keep your temperature down. It is common for your temperature to cycle up and down following surgery, especially at night when you are not up moving around and exerting yourself. The breathing machine keeps your lungs expanded and your temperature  down. Do not place pillow under the operative knee, focus on keeping the knee straight while resting  DIET You may resume your previous home diet once you are discharged from the hospital.  DRESSING / WOUND CARE / SHOWERING Keep your bulky bandage on for 2 days. On the third post-operative day you may remove the Ace bandage and gauze. There is a waterproof adhesive bandage on your skin which will stay in place until your first follow-up appointment. Once you remove this you will not need to place another bandage You may begin showering 3 days following surgery, but do not submerge the incision under water.  ACTIVITY For the first 5 days, the key is rest and control of pain and swelling Do your home exercises twice a day starting on post-operative day 3. On the days you go to physical therapy, just do the home exercises once that day. You should rest, ice and elevate the leg for 50 minutes out of every hour. Get up and walk/stretch for 10 minutes per hour. After 5 days you can increase your activity slowly as tolerated. Walk with your walker as instructed. Use the walker until you are comfortable transitioning to a cane. Walk with the cane in the opposite hand of the operative leg. You may discontinue the cane once you are comfortable and walking steadily. Avoid periods of inactivity such as sitting longer than an hour when not asleep. This helps prevent blood clots.  You may discontinue the knee immobilizer once you are able to perform a straight leg raise while lying down. You may resume a sexual relationship in one month   or when given the OK by your doctor.  You may return to work once you are cleared by your doctor.  Do not drive a car for 6 weeks or until released by your surgeon.  Do not drive while taking narcotics.  TED HOSE STOCKINGS Wear the elastic stockings on both legs for three weeks following surgery during the day. You may remove them at night for sleeping.  WEIGHT  BEARING Weight bearing as tolerated with assist device (walker, cane, etc) as directed, use it as long as suggested by your surgeon or therapist, typically at least 4-6 weeks.  POSTOPERATIVE CONSTIPATION PROTOCOL Constipation - defined medically as fewer than three stools per week and severe constipation as less than one stool per week.  One of the most common issues patients have following surgery is constipation.  Even if you have a regular bowel pattern at home, your normal regimen is likely to be disrupted due to multiple reasons following surgery.  Combination of anesthesia, postoperative narcotics, change in appetite and fluid intake all can affect your bowels.  In order to avoid complications following surgery, here are some recommendations in order to help you during your recovery period.  Colace (docusate) - Pick up an over-the-counter form of Colace or another stool softener and take twice a day as long as you are requiring postoperative pain medications.  Take with a full glass of water daily.  If you experience loose stools or diarrhea, hold the colace until you stool forms back up. If your symptoms do not get better within 1 week or if they get worse, check with your doctor. Dulcolax (bisacodyl) - Pick up over-the-counter and take as directed by the product packaging as needed to assist with the movement of your bowels.  Take with a full glass of water.  Use this product as needed if not relieved by Colace only.  MiraLax (polyethylene glycol) - Pick up over-the-counter to have on hand. MiraLax is a solution that will increase the amount of water in your bowels to assist with bowel movements.  Take as directed and can mix with a glass of water, juice, soda, coffee, or tea. Take if you go more than two days without a movement. Do not use MiraLax more than once per day. Call your doctor if you are still constipated or irregular after using this medication for 7 days in a row.  If you continue  to have problems with postoperative constipation, please contact the office for further assistance and recommendations.  If you experience "the worst abdominal pain ever" or develop nausea or vomiting, please contact the office immediatly for further recommendations for treatment.  ITCHING If you experience itching with your medications, try taking only a single pain pill, or even half a pain pill at a time.  You can also use Benadryl over the counter for itching or also to help with sleep.   MEDICATIONS See your medication summary on the "After Visit Summary" that the nursing staff will review with you prior to discharge.  You may have some home medications which will be placed on hold until you complete the course of blood thinner medication.  It is important for you to complete the blood thinner medication as prescribed by your surgeon.  Continue your approved medications as instructed at time of discharge.  PRECAUTIONS If you experience chest pain or shortness of breath - call 911 immediately for transfer to the hospital emergency department.  If you develop a fever greater that 101 F,   purulent drainage from wound, increased redness or drainage from wound, foul odor from the wound/dressing, or calf pain - CONTACT YOUR SURGEON.                                                   FOLLOW-UP APPOINTMENTS Make sure you keep all of your appointments after your operation with your surgeon and caregivers. You should call the office at the above phone number and make an appointment for approximately two weeks after the date of your surgery or on the date instructed by your surgeon outlined in the "After Visit Summary".  RANGE OF MOTION AND STRENGTHENING EXERCISES  Rehabilitation of the knee is important following a knee injury or an operation. After just a few days of immobilization, the muscles of the thigh which control the knee become weakened and shrink (atrophy). Knee exercises are designed to build up  the tone and strength of the thigh muscles and to improve knee motion. Often times heat used for twenty to thirty minutes before working out will loosen up your tissues and help with improving the range of motion but do not use heat for the first two weeks following surgery. These exercises can be done on a training (exercise) mat, on the floor, on a table or on a bed. Use what ever works the best and is most comfortable for you Knee exercises include:  Leg Lifts - While your knee is still immobilized in a splint or cast, you can do straight leg raises. Lift the leg to 60 degrees, hold for 3 sec, and slowly lower the leg. Repeat 10-20 times 2-3 times daily. Perform this exercise against resistance later as your knee gets better.  Quad and Hamstring Sets - Tighten up the muscle on the front of the thigh (Quad) and hold for 5-10 sec. Repeat this 10-20 times hourly. Hamstring sets are done by pushing the foot backward against an object and holding for 5-10 sec. Repeat as with quad sets.  Leg Slides: Lying on your back, slowly slide your foot toward your buttocks, bending your knee up off the floor (only go as far as is comfortable). Then slowly slide your foot back down until your leg is flat on the floor again. Angel Wings: Lying on your back spread your legs to the side as far apart as you can without causing discomfort.  A rehabilitation program following serious knee injuries can speed recovery and prevent re-injury in the future due to weakened muscles. Contact your doctor or a physical therapist for more information on knee rehabilitation.   POST-OPERATIVE OPIOID TAPER INSTRUCTIONS: It is important to wean off of your opioid medication as soon as possible. If you do not need pain medication after your surgery it is ok to stop day one. Opioids include: Codeine, Hydrocodone(Norco, Vicodin), Oxycodone(Percocet, oxycontin) and hydromorphone amongst others.  Long term and even short term use of opiods can  cause: Increased pain response Dependence Constipation Depression Respiratory depression And more.  Withdrawal symptoms can include Flu like symptoms Nausea, vomiting And more Techniques to manage these symptoms Hydrate well Eat regular healthy meals Stay active Use relaxation techniques(deep breathing, meditating, yoga) Do Not substitute Alcohol to help with tapering If you have been on opioids for less than two weeks and do not have pain than it is ok to stop all together.  Plan   to wean off of opioids This plan should start within one week post op of your joint replacement. Maintain the same interval or time between taking each dose and first decrease the dose.  Cut the total daily intake of opioids by one tablet each day Next start to increase the time between doses. The last dose that should be eliminated is the evening dose.   IF YOU ARE TRANSFERRED TO A SKILLED REHAB FACILITY If the patient is transferred to a skilled rehab facility following release from the hospital, a list of the current medications will be sent to the facility for the patient to continue.  When discharged from the skilled rehab facility, please have the facility set up the patient's Home Health Physical Therapy prior to being released. Also, the skilled facility will be responsible for providing the patient with their medications at time of release from the facility to include their pain medication, the muscle relaxants, and their blood thinner medication. If the patient is still at the rehab facility at time of the two week follow up appointment, the skilled rehab facility will also need to assist the patient in arranging follow up appointment in our office and any transportation needs.  MAKE SURE YOU:  Understand these instructions.  Get help right away if you are not doing well or get worse.   DENTAL ANTIBIOTICS:  In most cases prophylactic antibiotics for Dental procdeures after total joint surgery are  not necessary.  Exceptions are as follows:  1. History of prior total joint infection  2. Severely immunocompromised (Organ Transplant, cancer chemotherapy, Rheumatoid biologic medications such as Humera)  3. Poorly controlled diabetes (A1C &gt; 8.0, blood glucose over 200)  If you have one of these conditions, contact your surgeon for an antibiotic prescription, prior to your dental procedure.    Pick up stool softner and laxative for home use following surgery while on pain medications. Do not submerge incision under water. Please use good hand washing techniques while changing dressing each day. May shower starting three days after surgery. Please use a clean towel to pat the incision dry following showers. Continue to use ice for pain and swelling after surgery. Do not use any lotions or creams on the incision until instructed by your surgeon.  

## 2021-12-19 NOTE — Anesthesia Procedure Notes (Signed)
Spinal  Patient location during procedure: OB Start time: 12/19/2021 8:37 AM End time: 12/19/2021 8:44 AM Reason for block: surgical anesthesia Staffing Performed: anesthesiologist  Anesthesiologist: Barnet Glasgow, MD Performed by: Barnet Glasgow, MD Authorized by: Barnet Glasgow, MD   Preanesthetic Checklist Completed: patient identified, IV checked, risks and benefits discussed, surgical consent, monitors and equipment checked, pre-op evaluation and timeout performed Spinal Block Patient position: sitting Prep: DuraPrep and site prepped and draped Patient monitoring: heart rate, cardiac monitor, continuous pulse ox and blood pressure Approach: midline Location: L2-3 Injection technique: single-shot Needle Needle type: Pencan  Needle gauge: 24 G Needle length: 10 cm Needle insertion depth: 6 cm Assessment Sensory level: T4 Events: CSF return Additional Notes  1 Attempt (s). Pt tolerated procedure well.

## 2021-12-19 NOTE — Transfer of Care (Signed)
Immediate Anesthesia Transfer of Care Note  Patient: DARIEN MIGNOGNA  Procedure(s) Performed: TOTAL KNEE ARTHROPLASTY (Right: Knee)  Patient Location: PACU  Anesthesia Type:Spinal  Level of Consciousness: awake, alert , oriented and patient cooperative  Airway & Oxygen Therapy: Patient Spontanous Breathing and Patient connected to face mask oxygen  Post-op Assessment: Report given to RN and Post -op Vital signs reviewed and stable  Post vital signs: Reviewed and stable  Last Vitals:  Vitals Value Taken Time  BP 121/57 12/19/21 1008  Temp    Pulse 65 12/19/21 1010  Resp 14 12/19/21 1010  SpO2 99 % 12/19/21 1010  Vitals shown include unvalidated device data.  Last Pain:  Vitals:   12/19/21 0641  PainSc: 0-No pain      Patients Stated Pain Goal: 3 (63/78/58 8502)  Complications: No notable events documented.

## 2021-12-19 NOTE — Care Plan (Signed)
Ortho Bundle Case Management Note  Patient Details  Name: [name] MRN: [id number] Date of Birth: January 08, 1941                  R TKA on 12-19-21 DCP: Home with dtr DME: No needs, has a RW PT: EO on 12-22-21   DME Arranged:  N/A DME Agency:     HH Arranged:    [place] Agency:     Additional Comments: Please contact me with any questions of if this plan should need to change.  Marianne Sofia, RN,CCM [facility]  [phone] 12/19/2021, 1:35 PM

## 2021-12-19 NOTE — Op Note (Signed)
OPERATIVE REPORT-TOTAL KNEE ARTHROPLASTY   Pre-operative diagnosis- Osteoarthritis  Right knee(s)  Post-operative diagnosis- Osteoarthritis Right knee(s)  Procedure-  Right  Total Knee Arthroplasty  Surgeon- Dione Plover. Bobbe Quilter, MD  Assistant- Molli Barrows, PA-C   Anesthesia-  Spinal  EBL-20 mL   Drains None  Tourniquet time-  Total Tourniquet Time Documented: area (Right) - 34 minutes Total: area (Right) - 34 minutes     Complications- None  Condition-PACU - hemodynamically stable.   Brief Clinical Note  [name] is a 81 y.o. year old female with end stage OA of her right knee with progressively worsening pain and dysfunction. She has constant pain, with activity and at rest and significant functional deficits with difficulties even with ADLs. She has had extensive non-op management including analgesics, injections of cortisone and viscosupplements, and home exercise program, but remains in significant pain with significant dysfunction.Radiographs show bone on bone arthritis medial and patellofemoral. She presents now for right Total Knee Arthroplasty.     Procedure in detail---   The patient is brought into the operating room and positioned supine on the operating table. After successful administration of   adductor canal block and spinal ,   a tourniquet is placed high on the  Right thigh(s) and the lower extremity is prepped and draped in the usual sterile fashion. Time out is performed by the operating team and then the  Right lower extremity is wrapped in Esmarch, knee flexed and the tourniquet inflated to 300 mmHg.       A midline incision is made with a ten blade through the subcutaneous tissue to the level of the extensor mechanism. A fresh blade is used to make a medial parapatellar arthrotomy. Soft tissue over the proximal medial tibia is subperiosteally elevated to the joint line with a knife and into the semimembranosus bursa with a Cobb elevator. Soft tissue over  the proximal lateral tibia is elevated with attention being paid to avoiding the patellar tendon on the tibial tubercle. The patella is everted, knee flexed 90 degrees and the ACL and PCL are removed. Findings are bone on bone medial and patellofemoral with large global osteophytes        The drill is used to create a starting hole in the distal femur and the canal is thoroughly irrigated with sterile saline to remove the fatty contents. The 5 degree Right  valgus alignment guide is placed into the femoral canal and the distal femoral cutting block is pinned to remove 10 mm off the distal femur. Resection is made with an oscillating saw.      The tibia is subluxed forward and the menisci are removed. The extramedullary alignment guide is placed referencing proximally at the medial aspect of the tibial tubercle and distally along the second metatarsal axis and tibial crest. The block is pinned to remove 56m off the more deficient medial  side. Resection is made with an oscillating saw. Size 3is the most appropriate size for the tibia and the proximal tibia is prepared with the modular drill and keel punch for that size.      The femoral sizing guide is placed and size 3 is most appropriate. Rotation is marked off the epicondylar axis and confirmed by creating a rectangular flexion gap at 90 degrees. The size 3 cutting block is pinned in this rotation and the anterior, posterior and chamfer cuts are made with the oscillating saw. The intercondylar block is then placed and that cut is made.  Trial size 3 tibial component, trial size 3 posterior stabilized femur and a 12.5  mm posterior stabilized rotating platform insert trial is placed. Full extension is achieved with excellent varus/valgus and anterior/posterior balance throughout full range of motion. The patella is everted and thickness measured to be 22  mm. Free hand resection is taken to 12 mm, a 35 template is placed, lug holes are drilled, trial  patella is placed, and it tracks normally. Osteophytes are removed off the posterior femur with the trial in place. All trials are removed and the cut bone surfaces prepared with pulsatile lavage. Cement is mixed and once ready for implantation, the size 3 tibial implant, size  3 posterior stabilized femoral component, and the size 35 patella are cemented in place and the patella is held with the clamp. The trial insert is placed and the knee held in full extension. The Exparel (20 ml mixed with 60 ml saline) is injected into the extensor mechanism, posterior capsule, medial and lateral gutters and subcutaneous tissues.  All extruded cement is removed and once the cement is hard the permanent 12.5 mm posterior stabilized rotating platform insert is placed into the tibial tray.      The wound is copiously irrigated with saline solution and the extensor mechanism closed with # 0 Stratofix suture. The tourniquet is released for a total tourniquet time of 34  minutes. Flexion against gravity is 140 degrees and the patella tracks normally. Subcutaneous tissue is closed with 2.0 vicryl and subcuticular with running 4.0 Monocryl. The incision is cleaned and dried and steri-strips and a bulky sterile dressing are applied. The limb is placed into a knee immobilizer and the patient is awakened and transported to recovery in stable condition.      Please note that a surgical assistant was a medical necessity for this procedure in order to perform it in a safe and expeditious manner. Surgical assistant was necessary to retract the ligaments and vital neurovascular structures to prevent injury to them and also necessary for proper positioning of the limb to allow for anatomic placement of the prosthesis.   Dione Plover Omega Durante, MD    12/19/2021, 9:48 AM

## 2021-12-19 NOTE — Evaluation (Signed)
Physical Therapy Evaluation Patient Details Name: [name] MRN: [id number] DOB: [birth year] Today's Date: 12/19/2021  History of Present Illness  81 yo female s/p R TKA 6/12. Hx of L TKA  Clinical Impression  On eval POD 0, pt was Min A for mobility. She took a few steps in the room with a RW. Ambulation distance limited 2* bowel incontinence. Assisted pt into recliner end of session. Will plan to follow and progress activity as tolerated.        Recommendations for follow up therapy are one component of a multi-disciplinary discharge planning process, led by the attending physician.  Recommendations may be updated based on patient status, additional functional criteria and insurance authorization.  Follow Up Recommendations Follow physician's recommendations for discharge plan and follow up therapies    Assistance Recommended at Discharge PRN  Patient can return home with the following  A little help with walking and/or transfers;A little help with bathing/dressing/bathroom;Assist for transportation;Assistance with cooking/housework;Help with stairs or ramp for entrance    Equipment Recommendations Rolling walker (2 wheels) (pt stated she has a rollator at home)  Recommendations for Other Services       Functional Status Assessment Patient has had a recent decline in their functional status and demonstrates the ability to make significant improvements in function in a reasonable and predictable amount of time.     Precautions / Restrictions Precautions Precautions: Fall;Knee Restrictions Weight Bearing Restrictions: No      Mobility  Bed Mobility Overal bed mobility: Needs Assistance Bed Mobility: Supine to Sit     Supine to sit: Supervision     General bed mobility comments: Supv for safety.    Transfers Overall transfer level: Needs assistance Equipment used: Rolling walker (2 wheels) Transfers: Sit to/from Stand Sit to Stand: Min assist           General  transfer comment: Cues for safety, technique, hand placement. Assist to rise, steady.    Ambulation/Gait Ambulation/Gait assistance: Min assist Gait Distance (Feet): 5 Feet Assistive device: Rolling walker (2 wheels) Gait Pattern/deviations: Step-to pattern       General Gait Details: Pt walked from bsc to recliner with RW. Cues for safety.  Stairs            Wheelchair Mobility    Modified Rankin (Stroke Patients Only)       Balance Overall balance assessment: Needs assistance         Standing balance support: Reliant on assistive device for balance, During functional activity Standing balance-Leahy Scale: Fair                               Pertinent Vitals/Pain Pain Assessment Pain Assessment: 0-10 Pain Score: 5  Pain Location: R knee Pain Descriptors / Indicators: Aching Pain Intervention(s): Limited activity within patient's tolerance, Monitored during session, Repositioned    Home Living Family/patient expects to be discharged to:: Private residence Living Arrangements: Alone   Type of Home: House Home Access: Level entry       Home Layout: One level Home Equipment: Conservation officer, nature (2 wheels);Grab bars - tub/shower;Grab bars - toilet      Prior Function Prior Level of Function : Independent/Modified Independent                     Hand Dominance   Dominant Hand: Right    Extremity/Trunk Assessment   Upper Extremity Assessment Upper Extremity Assessment: Overall [facility]  for tasks assessed    Lower Extremity Assessment Lower Extremity Assessment: Generalized weakness    Cervical / Trunk Assessment Cervical / Trunk Assessment: Normal  Communication   Communication: No difficulties  Cognition Arousal/Alertness: Awake/alert Behavior During Therapy: WFL for tasks assessed/performed Overall Cognitive Status: Within Functional Limits for tasks assessed                                          General  Comments      Exercises     Assessment/Plan    PT Assessment Patient needs continued PT services  PT Problem List Decreased strength;Decreased mobility;Decreased range of motion;Decreased activity tolerance;Decreased balance;Decreased knowledge of use of DME;Pain       PT Treatment Interventions DME instruction;Therapeutic activities;Gait training;Therapeutic exercise;Stair training;Functional mobility training;Patient/family education    PT Goals (Current goals can be found in the Care Plan section)  Acute Rehab PT Goals Patient Stated Goal: regain PLOF/independence PT Goal Formulation: With patient Time For Goal Achievement: 01/02/22 Potential to Achieve Goals: Good    Frequency 7X/week     Co-evaluation               AM-PAC PT "6 Clicks" Mobility  Outcome Measure Help needed turning from your back to your side while in a flat bed without using bedrails?: A Little Help needed moving from lying on your back to sitting on the side of a flat bed without using bedrails?: A Little Help needed moving to and from a bed to a chair (including a wheelchair)?: A Little Help needed standing up from a chair using your arms (e.g., wheelchair or bedside chair)?: A Little Help needed to walk in hospital room?: A Little Help needed climbing 3-5 steps with a railing? : A Little 6 Click Score: 18    End of Session Equipment Utilized During Treatment: Gait belt Activity Tolerance: Patient tolerated treatment well Patient left: in chair;with call bell/phone within reach;with chair alarm set   PT Visit Diagnosis: Other abnormalities of gait and mobility (R26.89);Pain Pain - Right/Left: Right Pain - part of body: Knee    Time: 1530-1553 PT Time Calculation (min) (ACUTE ONLY): 23 min   Charges:   PT Evaluation $PT Eval Low Complexity: 1 Low PT Treatments $Gait Training: 8-22 mins          Doreatha Massed, PT Acute Rehabilitation  Office: [phone] Pager: [phone]

## 2021-12-19 NOTE — Progress Notes (Signed)
AssistedDr. Valma Cava with right, adductor canal block. Side rails up, monitors on throughout procedure. See vital signs in flow sheet. Tolerated Procedure well.

## 2021-12-19 NOTE — Anesthesia Procedure Notes (Signed)
Anesthesia Regional Block: Adductor canal block   Pre-Anesthetic Checklist: , timeout performed,  Correct Patient, Correct Site, Correct Laterality,  Correct Procedure, Correct Position, site marked,  Risks and benefits discussed,  Surgical consent,  Pre-op evaluation,  At surgeon's request and post-op pain management  Laterality: Lower and Right  Prep: chloraprep       Needles:  Injection technique: Single-shot  Needle Type: Echogenic Needle     Needle Length: 9cm  Needle Gauge: 22     Additional Needles:   Procedures:,,,, ultrasound used (permanent image in chart),,    Narrative:  Start time: 12/19/2021 7:55 AM End time: 12/19/2021 8:00 AM Injection made incrementally with aspirations every 5 mL.  Performed by: Personally  Anesthesiologist: Barnet Glasgow, MD  Additional Notes: Block assessed prior to surgery. Pt tolerated procedure well.

## 2021-12-20 ENCOUNTER — Encounter (HOSPITAL_COMMUNITY): Payer: Self-pay | Admitting: Orthopedic Surgery

## 2021-12-20 ENCOUNTER — Other Ambulatory Visit (HOSPITAL_COMMUNITY): Payer: Self-pay

## 2021-12-20 DIAGNOSIS — M1712 Unilateral primary osteoarthritis, left knee: Secondary | ICD-10-CM | POA: Diagnosis not present

## 2021-12-20 DIAGNOSIS — M1711 Unilateral primary osteoarthritis, right knee: Secondary | ICD-10-CM | POA: Diagnosis not present

## 2021-12-20 DIAGNOSIS — Z96652 Presence of left artificial knee joint: Secondary | ICD-10-CM | POA: Diagnosis not present

## 2021-12-20 LAB — CBC
HCT: 30.2 % — ABNORMAL LOW (ref 36.0–46.0)
Hemoglobin: 10.2 g/dL — ABNORMAL LOW (ref 12.0–15.0)
MCH: 32.4 pg (ref 26.0–34.0)
MCHC: 33.8 g/dL (ref 30.0–36.0)
MCV: 95.9 fL (ref 80.0–100.0)
Platelets: 192 10*3/uL (ref 150–400)
RBC: 3.15 MIL/uL — ABNORMAL LOW (ref 3.87–5.11)
RDW: 12.8 % (ref 11.5–15.5)
WBC: 11.2 10*3/uL — ABNORMAL HIGH (ref 4.0–10.5)
nRBC: 0 % (ref 0.0–0.2)

## 2021-12-20 LAB — BASIC METABOLIC PANEL WITH GFR
Anion gap: 6 (ref 5–15)
BUN: 14 mg/dL (ref 8–23)
CO2: 24 mmol/L (ref 22–32)
Calcium: 8.7 mg/dL — ABNORMAL LOW (ref 8.9–10.3)
Chloride: 102 mmol/L (ref 98–111)
Creatinine, Ser: 1.03 mg/dL — ABNORMAL HIGH (ref 0.44–1.00)
GFR, Estimated: 54 mL/min — ABNORMAL LOW
Glucose, Bld: 135 mg/dL — ABNORMAL HIGH (ref 70–99)
Potassium: 4.6 mmol/L (ref 3.5–5.1)
Sodium: 132 mmol/L — ABNORMAL LOW (ref 135–145)

## 2021-12-20 LAB — GLUCOSE, CAPILLARY: Glucose-Capillary: 118 mg/dL — ABNORMAL HIGH (ref 70–99)

## 2021-12-20 MED ORDER — HYDROCODONE-ACETAMINOPHEN 5-325 MG PO TABS: 1.0000 | ORAL_TABLET | Freq: Four times a day (QID) | ORAL | Status: DC | PRN

## 2021-12-20 MED ORDER — ASPIRIN 325 MG PO TBEC
325.0000 mg | DELAYED_RELEASE_TABLET | Freq: Two times a day (BID) | ORAL | 0 refills | Status: AC
  Filled 2021-12-20: qty 40, 20d supply, fill #0

## 2021-12-20 MED ORDER — HYDROCODONE-ACETAMINOPHEN 5-325 MG PO TABS
1.0000 | ORAL_TABLET | Freq: Four times a day (QID) | ORAL | 0 refills | Status: DC | PRN
  Filled 2021-12-20: qty 42, 6d supply, fill #0

## 2021-12-20 MED ORDER — CYCLOBENZAPRINE HCL 10 MG PO TABS
10.0000 mg | ORAL_TABLET | Freq: Three times a day (TID) | ORAL | 0 refills | Status: DC | PRN
  Filled 2021-12-20: qty 30, 10d supply, fill #0

## 2021-12-20 MED ORDER — METHOCARBAMOL 500 MG PO TABS
500.0000 mg | ORAL_TABLET | Freq: Four times a day (QID) | ORAL | 0 refills | Status: DC | PRN
  Filled 2021-12-20: qty 40, 10d supply, fill #0

## 2021-12-20 NOTE — TOC Transition Note (Signed)
Transition of Care [facility]) - CM/SW Discharge Note  Patient Details  Name: [name] MRN: [id number] Date of Birth: 1941/04/06  [facility] CM/SW Contact:  Sherie Don, LCSW Phone Number: 12/20/2021, 9:26 AM  Clinical Narrative: Patient is expected to discharge home after working with PT. CSW met with patient to review discharge plan and needs. Patient will go to OPPT at [facility]. Patient has a rollator, grab bars, BSC, and cane at home. CSW explained patient will need a front wheel rolling walker rather than a rollator and patient is agreeable to walker through [place]. [employer] delivered walker to patient's room. TOC signing off.  Final next level of care: OP Rehab Barriers to Discharge: No Barriers Identified  Patient Goals and CMS Choice Patient states their goals for this hospitalization and ongoing recovery are:: Discharge home with OPPT at [facility] Choice offered to / list presented to : NA  Discharge Plan and Services       DME Arranged: Walker rolling DME Agency: [employer] Date DME Agency Contacted: 12/20/21 Representative spoke with at DME Agency: Wells Guiles  Readmission Risk Interventions     No data to display

## 2021-12-20 NOTE — Progress Notes (Signed)
Physical Therapy Treatment Patient Details Name: [name] MRN: [id number] DOB: [birth year] Today's Date: 12/20/2021   History of Present Illness 81 yo female s/p R TKA 6/12. Hx of L TKA    PT Comments    Pt continues to participate well. All education completed.    Recommendations for follow up therapy are one component of a multi-disciplinary discharge planning process, led by the attending physician.  Recommendations may be updated based on patient status, additional functional criteria and insurance authorization.  Follow Up Recommendations  Follow physician's recommendations for discharge plan and follow up therapies     Assistance Recommended at Discharge PRN  Patient can return home with the following A little help with walking and/or transfers;A little help with bathing/dressing/bathroom;Assist for transportation;Assistance with cooking/housework;Help with stairs or ramp for entrance   Equipment Recommendations  Rolling walker (2 wheels)    Recommendations for Other Services       Precautions / Restrictions Precautions Precautions: Fall;Knee Precaution Comments: educated on no pillow behind the surgical knee Restrictions Weight Bearing Restrictions: No     Mobility  Bed Mobility Overal bed mobility: Needs Assistance Bed Mobility: Supine to Sit     Supine to sit: Supervision Sit to supine: Supervision   General bed mobility comments: Supv for safety.    Transfers Overall transfer level: Needs assistance Equipment used: Rolling walker (2 wheels) Transfers: Sit to/from Stand Sit to Stand: Supervision           General transfer comment: Supv for safety    Ambulation/Gait Ambulation/Gait assistance: Min guard Gait Distance (Feet): 100 Feet Assistive device: Rolling walker (2 wheels) Gait Pattern/deviations: Decreased stride length, Decreased step length - right, Decreased weight shift to right       General Gait Details: Fluctuates between step  to and step thru pattern. Pt denies dizziness but reports fatigue. Cues for safety   Stairs          Wheelchair Mobility    Modified Rankin (Stroke Patients Only)       Balance Overall balance assessment: Needs assistance         Standing balance support: Bilateral upper extremity supported, During functional activity Standing balance-Leahy Scale: Fair                              Cognition Arousal/Alertness: Awake/alert Behavior During Therapy: WFL for tasks assessed/performed Overall Cognitive Status: Within Functional Limits for tasks assessed                                          Exercises     General Comments        Pertinent Vitals/Pain Pain Assessment Pain Assessment: 0-10 Pain Score: 5  Pain Location: R knee Pain Descriptors / Indicators: Discomfort, Sore, Aching Pain Intervention(s): Monitored during session    Home Living                          Prior Function            PT Goals (current goals can now be found in the care plan section) Progress towards PT goals: Progressing toward goals    Frequency    7X/week      PT Plan Current plan remains appropriate    Co-evaluation  AM-PAC PT "6 Clicks" Mobility   Outcome Measure  Help needed turning from your back to your side while in a flat bed without using bedrails?: A Little Help needed moving from lying on your back to sitting on the side of a flat bed without using bedrails?: A Little Help needed moving to and from a bed to a chair (including a wheelchair)?: A Little Help needed standing up from a chair using your arms (e.g., wheelchair or bedside chair)?: A Little Help needed to walk in hospital room?: A Little Help needed climbing 3-5 steps with a railing? : A Little 6 Click Score: 18    End of Session Equipment Utilized During Treatment: Gait belt Activity Tolerance: Patient tolerated treatment well Patient  left: in bed;with call bell/phone within reach   PT Visit Diagnosis: Other abnormalities of gait and mobility (R26.89);Pain Pain - Right/Left: Right Pain - part of body: Knee     Time: 1335-1345 PT Time Calculation (min) (ACUTE ONLY): 10 min  Charges:  $Gait Training: 8-22 mins $Therapeutic Exercise: 8-22 mins                         Doreatha Massed, PT Acute Rehabilitation  Office: [phone] Pager: [phone]

## 2021-12-20 NOTE — Progress Notes (Signed)
Patient discharged to home w/ family. Given all belongings, instructions, equipment. Verbalized understanding of instructions. Escorted to pov via w/c. 

## 2021-12-20 NOTE — Progress Notes (Signed)
Physical Therapy Treatment Patient Details Name: [name] MRN: [id number] DOB: Apr 26, 1941 Today's Date: 12/20/2021   History of Present Illness 81 yo female s/p R TKA 6/12. Hx of L TKA    PT Comments    Progressing well. Will have a 2nd session prior to d/c home later today.    Recommendations for follow up therapy are one component of a multi-disciplinary discharge planning process, led by the attending physician.  Recommendations may be updated based on patient status, additional functional criteria and insurance authorization.  Follow Up Recommendations  Follow physician's recommendations for discharge plan and follow up therapies     Assistance Recommended at Discharge PRN  Patient can return home with the following A little help with walking and/or transfers;A little help with bathing/dressing/bathroom;Assist for transportation;Assistance with cooking/housework;Help with stairs or ramp for entrance   Equipment Recommendations  Rolling walker (2 wheels)    Recommendations for Other Services       Precautions / Restrictions Precautions Precautions: Fall;Knee Precaution Comments: educated on no pillow behind the surgical knee Restrictions Weight Bearing Restrictions: No     Mobility  Bed Mobility Overal bed mobility: Needs Assistance Bed Mobility: Supine to Sit, Sit to Supine     Supine to sit: Supervision Sit to supine: Supervision   General bed mobility comments: Supv for safety.    Transfers Overall transfer level: Needs assistance Equipment used: Rolling walker (2 wheels) Transfers: Sit to/from Stand Sit to Stand: Supervision           General transfer comment: Supv for safety    Ambulation/Gait Ambulation/Gait assistance: Min guard Gait Distance (Feet): 75 Feet Assistive device: Rolling walker (2 wheels) Gait Pattern/deviations: Decreased stride length, Decreased step length - right, Decreased weight shift to right       General Gait  Details: Fluctuates between step to and step thru pattern. Pt denies dizziness but reports fatigue.   Stairs Stairs: Yes Min guard assist     Number of Stairs: 1 General stair comments: up and over portable 1-step x 1. cues for safety, technique, sequence.   Wheelchair Mobility    Modified Rankin (Stroke Patients Only)       Balance Overall balance assessment: Needs assistance         Standing balance support: During functional activity Standing balance-Leahy Scale: Fair                              Cognition Arousal/Alertness: Awake/alert Behavior During Therapy: WFL for tasks assessed/performed Overall Cognitive Status: Within Functional Limits for tasks assessed                                          Exercises Total Joint Exercises Ankle Circles/Pumps: AROM, Both, 10 reps Quad Sets: AROM, Both, 10 reps Hip ABduction/ADduction: AROM, Right, 10 reps Knee Flexion: AAROM, AROM, Right, 10 reps, Seated Goniometric ROM: ~10-65 degrees    General Comments        Pertinent Vitals/Pain Pain Assessment Pain Assessment: 0-10 Pain Score: 7  Pain Location: R knee Pain Descriptors / Indicators: Discomfort, Sore, Aching Pain Intervention(s): Limited activity within patient's tolerance, Monitored during session, Ice applied    Home Living                          Prior Function  PT Goals (current goals can now be found in the care plan section) Progress towards PT goals: Progressing toward goals    Frequency    7X/week      PT Plan Current plan remains appropriate    Co-evaluation              AM-PAC PT "6 Clicks" Mobility   Outcome Measure  Help needed turning from your back to your side while in a flat bed without using bedrails?: A Little Help needed moving from lying on your back to sitting on the side of a flat bed without using bedrails?: A Little Help needed moving to and from a bed to  a chair (including a wheelchair)?: A Little Help needed standing up from a chair using your arms (e.g., wheelchair or bedside chair)?: A Little Help needed to walk in hospital room?: A Little Help needed climbing 3-5 steps with a railing? : A Little 6 Click Score: 18    End of Session Equipment Utilized During Treatment: Gait belt Activity Tolerance: Patient tolerated treatment well Patient left: in bed;with call bell/phone within reach;with family/visitor present   PT Visit Diagnosis: Other abnormalities of gait and mobility (R26.89);Pain Pain - Right/Left: Right Pain - part of body: Knee     Time: 0950-1015 PT Time Calculation (min) (ACUTE ONLY): 25 min  Charges:  $Gait Training: 8-22 mins $Therapeutic Exercise: 8-22 mins                         Doreatha Massed, PT Acute Rehabilitation  Office: [phone] Pager: [phone]

## 2021-12-20 NOTE — Progress Notes (Signed)
Subjective: 1 Day Post-Op Procedure(s) (LRB): TOTAL KNEE ARTHROPLASTY (Right) Patient reports pain as mild.   Patient seen in rounds by Dr. Wynelle Link. Patient is well, and has had no acute complaints or problems No issues overnight. Denies chest pain, SOB, or calf pain. Foley catheter removed this AM.  We will continue therapy today, ambulated 5' yesterday.   Objective: Vital signs in last 24 hours: Temp:  [97.5 F (36.4 C)-98.4 F (36.9 C)] 98.1 F (36.7 C) (06/13 0532) Pulse Rate:  [53-74] 71 (06/13 0532) Resp:  [11-21] 18 (06/13 0532) BP: (119-129)/(53-64) 129/62 (06/13 0532) SpO2:  [94 %-100 %] 94 % (06/13 0532)  Intake/Output from previous day:  Intake/Output Summary (Last 24 hours) at 12/20/2021 0901 Last data filed at 12/20/2021 0635 Gross per 24 hour  Intake 2641.34 ml  Output 1420 ml  Net 1221.34 ml     Intake/Output this shift: No intake/output data recorded.  Labs: Recent Labs    12/20/21 0324  HGB 10.2*   Recent Labs    12/20/21 0324  WBC 11.2*  RBC 3.15*  HCT 30.2*  PLT 192   Recent Labs    12/20/21 0324  NA 132*  K 4.6  CL 102  CO2 24  BUN 14  CREATININE 1.03*  GLUCOSE 135*  CALCIUM 8.7*   No results for input(s): "LABPT", "INR" in the last 72 hours.  Exam: General - Patient is Alert and Oriented Extremity - Neurologically intact Neurovascular intact Sensation intact distally Dorsiflexion/Plantar flexion intact Dressing - dressing C/D/I Motor Function - intact, moving foot and toes well on exam.   Past Medical History:  Diagnosis Date   Anemia    BCC (basal cell carcinoma of skin) 03/27/2006   Above Left Upper Lip (MOH's)   Benign essential tremor    Chronic kidney disease    Essential hypertension, benign    Fibromyalgia    GERD (gastroesophageal reflux disease)    No longer a problem   Heart murmur    Hemorrhoids    Hyponatremia    secondary to hctz-recurrent March 2010   Low back pain    facet joint pain    Occipital neuralgia    Osteoarthritis    Osteopenia    Pneumonia    Pure hypercholesterolemia 12/16/2013   SCCA (squamous cell carcinoma) of skin 07/15/2002   Left Inner Cheek(in situ)   SCCA (squamous cell carcinoma) of skin 05/06/2004   Right Wing Nose(in situ) -Cx3,5FU   SCCA (squamous cell carcinoma) of skin 01/09/2011   Above Right Upper Lip(in situ)-Cx3   SCCA (squamous cell carcinoma) of skin 01/05/2014   Left Temple(in situ)-Cx3,5FU   SCCA (squamous cell carcinoma) of skin 09/30/2014   Right Wrist(in situ)-tx p bx   Shortness of breath dyspnea    Sleep apnea    Mild sleep apnea   Superficial basal cell carcinoma (BCC) 08/17/1982   Right Side Upper Lip   Tuberculosis    as a child    Assessment/Plan: 1 Day Post-Op Procedure(s) (LRB): TOTAL KNEE ARTHROPLASTY (Right) Principal Problem:   Osteoarthritis of right knee  Estimated body mass index is 27.54 kg/m as calculated from the following:   Height as of this encounter: '5\' 6"'$  (1.676 m).   Weight as of this encounter: 77.4 kg. Advance diet Up with therapy D/C IV fluids   Patient's anticipated LOS is less than 2 midnights, meeting these requirements: - Younger than 98 - Lives within 1 hour of care - Has a competent adult at home  to recover with post-op recover - NO history of  - Chronic pain requiring opiods  - Diabetes  - Coronary Artery Disease  - Heart failure  - Heart attack  - Stroke  - DVT/VTE  - Cardiac arrhythmia  - Respiratory Failure/COPD  - Renal failure  - Anemia  - Advanced Liver disease  DVT Prophylaxis - Aspirin Weight bearing as tolerated. Continue therapy.  Plan is to go Home after hospital stay. Plan for discharge later today if progresses with therapy and meeting goals. Scheduled for OPPT at [facility] Follow-up in the office in 2 weeks.  The PDMP database was reviewed today prior to any opioid medications being prescribed to this patient.  Theresa Duty, PA-C Orthopedic  Surgery [phone] 12/20/2021, 9:01 AM

## 2021-12-21 NOTE — Discharge Summary (Signed)
Patient ID: [name] MRN: [id number] DOB/AGE: [birth year] 82 y.o.  Admit date: 12/19/2021 Discharge date: 12/20/2021  Admission Diagnoses:  Principal Problem:   Osteoarthritis of right knee   Discharge Diagnoses:  Same  Past Medical History:  Diagnosis Date   Anemia    BCC (basal cell carcinoma of skin) 03/27/2006   Above Left Upper Lip (MOH's)   Benign essential tremor    Chronic kidney disease    Essential hypertension, benign    Fibromyalgia    GERD (gastroesophageal reflux disease)    No longer a problem   Heart murmur    Hemorrhoids    Hyponatremia    secondary to hctz-recurrent March 2010   Low back pain    facet joint pain   Occipital neuralgia    Osteoarthritis    Osteopenia    Pneumonia    Pure hypercholesterolemia 12/16/2013   SCCA (squamous cell carcinoma) of skin 07/15/2002   Left Inner Cheek(in situ)   SCCA (squamous cell carcinoma) of skin 05/06/2004   Right Wing Nose(in situ) -Cx3,5FU   SCCA (squamous cell carcinoma) of skin 01/09/2011   Above Right Upper Lip(in situ)-Cx3   SCCA (squamous cell carcinoma) of skin 01/05/2014   Left Temple(in situ)-Cx3,5FU   SCCA (squamous cell carcinoma) of skin 09/30/2014   Right Wrist(in situ)-tx p bx   Shortness of breath dyspnea    Sleep apnea    Mild sleep apnea   Superficial basal cell carcinoma (BCC) 08/17/1982   Right Side Upper Lip   Tuberculosis    as a child    Surgeries: Procedure(s): TOTAL KNEE ARTHROPLASTY on 12/19/2021   Consultants:   Discharged Condition: Improved  Hospital Course: [name] is an 81 y.o. female who was admitted 12/19/2021 for operative treatment ofOsteoarthritis of right knee. Patient has severe unremitting pain that affects sleep, daily activities, and work/hobbies. After pre-op clearance the patient was taken to the operating room on 12/19/2021 and underwent  Procedure(s): TOTAL KNEE ARTHROPLASTY.    Patient was given perioperative antibiotics:  Anti-infectives (From  admission, onward)    Start     Dose/Rate Route Frequency Ordered Stop   12/19/21 1400  ceFAZolin (ANCEF) IVPB 2g/100 mL premix        2 g 200 mL/hr over 30 Minutes Intravenous Every 6 hours 12/19/21 1134 12/19/21 2033   12/19/21 0630  ceFAZolin (ANCEF) IVPB 2g/100 mL premix        2 g 200 mL/hr over 30 Minutes Intravenous On call to O.R. 12/19/21 0622 12/19/21 0901        Patient was given sequential compression devices, early ambulation, and chemoprophylaxis to prevent DVT.  Patient benefited maximally from hospital stay and there were no complications.    Recent vital signs: No data found.   Recent laboratory studies:  Recent Labs    12/20/21 0324  WBC 11.2*  HGB 10.2*  HCT 30.2*  PLT 192  NA 132*  K 4.6  CL 102  CO2 24  BUN 14  CREATININE 1.03*  GLUCOSE 135*  CALCIUM 8.7*     Discharge Medications:   Allergies as of 12/20/2021       Reactions   Propoxyphene Other (See Comments)   Erythromycin Base Other (See Comments)   Hctz [hydrochlorothiazide] Other (See Comments)   Hyponatremia    Lisinopril Other (See Comments)   Hyponatremia   Sulfa Antibiotics Rash        Medication List     STOP taking these medications  diclofenac Sodium 1 % Gel Commonly known as: VOLTAREN       TAKE these medications    amLODipine 5 MG tablet Commonly known as: NORVASC TAKE ONE TABLET BY MOUTH DAILY   aspirin EC 325 MG tablet Take 1 tablet (325 mg total) by mouth 2 (two) times daily for 20 days. Then take one 81 mg aspirin once a day for three weeks. Then discontinue aspirin.   atenolol 25 MG tablet Commonly known as: TENORMIN TAKE ONE TABLET BY MOUTH DAILY   cetirizine 10 MG tablet Commonly known as: ZYRTEC Take 10 mg by mouth daily.   cyclobenzaprine 10 MG tablet Commonly known as: FLEXERIL Take 1 tablet (10 mg total) by mouth 3 (three) times daily as needed for muscle spasms.   furosemide 20 MG tablet Commonly known as: LASIX Take 20 mg by mouth  daily.   HYDROcodone-acetaminophen 5-325 MG tablet Commonly known as: NORCO/VICODIN Take 1-2 tablets by mouth every 6 (six) hours as needed for severe pain.   hydroxypropyl methylcellulose / hypromellose 2.5 % ophthalmic solution Commonly known as: ISOPTO TEARS / GONIOVISC Place 1 drop into both eyes as needed for dry eyes.   pregabalin 75 MG capsule Commonly known as: LYRICA Take 75 mg by mouth daily.   primidone 50 MG tablet Commonly known as: MYSOLINE Take 1 tablet (50 mg total) by mouth 2 (two) times daily.   simvastatin 40 MG tablet Commonly known as: ZOCOR Take 1 tablet (40 mg total) by mouth at bedtime.   sodium chloride 0.65 % Soln nasal spray Commonly known as: OCEAN Place 1 spray into both nostrils as needed for congestion.   traMADol 50 MG tablet Commonly known as: ULTRAM Take 2 tablets (100 mg total) by mouth every 12 (twelve) hours as needed.   traZODone 50 MG tablet Commonly known as: DESYREL TAKE ONE TABLET BY MOUTH EVERY NIGHT AT BEDTIME AS NEEDED FOR SLEEP What changed: See the new instructions.   vitamin B-12 500 MCG tablet Commonly known as: CYANOCOBALAMIN Take 1 tablet (500 mcg total) by mouth daily.   Vitamin D (Ergocalciferol) 1.25 MG (50000 UNIT) Caps capsule Commonly known as: DRISDOL Take 50,000 Units by mouth once a week.               Discharge Care Instructions  (From admission, onward)           Start     Ordered   12/20/21 0000  Weight bearing as tolerated        12/20/21 0907   12/20/21 0000  Change dressing       Comments: You may remove the bulky bandage (ACE wrap and gauze) two days after surgery. You will have an adhesive waterproof bandage underneath. Leave this in place until your first follow-up appointment.   12/20/21 0907            Diagnostic Studies: No results found.  Disposition: Discharge disposition: 01-Home or Self Care       Discharge Instructions     Call MD / Call 911   Complete by: As  directed    If you experience chest pain or shortness of breath, CALL 911 and be transported to the hospital emergency room.  If you develope a fever above 101 F, pus (white drainage) or increased drainage or redness at the wound, or calf pain, call your surgeon's office.   Change dressing   Complete by: As directed    You may remove the bulky bandage (ACE wrap and  gauze) two days after surgery. You will have an adhesive waterproof bandage underneath. Leave this in place until your first follow-up appointment.   Constipation Prevention   Complete by: As directed    Drink plenty of fluids.  Prune juice may be helpful.  You may use a stool softener, such as Colace (over the counter) 100 mg twice a day.  Use MiraLax (over the counter) for constipation as needed.   Diet - low sodium heart healthy   Complete by: As directed    Do not put a pillow under the knee. Place it under the heel.   Complete by: As directed    Driving restrictions   Complete by: As directed    No driving for two weeks   Post-operative opioid taper instructions:   Complete by: As directed    POST-OPERATIVE OPIOID TAPER INSTRUCTIONS: It is important to wean off of your opioid medication as soon as possible. If you do not need pain medication after your surgery it is ok to stop day one. Opioids include: Codeine, Hydrocodone(Norco, Vicodin), Oxycodone(Percocet, oxycontin) and hydromorphone amongst others.  Long term and even short term use of opiods can cause: Increased pain response Dependence Constipation Depression Respiratory depression And more.  Withdrawal symptoms can include Flu like symptoms Nausea, vomiting And more Techniques to manage these symptoms Hydrate well Eat regular healthy meals Stay active Use relaxation techniques(deep breathing, meditating, yoga) Do Not substitute Alcohol to help with tapering If you have been on opioids for less than two weeks and do not have pain than it is ok to stop all  together.  Plan to wean off of opioids This plan should start within one week post op of your joint replacement. Maintain the same interval or time between taking each dose and first decrease the dose.  Cut the total daily intake of opioids by one tablet each day Next start to increase the time between doses. The last dose that should be eliminated is the evening dose.      TED hose   Complete by: As directed    Use stockings (TED hose) for three weeks on both leg(s).  You may remove them at night for sleeping.   Weight bearing as tolerated   Complete by: As directed         Follow-up Information     Gaynelle Arabian, MD. Go on 01/03/2022.   Specialty: Orthopedic Surgery Why: You are scheduled for first post op appointment on Tuesday June 27th at 1:00pm. Contact information: [address] [place] [address] [phone]                  Signed: Theresa Duty 12/21/2021, 2:49 PM

## 2021-12-22 ENCOUNTER — Other Ambulatory Visit (HOSPITAL_COMMUNITY): Payer: Self-pay

## 2021-12-22 DIAGNOSIS — M25561 Pain in right knee: Secondary | ICD-10-CM | POA: Diagnosis not present

## 2021-12-26 DIAGNOSIS — M25561 Pain in right knee: Secondary | ICD-10-CM | POA: Diagnosis not present

## 2021-12-28 DIAGNOSIS — M25561 Pain in right knee: Secondary | ICD-10-CM | POA: Diagnosis not present

## 2021-12-29 ENCOUNTER — Ambulatory Visit: Payer: PPO | Admitting: Adult Health

## 2022-01-02 DIAGNOSIS — M25561 Pain in right knee: Secondary | ICD-10-CM | POA: Diagnosis not present

## 2022-01-05 DIAGNOSIS — M25561 Pain in right knee: Secondary | ICD-10-CM | POA: Diagnosis not present

## 2022-01-09 DIAGNOSIS — M25561 Pain in right knee: Secondary | ICD-10-CM | POA: Diagnosis not present

## 2022-01-12 ENCOUNTER — Ambulatory Visit: Payer: PPO | Admitting: Physician Assistant

## 2022-01-12 DIAGNOSIS — M25561 Pain in right knee: Secondary | ICD-10-CM | POA: Diagnosis not present

## 2022-01-16 DIAGNOSIS — M25561 Pain in right knee: Secondary | ICD-10-CM | POA: Diagnosis not present

## 2022-01-19 DIAGNOSIS — M25561 Pain in right knee: Secondary | ICD-10-CM | POA: Diagnosis not present

## 2022-01-24 DIAGNOSIS — Z5189 Encounter for other specified aftercare: Secondary | ICD-10-CM | POA: Diagnosis not present

## 2022-01-27 ENCOUNTER — Other Ambulatory Visit: Payer: Self-pay | Admitting: Family Medicine

## 2022-01-27 DIAGNOSIS — Z1231 Encounter for screening mammogram for malignant neoplasm of breast: Secondary | ICD-10-CM

## 2022-01-30 DIAGNOSIS — N3000 Acute cystitis without hematuria: Secondary | ICD-10-CM | POA: Diagnosis not present

## 2022-01-30 DIAGNOSIS — R300 Dysuria: Secondary | ICD-10-CM | POA: Diagnosis not present

## 2022-02-06 ENCOUNTER — Other Ambulatory Visit: Payer: Self-pay | Admitting: Neurology

## 2022-02-15 ENCOUNTER — Other Ambulatory Visit: Payer: Self-pay | Admitting: Neurology

## 2022-02-17 ENCOUNTER — Ambulatory Visit
Admission: RE | Admit: 2022-02-17 | Discharge: 2022-02-17 | Disposition: A | Discharge status: Home / Self Care | Payer: PPO | Source: Ambulatory Visit | Attending: Family Medicine | Admitting: Family Medicine

## 2022-02-17 ENCOUNTER — Other Ambulatory Visit: Payer: Self-pay | Admitting: Neurology

## 2022-02-17 DIAGNOSIS — Z1231 Encounter for screening mammogram for malignant neoplasm of breast: Secondary | ICD-10-CM

## 2022-02-20 ENCOUNTER — Telehealth: Payer: Self-pay | Admitting: Neurology

## 2022-02-20 MED ORDER — PRIMIDONE 50 MG PO TABS: 50.0000 mg | ORAL_TABLET | Freq: Two times a day (BID) | ORAL | 0 refills | Status: DC

## 2022-02-20 MED ORDER — TRAZODONE HCL 50 MG PO TABS: 50.0000 mg | ORAL_TABLET | Freq: Every evening | ORAL | 0 refills | Status: DC | PRN

## 2022-02-20 NOTE — Telephone Encounter (Signed)
Pt called needing a refill request for her  primidone (MYSOLINE) 50 MG tablet and her  traZODone (DESYREL) 50 MG tablet sent to the [facility] on [address].  Pt has r/s her f/u.

## 2022-02-20 NOTE — Telephone Encounter (Signed)
Refills have been sent for the pt for one month supply. Pt has upcoming apt 02/22/22 and will need to keep the apt for future refills

## 2022-02-22 ENCOUNTER — Ambulatory Visit: Payer: PPO | Admitting: Adult Health

## 2022-02-22 ENCOUNTER — Encounter: Payer: Self-pay | Admitting: Adult Health

## 2022-02-22 VITALS — BP 126/70 | HR 70 | Ht 65.5 in | Wt 166.8 lb

## 2022-02-22 DIAGNOSIS — F5101 Primary insomnia: Secondary | ICD-10-CM | POA: Diagnosis not present

## 2022-02-22 DIAGNOSIS — G4733 Obstructive sleep apnea (adult) (pediatric): Secondary | ICD-10-CM

## 2022-02-22 DIAGNOSIS — G250 Essential tremor: Secondary | ICD-10-CM | POA: Diagnosis not present

## 2022-02-22 NOTE — Patient Instructions (Signed)
Your Plan:  Continue Trazodone Increase Primidone to 1 tablet in the AM and PM and 1/2 tab at noon. If this works well please call and we will adjust your prescription.  If your symptoms worsen or you develop new symptoms please let us know.   Thank you for coming to see [place] at [facility]. I hope we have been able to provide you high quality care today.  You may receive a patient satisfaction survey over the next few weeks. We would appreciate your feedback and comments so that we may continue to improve ourselves and the health of our patients.

## 2022-02-22 NOTE — Progress Notes (Signed)
PATIENT: [name] DOB: April 04, 1941  REASON FOR VISIT: follow up HISTORY FROM: patient PRIMARY NEUROLOGIST: Dr. Brett Fairy  Chief Complaint  Patient presents with   Follow-up    Rm 9, alone.   Essential tremor. Is better taking primidone.  Wanting to see about starting sleep process again.  Had SS about 3 yrs ago, was not called about machine pt states.  I looked and saw that [facility].  Received an email 04/28/2021 from [facility] that the pt was never set up   "we have made several attempts to get patient scheduled for PAP setup without success. Voiding order."       HISTORY OF PRESENT ILLNESS: Today 02/22/22:  [name] is an 81 year old female with a history of essential tremor, insomnia and obstructive sleep apnea.  She returns today for follow-up.  Essential tremor: Primidone has helped. Still notices the tremor with handwriting.  Reports that she is now not as reluctant to eat in public since the tremor is better.  She is wondering if a slight increase in the dose will give her further benefit.  Insomnia: using trazodone and that has been helpful.   Obstructive sleep apnea: Reports that she was never called to get her machine.  However per notes from [facility] they did try to reach out to the patient?  Nevertheless the patient would like to get started on CPAP therapy  HISTORY   REVIEW OF SYSTEMS: Out of a complete 14 system review of symptoms, the patient complains only of the following symptoms, and all other reviewed systems are negative.  ALLERGIES: Allergies  Allergen Reactions   Propoxyphene Other (See Comments)   Erythromycin Base Other (See Comments)   Hctz [Hydrochlorothiazide] Other (See Comments)    Hyponatremia    Lisinopril Other (See Comments)    Hyponatremia   Sulfa Antibiotics Rash    HOME MEDICATIONS: Outpatient Medications Prior to Visit  Medication Sig Dispense Refill   amLODipine (NORVASC) 5 MG tablet TAKE ONE TABLET BY MOUTH DAILY 90 tablet 2    atenolol (TENORMIN) 25 MG tablet TAKE ONE TABLET BY MOUTH DAILY 90 tablet 1   cetirizine (ZYRTEC) 10 MG tablet Take 10 mg by mouth daily.     cyclobenzaprine (FLEXERIL) 10 MG tablet Take 1 tablet (10 mg total) by mouth 3 (three) times daily as needed for muscle spasms. 30 tablet 0   furosemide (LASIX) 20 MG tablet Take 20 mg by mouth daily.     HYDROcodone-acetaminophen (NORCO/VICODIN) 5-325 MG tablet Take 1-2 tablets by mouth every 6 (six) hours as needed for severe pain. 42 tablet 0   hydroxypropyl methylcellulose / hypromellose (ISOPTO TEARS / GONIOVISC) 2.5 % ophthalmic solution Place 1 drop into both eyes as needed for dry eyes.     pregabalin (LYRICA) 75 MG capsule Take 75 mg by mouth daily.     primidone (MYSOLINE) 50 MG tablet Take 1 tablet (50 mg total) by mouth 2 (two) times daily. 60 tablet 0   simvastatin (ZOCOR) 40 MG tablet Take 1 tablet (40 mg total) by mouth at bedtime. 90 tablet 2   sodium chloride (OCEAN) 0.65 % SOLN nasal spray Place 1 spray into both nostrils as needed for congestion.     traMADol (ULTRAM) 50 MG tablet Take 2 tablets (100 mg total) by mouth every 12 (twelve) hours as needed. 10 tablet 0   traZODone (DESYREL) 50 MG tablet Take 1 tablet (50 mg total) by mouth at bedtime as needed for sleep. 30 tablet  0   Vitamin D, Ergocalciferol, (DRISDOL) 1.25 MG (50000 UNIT) CAPS capsule Take 50,000 Units by mouth once a week.     vitamin B-12 (CYANOCOBALAMIN) 500 MCG tablet Take 1 tablet (500 mcg total) by mouth daily. (Patient not taking: Reported on 02/22/2022)     No facility-administered medications prior to visit.    PAST MEDICAL HISTORY: Past Medical History:  Diagnosis Date   Anemia    BCC (basal cell carcinoma of skin) 03/27/2006   Above Left Upper Lip (MOH's)   Benign essential tremor    Chronic kidney disease    Essential hypertension, benign    Fibromyalgia    GERD (gastroesophageal reflux disease)    No longer a problem   Heart murmur    Hemorrhoids     Hyponatremia    secondary to hctz-recurrent March 2010   Low back pain    facet joint pain   Occipital neuralgia    Osteoarthritis    Osteopenia    Pneumonia    Pure hypercholesterolemia 12/16/2013   SCCA (squamous cell carcinoma) of skin 07/15/2002   Left Inner Cheek(in situ)   SCCA (squamous cell carcinoma) of skin 05/06/2004   Right Wing Nose(in situ) -Cx3,5FU   SCCA (squamous cell carcinoma) of skin 01/09/2011   Above Right Upper Lip(in situ)-Cx3   SCCA (squamous cell carcinoma) of skin 01/05/2014   Left Temple(in situ)-Cx3,5FU   SCCA (squamous cell carcinoma) of skin 09/30/2014   Right Wrist(in situ)-tx p bx   Shortness of breath dyspnea    Sleep apnea    Mild sleep apnea   Superficial basal cell carcinoma (BCC) 08/17/1982   Right Side Upper Lip   Tuberculosis    as a child    PAST SURGICAL HISTORY: Past Surgical History:  Procedure Laterality Date   TOTAL KNEE ARTHROPLASTY Left 11/08/2015   Procedure: LEFT TOTAL KNEE ARTHROPLASTY;  Surgeon: Gaynelle Arabian, MD;  Location: [facility];  Service: Orthopedics;  Laterality: Left;   TOTAL KNEE ARTHROPLASTY Right 12/19/2021   Procedure: TOTAL KNEE ARTHROPLASTY;  Surgeon: Gaynelle Arabian, MD;  Location: [facility];  Service: Orthopedics;  Laterality: Right;   TUBAL LIGATION      FAMILY HISTORY: Family History  Problem Relation Age of Onset   Heart disease Father    Prostate cancer Father    Heart disease Mother    Cancer - Ovarian Mother    Congestive Heart Failure Mother    Stroke Sister     SOCIAL HISTORY: Social History   Socioeconomic History   Marital status: Divorced    Spouse name: Not on file   Number of children: 2   Years of education: Masters   Highest education level: Not on file  Occupational History   Occupation: Retired  Tobacco Use   Smoking status: Never   Smokeless tobacco: Never  Vaping Use   Vaping Use: Never used  Substance and Sexual Activity   Alcohol use: Yes    Alcohol/week: 7.0 standard  drinks of alcohol    Types: 7 Standard drinks or equivalent per week    Comment: Cocktail daily   Drug use: No   Sexual activity: Not on file  Other Topics Concern   Not on file  Social History Narrative   Denies caffeine use    Social Determinants of Health   Financial Resource Strain: Not on file  Food Insecurity: Not on file  Transportation Needs: Not on file  Physical Activity: Not on file  Stress: Not on file  Social  Connections: Not on file  Intimate Partner Violence: Not on file      PHYSICAL EXAM  Vitals:   02/22/22 1057  BP: 126/70  Pulse: 70  Weight: 166 lb 12.8 oz (75.7 kg)  Height: 5' 5.5" (1.664 m)   Body mass index is 27.33 kg/m.  Generalized: Well developed, in no acute distress   Neurological examination  Mentation: Alert oriented to time, place, history taking. Follows all commands speech and language fluent Cranial nerve II-XII: Pupils were equal round reactive to light. Extraocular movements were full, visual field were full on confrontational test. Facial sensation and strength were normal. Uvula tongue midline. Head turning and shoulder shrug  were normal and symmetric.  Neck circumference 14 inches, Mallampati 1+ Motor: The motor testing reveals 5 over 5 strength of all 4 extremities. Good symmetric motor tone is noted throughout.  Recent knee replacement on the right Sensory: Sensory testing is intact to soft touch on all 4 extremities. No evidence of extinction is noted.  Coordination: Cerebellar testing reveals good finger-nose-finger and heel-to-shin bilaterally.    DIAGNOSTIC DATA (LABS, IMAGING, TESTING) - I reviewed patient records, labs, notes, testing and imaging myself where available.  Lab Results  Component Value Date   WBC 11.2 (H) 12/20/2021   HGB 10.2 (L) 12/20/2021   HCT 30.2 (L) 12/20/2021   MCV 95.9 12/20/2021   PLT 192 12/20/2021      Component Value Date/Time   NA 132 (L) 12/20/2021 0324   K 4.6 12/20/2021 0324   CL  102 12/20/2021 0324   CO2 24 12/20/2021 0324   GLUCOSE 135 (H) 12/20/2021 0324   BUN 14 12/20/2021 0324   CREATININE 1.03 (H) 12/20/2021 0324   CREATININE 1.10 (H) 05/03/2016 1417   CALCIUM 8.7 (L) 12/20/2021 0324   PROT 7.4 12/13/2021 1129   ALBUMIN 4.2 12/13/2021 1129   AST 24 12/13/2021 1129   ALT 16 12/13/2021 1129   ALKPHOS 92 12/13/2021 1129   BILITOT 0.8 12/13/2021 1129   GFRNONAA 54 (L) 12/20/2021 0324   GFRNONAA 49 (L) 05/03/2016 1417   GFRAA 56 (L) 05/03/2016 1417   No results found for: "CHOL", "HDL", "LDLCALC", "LDLDIRECT", "TRIG", "CHOLHDL" No results found for: "HGBA1C" Lab Results  Component Value Date   VITAMINB12 198 01/24/2021   Lab Results  Component Value Date   TSH 0.793 01/22/2021      ASSESSMENT AND PLAN 81 y.o. year old female  has a past medical history of Anemia, BCC (basal cell carcinoma of skin) (03/27/2006), Benign essential tremor, Chronic kidney disease, Essential hypertension, benign, Fibromyalgia, GERD (gastroesophageal reflux disease), Heart murmur, Hemorrhoids, Hyponatremia, Low back pain, Occipital neuralgia, Osteoarthritis, Osteopenia, Pneumonia, Pure hypercholesterolemia (12/16/2013), SCCA (squamous cell carcinoma) of skin (07/15/2002), SCCA (squamous cell carcinoma) of skin (05/06/2004), SCCA (squamous cell carcinoma) of skin (01/09/2011), SCCA (squamous cell carcinoma) of skin (01/05/2014), SCCA (squamous cell carcinoma) of skin (09/30/2014), Shortness of breath dyspnea, Sleep apnea, Superficial basal cell carcinoma (BCC) (08/17/1982), and Tuberculosis. here with:  1.  Essential tremor  -Increase primidone to 1 tablet in the a.m. and p.m. and 0.5 tablet at lunch.  She will try this on a trial basis.  If she finds it beneficial she will let us know and we will adjust her prescription.  2. Insomnia   -Continue trazodone 50 mg at bedtime  3.  Obstructive sleep apnea  -Would like to try a CPAP machine since has been a year we will start  the process over. -Home sleep test has  been ordered   Follow-up after home sleep test     Ward Givens, MSN, NP-C 02/22/2022, 11:21 AM [facility] [address] [place], [place] [address] [phone]

## 2022-02-23 ENCOUNTER — Telehealth: Payer: Self-pay | Admitting: Adult Health

## 2022-02-23 NOTE — Telephone Encounter (Signed)
HTA pending faxed notes 

## 2022-03-02 NOTE — Telephone Encounter (Signed)
HTA Josem Kaufmann: [id number] (exp. 02/23/22 to 05/26/22)   left VM to schedule HST 03/02/22 [place]

## 2022-03-10 ENCOUNTER — Ambulatory Visit (HOSPITAL_COMMUNITY)
Admission: RE | Admit: 2022-03-10 | Discharge: 2022-03-10 | Disposition: A | Discharge status: Home / Self Care | Payer: PPO | Source: Ambulatory Visit | Attending: Vascular Surgery | Admitting: Vascular Surgery

## 2022-03-10 ENCOUNTER — Other Ambulatory Visit (HOSPITAL_COMMUNITY): Payer: Self-pay | Admitting: Internal Medicine

## 2022-03-10 DIAGNOSIS — R600 Localized edema: Secondary | ICD-10-CM

## 2022-03-10 DIAGNOSIS — J309 Allergic rhinitis, unspecified: Secondary | ICD-10-CM | POA: Diagnosis not present

## 2022-03-10 DIAGNOSIS — B351 Tinea unguium: Secondary | ICD-10-CM | POA: Diagnosis not present

## 2022-03-15 ENCOUNTER — Telehealth: Payer: Self-pay | Admitting: Interventional Cardiology

## 2022-03-15 NOTE — Progress Notes (Addendum)
Office Visit    Patient Name: [name] Date of Encounter: 03/15/2022  Primary Care Provider:  Mckinley Jewel, MD Primary Cardiologist:  Larae Grooms, MD Primary Electrophysiologist: None  Chief Complaint    [name] is a 81 y.o. female with PMH of HLD, obstructive sleep apnea, HTN who presents today for complaint of shortness of breath and weight gain.  Past Medical History    Past Medical History:  Diagnosis Date   Anemia    BCC (basal cell carcinoma of skin) 03/27/2006   Above Left Upper Lip (MOH's)   Benign essential tremor    Chronic kidney disease    Essential hypertension, benign    Fibromyalgia    GERD (gastroesophageal reflux disease)    No longer a problem   Heart murmur    Hemorrhoids    Hyponatremia    secondary to hctz-recurrent March 2010   Low back pain    facet joint pain   Occipital neuralgia    Osteoarthritis    Osteopenia    Pneumonia    Pure hypercholesterolemia 12/16/2013   SCCA (squamous cell carcinoma) of skin 07/15/2002   Left Inner Cheek(in situ)   SCCA (squamous cell carcinoma) of skin 05/06/2004   Right Wing Nose(in situ) -Cx3,5FU   SCCA (squamous cell carcinoma) of skin 01/09/2011   Above Right Upper Lip(in situ)-Cx3   SCCA (squamous cell carcinoma) of skin 01/05/2014   Left Temple(in situ)-Cx3,5FU   SCCA (squamous cell carcinoma) of skin 09/30/2014   Right Wrist(in situ)-tx p bx   Shortness of breath dyspnea    Sleep apnea    Mild sleep apnea   Superficial basal cell carcinoma (BCC) 08/17/1982   Right Side Upper Lip   Tuberculosis    as a child   Past Surgical History:  Procedure Laterality Date   TOTAL KNEE ARTHROPLASTY Left 11/08/2015   Procedure: LEFT TOTAL KNEE ARTHROPLASTY;  Surgeon: Gaynelle Arabian, MD;  Location: [facility];  Service: Orthopedics;  Laterality: Left;   TOTAL KNEE ARTHROPLASTY Right 12/19/2021   Procedure: TOTAL KNEE ARTHROPLASTY;  Surgeon: Gaynelle Arabian, MD;  Location: [facility];  Service:  Orthopedics;  Laterality: Right;   TUBAL LIGATION      Allergies  Allergies  Allergen Reactions   Propoxyphene Other (See Comments)   Erythromycin Base Other (See Comments)   Hctz [Hydrochlorothiazide] Other (See Comments)    Hyponatremia    Lisinopril Other (See Comments)    Hyponatremia   Sulfa Antibiotics Rash    History of Present Illness    [name] is a 81 year old female with the above-mentioned past medical history who presents today for complaint of weight gain and shortness of breath.  She was initially seen by Dr. Irish Lack on 12/2013 for preoperative clearance of total knee replacement.  Lexiscan Myoview was completed and showed no ischemia with normal heart function.  Most recent 2D echo was completed 01/2021 showing hyperdynamic EF of 75%, no RWMA, grade 1 DD mildly dilated LA with normal valve function.  She was last seen by Dr.Varanasi on 03/2021 for follow-up.  During visit patient was doing well and participating in exercise program at [facility] for fibromyalgia.  She had no cardiac symptoms to report at that time.  No medication changes were made during visit.  [name] presents today for follow-up alone today.  Since last being seen in the office patient reports she is experienced sharp pain over her left sternum extending to her back and also has been  very fatigued over the past 2 weeks.  She recently had right total knee replacement in June and was seen by Ortho care and had DVT ruled out by ultrasound.  She also endorses some shortness of breath with activity with chest discomfort with deep inhalation exhalation.  Patient denies palpitations, dyspnea, PND, orthopnea, nausea, vomiting, dizziness, syncope, edema, weight gain, or early satiety.  Home Medications    Current Outpatient Medications  Medication Sig Dispense Refill   amLODipine (NORVASC) 5 MG tablet TAKE ONE TABLET BY MOUTH DAILY 90 tablet 2   atenolol (TENORMIN) 25 MG tablet TAKE ONE TABLET BY MOUTH DAILY 90  tablet 1   cetirizine (ZYRTEC) 10 MG tablet Take 10 mg by mouth daily.     cyclobenzaprine (FLEXERIL) 10 MG tablet Take 1 tablet (10 mg total) by mouth 3 (three) times daily as needed for muscle spasms. 30 tablet 0   furosemide (LASIX) 20 MG tablet Take 20 mg by mouth daily.     HYDROcodone-acetaminophen (NORCO/VICODIN) 5-325 MG tablet Take 1-2 tablets by mouth every 6 (six) hours as needed for severe pain. 42 tablet 0   hydroxypropyl methylcellulose / hypromellose (ISOPTO TEARS / GONIOVISC) 2.5 % ophthalmic solution Place 1 drop into both eyes as needed for dry eyes.     pregabalin (LYRICA) 75 MG capsule Take 75 mg by mouth daily.     primidone (MYSOLINE) 50 MG tablet Take 1 tablet (50 mg total) by mouth 2 (two) times daily. 60 tablet 0   simvastatin (ZOCOR) 40 MG tablet Take 1 tablet (40 mg total) by mouth at bedtime. 90 tablet 2   sodium chloride (OCEAN) 0.65 % SOLN nasal spray Place 1 spray into both nostrils as needed for congestion.     traMADol (ULTRAM) 50 MG tablet Take 2 tablets (100 mg total) by mouth every 12 (twelve) hours as needed. 10 tablet 0   traZODone (DESYREL) 50 MG tablet Take 1 tablet (50 mg total) by mouth at bedtime as needed for sleep. 30 tablet 0   Vitamin D, Ergocalciferol, (DRISDOL) 1.25 MG (50000 UNIT) CAPS capsule Take 50,000 Units by mouth once a week.     No current facility-administered medications for this visit.     Review of Systems  Please see the history of present illness.    (+) Right lower extremity edema (+) Shortness of breath  All other systems reviewed and are otherwise negative except as noted above.  Physical Exam    Wt Readings from Last 3 Encounters:  02/22/22 166 lb 12.8 oz (75.7 kg)  12/19/21 170 lb 9.6 oz (77.4 kg)  12/13/21 170 lb 9.6 oz (77.4 kg)   UD:JSHFW were no vitals filed for this visit.,There is no height or weight on file to calculate BMI.  Constitutional:      Appearance: Healthy appearance. Not in distress.  Neck:      Vascular: JVD normal.  Pulmonary:     Effort: Pulmonary effort is normal.     Breath sounds: No wheezing. No rales. Diminished in the bases Cardiovascular:     Normal rate. Regular rhythm. Normal S1. Normal S2.      Murmurs: There is no murmur.  Edema:    Peripheral edema trace in the left lower extremity and +1 and right lower extremity Abdominal:     Palpations: Abdomen is soft non tender. There is no hepatomegaly.  Skin:    General: Skin is warm and dry.  Neurological:     General: No focal deficit present.  Mental Status: Alert and oriented to person, place and time.     Cranial Nerves: Cranial nerves are intact.  EKG/LABS/Other Studies Reviewed    ECG personally reviewed by me today -sinus rhythm with rate of 67 bpm with no acute changes noted compared to previous EKG.  Risk Assessment/Calculations:     Lab Results  Component Value Date   WBC 11.2 (H) 12/20/2021   HGB 10.2 (L) 12/20/2021   HCT 30.2 (L) 12/20/2021   MCV 95.9 12/20/2021   PLT 192 12/20/2021   Lab Results  Component Value Date   CREATININE 1.03 (H) 12/20/2021   BUN 14 12/20/2021   NA 132 (L) 12/20/2021   K 4.6 12/20/2021   CL 102 12/20/2021   CO2 24 12/20/2021   Lab Results  Component Value Date   ALT 16 12/13/2021   AST 24 12/13/2021   ALKPHOS 92 12/13/2021   BILITOT 0.8 12/13/2021   No results found for: "CHOL", "HDL", "LDLCALC", "LDLDIRECT", "TRIG", "CHOLHDL"  No results found for: "HGBA1C"  Assessment & Plan    1.  Dyspnea on exertion: -Patient's last 2D echo was completed on 01/2021 with EF of 75%, no RWMA, grade 1 DD mildly dilated LA with normal valve function -She also endorses some chest discomfort with deep inhalation and exhalation. -D-dimer today to rule out possible DVT -Cardiac CT of the chest to rule out possible PE related to recent total knee replacement  2.  Lower extremity swelling: Recent U/S negative for DVT she is s/p right knee replacement.  Lower extremity  ultrasound completed at orthopedic surgeon's office that was normal -Today patient reports lower extremity pain and redness with shortness of breath and increased chest discomfort -Patient will  increase Lasix to 40 mg for 2 days and then will take 20 mg daily -BMET in 1 week Low sodium diet, fluid restriction <2L, and daily weights encouraged. Educated to contact our office for weight gain of 2 lbs overnight or 5 lbs in one week.   3.  Hypertension: -Blood pressure today was well controlled at 128/82 -Continue atenolol 25 mg daily, and amlodipine 5 mg daily  4.  Hyperlipidemia: -Patient's last LDL cholesterol was 60 -Continue Zocor 40 mg daily  Disposition: Follow-up with Larae Grooms, MD or APP in 2 weeks   Medication Adjustments/Labs and Tests Ordered: Current medicines are reviewed at length with the patient today.  Concerns regarding medicines are outlined above.   Signed, Mable Fill, Marissa Nestle, NP 03/15/2022, 6:00 PM [place]

## 2022-03-15 NOTE — Telephone Encounter (Signed)
I spoke with patient.  She had right knee surgery in June. Saw surgeon about 10 days ago and was advised to have right leg swelling evaluated.  Saw provider at PCP's office last week and ultrasound was done.  Negative for DVT.  No other changes made.  Patient reports she has gained 5 lbs in a week.  Shortness of breath is a little worse.  Right leg is swollen and red.  She has started wearing compression stockings which has helped the swelling some.  She is requesting evaluation in our office.  Appointment made for patient to see Ambrose Pancoast, NP tomorrow at 8:50

## 2022-03-15 NOTE — Telephone Encounter (Signed)
Pt c/o swelling: STAT is pt has developed SOB within 24 hours  If swelling, where is the swelling located? Right leg from her ankle to half way up her calf  How much weight have you gained and in what time span? Has gained 5 lbs in a week   Have you gained 3 pounds in a day or 5 pounds in a week? Gained 5lbs in a week  Do you have a log of your daily weights (if so, list)? no  Are you currently taking a fluid pill? She has been taking them for the past week.   Are you currently SOB? Has been SOB for a long time.   Have you traveled recently? No  She states she had knee replacement surgery on the right leg in June, she saw her surgeon who told her she needed to see someone about the redness and swelling she was having.  She states it has been red and swollen for the past 10 days.

## 2022-03-16 ENCOUNTER — Telehealth: Payer: Self-pay | Admitting: Nurse Practitioner

## 2022-03-16 ENCOUNTER — Inpatient Hospital Stay (HOSPITAL_COMMUNITY): Admission: RE | Admit: 2022-03-16 | Payer: PPO | Source: Ambulatory Visit

## 2022-03-16 ENCOUNTER — Encounter: Payer: Self-pay | Admitting: Nurse Practitioner

## 2022-03-16 ENCOUNTER — Telehealth: Payer: Self-pay | Admitting: *Deleted

## 2022-03-16 ENCOUNTER — Ambulatory Visit: Payer: PPO | Attending: Nurse Practitioner | Admitting: Nurse Practitioner

## 2022-03-16 ENCOUNTER — Ambulatory Visit (HOSPITAL_BASED_OUTPATIENT_CLINIC_OR_DEPARTMENT_OTHER)
Admission: RE | Admit: 2022-03-16 | Discharge: 2022-03-16 | Disposition: A | Discharge status: Home / Self Care | Payer: PPO | Source: Ambulatory Visit | Attending: Nurse Practitioner | Admitting: Nurse Practitioner

## 2022-03-16 VITALS — BP 128/82 | HR 67 | Ht 65.0 in | Wt 169.0 lb

## 2022-03-16 DIAGNOSIS — M79604 Pain in right leg: Secondary | ICD-10-CM | POA: Insufficient documentation

## 2022-03-16 DIAGNOSIS — I10 Essential (primary) hypertension: Secondary | ICD-10-CM

## 2022-03-16 DIAGNOSIS — R0789 Other chest pain: Secondary | ICD-10-CM | POA: Diagnosis not present

## 2022-03-16 DIAGNOSIS — R0609 Other forms of dyspnea: Secondary | ICD-10-CM

## 2022-03-16 DIAGNOSIS — I82401 Acute embolism and thrombosis of unspecified deep veins of right lower extremity: Secondary | ICD-10-CM | POA: Diagnosis not present

## 2022-03-16 LAB — POCT I-STAT CREATININE: Creatinine, Ser: 0.9 mg/dL (ref 0.44–1.00)

## 2022-03-16 LAB — D-DIMER, QUANTITATIVE: D-DIMER: 2.22 mg/L FEU — ABNORMAL HIGH (ref 0.00–0.49)

## 2022-03-16 MED ORDER — IOHEXOL 350 MG/ML SOLN
75.0000 mL | Freq: Once | INTRAVENOUS | Status: AC | PRN
  Administered 2022-03-16: 75 mL via INTRAVENOUS

## 2022-03-16 NOTE — Telephone Encounter (Signed)
S/w pt due to wrong test being ordered today.  Changed to Stat CT Scan W/O to R/O DVT. Pt is waiting for phone call for scheduled CT scan.  Ivin Booty will be calling pt with time and location.

## 2022-03-16 NOTE — Patient Instructions (Signed)
Medication Instructions:   INCREASE Lasix two (2) tablets by mouth ( 40 mg ) daily X 2 days than go back to one (1) tablet by mouth (20 mg) daily.  *If you need a refill on your cardiac medications before your next appointment, please call your pharmacy*   Lab Work:  TODAY!!!! D DIMER STAT  Your physician recommends that you return for lab work on Thursday, September 14. You can come in on the day of your appointment anytime between 7:30-4:30.    If you have labs (blood work) drawn today and your tests are completely normal, you will receive your results only by: [place] (if you have MyChart) OR A paper copy in the mail If you have any lab test that is abnormal or we need to change your treatment, we will call you to review the results.   Testing/Procedures:  Your physician has requested that you have a lower or upper extremity venous duplex. This test is an ultrasound of the veins in the legs or arms. It looks at venous blood flow that carries blood from the heart to the legs or arms. Allow one hour for a Lower Venous exam. Allow thirty minutes for an Upper Venous exam. There are no restrictions or special instructions.  TODAY at [facility] location.  Be there by 3:00 please.    Follow-Up: At [facility], you and your health needs are our priority.  As part of our continuing mission to provide you with exceptional heart care, we have created designated Provider Care Teams.  These Care Teams include your primary Cardiologist (physician) and Advanced Practice Providers (APPs -  Physician Assistants and Nurse Practitioners) who all work together to provide you with the care you need, when you need it.  We recommend signing up for the patient portal called "MyChart".  Sign up information is provided on this After Visit Summary.  MyChart is used to connect with patients for Virtual Visits (Telemedicine).  Patients are able to view lab/test results, encounter notes,  upcoming appointments, etc.  Non-urgent messages can be sent to your provider as well.   To learn more about what you can do with MyChart, go to [email or web address].    Your next appointment:   2 week(s)  The format for your next appointment:   In Person  Provider:   Ambrose Pancoast, NP         Important Information About Sugar

## 2022-03-16 NOTE — Telephone Encounter (Signed)
Ms. [name] was contacted and CT chest results were shared with her regarding possible pulmonary embolism.  She was informed that the test was negative for pulmonary embolism.  Based on her positive D-dimer I will repeat lower leg Doppler and plan to see her back in 2 weeks as scheduled.  She had no other questions and was thankful for the call today.  Ambrose Pancoast, NP

## 2022-03-17 ENCOUNTER — Encounter: Payer: Self-pay | Admitting: Cardiovascular Disease

## 2022-03-17 ENCOUNTER — Other Ambulatory Visit: Payer: Self-pay | Admitting: *Deleted

## 2022-03-17 ENCOUNTER — Ambulatory Visit (HOSPITAL_COMMUNITY)
Admission: RE | Admit: 2022-03-17 | Discharge: 2022-03-17 | Disposition: A | Discharge status: Home / Self Care | Payer: PPO | Source: Ambulatory Visit | Attending: Cardiovascular Disease | Admitting: Cardiovascular Disease

## 2022-03-17 ENCOUNTER — Ambulatory Visit: Payer: PPO | Admitting: Cardiovascular Disease

## 2022-03-17 VITALS — BP 138/77 | HR 70 | Ht 65.0 in | Wt 166.0 lb

## 2022-03-17 DIAGNOSIS — I82461 Acute embolism and thrombosis of right calf muscular vein: Secondary | ICD-10-CM | POA: Insufficient documentation

## 2022-03-17 DIAGNOSIS — E7800 Pure hypercholesterolemia, unspecified: Secondary | ICD-10-CM

## 2022-03-17 DIAGNOSIS — I700 Atherosclerosis of aorta: Secondary | ICD-10-CM | POA: Insufficient documentation

## 2022-03-17 DIAGNOSIS — M79604 Pain in right leg: Secondary | ICD-10-CM | POA: Diagnosis not present

## 2022-03-17 DIAGNOSIS — I82401 Acute embolism and thrombosis of unspecified deep veins of right lower extremity: Secondary | ICD-10-CM | POA: Diagnosis not present

## 2022-03-17 DIAGNOSIS — I10 Essential (primary) hypertension: Secondary | ICD-10-CM | POA: Diagnosis not present

## 2022-03-17 MED ORDER — APIXABAN 5 MG PO TABS: 5.0000 mg | ORAL_TABLET | Freq: Two times a day (BID) | ORAL | 2 refills | Status: DC

## 2022-03-17 NOTE — Patient Instructions (Addendum)
Medication Instructions:  START Eliquis 5 mg twice daily for three months STOP the Primidone until finished with the Eliquis  *If you need a refill on your cardiac medications before your next appointment, please call your pharmacy*   Lab Work: None ordered If you have labs (blood work) drawn today and your tests are completely normal, you will receive your results only by: [place] (if you have MyChart) OR A paper copy in the mail If you have any lab test that is abnormal or we need to change your treatment, we will call you to review the results.   Testing/Procedures: None ordered   Follow-Up: At [facility], you and your health needs are our priority.  As part of our continuing mission to provide you with exceptional heart care, we have created designated Provider Care Teams.  These Care Teams include your primary Cardiologist (physician) and Advanced Practice Providers (APPs -  Physician Assistants and Nurse Practitioners) who all work together to provide you with the care you need, when you need it.  We recommend signing up for the patient portal called "MyChart".  Sign up information is provided on this After Visit Summary.  MyChart is used to connect with patients for Virtual Visits (Telemedicine).  Patients are able to view lab/test results, encounter notes, upcoming appointments, etc.  Non-urgent messages can be sent to your provider as well.   To learn more about what you can do with MyChart, go to [email or web address].    Your next appointment:   Keep your follow up as scheduled  Important Information About Sugar

## 2022-03-17 NOTE — Progress Notes (Signed)
Cardiology Office Note:    Date:  03/17/2022   ID:  Briana, Krause 03-13-1941, MRN [id number]  PCP:  Mckinley Jewel, MD   [place] Providers Cardiologist:  Larae Grooms, MD     Referring MD: Mckinley Jewel, MD   Chief Complaint  Patient presents with   DVT  [name] was identified as having a infrapopliteal right DVT by ultrasound testing today, a few months after undergoing right total knee replacement.  History of Present Illness:    [name] is a 81 y.o. female with a hx of benign essential tremor, mild chronic kidney disease, hypertension, hypercholesterolemia, now roughly 3 months following right total knee replacement.  She has had some problems with discomfort in her right calf which began feeling "spongy" and tender and noticeably swollen compared to the contralateral side.  Ultrasound performed in office today shows the presence of right soleal acute DVT.  There is no evidence of supra popliteal DVT.  She has been walking without cane or walker.  She has not been immobile.  She finds it less comfortable to keep her leg elevated since the knee surgery.  She does participate in regular exercise programs at [facility] for fibromyalgia.  Her symptoms are mild.  She has had some transient brief problems with left parasternal pain radiating to her back, but CT angiogram of the chest performed yesterday did not show any evidence of pulmonary embolism.  Incidental note was made of aortic and coronary atherosclerotic calcifications.  She denies angina or dyspnea today, has not had any exertional complaints, has not had palpitations, syncope, cough or hemoptysis, orthopnea or PND.  Past Medical History:  Diagnosis Date   Anemia    BCC (basal cell carcinoma of skin) 03/27/2006   Above Left Upper Lip (MOH's)   Benign essential tremor    Chronic kidney disease    Essential hypertension, benign    Fibromyalgia    GERD (gastroesophageal reflux disease)    No longer  a problem   Heart murmur    Hemorrhoids    Hyponatremia    secondary to hctz-recurrent March 2010   Low back pain    facet joint pain   Occipital neuralgia    Osteoarthritis    Osteopenia    Pneumonia    Pure hypercholesterolemia 12/16/2013   SCCA (squamous cell carcinoma) of skin 07/15/2002   Left Inner Cheek(in situ)   SCCA (squamous cell carcinoma) of skin 05/06/2004   Right Wing Nose(in situ) -Cx3,5FU   SCCA (squamous cell carcinoma) of skin 01/09/2011   Above Right Upper Lip(in situ)-Cx3   SCCA (squamous cell carcinoma) of skin 01/05/2014   Left Temple(in situ)-Cx3,5FU   SCCA (squamous cell carcinoma) of skin 09/30/2014   Right Wrist(in situ)-tx p bx   Shortness of breath dyspnea    Sleep apnea    Mild sleep apnea   Superficial basal cell carcinoma (BCC) 08/17/1982   Right Side Upper Lip   Tuberculosis    as a child    Past Surgical History:  Procedure Laterality Date   TOTAL KNEE ARTHROPLASTY Left 11/08/2015   Procedure: LEFT TOTAL KNEE ARTHROPLASTY;  Surgeon: Gaynelle Arabian, MD;  Location: [facility];  Service: Orthopedics;  Laterality: Left;   TOTAL KNEE ARTHROPLASTY Right 12/19/2021   Procedure: TOTAL KNEE ARTHROPLASTY;  Surgeon: Gaynelle Arabian, MD;  Location: [facility];  Service: Orthopedics;  Laterality: Right;   TUBAL LIGATION      Current Medications: Current Meds  Medication Sig  amLODipine (NORVASC) 5 MG tablet TAKE ONE TABLET BY MOUTH DAILY   apixaban (ELIQUIS) 5 MG TABS tablet Take 1 tablet (5 mg total) by mouth 2 (two) times daily.   atenolol (TENORMIN) 25 MG tablet TAKE ONE TABLET BY MOUTH DAILY   cetirizine (ZYRTEC) 10 MG tablet Take 10 mg by mouth daily.   cyclobenzaprine (FLEXERIL) 10 MG tablet Take 1 tablet (10 mg total) by mouth 3 (three) times daily as needed for muscle spasms.   furosemide (LASIX) 20 MG tablet Take 20 mg by mouth daily.   hydroxypropyl methylcellulose / hypromellose (ISOPTO TEARS / GONIOVISC) 2.5 % ophthalmic solution Place 1 drop into  both eyes as needed for dry eyes.   pregabalin (LYRICA) 75 MG capsule Take 75 mg by mouth daily.   primidone (MYSOLINE) 50 MG tablet Take 1 tablet (50 mg total) by mouth 2 (two) times daily.   simvastatin (ZOCOR) 40 MG tablet Take 1 tablet (40 mg total) by mouth at bedtime.   sodium chloride (OCEAN) 0.65 % SOLN nasal spray Place 1 spray into both nostrils as needed for congestion.   traMADol (ULTRAM) 50 MG tablet Take 2 tablets (100 mg total) by mouth every 12 (twelve) hours as needed.   traZODone (DESYREL) 50 MG tablet Take 1 tablet (50 mg total) by mouth at bedtime as needed for sleep.   Vitamin D, Ergocalciferol, (DRISDOL) 1.25 MG (50000 UNIT) CAPS capsule Take 50,000 Units by mouth once a week.     Allergies:   Propoxyphene, Erythromycin base, Hctz [hydrochlorothiazide], Lisinopril, and Sulfa antibiotics   Social History   Socioeconomic History   Marital status: Divorced    Spouse name: Not on file   Number of children: 2   Years of education: Masters   Highest education level: Not on file  Occupational History   Occupation: Retired  Tobacco Use   Smoking status: Never   Smokeless tobacco: Never  Vaping Use   Vaping Use: Never used  Substance and Sexual Activity   Alcohol use: Yes    Alcohol/week: 7.0 standard drinks of alcohol    Types: 7 Standard drinks or equivalent per week    Comment: Cocktail daily   Drug use: No   Sexual activity: Not on file  Other Topics Concern   Not on file  Social History Narrative   Denies caffeine use    Social Determinants of Health   Financial Resource Strain: Not on file  Food Insecurity: Not on file  Transportation Needs: Not on file  Physical Activity: Not on file  Stress: Not on file  Social Connections: Not on file     Family History: The patient's family history includes Cancer - Ovarian in her mother; Congestive Heart Failure in her mother; Heart disease in her father and mother; Prostate cancer in her father; Stroke in her  sister.  ROS:   Please see the history of present illness.    Preliminary review of lower extremity venous duplex study shows all other systems reviewed and are negative.  EKGs/Labs/Other Studies Reviewed:    The following studies were reviewed today: Venous duplex ultrasound of the lower extremities today: Acute DVT limited to the right soleal veins  Echocardiogram 01/22/2021; Murmur secondary to hyperdynamic LV contraction with turbulence through outflow tract. There is no significant outflow tract gradient, however. 2. Left ventricular ejection fraction, by estimation, is >75%. The left ventricle has hyperdynamic function. The left ventricle has no regional wall motion abnormalities. Left ventricular diastolic parameters are consistent with Grade  I diastolic dysfunction (impaired relaxation). 3. Right ventricular systolic function is normal. The right ventricular size is normal. There is normal pulmonary artery systolic pressure. The estimated right ventricular systolic pressure is 05.3 mmHg. 4. Left atrial size was mildly dilated. 5. The mitral valve is normal in structure. Trivial mitral valve regurgitation. No evidence of mitral stenosis. 6. The aortic valve is normal in structure. Aortic valve regurgitation is not visualized. No aortic stenosis is present. 7. The inferior vena cava is normal in size with greater than 50% respiratory variability, suggesting right atrial pressure of 3 mmHg.  EKG:  EKG is not ordered today.   Recent Labs: 12/13/2021: ALT 16 12/20/2021: BUN 14; Hemoglobin 10.2; Platelets 192; Potassium 4.6; Sodium 132 03/16/2022: Creatinine, Ser 0.90  Recent Lipid Panel No results found for: "CHOL", "TRIG", "HDL", "CHOLHDL", "VLDL", "LDLCALC", "LDLDIRECT"   Risk Assessment/Calculations:                Physical Exam:    VS:  BP 138/77   Pulse 70   Ht '5\' 5"'$  (1.651 m)   Wt 166 lb (75.3 kg)   BMI 27.62 kg/m     Wt Readings from Last 3 Encounters:   03/17/22 166 lb (75.3 kg)  03/16/22 169 lb (76.7 kg)  02/22/22 166 lb 12.8 oz (75.7 kg)     GEN:  Well nourished, well developed in no acute distress HEENT: Normal NECK: No JVD; No carotid bruits LYMPHATICS: No lymphadenopathy CARDIAC: RRR, grade 1/6 aortic ejection murmur is early peaking, no diastolic murmurs, rubs, gallops RESPIRATORY:  Clear to auscultation without rales, wheezing or rhonchi  ABDOMEN: Soft, non-tender, non-distended MUSCULOSKELETAL: 1+ edema and slight erythema of the right lower calf.  Well-healed total knee replacement scar. SKIN: Warm and dry NEUROLOGIC:  Alert and oriented x 3 PSYCHIATRIC:  Normal affect   ASSESSMENT:    1. Acute deep vein thrombosis (DVT) of calf muscle vein of right lower extremity (HCC)   2. Essential hypertension   3. Atherosclerosis of aorta ([place])   4. Pure hypercholesterolemia    PLAN:    In order of problems listed above:  DVT: Even though this occurred with some delay from her knee surgery I think is still falls in the category of "provoked" DVT.  This is the first and only thrombotic complication she has had until age 44.  Recommend treatment with anticoagulation for 3 months.  Keep the right lower extremity elevated as much as possible when she is sitting down.  There is an interaction between primidone and all the direct oral anticoagulants.  Even if she has to stop the primidone and may have to put up with a more essential tremor I still think it would be better to use a direct oral anticoagulant and warfarin.  She states that she can put up with not using the primidone for a few months. HTN: Initially her blood pressure was a little high, but improved when we rechecked it. Aortic and coronary atherosclerosis on CT chest: No evidence of abnormalities in aortic caliber.  She is on lipid-lowering therapy.  She does not have any symptoms of CAD.  Low risk nuclear stress test in 2015.  Normal left ventricular systolic function by echo  last year. HLP: LDL 60, well within target range on simvastatin therapy.  Continue.  She already has a follow-up scheduled Dr. Irish Lack in a couple of weeks.           Medication Adjustments/Labs and Tests Ordered: Current medicines are reviewed  at length with the patient today.  Concerns regarding medicines are outlined above.  No orders of the defined types were placed in this encounter.  Meds ordered this encounter  Medications   apixaban (ELIQUIS) 5 MG TABS tablet    Sig: Take 1 tablet (5 mg total) by mouth 2 (two) times daily.    Dispense:  60 tablet    Refill:  2    Patient Instructions  Medication Instructions:  START Eliquis 5 mg twice daily for three months STOP the Primidone until finished with the Eliquis  *If you need a refill on your cardiac medications before your next appointment, please call your pharmacy*   Lab Work: None ordered If you have labs (blood work) drawn today and your tests are completely normal, you will receive your results only by: [place] (if you have MyChart) OR A paper copy in the mail If you have any lab test that is abnormal or we need to change your treatment, we will call you to review the results.   Testing/Procedures: None ordered   Follow-Up: At [facility], you and your health needs are our priority.  As part of our continuing mission to provide you with exceptional heart care, we have created designated Provider Care Teams.  These Care Teams include your primary Cardiologist (physician) and Advanced Practice Providers (APPs -  Physician Assistants and Nurse Practitioners) who all work together to provide you with the care you need, when you need it.  We recommend signing up for the patient portal called "MyChart".  Sign up information is provided on this After Visit Summary.  MyChart is used to connect with patients for Virtual Visits (Telemedicine).  Patients are able to view lab/test results, encounter notes,  upcoming appointments, etc.  Non-urgent messages can be sent to your provider as well.   To learn more about what you can do with MyChart, go to [email or web address].    Your next appointment:   Keep your follow up as scheduled  Important Information About Sugar         Signed, Sanda Klein, MD  03/17/2022 6:19 PM    [place]

## 2022-03-17 NOTE — Telephone Encounter (Signed)
[name] was contacted this morning and advised that she is scheduled for lower extremity venous ultrasound to evaluate for possible DVT.  She was thankful for the call and had no further questions at that time.  Ambrose Pancoast, NP

## 2022-03-19 ENCOUNTER — Other Ambulatory Visit: Payer: Self-pay | Admitting: Neurology

## 2022-03-19 NOTE — Addendum Note (Signed)
Addended by: Sanda Klein on: 03/19/2022 01:58 PM   Modules accepted: Orders

## 2022-03-20 ENCOUNTER — Telehealth: Payer: Self-pay | Admitting: Interventional Cardiology

## 2022-03-20 NOTE — Telephone Encounter (Signed)
Attempted phone call to pt.  Unable to leave voicemail message as voicemail is full.  Will forward to [employer] who is out of the office this afternoon.

## 2022-03-20 NOTE — Telephone Encounter (Signed)
Patient was calling to talk to [name] she stated she was calling to ask him a question. Please advise

## 2022-03-21 NOTE — Telephone Encounter (Signed)
Call returned today to Ms. Cupps.  Patient had question regarding Eliquis and length of time for current treatment with medication.  I advised that per Dr.Croitoru's office note on 9/8, the current recommendation was 3 months for Eliquis treatment.  Patient thanked me for the call and all questions were answered.  Patient encouraged to reach out with additional questions in the future.  Ambrose Pancoast, NP

## 2022-03-23 ENCOUNTER — Ambulatory Visit: Payer: PPO | Attending: Nurse Practitioner

## 2022-03-23 DIAGNOSIS — M79604 Pain in right leg: Secondary | ICD-10-CM

## 2022-03-23 DIAGNOSIS — R0609 Other forms of dyspnea: Secondary | ICD-10-CM | POA: Diagnosis not present

## 2022-03-23 DIAGNOSIS — I10 Essential (primary) hypertension: Secondary | ICD-10-CM | POA: Diagnosis not present

## 2022-03-23 LAB — BASIC METABOLIC PANEL
BUN/Creatinine Ratio: 10 — ABNORMAL LOW (ref 12–28)
BUN: 12 mg/dL (ref 8–27)
CO2: 27 mmol/L (ref 20–29)
Calcium: 9.5 mg/dL (ref 8.7–10.3)
Chloride: 96 mmol/L (ref 96–106)
Creatinine, Ser: 1.15 mg/dL — ABNORMAL HIGH (ref 0.57–1.00)
Glucose: 114 mg/dL — ABNORMAL HIGH (ref 70–99)
Potassium: 4.3 mmol/L (ref 3.5–5.2)
Sodium: 136 mmol/L (ref 134–144)
eGFR: 48 mL/min/{1.73_m2} — ABNORMAL LOW (ref >59)

## 2022-03-24 ENCOUNTER — Telehealth: Payer: Self-pay | Admitting: *Deleted

## 2022-03-24 ENCOUNTER — Telehealth: Payer: Self-pay | Admitting: Cardiovascular Disease

## 2022-03-24 DIAGNOSIS — R7989 Other specified abnormal findings of blood chemistry: Secondary | ICD-10-CM

## 2022-03-24 MED ORDER — FUROSEMIDE 20 MG PO TABS: 20.0000 mg | ORAL_TABLET | Freq: Every day | ORAL | 3 refills | Status: DC | PRN

## 2022-03-24 NOTE — Telephone Encounter (Signed)
   Pt is requesting a letter stating she can be a part of an exercise program for research project at [facility]. She said, it should have a signature from Dr. Loletha Grayer and  a letter head. She said, she can pick it up at the office or mail it to her home address

## 2022-03-24 NOTE — Telephone Encounter (Signed)
-----   Message from Marylu Lund., NP sent at 03/24/2022  7:51 AM EDT ----- Please let [name] know that her creatine and kidney function has since taking your lasix.   Plan:  -Please have patient change her lasix from daily to PRN and advise her to weigh daily and monitor her salt intake. -Please recheck BMET in two weeks  Ambrose Pancoast, NP

## 2022-03-24 NOTE — Telephone Encounter (Signed)
Is this okay to do?  Thanks!

## 2022-03-24 NOTE — Telephone Encounter (Signed)
Informed patient of results and verbal understanding expressed. Pt will stop by the office 9/29 for repeat blood work

## 2022-03-24 NOTE — Addendum Note (Signed)
Addended by: Stanton Kidney on: 03/24/2022 08:29 AM   Modules accepted: Orders

## 2022-03-27 NOTE — Progress Notes (Unsigned)
Office Visit    Patient Name: AHLAYA ENDE Date of Encounter: 03/29/2022  Primary Care Provider:  Mckinley Jewel, MD Primary Cardiologist:  Larae Grooms, MD Primary Electrophysiologist: None  Chief Complaint   BETHZAIDA BOORD is a 81 y.o. female with PMH of HLD, obstructive sleep apnea, HTN who presents today for 2-week follow-up of recent DVT. Past Medical History    Past Medical History:  Diagnosis Date   Anemia    BCC (basal cell carcinoma of skin) 03/27/2006   Above Left Upper Lip (MOH's)   Benign essential tremor    Chronic kidney disease    Essential hypertension, benign    Fibromyalgia    GERD (gastroesophageal reflux disease)    No longer a problem   Heart murmur    Hemorrhoids    Hyponatremia    secondary to hctz-recurrent March 2010   Low back pain    facet joint pain   Occipital neuralgia    Osteoarthritis    Osteopenia    Pneumonia    Pure hypercholesterolemia 12/16/2013   SCCA (squamous cell carcinoma) of skin 07/15/2002   Left Inner Cheek(in situ)   SCCA (squamous cell carcinoma) of skin 05/06/2004   Right Wing Nose(in situ) -Cx3,5FU   SCCA (squamous cell carcinoma) of skin 01/09/2011   Above Right Upper Lip(in situ)-Cx3   SCCA (squamous cell carcinoma) of skin 01/05/2014   Left Temple(in situ)-Cx3,5FU   SCCA (squamous cell carcinoma) of skin 09/30/2014   Right Wrist(in situ)-tx p bx   Shortness of breath dyspnea    Sleep apnea    Mild sleep apnea   Superficial basal cell carcinoma (BCC) 08/17/1982   Right Side Upper Lip   Tuberculosis    as a child   Past Surgical History:  Procedure Laterality Date   TOTAL KNEE ARTHROPLASTY Left 11/08/2015   Procedure: LEFT TOTAL KNEE ARTHROPLASTY;  Surgeon: Gaynelle Arabian, MD;  Location: [facility];  Service: Orthopedics;  Laterality: Left;   TOTAL KNEE ARTHROPLASTY Right 12/19/2021   Procedure: TOTAL KNEE ARTHROPLASTY;  Surgeon: Gaynelle Arabian, MD;  Location: [facility];  Service: Orthopedics;  Laterality:  Right;   TUBAL LIGATION      Allergies  Allergies  Allergen Reactions   Propoxyphene Other (See Comments)   Erythromycin Base Other (See Comments)   Hctz [Hydrochlorothiazide] Other (See Comments)    Hyponatremia    Lisinopril Other (See Comments)    Hyponatremia   Sulfa Antibiotics Rash    History of Present Illness    DANASHA MELMAN  is a 81 year old female with the above mention past medical history who presents today for 2-week follow-up for recent DVT.  She was initially seen by Dr. Irish Lack on 12/2013 for preoperative clearance of total knee replacement.  Lexiscan Myoview was completed and showed no ischemia with normal heart function.  Most recent 2D echo was completed 01/2021 showing hyperdynamic EF of 75%, no RWMA, grade 1 DD mildly dilated LA with normal valve function.  She was last seen by Dr.Varanasi on 03/2021 for follow-up.  During visit patient was doing well and participating in exercise program at [facility] for fibromyalgia.  She had no cardiac symptoms to report at that time.  No medication changes were made during visit.  She was seen 2 weeks ago with complaint of lower extremity edema and shortness of breath.  She previously had total knee replacement completed by [facility] and presented to the ED where DVT was ruled out by ultrasound.  Patient had not had D-dimer completed and I ordered stat D-dimer that came back positive.  Patient was sent for stat right lower extremity Doppler that was positive for DVT.  She was seen same day by Dr. Sallyanne Kuster and was placed on Eliquis 5 mg twice daily.   Ms. Paula Libra presents today for 2-week follow-up alone.  Since last being seen in the office patient reports that her leg pain has resolved and chest pain is also resolved.  She does still have redness and slight swelling in the right leg however she does not have decreased range of motion.  She is tolerating her current medications without any adverse reactions.  She reports no occult blood in her  urine or stool with her Eliquis.  She is continuing to exercise at her [facility] fibromyalgia program.  She reports that she is overall doing much better since finding out about her DVT.  Patient denies chest pain, palpitations, dyspnea, PND, orthopnea, nausea, vomiting, dizziness, syncope, edema, weight gain, or early satiety.  Home Medications    Current Outpatient Medications  Medication Sig Dispense Refill   amLODipine (NORVASC) 5 MG tablet TAKE ONE TABLET BY MOUTH DAILY 90 tablet 2   apixaban (ELIQUIS) 5 MG TABS tablet Take 1 tablet (5 mg total) by mouth 2 (two) times daily. 60 tablet 2   atenolol (TENORMIN) 25 MG tablet TAKE ONE TABLET BY MOUTH DAILY 90 tablet 1   cetirizine (ZYRTEC) 10 MG tablet Take 10 mg by mouth daily.     furosemide (LASIX) 20 MG tablet Take 1 tablet (20 mg total) by mouth daily. 45 tablet 3   hydroxypropyl methylcellulose / hypromellose (ISOPTO TEARS / GONIOVISC) 2.5 % ophthalmic solution Place 1 drop into both eyes as needed for dry eyes.     pregabalin (LYRICA) 75 MG capsule Take 75 mg by mouth daily.     simvastatin (ZOCOR) 40 MG tablet Take 1 tablet (40 mg total) by mouth at bedtime. 90 tablet 2   sodium chloride (OCEAN) 0.65 % SOLN nasal spray Place 1 spray into both nostrils as needed for congestion.     traMADol (ULTRAM) 50 MG tablet Take 2 tablets (100 mg total) by mouth every 12 (twelve) hours as needed. 10 tablet 0   traZODone (DESYREL) 50 MG tablet TAKE ONE TABLET BY MOUTH EVERY NIGHT AT BEDTIME AS NEEDED FOR SLEEP 30 tablet 4   Vitamin D, Ergocalciferol, (DRISDOL) 1.25 MG (50000 UNIT) CAPS capsule Take 50,000 Units by mouth once a week.     cyclobenzaprine (FLEXERIL) 10 MG tablet Take 1 tablet (10 mg total) by mouth 3 (three) times daily as needed for muscle spasms. 30 tablet 0   primidone (MYSOLINE) 50 MG tablet TAKE 1 TABLET BY MOUTH TWICE A DAY 60 tablet 4   No current facility-administered medications for this visit.     Review of Systems  Please see  the history of present illness.    (+) Right lower extremity swelling  All other systems reviewed and are otherwise negative except as noted above.  Physical Exam    Wt Readings from Last 3 Encounters:  03/29/22 171 lb 3.2 oz (77.7 kg)  03/17/22 166 lb (75.3 kg)  03/16/22 169 lb (76.7 kg)   VS: Vitals:   03/29/22 1107  BP: 130/74  Pulse: 76  SpO2: 96%  ,Body mass index is 28.49 kg/m.  Constitutional:      Appearance: Healthy appearance. Not in distress.  Neck:     Vascular: JVD normal.  Pulmonary:     Effort: Pulmonary effort is normal.     Breath sounds: No wheezing. No rales. Diminished in the bases Cardiovascular:     Normal rate. Regular rhythm. Normal S1. Normal S2.      Murmurs: There is no murmur.  Edema:    Peripheral edema absent.  Abdominal:     Palpations: Abdomen is soft non tender. There is no hepatomegaly.  Skin:    General: Skin is warm and dry.  Neurological:     General: No focal deficit present.     Mental Status: Alert and oriented to person, place and time.     Cranial Nerves: Cranial nerves are intact.  EKG/LABS/Other Studies Reviewed    ECG personally reviewed by me today -none completed today  Lab Results  Component Value Date   WBC 11.2 (H) 12/20/2021   HGB 10.2 (L) 12/20/2021   HCT 30.2 (L) 12/20/2021   MCV 95.9 12/20/2021   PLT 192 12/20/2021   Lab Results  Component Value Date   CREATININE 1.15 (H) 03/23/2022   BUN 12 03/23/2022   NA 136 03/23/2022   K 4.3 03/23/2022   CL 96 03/23/2022   CO2 27 03/23/2022   Lab Results  Component Value Date   ALT 16 12/13/2021   AST 24 12/13/2021   ALKPHOS 92 12/13/2021   BILITOT 0.8 12/13/2021   No results found for: "CHOL", "HDL", "LDLCALC", "LDLDIRECT", "TRIG", "CHOLHDL"  No results found for: "HGBA1C"  Assessment & Plan    1.  Dyspnea on exertion: -Patient presents for 2-week follow-up with complaint of dyspnea on exertion. -Today patient reported no shortness of breath or  chest discomfort -Patient has resumed physical activity and advised to report any decreased exercise tolerance or shortness of breath  2.  History of DVT: -Patient had recent lower extremity ultrasound completed revealing DVT.  She was placed on Eliquis 5 mg twice daily for next 3 months -Today patient reports no chest pain or lower extremity pain -She is tolerating Eliquis with no occult bleeding -Current Eliquis dose appropriate based on most recent creatinine of 1.15  3.  Hypertension: -Blood pressure today was well controlled at 130/74 -Continue atenolol 25 mg daily, and amlodipine 5 mg daily -Continue low-sodium heart healthy diet  4.  Hyperlipidemia: -Patient's last LDL cholesterol was 60 -Continue Zocor 40 mg daily    Disposition: Follow-up with Larae Grooms, MD or APP in 2 months    Medication Adjustments/Labs and Tests Ordered: Current medicines are reviewed at length with the patient today.  Concerns regarding medicines are outlined above.   Signed, Mable Fill, Marissa Nestle, NP 03/29/2022, 12:11 PM [place]

## 2022-03-27 NOTE — Telephone Encounter (Signed)
I called the patient and spoke with her.  HST- HTA auth: [id number] (exp. 02/23/22 to 05/26/22)   She is scheduled at [facility] for 04/25/22 at 11:30 AM.  Mailed packet to the patient.

## 2022-03-28 NOTE — Telephone Encounter (Signed)
Letter typed. Will have it stamped and notified patient I would call her when completed. She would like to come pick up.   Thanks!

## 2022-03-28 NOTE — Telephone Encounter (Signed)
Attempted to contact patient to make her aware to come get her letter.   I sent a mychart message.

## 2022-03-29 ENCOUNTER — Ambulatory Visit: Payer: PPO | Attending: Nurse Practitioner | Admitting: Nurse Practitioner

## 2022-03-29 ENCOUNTER — Encounter: Payer: Self-pay | Admitting: Nurse Practitioner

## 2022-03-29 VITALS — BP 130/74 | HR 76 | Ht 65.0 in | Wt 171.2 lb

## 2022-03-29 DIAGNOSIS — E782 Mixed hyperlipidemia: Secondary | ICD-10-CM

## 2022-03-29 DIAGNOSIS — R0609 Other forms of dyspnea: Secondary | ICD-10-CM | POA: Diagnosis not present

## 2022-03-29 DIAGNOSIS — I10 Essential (primary) hypertension: Secondary | ICD-10-CM

## 2022-03-29 DIAGNOSIS — I82401 Acute embolism and thrombosis of unspecified deep veins of right lower extremity: Secondary | ICD-10-CM | POA: Diagnosis not present

## 2022-03-29 MED ORDER — FUROSEMIDE 20 MG PO TABS: 20.0000 mg | ORAL_TABLET | Freq: Every day | ORAL | 3 refills | Status: DC

## 2022-03-29 NOTE — Patient Instructions (Signed)
Medication Instructions:  Your physician has recommended you make the following change in your medication:   Start taking Lasix/furosemide '20mg'$  every other day  *If you need a refill on your cardiac medications before your next appointment, please call your pharmacy*  Follow-Up: At [facility], you and your health needs are our priority.  As part of our continuing mission to provide you with exceptional heart care, we have created designated Provider Care Teams.  These Care Teams include your primary Cardiologist (physician) and Advanced Practice Providers (APPs -  Physician Assistants and Nurse Practitioners) who all work together to provide you with the care you need, when you need it.   Your next appointment:   2 month(s)  The format for your next appointment:   In Person  Provider:   Ambrose Pancoast, NP         Important Information About Sugar

## 2022-04-07 ENCOUNTER — Ambulatory Visit: Payer: PPO | Attending: Cardiology

## 2022-04-07 DIAGNOSIS — R7989 Other specified abnormal findings of blood chemistry: Secondary | ICD-10-CM | POA: Diagnosis not present

## 2022-04-08 LAB — BASIC METABOLIC PANEL
BUN/Creatinine Ratio: 11 — ABNORMAL LOW (ref 12–28)
BUN: 11 mg/dL (ref 8–27)
CO2: 25 mmol/L (ref 20–29)
Calcium: 9.7 mg/dL (ref 8.7–10.3)
Chloride: 100 mmol/L (ref 96–106)
Creatinine, Ser: 1 mg/dL (ref 0.57–1.00)
Glucose: 97 mg/dL (ref 70–99)
Potassium: 4.7 mmol/L (ref 3.5–5.2)
Sodium: 138 mmol/L (ref 134–144)
eGFR: 56 mL/min/{1.73_m2} — ABNORMAL LOW (ref >59)

## 2022-04-21 ENCOUNTER — Ambulatory Visit (INDEPENDENT_AMBULATORY_CARE_PROVIDER_SITE_OTHER): Payer: PPO | Admitting: Family Medicine

## 2022-04-21 ENCOUNTER — Encounter: Payer: Self-pay | Admitting: Family Medicine

## 2022-04-21 VITALS — BP 116/60 | HR 65 | Temp 98.0°F | Ht 64.5 in | Wt 168.0 lb

## 2022-04-21 DIAGNOSIS — G250 Essential tremor: Secondary | ICD-10-CM

## 2022-04-21 DIAGNOSIS — E7800 Pure hypercholesterolemia, unspecified: Secondary | ICD-10-CM | POA: Diagnosis not present

## 2022-04-21 DIAGNOSIS — I82409 Acute embolism and thrombosis of unspecified deep veins of unspecified lower extremity: Secondary | ICD-10-CM

## 2022-04-21 DIAGNOSIS — M797 Fibromyalgia: Secondary | ICD-10-CM | POA: Diagnosis not present

## 2022-04-21 DIAGNOSIS — I10 Essential (primary) hypertension: Secondary | ICD-10-CM

## 2022-04-21 NOTE — Patient Instructions (Signed)
Melatonin 5 mg at bedtime with the trazodone, may take up to 20 mg at one time.

## 2022-04-21 NOTE — Progress Notes (Unsigned)
New Patient Office Visit  Subjective    Patient ID: [name], female    DOB: 1941/04/27  Age: 81 y.o. MRN: [id number]  CC:  Chief Complaint  Patient presents with  . Establish Care    HPI [name] presents to establish care Patient reports she used to see [facility] physicians, states that she had a DVT in her leg after knee replacement June 2023.  Has been seeing cardiology for this, is currently on Eliquis twice a day. She denies any bleeding or other side effects from the medication at this time. She reports that the DVT was diagnosed in September. States she had to stop her primidone due to the Eliquis.     Outpatient Encounter Medications as of 04/21/2022  Medication Sig  . amLODipine (NORVASC) 5 MG tablet TAKE ONE TABLET BY MOUTH DAILY  . apixaban (ELIQUIS) 5 MG TABS tablet Take 1 tablet (5 mg total) by mouth 2 (two) times daily.  Marland Kitchen atenolol (TENORMIN) 25 MG tablet TAKE ONE TABLET BY MOUTH DAILY  . cetirizine (ZYRTEC) 10 MG tablet Take 10 mg by mouth daily.  . cyclobenzaprine (FLEXERIL) 10 MG tablet Take 10 mg by mouth as needed for muscle spasms.  . furosemide (LASIX) 20 MG tablet Take 1 tablet (20 mg total) by mouth daily.  . hydroxypropyl methylcellulose / hypromellose (ISOPTO TEARS / GONIOVISC) 2.5 % ophthalmic solution Place 1 drop into both eyes as needed for dry eyes.  . pregabalin (LYRICA) 75 MG capsule Take 75 mg by mouth daily.  . simvastatin (ZOCOR) 40 MG tablet Take 1 tablet (40 mg total) by mouth at bedtime.  . sodium chloride (OCEAN) 0.65 % SOLN nasal spray Place 1 spray into both nostrils as needed for congestion.  . traMADol (ULTRAM) 50 MG tablet Take 2 tablets (100 mg total) by mouth every 12 (twelve) hours as needed.  . traZODone (DESYREL) 50 MG tablet TAKE ONE TABLET BY MOUTH EVERY NIGHT AT BEDTIME AS NEEDED FOR SLEEP  . [DISCONTINUED] Vitamin D, Ergocalciferol, (DRISDOL) 1.25 MG (50000 UNIT) CAPS capsule Take 50,000 Units by mouth once a week.  .  [DISCONTINUED] cyclobenzaprine (FLEXERIL) 10 MG tablet Take 1 tablet (10 mg total) by mouth 3 (three) times daily as needed for muscle spasms.  . [DISCONTINUED] primidone (MYSOLINE) 50 MG tablet TAKE 1 TABLET BY MOUTH TWICE A DAY   No facility-administered encounter medications on file as of 04/21/2022.    Past Medical History:  Diagnosis Date  . Anemia   . BCC (basal cell carcinoma of skin) 03/27/2006   Above Left Upper Lip (MOH's)  . Benign essential tremor   . Chronic kidney disease   . Essential hypertension, benign   . Fibromyalgia   . GERD (gastroesophageal reflux disease)    No longer a problem  . Heart murmur   . Hemorrhoids   . Hyponatremia    secondary to hctz-recurrent March 2010  . Low back pain    facet joint pain  . Occipital neuralgia   . Osteoarthritis   . Osteopenia   . Pneumonia   . Pure hypercholesterolemia 12/16/2013  . SCCA (squamous cell carcinoma) of skin 07/15/2002   Left Inner Cheek(in situ)  . SCCA (squamous cell carcinoma) of skin 05/06/2004   Right Wing Nose(in situ) -Cx3,5FU  . SCCA (squamous cell carcinoma) of skin 01/09/2011   Above Right Upper Lip(in situ)-Cx3  . SCCA (squamous cell carcinoma) of skin 01/05/2014   Left Temple(in situ)-Cx3,5FU  . SCCA (squamous cell carcinoma)  of skin 09/30/2014   Right Wrist(in situ)-tx p bx  . Shortness of breath dyspnea   . Sleep apnea    Mild sleep apnea  . Superficial basal cell carcinoma (BCC) 08/17/1982   Right Side Upper Lip  . Tuberculosis    as a child    Past Surgical History:  Procedure Laterality Date  . TOTAL KNEE ARTHROPLASTY Left 11/08/2015   Procedure: LEFT TOTAL KNEE ARTHROPLASTY;  Surgeon: Gaynelle Arabian, MD;  Location: [facility];  Service: Orthopedics;  Laterality: Left;  . TOTAL KNEE ARTHROPLASTY Right 12/19/2021   Procedure: TOTAL KNEE ARTHROPLASTY;  Surgeon: Gaynelle Arabian, MD;  Location: [facility];  Service: Orthopedics;  Laterality: Right;  . TUBAL LIGATION      Family History   Problem Relation Age of Onset  . Heart disease Father   . Prostate cancer Father   . Heart disease Mother   . Cancer - Ovarian Mother   . Congestive Heart Failure Mother   . Stroke Sister     Social History   Socioeconomic History  . Marital status: Divorced    Spouse name: Not on file  . Number of children: 2  . Years of education: Masters  . Highest education level: Not on file  Occupational History  . Occupation: Retired  Tobacco Use  . Smoking status: Never  . Smokeless tobacco: Never  Vaping Use  . Vaping Use: Never used  Substance and Sexual Activity  . Alcohol use: Yes    Alcohol/week: 7.0 standard drinks of alcohol    Types: 7 Standard drinks or equivalent per week    Comment: Cocktail daily  . Drug use: No  . Sexual activity: Not on file  Other Topics Concern  . Not on file  Social History Narrative   Denies caffeine use    Social Determinants of Health   Financial Resource Strain: Not on file  Food Insecurity: Not on file  Transportation Needs: Not on file  Physical Activity: Not on file  Stress: Not on file  Social Connections: Not on file  Intimate Partner Violence: Not on file    Review of Systems  All other systems reviewed and are negative.       Objective    BP 116/60 (BP Location: Right Arm, Patient Position: Sitting, Cuff Size: Normal)   Pulse 65   Temp 98 F (36.7 C) (Oral)   Ht 5' 4.5" (1.638 m)   Wt 168 lb (76.2 kg)   SpO2 95%   BMI 28.39 kg/m   Physical Exam Vitals reviewed.  Constitutional:      Appearance: Normal appearance. She is well-groomed and normal weight.  Eyes:     Conjunctiva/sclera: Conjunctivae normal.  Neck:     Thyroid: No thyromegaly.  Cardiovascular:     Rate and Rhythm: Normal rate and regular rhythm.     Pulses: Normal pulses.     Heart sounds: S1 normal and S2 normal.  Pulmonary:     Effort: Pulmonary effort is normal.     Breath sounds: Normal breath sounds and air entry.  Abdominal:      General: Bowel sounds are normal.  Musculoskeletal:     Right lower leg: No edema.     Left lower leg: No edema.  Neurological:     Mental Status: She is alert and oriented to person, place, and time. Mental status is at baseline.     Gait: Gait is intact.  Psychiatric:        Mood and  Affect: Mood and affect normal.        Speech: Speech normal.        Behavior: Behavior normal.        Judgment: Judgment normal.    {Labs (Optional):23779}    Assessment & Plan:   Problem List Items Addressed This Visit       Cardiovascular and Mediastinum   Essential hypertension, benign - Primary     Nervous and Auditory   Essential tremor     Other   Pure hypercholesterolemia   Fibromyalgia syndrome   Relevant Medications   cyclobenzaprine (FLEXERIL) 10 MG tablet    Return in about 4 months (around 08/22/2022) for follow up on sleep and DVT.   Farrel Conners, MD

## 2022-04-25 ENCOUNTER — Ambulatory Visit: Payer: PPO | Admitting: Neurology

## 2022-04-25 DIAGNOSIS — G4733 Obstructive sleep apnea (adult) (pediatric): Secondary | ICD-10-CM

## 2022-04-25 DIAGNOSIS — G250 Essential tremor: Secondary | ICD-10-CM

## 2022-04-25 DIAGNOSIS — F5101 Primary insomnia: Secondary | ICD-10-CM

## 2022-04-25 DIAGNOSIS — R251 Tremor, unspecified: Secondary | ICD-10-CM

## 2022-04-26 DIAGNOSIS — I82409 Acute embolism and thrombosis of unspecified deep veins of unspecified lower extremity: Secondary | ICD-10-CM | POA: Insufficient documentation

## 2022-04-26 NOTE — Assessment & Plan Note (Signed)
On simvastatin 40 mg daily, doing well with this medication, will continue as prescribed.

## 2022-04-26 NOTE — Assessment & Plan Note (Signed)
Current hypertension medications:      Sig   amLODipine (NORVASC) 5 MG tablet (Taking) TAKE ONE TABLET BY MOUTH DAILY   atenolol (TENORMIN) 25 MG tablet (Taking) TAKE ONE TABLET BY MOUTH DAILY   furosemide (LASIX) 20 MG tablet (Taking) Take 1 tablet (20 mg total) by mouth daily.     BP is well controlled today on the medications listed above, will continue these as prescribed.

## 2022-04-26 NOTE — Assessment & Plan Note (Signed)
On Eliquis 5 mg BID currently, s/p knee replacement, she will continue Eliquis for the recommended course. She continues to follow with the cardiolgist for this also.

## 2022-04-26 NOTE — Assessment & Plan Note (Signed)
On lyrica 75 mg daily and also uses cyclobenzaprine as needed, will continue this regimen. Pt reports sx are controlled at this time.

## 2022-04-26 NOTE — Progress Notes (Signed)
[facility]  [name] 81 year old female Aug 06, 1940   HOME SLEEP TEST REPORT ( by Watch PAT)   STUDY DATE:  04-19-22 ORDERING CLINICIAN: Melvyn Novas, MD  CLINICAL INFORMATION/HISTORY:       FINDINGS:   Sleep Summary:   Total Recording Time (hours, min):   6 h 53 m     Total Sleep Time (hours, min):   4 h 53 m              Percent REM (%):     15%                                   Respiratory Indices by CMS criteria   Calculated pAHI (per hour): 2.7/h   ( AASM  8/h)                           Oxygen Saturation Statistics:        O2 Saturation Range (%):    88% through 98%                                    O2 Saturation (minutes) <89%:  0 minutes           IMPRESSION:  This HST did not find any evidence of sleep disordered breathing. NO APNEA is seen.    RECOMMENDATION: no need for intervention    INTERPRETING PHYSICIAN:   Melvyn Novas, MD

## 2022-04-26 NOTE — Assessment & Plan Note (Signed)
Was controlled on primidone, however this was stopped when she was placed on anticoagulants. Will restart this medication when her eliquis course is completed sometime in January.

## 2022-05-24 ENCOUNTER — Telehealth: Payer: Self-pay | Admitting: Adult Health

## 2022-05-24 NOTE — Telephone Encounter (Signed)
Pt is calling. Requesting a call back to go over Sleep Study.

## 2022-05-26 ENCOUNTER — Telehealth: Payer: Self-pay | Admitting: Neurology

## 2022-05-26 DIAGNOSIS — G4733 Obstructive sleep apnea (adult) (pediatric): Secondary | ICD-10-CM

## 2022-05-26 DIAGNOSIS — R251 Tremor, unspecified: Secondary | ICD-10-CM

## 2022-05-26 DIAGNOSIS — F5101 Primary insomnia: Secondary | ICD-10-CM

## 2022-05-26 DIAGNOSIS — G250 Essential tremor: Secondary | ICD-10-CM

## 2022-05-26 NOTE — Telephone Encounter (Signed)
[facility] TEST REPORT ( by Watch PAT)   STUDY DATE:  04-25-2022 [name]   ORDERING CLINICIAN: Ward Givens, NP /  Larey Seat, MD  REFERRING CLINICIAN: Kelton Pillar, MD and cc :Dr Benn Moulder   CLINICAL INFORMATION/HISTORY: [name] is an 81 year old female with a history of essential tremor, insomnia and obstructive sleep apnea.  She returns today for follow-up.   Essential tremor: Primidone has helped. Still notices the tremor with handwriting.  Reports that she is now not as reluctant to eat in public since the tremor is better.  She is wondering if a slight increase in the dose will give her further benefit.   Insomnia: using trazodone and that has been helpful.    Obstructive sleep apnea: Reports that she was never called to get her CPAP machine.  However per notes from [facility] they did try to reach out to the patient-  Nevertheless the patient would like to get started on CPAP therapy    Epworth sleepiness score: na/24.   BMI: 27. 3 kg/m    FINDINGS:   Sleep Summary:   Total Recording Time (hours, min): 6 hours and 53 minutes      Total Sleep Time (hours, min):      4 hours and 53 minutes           Percent REM (%):   15%                                     Respiratory Indices:   Calculated pAHI (per hour): 8/h                           REM pAHI: 33/h                                               NREM pAHI:     3.6/h                         Positional AHI:   Supine sleep was associated with an AHI of 9.8/h and nonsupine sleep with an AHI of 1/h.        30% of the total recording time were associated with snoring.                                          Oxygen Saturation Statistics:         O2 Saturation Range (%):     Between a nadir of 88% and a maximum of 98% with a mean oxygen saturation at 94%.                                  O2 Saturation (minutes) <89%:    0 minutes       Pulse Rate Statistics:        Pulse  Range:       Between 59 and 92 bpm and the mean heart rate of 68 bpm.  IMPRESSION:  This HST confirms the presence of very mild obstructive sleep apnea, but in a REM sleep dependent form.  Please note that all REM sleep took place by the patient slept in supine position, and is therefore impossible to distinguish if REM sleep alone- in a different position -would also lead to the same apnea hypopnea index.   RECOMMENDATION: The patient should avoid sleeping on her back, she should maintain a healthy body mass index, and establish good sleep hygiene. An auto titrating ResMed CPAP device should be used at a pressure setting between 5 and 12 cm of water pressure with 2 cm H20 EPR, with a mask that the patient finds comfortable, and with heated humidification.  This patient may be responsive to a nasal interface, nasal pillow or nasal cradle. Rv with [place]p [place] between 30- 89 days of therapy.     INTERPRETING PHYSICIAN:   Larey Seat, MD   Medical Director of [facility].

## 2022-05-27 NOTE — Progress Notes (Deleted)
Office Visit    Patient Name: [name] Date of Encounter: 05/27/2022  Primary Care Provider:  Farrel Conners, MD Primary Cardiologist:  Larae Grooms, MD Primary Electrophysiologist: None  Chief Complaint    [name] is a 81 y.o. female with PMH of HLD, obstructive sleep apnea, HTN who presents today for 2 month follow-up of recent DVT.   Past Medical History    Past Medical History:  Diagnosis Date   Anemia    BCC (basal cell carcinoma of skin) 03/27/2006   Above Left Upper Lip (MOH's)   Benign essential tremor    Chronic kidney disease    Essential hypertension, benign    Fibromyalgia    GERD (gastroesophageal reflux disease)    No longer a problem   Heart murmur    Hemorrhoids    Hyponatremia    secondary to hctz-recurrent March 2010   Low back pain    facet joint pain   Occipital neuralgia    Osteoarthritis    Osteopenia    Pneumonia    Pure hypercholesterolemia 12/16/2013   SCCA (squamous cell carcinoma) of skin 07/15/2002   Left Inner Cheek(in situ)   SCCA (squamous cell carcinoma) of skin 05/06/2004   Right Wing Nose(in situ) -Cx3,5FU   SCCA (squamous cell carcinoma) of skin 01/09/2011   Above Right Upper Lip(in situ)-Cx3   SCCA (squamous cell carcinoma) of skin 01/05/2014   Left Temple(in situ)-Cx3,5FU   SCCA (squamous cell carcinoma) of skin 09/30/2014   Right Wrist(in situ)-tx p bx   Shortness of breath dyspnea    Sleep apnea    Mild sleep apnea   Superficial basal cell carcinoma (BCC) 08/17/1982   Right Side Upper Lip   Tuberculosis    as a child   Past Surgical History:  Procedure Laterality Date   TOTAL KNEE ARTHROPLASTY Left 11/08/2015   Procedure: LEFT TOTAL KNEE ARTHROPLASTY;  Surgeon: Gaynelle Arabian, MD;  Location: [facility];  Service: Orthopedics;  Laterality: Left;   TOTAL KNEE ARTHROPLASTY Right 12/19/2021   Procedure: TOTAL KNEE ARTHROPLASTY;  Surgeon: Gaynelle Arabian, MD;  Location: [facility];  Service: Orthopedics;   Laterality: Right;   TUBAL LIGATION      Allergies  Allergies  Allergen Reactions   Propoxyphene Other (See Comments)   Erythromycin Base Other (See Comments)   Hctz [Hydrochlorothiazide] Other (See Comments)    Hyponatremia    Lisinopril Other (See Comments)    Hyponatremia   Sulfa Antibiotics Rash    History of Present Illness    [name]  is a 81 year old female with the above mention past medical history who presents today for 2-week follow-up for recent DVT.  She was initially seen by Dr. Irish Lack on 12/2013 for preoperative clearance of total knee replacement.  Lexiscan Myoview was completed and showed no ischemia with normal heart function.  Most recent 2D echo was completed 01/2021 showing hyperdynamic EF of 75%, no RWMA, grade 1 DD mildly dilated LA with normal valve function.  She was last seen by Dr.Varanasi on 03/2021 for follow-up.  During visit patient was doing well and participating in exercise program at [facility] for fibromyalgia.  She had no cardiac symptoms to report at that time.  No medication changes were made during visit.  She was seen 2 weeks ago with complaint of lower extremity edema and shortness of breath.  She previously had total knee replacement completed by Ortho care and presented to the ED where DVT was  ruled out by ultrasound.  Patient had not had D-dimer completed and I ordered stat D-dimer that came back positive.  Patient was sent for stat right lower extremity Doppler that was positive for DVT.  She was seen same day by Dr. Sallyanne Kuster and was placed on Eliquis 5 mg twice daily.  She was seen in follow-up 9/22 weeks later and reported resolution of her leg pain and chest pain.  There was still redness and swelling in the right lower leg but no reduced range of motion.  She was continuing to exercise at her [facility] fibromyalgia program class.  She had no adverse reactions to her Eliquis.  Since last being seen in the office patient reports***.  Patient denies chest  pain, palpitations, dyspnea, PND, orthopnea, nausea, vomiting, dizziness, syncope, edema, weight gain, or early satiety.   ***Notes:  Home Medications    Current Outpatient Medications  Medication Sig Dispense Refill   amLODipine (NORVASC) 5 MG tablet TAKE ONE TABLET BY MOUTH DAILY 90 tablet 2   apixaban (ELIQUIS) 5 MG TABS tablet Take 1 tablet (5 mg total) by mouth 2 (two) times daily. 60 tablet 2   atenolol (TENORMIN) 25 MG tablet TAKE ONE TABLET BY MOUTH DAILY 90 tablet 1   cetirizine (ZYRTEC) 10 MG tablet Take 10 mg by mouth daily.     cyclobenzaprine (FLEXERIL) 10 MG tablet Take 10 mg by mouth as needed for muscle spasms.     furosemide (LASIX) 20 MG tablet Take 1 tablet (20 mg total) by mouth daily. 45 tablet 3   hydroxypropyl methylcellulose / hypromellose (ISOPTO TEARS / GONIOVISC) 2.5 % ophthalmic solution Place 1 drop into both eyes as needed for dry eyes.     pregabalin (LYRICA) 75 MG capsule Take 75 mg by mouth daily.     simvastatin (ZOCOR) 40 MG tablet Take 1 tablet (40 mg total) by mouth at bedtime. 90 tablet 2   sodium chloride (OCEAN) 0.65 % SOLN nasal spray Place 1 spray into both nostrils as needed for congestion.     traMADol (ULTRAM) 50 MG tablet Take 2 tablets (100 mg total) by mouth every 12 (twelve) hours as needed. 10 tablet 0   traZODone (DESYREL) 50 MG tablet TAKE ONE TABLET BY MOUTH EVERY NIGHT AT BEDTIME AS NEEDED FOR SLEEP 30 tablet 4   No current facility-administered medications for this visit.     Review of Systems  Please see the history of present illness.    (+)*** (+)***  All other systems reviewed and are otherwise negative except as noted above.  Physical Exam    Wt Readings from Last 3 Encounters:  04/21/22 168 lb (76.2 kg)  03/29/22 171 lb 3.2 oz (77.7 kg)  03/17/22 166 lb (75.3 kg)   BB:CWUGQ were no vitals filed for this visit.,There is no height or weight on file to calculate BMI.  Constitutional:      Appearance: Healthy  appearance. Not in distress.  Neck:     Vascular: JVD normal.  Pulmonary:     Effort: Pulmonary effort is normal.     Breath sounds: No wheezing. No rales. Diminished in the bases Cardiovascular:     Normal rate. Regular rhythm. Normal S1. Normal S2.      Murmurs: There is no murmur.  Edema:    Peripheral edema absent.  Abdominal:     Palpations: Abdomen is soft non tender. There is no hepatomegaly.  Skin:    General: Skin is warm and dry.  Neurological:  General: No focal deficit present.     Mental Status: Alert and oriented to person, place and time.     Cranial Nerves: Cranial nerves are intact.  EKG/LABS/Other Studies Reviewed    ECG personally reviewed by me today - ***  Risk Assessment/Calculations:   {Does this patient have ATRIAL FIBRILLATION?:[id number]}        Lab Results  Component Value Date   WBC 11.2 (H) 12/20/2021   HGB 10.2 (L) 12/20/2021   HCT 30.2 (L) 12/20/2021   MCV 95.9 12/20/2021   PLT 192 12/20/2021   Lab Results  Component Value Date   CREATININE 1.00 04/07/2022   BUN 11 04/07/2022   NA 138 04/07/2022   K 4.7 04/07/2022   CL 100 04/07/2022   CO2 25 04/07/2022   Lab Results  Component Value Date   ALT 16 12/13/2021   AST 24 12/13/2021   ALKPHOS 92 12/13/2021   BILITOT 0.8 12/13/2021   No results found for: "CHOL", "HDL", "LDLCALC", "LDLDIRECT", "TRIG", "CHOLHDL"  No results found for: "HGBA1C"  Assessment & Plan    1.  Dyspnea on exertion: -Patient presents for 2-week follow-up with complaint of dyspnea on exertion. -Today patient reported no shortness of breath or chest discomfort -Patient has resumed physical activity and advised to report any decreased exercise tolerance or shortness of breath   2.  History of DVT: -Patient had recent lower extremity ultrasound completed revealing DVT.  She was placed on Eliquis 5 mg twice daily for next 3 months -Today patient reports no chest pain or lower extremity pain -She is  tolerating Eliquis with no occult bleeding -Current Eliquis dose appropriate based on most recent creatinine of 1.15   3.  Hypertension: -Blood pressure today was well controlled at 130/74 -Continue atenolol 25 mg daily, and amlodipine 5 mg daily -Continue low-sodium heart healthy diet   4.  Hyperlipidemia: -Patient's last LDL cholesterol was 60 -Continue Zocor 40 mg daily     Disposition: Follow-up with Larae Grooms, MD or APP in *** months {Are you ordering a CV Procedure (e.g. stress test, cath, DCCV, TEE, etc)?   Press F2        :[id number]}   Medication Adjustments/Labs and Tests Ordered: Current medicines are reviewed at length with the patient today.  Concerns regarding medicines are outlined above.   Signed, Mable Fill, Marissa Nestle, NP 05/27/2022, 2:13 PM [facility]  Note:  This document was prepared using Dragon voice recognition software and may include unintentional dictation errors.

## 2022-05-29 ENCOUNTER — Ambulatory Visit: Payer: PPO | Admitting: Nurse Practitioner

## 2022-05-29 DIAGNOSIS — I10 Essential (primary) hypertension: Secondary | ICD-10-CM

## 2022-05-30 NOTE — Telephone Encounter (Signed)
Spoke with patient and discussed her sleep study results. Apologized for the delay. The patient is amenable to trying autopap. She is interested in a nasal interface. We discussed options for DME. She ultimately doesn't have a preference. We decided to try [facility]. She will watch for a call from them within 1 week. If she doesn't hear, she will call. Pt understands the insurance compliance requirements which includes using the machine at least 4 hours at night and also being seen here at [facility] for an initial follow-up between 30 and 90 days after setup. She will receive a separate call to schedule. Pt advised she is taking melatonin 20 mg at night. She started with 10 mg. She also said she gets sleepy in the afternoons and will rest for a couple hours, about 50% of the time she will fall asleep. Patient's questions were answered and she verbalized appreciation for the call.  I emailed the pt a sleep hygiene tip sheet. Autopap referral faxed to [place]. Received a receipt of confirmation.

## 2022-06-08 DIAGNOSIS — M25561 Pain in right knee: Secondary | ICD-10-CM | POA: Diagnosis not present

## 2022-06-08 DIAGNOSIS — R269 Unspecified abnormalities of gait and mobility: Secondary | ICD-10-CM | POA: Diagnosis not present

## 2022-06-12 DIAGNOSIS — R269 Unspecified abnormalities of gait and mobility: Secondary | ICD-10-CM | POA: Diagnosis not present

## 2022-06-12 DIAGNOSIS — M25561 Pain in right knee: Secondary | ICD-10-CM | POA: Diagnosis not present

## 2022-06-14 NOTE — Progress Notes (Unsigned)
Office Visit    Patient Name: [name] Date of Encounter: 06/15/2022  Primary Care Provider:  Farrel Conners, MD Primary Cardiologist:  Larae Grooms, MD Primary Electrophysiologist: None  Chief Complaint    [name] is a 81 y.o. female with PMH of HLD, obstructive sleep apnea, HTN who presents today for 2 month follow-up of hypertension and recent DVT.  Past Medical History    Past Medical History:  Diagnosis Date   Anemia    BCC (basal cell carcinoma of skin) 03/27/2006   Above Left Upper Lip (MOH's)   Benign essential tremor    Chronic kidney disease    Essential hypertension, benign    Fibromyalgia    GERD (gastroesophageal reflux disease)    No longer a problem   Heart murmur    Hemorrhoids    Hyponatremia    secondary to hctz-recurrent March 2010   Low back pain    facet joint pain   Occipital neuralgia    Osteoarthritis    Osteopenia    Pneumonia    Pure hypercholesterolemia 12/16/2013   SCCA (squamous cell carcinoma) of skin 07/15/2002   Left Inner Cheek(in situ)   SCCA (squamous cell carcinoma) of skin 05/06/2004   Right Wing Nose(in situ) -Cx3,5FU   SCCA (squamous cell carcinoma) of skin 01/09/2011   Above Right Upper Lip(in situ)-Cx3   SCCA (squamous cell carcinoma) of skin 01/05/2014   Left Temple(in situ)-Cx3,5FU   SCCA (squamous cell carcinoma) of skin 09/30/2014   Right Wrist(in situ)-tx p bx   Shortness of breath dyspnea    Sleep apnea    Mild sleep apnea   Superficial basal cell carcinoma (BCC) 08/17/1982   Right Side Upper Lip   Tuberculosis    as a child   Past Surgical History:  Procedure Laterality Date   TOTAL KNEE ARTHROPLASTY Left 11/08/2015   Procedure: LEFT TOTAL KNEE ARTHROPLASTY;  Surgeon: Gaynelle Arabian, MD;  Location: [facility];  Service: Orthopedics;  Laterality: Left;   TOTAL KNEE ARTHROPLASTY Right 12/19/2021   Procedure: TOTAL KNEE ARTHROPLASTY;  Surgeon: Gaynelle Arabian, MD;  Location: [facility];  Service:  Orthopedics;  Laterality: Right;   TUBAL LIGATION      Allergies  Allergies  Allergen Reactions   Propoxyphene Other (See Comments)   Erythromycin Base Other (See Comments)   Hctz [Hydrochlorothiazide] Other (See Comments)    Hyponatremia    Lisinopril Other (See Comments)    Hyponatremia   Sulfa Antibiotics Rash    History of Present Illness     [name]  is a 81 year old female with the above mention past medical history who presents today for 2-week follow-up for recent DVT.  She was initially seen by Dr. Irish Lack on 12/2013 for preoperative clearance of total knee replacement.  Lexiscan Myoview was completed and showed no ischemia with normal heart function.  Most recent 2D echo was completed 01/2021 showing hyperdynamic EF of 75%, no RWMA, grade 1 DD mildly dilated LA with normal valve function.  She was last seen by Dr.Varanasi on 03/2021 for follow-up.  During visit patient was doing well and participating in exercise program at [facility] for fibromyalgia.  She had no cardiac symptoms to report at that time.  No medication changes were made during visit.  She was seen 2 weeks ago with complaint of lower extremity edema and shortness of breath.  She previously had total knee replacement completed by Ortho care and presented to the ED where  DVT was ruled out by ultrasound.  Patient had not had D-dimer completed and I ordered stat D-dimer that came back positive.  Patient was sent for stat right lower extremity Doppler that was positive for DVT.  She was seen same day by Dr. Sallyanne Kuster and was placed on Eliquis 5 mg twice daily.  She was last seen on 03/29/22 and reported resolution of chest pain and leg pain.  She still had some redness of her right lower leg but endorsed no significant pain.  She was tolerating her Eliquis without any occult bleeding in stool or urine.  [name] presents today for 54-monthfollow-up.  Since last being seen in the office patient reports she is doing well with no  recurrence of right lower extremity pain.  She reports that her leg discomfort has resolved and on examination today her lower extremity is free of any swelling or erythema.  Her blood pressure today was well-controlled at 120/58 and heart rate was 86 bpm.  She is compliant with her current medications and denies any adverse reactions.  She is currently on her last month of Eliquis and will discontinue following this prescription.  [facility]  During our visit today we discussed the pathophysiology of heart failure and we reviewed her past echo report and discussed the findings.  She was concerned regarding congestive heart failure and is currently free of any symptoms.  She is euvolemic on exam today and reports no indiscretions with excess salt..  Patient denies chest pain, palpitations, dyspnea, PND, orthopnea, nausea, vomiting, dizziness, syncope, edema, weight gain, or early satiety.   Home Medications    Current Outpatient Medications  Medication Sig Dispense Refill   amLODipine (NORVASC) 5 MG tablet TAKE ONE TABLET BY MOUTH DAILY 90 tablet 2   apixaban (ELIQUIS) 5 MG TABS tablet Take 1 tablet (5 mg total) by mouth 2 (two) times daily. 60 tablet 2   atenolol (TENORMIN) 25 MG tablet TAKE ONE TABLET BY MOUTH DAILY 90 tablet 1   cetirizine (ZYRTEC) 10 MG tablet Take 10 mg by mouth daily.     cyclobenzaprine (FLEXERIL) 10 MG tablet Take 10 mg by mouth as needed for muscle spasms.     furosemide (LASIX) 20 MG tablet Take 1 tablet (20 mg total) by mouth daily. 45 tablet 3   hydroxypropyl methylcellulose / hypromellose (ISOPTO TEARS / GONIOVISC) 2.5 % ophthalmic solution Place 1 drop into both eyes as needed for dry eyes.     Melatonin 10 MG TABS Take 20 mg by mouth at bedtime.     pregabalin (LYRICA) 75 MG capsule Take 75 mg by mouth daily.     simvastatin (ZOCOR) 40 MG tablet Take 1 tablet (40 mg total) by mouth at bedtime. 90 tablet 2    sodium chloride (OCEAN) 0.65 % SOLN nasal spray Place 1 spray into both nostrils as needed for congestion.     traMADol (ULTRAM) 50 MG tablet Take 2 tablets (100 mg total) by mouth every 12 (twelve) hours as needed. 10 tablet 0   traZODone (DESYREL) 50 MG tablet TAKE ONE TABLET BY MOUTH EVERY NIGHT AT BEDTIME AS NEEDED FOR SLEEP 30 tablet 4   No current facility-administered medications for this visit.     Review of Systems  Please see the history of present illness.      All other systems reviewed and are otherwise negative except as noted above.  Physical Exam  Wt Readings from Last 3 Encounters:  06/15/22 171 lb 12.8 oz (77.9 kg)  04/21/22 168 lb (76.2 kg)  03/29/22 171 lb 3.2 oz (77.7 kg)   VS: Vitals:   06/15/22 1328  BP: (!) 120/58  Pulse: 86  SpO2: 97%  ,Body mass index is 28.59 kg/m.  Constitutional:      Appearance: Healthy appearance. Not in distress.  Neck:     Vascular: JVD normal.  Pulmonary:     Effort: Pulmonary effort is normal.     Breath sounds: No wheezing. No rales. Diminished in the bases Cardiovascular:     Normal rate. Regular rhythm. Normal S1. Normal S2.      Murmurs: There is no murmur.  Edema:    Peripheral edema absent.  Abdominal:     Palpations: Abdomen is soft non tender. There is no hepatomegaly.  Skin:    General: Skin is warm and dry.  Neurological:     General: No focal deficit present.     Mental Status: Alert and oriented to person, place and time.     Cranial Nerves: Cranial nerves are intact.  EKG/LABS/Other Studies Reviewed    ECG personally reviewed by me today -none completed today    Lab Results  Component Value Date   WBC 11.2 (H) 12/20/2021   HGB 10.2 (L) 12/20/2021   HCT 30.2 (L) 12/20/2021   MCV 95.9 12/20/2021   PLT 192 12/20/2021   Lab Results  Component Value Date   CREATININE 1.00 04/07/2022   BUN 11 04/07/2022   NA 138 04/07/2022   K 4.7 04/07/2022   CL 100 04/07/2022   CO2 25 04/07/2022    Lab Results  Component Value Date   ALT 16 12/13/2021   AST 24 12/13/2021   ALKPHOS 92 12/13/2021   BILITOT 0.8 12/13/2021   No results found for: "CHOL", "HDL", "LDLCALC", "LDLDIRECT", "TRIG", "CHOLHDL"  No results found for: "HGBA1C"  Assessment & Plan    1.  Dyspnea on exertion: -Today patient reports resolution of shortness of breath since previous visit.   2.  History of DVT: -Patient had recent lower extremity ultrasound completed revealing DVT.  She was placed on Eliquis 5 mg twice daily for next 3 months -Today patient reports no chest pain or lower extremity pain -She we will discontinue Eliquis 5 mg twice daily following this present prescription. -She is fine to resume her primidone following her Eliquis prescription.   3.  Hypertension: -Blood pressure today was well controlled at 120/58 -Continue atenolol 25 mg daily, and amlodipine 5 mg daily -Continue low-sodium heart healthy diet   4.  Hyperlipidemia: -Patient's last LDL cholesterol was 60 -Continue Zocor 40 mg daily  Disposition: Follow-up with Larae Grooms, MD as needed  Medication Adjustments/Labs and Tests Ordered: Current medicines are reviewed at length with the patient today.  Concerns regarding medicines are outlined above.   Signed, Mable Fill, Marissa Nestle, NP 06/15/2022, 1:56 PM [facility]  Note:  This document was prepared using Dragon voice recognition software and may include unintentional dictation errors.

## 2022-06-15 ENCOUNTER — Ambulatory Visit: Payer: PPO | Attending: Nurse Practitioner | Admitting: Nurse Practitioner

## 2022-06-15 ENCOUNTER — Encounter: Payer: Self-pay | Admitting: Nurse Practitioner

## 2022-06-15 VITALS — BP 120/58 | HR 86 | Ht 65.0 in | Wt 171.8 lb

## 2022-06-15 DIAGNOSIS — R0609 Other forms of dyspnea: Secondary | ICD-10-CM | POA: Diagnosis not present

## 2022-06-15 DIAGNOSIS — I10 Essential (primary) hypertension: Secondary | ICD-10-CM | POA: Diagnosis not present

## 2022-06-15 DIAGNOSIS — M25561 Pain in right knee: Secondary | ICD-10-CM | POA: Diagnosis not present

## 2022-06-15 DIAGNOSIS — R269 Unspecified abnormalities of gait and mobility: Secondary | ICD-10-CM | POA: Diagnosis not present

## 2022-06-15 DIAGNOSIS — E7800 Pure hypercholesterolemia, unspecified: Secondary | ICD-10-CM | POA: Diagnosis not present

## 2022-06-15 DIAGNOSIS — I82409 Acute embolism and thrombosis of unspecified deep veins of unspecified lower extremity: Secondary | ICD-10-CM | POA: Diagnosis not present

## 2022-06-15 NOTE — Patient Instructions (Addendum)
Medication Instructions:  STOP Eliquis after you take the last pill in your medication bottle. OK TO RESTART PRIMIDONE after finishing with Eliquis *If you need a refill on your cardiac medications before your next appointment, please call your pharmacy*   Lab Work: None ordered   Testing/Procedures: None ordered   Follow-Up: At [facility], you and your health needs are our priority.  As part of our continuing mission to provide you with exceptional heart care, we have created designated Provider Care Teams.  These Care Teams include your primary Cardiologist (physician) and Advanced Practice Providers (APPs -  Physician Assistants and Nurse Practitioners) who all work together to provide you with the care you need, when you need it.  We recommend signing up for the patient portal called "MyChart".  Sign up information is provided on this After Visit Summary.  MyChart is used to connect with patients for Virtual Visits (Telemedicine).  Patients are able to view lab/test results, encounter notes, upcoming appointments, etc.  Non-urgent messages can be sent to your provider as well.   To learn more about what you can do with MyChart, go to [email or web address].    Your next appointment:   AS NEEDED  The format for your next appointment:   In Person  Provider:   Larae Grooms, MD     Other Instructions   Important Information About Sugar

## 2022-06-20 ENCOUNTER — Telehealth: Payer: Self-pay | Admitting: Adult Health

## 2022-06-20 NOTE — Telephone Encounter (Signed)
This pt has been on primidone and eliquis for a some time.   Primidone since 06-09-2020 by Dr. Brett Fairy.  03-17-2022 Eliquis by cardiology.  Per pt aware and has not taken primidone with eliquis as instructed by cardiology.  Saw Dr. Barbarann Ehlers and stopped eliquis 06-18-2022, so is now able to restart primidone. I relayed to [place] [facility] [place] for her that aware and pt is not taking together.  I relayed to pt that her asking for a renewal of primidone initiated this.  Pt was appreciative.  Estill Bamberg to call back if questions.

## 2022-06-20 NOTE — Telephone Encounter (Signed)
[name] is calling. Stated she wants to talk to nurse about filling prescription Primidone. Stated pt is taking apixaban (ELIQUIS) 5 MG TABS tablet  and the two shouldn't be taking at same time  .

## 2022-06-22 DIAGNOSIS — R269 Unspecified abnormalities of gait and mobility: Secondary | ICD-10-CM | POA: Diagnosis not present

## 2022-06-22 DIAGNOSIS — M25561 Pain in right knee: Secondary | ICD-10-CM | POA: Diagnosis not present

## 2022-06-26 ENCOUNTER — Ambulatory Visit: Payer: PPO | Admitting: Dermatology

## 2022-06-26 DIAGNOSIS — M25561 Pain in right knee: Secondary | ICD-10-CM | POA: Diagnosis not present

## 2022-06-26 DIAGNOSIS — R269 Unspecified abnormalities of gait and mobility: Secondary | ICD-10-CM | POA: Diagnosis not present

## 2022-06-28 ENCOUNTER — Telehealth: Payer: Self-pay | Admitting: Family Medicine

## 2022-06-28 DIAGNOSIS — M797 Fibromyalgia: Secondary | ICD-10-CM

## 2022-06-28 DIAGNOSIS — M1711 Unilateral primary osteoarthritis, right knee: Secondary | ICD-10-CM

## 2022-06-28 NOTE — Telephone Encounter (Signed)
Patient requesting traMADol (ULTRAM) 50 MG tablet  pregabalin (LYRICA) 75 MG capsule [facility] [id number] - [place], [place] - [address] Phone: [phone]  Fax: [phone]

## 2022-06-29 DIAGNOSIS — R269 Unspecified abnormalities of gait and mobility: Secondary | ICD-10-CM | POA: Diagnosis not present

## 2022-06-29 DIAGNOSIS — M25561 Pain in right knee: Secondary | ICD-10-CM | POA: Diagnosis not present

## 2022-06-29 MED ORDER — PREGABALIN 75 MG PO CAPS: 75.0000 mg | ORAL_CAPSULE | Freq: Every day | ORAL | 0 refills | Status: DC

## 2022-06-29 MED ORDER — TRAMADOL HCL 50 MG PO TABS: 100.0000 mg | ORAL_TABLET | Freq: Two times a day (BID) | ORAL | 2 refills | Status: DC | PRN

## 2022-06-29 NOTE — Addendum Note (Signed)
Addended by: Farrel Conners on: 06/29/2022 10:33 AM   Modules accepted: Orders

## 2022-06-29 NOTE — Telephone Encounter (Signed)
Rx sent 

## 2022-07-13 ENCOUNTER — Telehealth: Payer: PPO | Admitting: Family Medicine

## 2022-07-20 DIAGNOSIS — M25561 Pain in right knee: Secondary | ICD-10-CM | POA: Diagnosis not present

## 2022-07-20 DIAGNOSIS — R269 Unspecified abnormalities of gait and mobility: Secondary | ICD-10-CM | POA: Diagnosis not present

## 2022-07-26 ENCOUNTER — Telehealth: Payer: Self-pay | Admitting: *Deleted

## 2022-07-26 DIAGNOSIS — R269 Unspecified abnormalities of gait and mobility: Secondary | ICD-10-CM | POA: Diagnosis not present

## 2022-07-26 DIAGNOSIS — I10 Essential (primary) hypertension: Secondary | ICD-10-CM

## 2022-07-26 DIAGNOSIS — M25561 Pain in right knee: Secondary | ICD-10-CM | POA: Diagnosis not present

## 2022-07-26 MED ORDER — AMLODIPINE BESYLATE 5 MG PO TABS: 5.0000 mg | ORAL_TABLET | Freq: Every day | ORAL | 2 refills | Status: DC

## 2022-07-26 NOTE — Telephone Encounter (Signed)
[name] faxed a refill request for Amlodipine '5mg'$ -take l tablet by mouth daily-#90.  Message sent to PCP as last Rx was given by cardiology.

## 2022-07-26 NOTE — Telephone Encounter (Signed)
Rx sent 

## 2022-08-02 ENCOUNTER — Telehealth: Payer: Self-pay | Admitting: Family Medicine

## 2022-08-02 NOTE — Telephone Encounter (Signed)
Returned patients call  left message asking pt to call me back [phone]   Patient left message 08/01/22 to r/s app

## 2022-08-03 DIAGNOSIS — M25561 Pain in right knee: Secondary | ICD-10-CM | POA: Diagnosis not present

## 2022-08-03 DIAGNOSIS — R269 Unspecified abnormalities of gait and mobility: Secondary | ICD-10-CM | POA: Diagnosis not present

## 2022-08-04 ENCOUNTER — Ambulatory Visit (INDEPENDENT_AMBULATORY_CARE_PROVIDER_SITE_OTHER): Payer: PPO

## 2022-08-04 VITALS — BP 128/62 | HR 74 | Temp 98.7°F | Ht 65.0 in | Wt 166.8 lb

## 2022-08-04 DIAGNOSIS — Z0000 Encounter for general adult medical examination without abnormal findings: Secondary | ICD-10-CM | POA: Diagnosis not present

## 2022-08-04 NOTE — Patient Instructions (Addendum)
[name] , Thank you for taking time to come for your Medicare Wellness Visit. I appreciate your ongoing commitment to your health goals. Please review the following plan we discussed and let me know if I can assist you in the future.   These are the goals we discussed:  Goals       No current goals (pt-stated)        This is a list of the screening recommended for you and due dates:  Health Maintenance  Topic Date Due   DTaP/Tdap/Td vaccine (1 - Tdap) Never done   COVID-19 Vaccine (4 - 2023-24 season) 08/20/2022*   Flu Shot  10/09/2022*   Zoster (Shingles) Vaccine (1 of 2) 11/03/2022*   Pneumonia Vaccine (1 - PCV) 08/05/2023*   Medicare Annual Wellness Visit  08/05/2023   DEXA scan (bone density measurement)  Completed   HPV Vaccine  Aged Out  *Topic was postponed. The date shown is not the original due date.  Opioid Pain Medicine Management Opioids are powerful medicines that are used to treat moderate to severe pain. When used for short periods of time, they can help you to: Sleep better. Do better in physical or occupational therapy. Feel better in the first few days after an injury. Recover from surgery. Opioids should be taken with the supervision of a trained health care provider. They should be taken for the shortest period of time possible. This is because opioids can be addictive, and the longer you take opioids, the greater your risk of addiction. This addiction can also be called opioid use disorder. What are the risks? Using opioid pain medicines for longer than 3 days increases your risk of side effects. Side effects include: Constipation. Nausea and vomiting. Breathing difficulties (respiratory depression). Drowsiness. Confusion. Opioid use disorder. Itching. Taking opioid pain medicine for a long period of time can affect your ability to do daily tasks. It also puts you at risk for: Motor vehicle crashes. Depression. Suicide. Heart attack. Overdose, which  can be life-threatening. What is a pain treatment plan? A pain treatment plan is an agreement between you and your health care provider. Pain is unique to each person, and treatments vary depending on your condition. To manage your pain, you and your health care provider need to work together. To help you do this: Discuss the goals of your treatment, including how much pain you might expect to have and how you will manage the pain. Review the risks and benefits of taking opioid medicines. Remember that a good treatment plan uses more than one approach and minimizes the chance of side effects. Be honest about the amount of medicines you take and about any drug or alcohol use. Get pain medicine prescriptions from only one health care provider. Pain can be managed with many types of alternative treatments. Ask your health care provider to refer you to one or more specialists who can help you manage pain through: Physical or occupational therapy. Counseling (cognitive behavioral therapy). Good nutrition. Biofeedback. Massage. Meditation. Non-opioid medicine. Following a gentle exercise program. How to use opioid pain medicine Taking medicine Take your pain medicine exactly as told by your health care provider. Take it only when you need it. If your pain gets less severe, you may take less than your prescribed dose if your health care provider approves. If you are not having pain, do nottake pain medicine unless your health care provider tells you to take it. If your pain is severe, do nottry to treat it  yourself by taking more pills than instructed on your prescription. Contact your health care provider for help. Write down the times when you take your pain medicine. It is easy to become confused while on pain medicine. Writing the time can help you avoid overdose. Take other over-the-counter or prescription medicines only as told by your health care provider. Keeping yourself and others  safe  While you are taking opioid pain medicine: Do not drive, use machinery, or power tools. Do not sign legal documents. Do not drink alcohol. Do not take sleeping pills. Do not supervise children by yourself. Do not do activities that require climbing or being in high places. Do not go to a lake, river, ocean, spa, or swimming pool. Do not share your pain medicine with anyone. Keep pain medicine in a locked cabinet or in a secure area where pets and children cannot reach it. Stopping your use of opioids If you have been taking opioid medicine for more than a few weeks, you may need to slowly decrease (taper) how much you take until you stop completely. Tapering your use of opioids can decrease your risk of symptoms of withdrawal, such as: Pain and cramping in the abdomen. Nausea. Sweating. Sleepiness. Restlessness. Uncontrollable shaking (tremors). Cravings for the medicine. Do not attempt to taper your use of opioids on your own. Talk with your health care provider about how to do this. Your health care provider may prescribe a step-down schedule based on how much medicine you are taking and how long you have been taking it. Getting rid of leftover pills Do not save any leftover pills. Get rid of leftover pills safely by: Taking the medicine to a prescription take-back program. This is usually offered by the county or law enforcement. Bringing them to a pharmacy that has a drug disposal container. Flushing them down the toilet. Check the label or package insert of your medicine to see whether this is safe to do. Throwing them out in the trash. Check the label or package insert of your medicine to see whether this is safe to do. If it is safe to throw it out, remove the medicine from the original container, put it into a sealable bag or container, and mix it with used coffee grounds, food scraps, dirt, or cat litter before putting it in the trash. Follow these instructions at  home: Activity Do exercises as told by your health care provider. Avoid activities that make your pain worse. Return to your normal activities as told by your health care provider. Ask your health care provider what activities are safe for you. General instructions You may need to take these actions to prevent or treat constipation: Drink enough fluid to keep your urine pale yellow. Take over-the-counter or prescription medicines. Eat foods that are high in fiber, such as beans, whole grains, and fresh fruits and vegetables. Limit foods that are high in fat and processed sugars, such as fried or sweet foods. Keep all follow-up visits. This is important. Where to find support If you have been taking opioids for a long time, you may benefit from receiving support for quitting from a local support group or counselor. Ask your health care provider for a referral to these resources in your area. Where to find more information Centers for Disease Control and Prevention (CDC): http://www.wolf.info/ [place] Food and Drug Administration (FDA): [employer] Get help right away if: You may have taken too much of an opioid (overdosed). Common symptoms of an overdose: Your breathing is slower  or more shallow than normal. You have a very slow heartbeat (pulse). You have slurred speech. You have nausea and vomiting. Your pupils become very small. You have other potential symptoms: You are very confused. You faint or feel like you will faint. You have cold, clammy skin. You have blue lips or fingernails. You have thoughts of harming yourself or harming others. These symptoms may represent a serious problem that is an emergency. Do not wait to see if the symptoms will go away. Get medical help right away. Call your local emergency services (911 in the [place] Do not drive yourself to the hospital.  If you ever feel like you may hurt yourself or others, or have thoughts about taking your own life, get help right away.  Go to your nearest emergency department or: Call your local emergency services (911 in the [place] Call the [facility] (561)006-3129 in the [place] Call a suicide crisis helpline, such as the [place] at 215-295-8238 or 988 in the [place]. This is open 24 hours a day in the [place] Text the Crisis Text Line at 947-401-3549 (in the [place].). Summary Opioid medicines can help you manage moderate to severe pain for a short period of time. A pain treatment plan is an agreement between you and your health care provider. Discuss the goals of your treatment, including how much pain you might expect to have and how you will manage the pain. If you think that you or someone else may have taken too much of an opioid, get medical help right away. This information is not intended to replace advice given to you by your health care provider. Make sure you discuss any questions you have with your health care provider. Document Revised: 01/19/2021 Document Reviewed: 10/06/2020 Elsevier Patient Education  [place] directives: Please bring a copy of your health care power of attorney and living will to the office to be added to your chart at your convenience.   Conditions/risks identified: None  Next appointment: Follow up in one year for your annual wellness visit     Preventive Care 65 Years and Older, Female Preventive care refers to lifestyle choices and visits with your health care provider that can promote health and wellness. What does preventive care include? A yearly physical exam. This is also called an annual well check. Dental exams once or twice a year. Routine eye exams. Ask your health care provider how often you should have your eyes checked. Personal lifestyle choices, including: Daily care of your teeth and gums. Regular physical activity. Eating a healthy diet. Avoiding tobacco and drug use. Limiting alcohol use. Practicing  safe sex. Taking low-dose aspirin every day. Taking vitamin and mineral supplements as recommended by your health care provider. What happens during an annual well check? The services and screenings done by your health care provider during your annual well check will depend on your age, overall health, lifestyle risk factors, and family history of disease. Counseling  Your health care provider may ask you questions about your: Alcohol use. Tobacco use. Drug use. Emotional well-being. Home and relationship well-being. Sexual activity. Eating habits. History of falls. Memory and ability to understand (cognition). Work and work Statistician. Reproductive health. Screening  You may have the following tests or measurements: Height, weight, and BMI. Blood pressure. Lipid and cholesterol levels. These may be checked every 5 years, or more frequently if you are over 5 years old. Skin check. Lung cancer screening. You  may have this screening every year starting at age 32 if you have a 30-pack-year history of smoking and currently smoke or have quit within the past 15 years. Fecal occult blood test (FOBT) of the stool. You may have this test every year starting at age 11. Flexible sigmoidoscopy or colonoscopy. You may have a sigmoidoscopy every 5 years or a colonoscopy every 10 years starting at age 17. Hepatitis C blood test. Hepatitis B blood test. Sexually transmitted disease (STD) testing. Diabetes screening. This is done by checking your blood sugar (glucose) after you have not eaten for a while (fasting). You may have this done every 1-3 years. Bone density scan. This is done to screen for osteoporosis. You may have this done starting at age 90. Mammogram. This may be done every 1-2 years. Talk to your health care provider about how often you should have regular mammograms. Talk with your health care provider about your test results, treatment options, and if necessary, the need for more  tests. Vaccines  Your health care provider may recommend certain vaccines, such as: Influenza vaccine. This is recommended every year. Tetanus, diphtheria, and acellular pertussis (Tdap, Td) vaccine. You may need a Td booster every 10 years. Zoster vaccine. You may need this after age 73. Pneumococcal 13-valent conjugate (PCV13) vaccine. One dose is recommended after age 94. Pneumococcal polysaccharide (PPSV23) vaccine. One dose is recommended after age 42. Talk to your health care provider about which screenings and vaccines you need and how often you need them. This information is not intended to replace advice given to you by your health care provider. Make sure you discuss any questions you have with your health care provider. Document Released: 07/23/2015 Document Revised: 03/15/2016 Document Reviewed: 04/27/2015 Elsevier Interactive Patient Education  2017 [place] Prevention in the Home Falls can cause injuries. They can happen to people of all ages. There are many things you can do to make your home safe and to help prevent falls. What can I do on the outside of my home? Regularly fix the edges of walkways and driveways and fix any cracks. Remove anything that might make you trip as you walk through a door, such as a raised step or threshold. Trim any bushes or trees on the path to your home. Use bright outdoor lighting. Clear any walking paths of anything that might make someone trip, such as rocks or tools. Regularly check to see if handrails are loose or broken. Make sure that both sides of any steps have handrails. Any raised decks and porches should have guardrails on the edges. Have any leaves, snow, or ice cleared regularly. Use sand or salt on walking paths during winter. Clean up any spills in your garage right away. This includes oil or grease spills. What can I do in the bathroom? Use night lights. Install grab bars by the toilet and in the tub and shower.  Do not use towel bars as grab bars. Use non-skid mats or decals in the tub or shower. If you need to sit down in the shower, use a plastic, non-slip stool. Keep the floor dry. Clean up any water that spills on the floor as soon as it happens. Remove soap buildup in the tub or shower regularly. Attach bath mats securely with double-sided non-slip rug tape. Do not have throw rugs and other things on the floor that can make you trip. What can I do in the bedroom? Use night lights. Make sure that you have a  light by your bed that is easy to reach. Do not use any sheets or blankets that are too big for your bed. They should not hang down onto the floor. Have a firm chair that has side arms. You can use this for support while you get dressed. Do not have throw rugs and other things on the floor that can make you trip. What can I do in the kitchen? Clean up any spills right away. Avoid walking on wet floors. Keep items that you use a lot in easy-to-reach places. If you need to reach something above you, use a strong step stool that has a grab bar. Keep electrical cords out of the way. Do not use floor polish or wax that makes floors slippery. If you must use wax, use non-skid floor wax. Do not have throw rugs and other things on the floor that can make you trip. What can I do with my stairs? Do not leave any items on the stairs. Make sure that there are handrails on both sides of the stairs and use them. Fix handrails that are broken or loose. Make sure that handrails are as long as the stairways. Check any carpeting to make sure that it is firmly attached to the stairs. Fix any carpet that is loose or worn. Avoid having throw rugs at the top or bottom of the stairs. If you do have throw rugs, attach them to the floor with carpet tape. Make sure that you have a light switch at the top of the stairs and the bottom of the stairs. If you do not have them, ask someone to add them for you. What else  can I do to help prevent falls? Wear shoes that: Do not have high heels. Have rubber bottoms. Are comfortable and fit you well. Are closed at the toe. Do not wear sandals. If you use a stepladder: Make sure that it is fully opened. Do not climb a closed stepladder. Make sure that both sides of the stepladder are locked into place. Ask someone to hold it for you, if possible. Clearly mark and make sure that you can see: Any grab bars or handrails. First and last steps. Where the edge of each step is. Use tools that help you move around (mobility aids) if they are needed. These include: Canes. Walkers. Scooters. Crutches. Turn on the lights when you go into a dark area. Replace any light bulbs as soon as they burn out. Set up your furniture so you have a clear path. Avoid moving your furniture around. If any of your floors are uneven, fix them. If there are any pets around you, be aware of where they are. Review your medicines with your doctor. Some medicines can make you feel dizzy. This can increase your chance of falling. Ask your doctor what other things that you can do to help prevent falls. This information is not intended to replace advice given to you by your health care provider. Make sure you discuss any questions you have with your health care provider. Document Released: 04/22/2009 Document Revised: 12/02/2015 Document Reviewed: 07/31/2014 Elsevier Interactive Patient Education  2017 [employer].

## 2022-08-04 NOTE — Progress Notes (Cosign Needed Addendum)
Subjective:   [name] is a 82 y.o. female who presents for Medicare Annual (Subsequent) preventive examination.  Review of Systems      Cardiac Risk Factors include: advanced age (>65mn, >>53women);hypertension     Objective:    Today's Vitals   08/04/22 1314  BP: 128/62  Pulse: 74  Temp: 98.7 F (37.1 C)  TempSrc: Oral  SpO2: 96%  Weight: 166 lb 12.8 oz (75.7 kg)  Height: '5\' 5"'$  (1.651 m)   Body mass index is 27.76 kg/m.     08/04/2022    1:24 PM 12/19/2021   11:39 AM 12/13/2021   11:10 AM 01/22/2021    4:00 PM 01/06/2021    6:03 PM 11/08/2015    5:10 PM 11/08/2015   12:24 PM  Advanced Directives  Does Patient Have a Medical Advance Directive? Yes Yes Yes Yes Yes Yes Yes  Type of AParamedicof ADonnaLiving will Living will;Healthcare Power of AK[place]Living will HEl Ren[place]Living will HWaverlyLiving will HMcNabbLiving will HHannawa FallsLiving will  Does patient want to make changes to medical advance directive?  No - Patient declined    No - Patient declined   Copy of [place] Chart? No - copy requested No - copy requested No - copy requested   No - copy requested No - copy requested    Current Medications (verified) Outpatient Encounter Medications as of 08/04/2022  Medication Sig   amLODipine (NORVASC) 5 MG tablet Take 1 tablet (5 mg total) by mouth daily.   atenolol (TENORMIN) 25 MG tablet TAKE ONE TABLET BY MOUTH DAILY   cetirizine (ZYRTEC) 10 MG tablet Take 10 mg by mouth daily.   cyclobenzaprine (FLEXERIL) 10 MG tablet Take 10 mg by mouth as needed for muscle spasms.   furosemide (LASIX) 20 MG tablet Take 1 tablet (20 mg total) by mouth daily.   hydroxypropyl methylcellulose / hypromellose (ISOPTO TEARS / GONIOVISC) 2.5 % ophthalmic solution Place 1 drop into both eyes as needed for dry eyes.   Melatonin 10  MG TABS Take 20 mg by mouth at bedtime.   pregabalin (LYRICA) 75 MG capsule Take 1 capsule (75 mg total) by mouth daily.   simvastatin (ZOCOR) 40 MG tablet Take 1 tablet (40 mg total) by mouth at bedtime.   sodium chloride (OCEAN) 0.65 % SOLN nasal spray Place 1 spray into both nostrils as needed for congestion.   traMADol (ULTRAM) 50 MG tablet Take 2 tablets (100 mg total) by mouth every 12 (twelve) hours as needed.   traZODone (DESYREL) 50 MG tablet TAKE ONE TABLET BY MOUTH EVERY NIGHT AT BEDTIME AS NEEDED FOR SLEEP   [DISCONTINUED] apixaban (ELIQUIS) 5 MG TABS tablet Take 1 tablet (5 mg total) by mouth 2 (two) times daily.   No facility-administered encounter medications on file as of 08/04/2022.    Allergies (verified) Propoxyphene, Erythromycin base, Hctz [hydrochlorothiazide], Lisinopril, and Sulfa antibiotics   History: Past Medical History:  Diagnosis Date   Anemia    BCC (basal cell carcinoma of skin) 03/27/2006   Above Left Upper Lip (MOH's)   Benign essential tremor    Chronic kidney disease    Essential hypertension, benign    Fibromyalgia    GERD (gastroesophageal reflux disease)    No longer a problem   Heart murmur    Hemorrhoids    Hyponatremia    secondary to hctz-recurrent March  2010   Low back pain    facet joint pain   Occipital neuralgia    Osteoarthritis    Osteopenia    Pneumonia    Pure hypercholesterolemia 12/16/2013   SCCA (squamous cell carcinoma) of skin 07/15/2002   Left Inner Cheek(in situ)   SCCA (squamous cell carcinoma) of skin 05/06/2004   Right Wing Nose(in situ) -Cx3,5FU   SCCA (squamous cell carcinoma) of skin 01/09/2011   Above Right Upper Lip(in situ)-Cx3   SCCA (squamous cell carcinoma) of skin 01/05/2014   Left [place](in situ)-Cx3,5FU   SCCA (squamous cell carcinoma) of skin 09/30/2014   Right Wrist(in situ)-tx p bx   Shortness of breath dyspnea    Sleep apnea    Mild sleep apnea   Superficial basal cell carcinoma (BCC)  08/17/1982   Right Side Upper Lip   Tuberculosis    as a child   Past Surgical History:  Procedure Laterality Date   TOTAL KNEE ARTHROPLASTY Left 11/08/2015   Procedure: LEFT TOTAL KNEE ARTHROPLASTY;  Surgeon: Gaynelle Arabian, MD;  Location: [facility];  Service: Orthopedics;  Laterality: Left;   TOTAL KNEE ARTHROPLASTY Right 12/19/2021   Procedure: TOTAL KNEE ARTHROPLASTY;  Surgeon: Gaynelle Arabian, MD;  Location: [facility];  Service: Orthopedics;  Laterality: Right;   TUBAL LIGATION     Family History  Problem Relation Age of Onset   Heart disease Father    Prostate cancer Father    Heart disease Mother    Cancer - Ovarian Mother    Congestive Heart Failure Mother    Stroke Sister    Social History   Socioeconomic History   Marital status: Divorced    Spouse name: Not on file   Number of children: 2   Years of education: Masters   Highest education level: Not on file  Occupational History   Occupation: Retired  Tobacco Use   Smoking status: Never   Smokeless tobacco: Never  Vaping Use   Vaping Use: Never used  Substance and Sexual Activity   Alcohol use: Yes    Alcohol/week: 7.0 standard drinks of alcohol    Types: 7 Standard drinks or equivalent per week    Comment: Cocktail daily   Drug use: No   Sexual activity: Not on file  Other Topics Concern   Not on file  Social History Narrative   Denies caffeine use    Social Determinants of Health   Financial Resource Strain: Low Risk  (08/04/2022)   Overall Financial Resource Strain (CARDIA)    Difficulty of Paying Living Expenses: Not hard at all  Food Insecurity: No Food Insecurity (08/04/2022)   Hunger Vital Sign    Worried About Running Out of Food in the Last Year: Never true    Ran Out of Food in the Last Year: Never true  Transportation Needs: No Transportation Needs (08/04/2022)   PRAPARE - Hydrologist (Medical): No    Lack of Transportation (Non-Medical): No  Physical Activity:  Sufficiently Active (08/04/2022)   Exercise Vital Sign    Days of Exercise per Week: 5 days    Minutes of Exercise per Session: 80 min  Stress: No Stress Concern Present (08/04/2022)   [place]    Feeling of Stress : Not at all  Social Connections: Moderately Integrated (08/04/2022)   Social Connection and Isolation Panel [NHANES]    Frequency of Communication with Friends and Family: More than three times a week  Frequency of Social Gatherings with Friends and Family: More than three times a week    Attends Religious Services: More than 4 times per year    Active Member of Clubs or Organizations: Yes    Attends Music therapist: More than 4 times per year    Marital Status: Divorced    Tobacco Counseling Counseling given: Not Answered   Clinical Intake:  Pre-visit preparation completed: No  Pain : No/denies pain     BMI - recorded: 27.76 Nutritional Status: BMI 25 -29 Overweight Nutritional Risks: None Diabetes: No  How often do you need to have someone help you when you read instructions, pamphlets, or other written materials from your doctor or pharmacy?: 1 - Never  Diabetic?  No  Interpreter Needed?: No  Information entered by :: Rolene Arbour LPN   Activities of Daily Living    08/04/2022    1:23 PM 12/19/2021   11:39 AM  In your present state of health, do you have any difficulty performing the following activities:  Hearing? 0 0  Vision? 0 0  Difficulty concentrating or making decisions? 0 0  Walking or climbing stairs? 0 1  Dressing or bathing? 0 1  Doing errands, shopping? 0 0  Preparing Food and eating ? N   Using the Toilet? N   In the past six months, have you accidently leaked urine? N   Do you have problems with loss of bowel control? N   Managing your Medications? N   Managing your Finances? N   Housekeeping or managing your Housekeeping? N     Patient Care  Team: Farrel Conners, MD as PCP - General (Family Medicine) Jettie Booze, MD as PCP - Cardiology (Cardiology) Lavonna Monarch, MD (Inactive) as Consulting Physician (Dermatology) Warren Danes, PA-C as Physician Assistant (Dermatology)  Indicate any recent Medical Services you may have received from other than [facility] providers in the past year (date may be approximate).     Assessment:   This is a routine wellness examination for Yasmine.  Hearing/Vision screen Hearing Screening - Comments:: Denies hearing difficulties   Vision Screening - Comments::  - up to date with routine eye exams with  Dr Celene Squibb  Dietary issues and exercise activities discussed: Exercise limited by: None identified   Goals Addressed               This Visit's Progress     No current goals (pt-stated)         Depression Screen    08/04/2022    1:23 PM  PHQ 2/9 Scores  PHQ - 2 Score 0    Fall Risk    08/04/2022    1:24 PM 06/21/2020    2:03 PM  [place] in the past year? 0 0  Number falls in past yr: 0   Injury with Fall? 0   Risk for fall due to : No Fall Risks   Follow up Falls prevention discussed     [place]:  Any stairs in or around the home? No  If so, are there any without handrails? No  Home free of loose throw rugs in walkways, pet beds, electrical cords, etc? Yes  Adequate lighting in your home to reduce risk of falls? Yes   ASSISTIVE DEVICES UTILIZED TO PREVENT FALLS:  Life alert? No  Use of a cane, walker or w/c? No  Grab bars in the bathroom? Yes  Shower  chair or bench in shower? No  Elevated toilet seat or a handicapped toilet? No   TIMED UP AND GO:  Was the test performed? Yes .  Length of time to ambulate 10 feet: 10 sec.   Gait steady and fast without use of assistive device  Cognitive Function:        08/04/2022    1:25 PM  6CIT Screen  What Year? 0 points  What month? 0 points  What time? 0  points  Count back from 20 0 points  Months in reverse 0 points  Repeat phrase 0 points  Total Score 0 points    Immunizations Immunization History  Administered Date(s) Administered   PFIZER Comirnaty(Gray Top)Covid-19 Tri-Sucrose Vaccine 04/07/2022   PFIZER(Purple Top)SARS-COV-2 Vaccination 07/28/2019, 08/20/2019    TDAP status: Due, Education has been provided regarding the importance of this vaccine. Advised may receive this vaccine at local pharmacy or Health Dept. Aware to provide a copy of the vaccination record if obtained from local pharmacy or Health Dept. Verbalized acceptance and understanding.    Pneumococcal vaccine status: Due, Education has been provided regarding the importance of this vaccine. Advised may receive this vaccine at local pharmacy or Health Dept. Aware to provide a copy of the vaccination record if obtained from local pharmacy or Health Dept. Verbalized acceptance and understanding.  Covid-19 vaccine status: Completed vaccines  Qualifies for Shingles Vaccine? Yes   Zostavax completed No   Shingrix Completed?: No.    Education has been provided regarding the importance of this vaccine. Patient has been advised to call insurance company to determine out of pocket expense if they have not yet received this vaccine. Advised may also receive vaccine at local pharmacy or Health Dept. Verbalized acceptance and understanding.  Screening Tests Health Maintenance  Topic Date Due   DTaP/Tdap/Td (1 - Tdap) Never done   COVID-19 Vaccine (4 - 2023-24 season) 08/20/2022 (Originally 06/02/2022)   INFLUENZA VACCINE  10/09/2022 (Originally 02/07/2022)   Zoster Vaccines- Shingrix (1 of 2) 11/03/2022 (Originally 12/07/1959)   Pneumonia Vaccine 19+ Years old (1 - PCV) 08/05/2023 (Originally 12/06/2004)   Medicare Annual Wellness (AWV)  08/05/2023   DEXA SCAN  Completed   HPV VACCINES  Aged Out    Health Maintenance  Health Maintenance Due  Topic Date Due    DTaP/Tdap/Td (1 - Tdap) Never done    Colorectal cancer screening: No longer required.   Mammogram status: No longer required due to Age.  Bone Density status: Completed 11/09/03. Results reflect: Bone density results: OSTEOPENIA. Repeat every   years.  Lung Cancer Screening: (Low Dose CT Chest recommended if Age 28-80 years, 30 pack-year currently smoking OR have quit w/in 15years.) does not qualify.     Additional Screening:  Hepatitis C Screening: does not qualify; Completed   Vision Screening: Recommended annual ophthalmology exams for early detection of glaucoma and other disorders of the eye. Is the patient up to date with their annual eye exam?  Yes  Who is the provider or what is the name of the office in which the patient attends annual eye exams? Dr Eben Burow If pt is not established with a provider, would they like to be referred to a provider to establish care? No .   Dental Screening: Recommended annual dental exams for proper oral hygiene  Community Resource Referral / Chronic Care Management:  CRR required this visit?  No   CCM required this visit?  No      Plan:  I have personally reviewed and noted the following in the patient's chart:   Medical and social history Use of alcohol, tobacco or illicit drugs  Current medications and supplements including opioid prescriptions. Patient is currently taking opioid prescriptions. Information provided to patient regarding non-opioid alternatives. Patient advised to discuss non-opioid treatment plan with their provider. Functional ability and status Nutritional status Physical activity Advanced directives List of other physicians Hospitalizations, surgeries, and ER visits in previous 12 months Vitals Screenings to include cognitive, depression, and falls Referrals and appointments  In addition, I have reviewed and discussed with patient certain preventive protocols, quality metrics, and best practice  recommendations. A written personalized care plan for preventive services as well as general preventive health recommendations were provided to patient.     Criselda Peaches, LPN   [id number]   Nurse Notes: None

## 2022-08-15 DIAGNOSIS — H5713 Ocular pain, bilateral: Secondary | ICD-10-CM | POA: Diagnosis not present

## 2022-08-15 DIAGNOSIS — H02051 Trichiasis without entropian right upper eyelid: Secondary | ICD-10-CM | POA: Diagnosis not present

## 2022-08-15 DIAGNOSIS — H02054 Trichiasis without entropian left upper eyelid: Secondary | ICD-10-CM | POA: Diagnosis not present

## 2022-08-18 ENCOUNTER — Encounter: Payer: Self-pay | Admitting: Family Medicine

## 2022-08-18 ENCOUNTER — Ambulatory Visit (INDEPENDENT_AMBULATORY_CARE_PROVIDER_SITE_OTHER): Payer: PPO | Admitting: Family Medicine

## 2022-08-18 VITALS — BP 122/60 | HR 73 | Temp 98.1°F | Ht 65.0 in | Wt 168.4 lb

## 2022-08-18 DIAGNOSIS — M797 Fibromyalgia: Secondary | ICD-10-CM

## 2022-08-18 DIAGNOSIS — F5104 Psychophysiologic insomnia: Secondary | ICD-10-CM

## 2022-08-18 DIAGNOSIS — G250 Essential tremor: Secondary | ICD-10-CM | POA: Diagnosis not present

## 2022-08-18 MED ORDER — PRIMIDONE 50 MG PO TABS: 50.0000 mg | ORAL_TABLET | Freq: Two times a day (BID) | ORAL | 3 refills | Status: DC

## 2022-08-18 MED ORDER — PREGABALIN 75 MG PO CAPS: 75.0000 mg | ORAL_CAPSULE | Freq: Two times a day (BID) | ORAL | 1 refills | Status: DC

## 2022-08-18 MED ORDER — TRAZODONE HCL 50 MG PO TABS: ORAL_TABLET | ORAL | 3 refills | Status: DC

## 2022-08-18 MED ORDER — CYCLOBENZAPRINE HCL 10 MG PO TABS: 10.0000 mg | ORAL_TABLET | Freq: Three times a day (TID) | ORAL | 2 refills | Status: DC | PRN

## 2022-08-18 NOTE — Progress Notes (Signed)
Established Patient Office Visit  Subjective   Patient ID: [name], female    DOB: [birth year]  Age: 82 y.o. MRN: [id number]  Chief Complaint  Patient presents with   Medication Problem    Patient states Trazodone and Melatonin with only 4 hours of sleep per patient    Patient is here for follow up on sleep. She reports that the trazodone with the melatonin worked briefly however it seemed to wear off. States that she is still only getting about 4 hours at night. I reviewed her cardiology note from December, she is off her eliquis and has restarted her primidone. She reports that the tremor is much better back on her primidone.   Patient also had questions about her lyrica- states that she used to take this twice a day and is wondering if this would help her sleep. She also reports that her cyclobenzaprine rx is expired and she needs a new one. States she very rarely ever took the cyclobenzaprine. We discussed also maybe adding this at night to see if it would also help her sleep.    Current Outpatient Medications  Medication Instructions   amLODipine (NORVASC) 5 mg, Oral, Daily   atenolol (TENORMIN) 25 MG tablet TAKE ONE TABLET BY MOUTH DAILY   cetirizine (ZYRTEC) 10 mg, Oral, Daily   cyclobenzaprine (FLEXERIL) 10 mg, Oral, 3 times daily PRN   furosemide (LASIX) 20 mg, Oral, Daily   hydroxypropyl methylcellulose / hypromellose (ISOPTO TEARS / GONIOVISC) 2.5 % ophthalmic solution 1 drop, Both Eyes, As needed   Melatonin 20 mg, Oral, Daily at bedtime   pregabalin (LYRICA) 75 mg, Oral, 2 times daily   primidone (MYSOLINE) 50 mg, Oral, 2 times daily   simvastatin (ZOCOR) 40 mg, Oral, Daily at bedtime   sodium chloride (OCEAN) 0.65 % SOLN nasal spray 1 spray, Each Nare, As needed   traMADol (ULTRAM) 100 mg, Oral, Every 12 hours PRN   traZODone (DESYREL) 50 MG tablet TAKE ONE TABLET BY MOUTH EVERY NIGHT AT BEDTIME AS NEEDED FOR SLEEP    Patient Active Problem List   Diagnosis Date  Noted   DVT (deep venous thrombosis) ([place]) 04/26/2022   AKI (acute kidney injury) ([place])    Pressure injury of skin 01/23/2021   OSA (obstructive sleep apnea) 08/23/2020   Dysphonia of essential tremor 06/21/2020   Essential tremor 06/21/2020   Chronic insomnia 06/21/2020   Status post total knee replacement, left 04/13/2017   Vitamin D deficiency 04/12/2017   History of chronic kidney disease 04/12/2017   Osteopenia of multiple sites 04/06/2017   Fibromyalgia syndrome 09/11/2016   Primary insomnia 09/11/2016   History of osteopenia 09/11/2016   Essential hypertension, benign 12/16/2013   Pure hypercholesterolemia 12/16/2013      Review of Systems  All other systems reviewed and are negative.     Objective:     BP 122/60 (BP Location: Left Arm, Patient Position: Sitting, Cuff Size: Large)   Pulse 73   Temp 98.1 F (36.7 C) (Oral)   Ht 5' 5"$  (1.651 m)   Wt 168 lb 6.4 oz (76.4 kg)   SpO2 98%   BMI 28.02 kg/m    Physical Exam Constitutional:      Appearance: Normal appearance. She is normal weight.  Eyes:     Conjunctiva/sclera: Conjunctivae normal.  Cardiovascular:     Rate and Rhythm: Normal rate and regular rhythm.     Heart sounds: Normal heart sounds.  Pulmonary:     Effort:  Pulmonary effort is normal.     Breath sounds: Normal breath sounds. No wheezing, rhonchi or rales.  Musculoskeletal:     Right lower leg: No edema.     Left lower leg: No edema.  Neurological:     Mental Status: She is alert.      No results found for any visits on 08/18/22.    The ASCVD Risk score (Arnett DK, et al., 2019) failed to calculate for the following reasons:   The 2019 ASCVD risk score is only valid for ages 68 to 65    Assessment & Plan:   Problem List Items Addressed This Visit       Unprioritized   Fibromyalgia syndrome    We had a long discussion about adjusting her medications and the risks/potential side effects of doing this. Medication changes are as  follows:  Patient will increase her lyrica to 75 mg BID, she will continue the trazodone at 50 mg at bedtime and the tramadol 100 mg as needed. She also may try adding the cyclobenzaprine 5 or 10 mg at bedtime. I advised she use caution when using these medications together as it may cause disorientation, excessive sedation, etc. She was instructed to call me and let me know if the regimen is helping her or if she experiences any side effects to the medications.       Relevant Medications   pregabalin (LYRICA) 75 MG capsule   traZODone (DESYREL) 50 MG tablet   cyclobenzaprine (FLEXERIL) 10 MG tablet   primidone (MYSOLINE) 50 MG tablet   Essential tremor    Off Eliquis (completed course) and back on primidone 50 mg BID, will refill this for her today. RTC in 6 months.       Relevant Medications   primidone (MYSOLINE) 50 MG tablet   Chronic insomnia - Primary    Chronic, ongoing, we had a long discussion about the use of prescription sleep aids and the anticholinergic effects, I think that the combination of the lyrica 75 mg and the trazodone might help her, if not then adding 1/2 tablet or 1 tablet of the cyclobenzaprine at bedtime might also help. See discussion above.      Relevant Medications   traZODone (DESYREL) 50 MG tablet    Return in about 6 months (around 02/16/2023) for HTN.    Farrel Conners, MD

## 2022-08-18 NOTE — Assessment & Plan Note (Signed)
Off Eliquis (completed course) and back on primidone 50 mg BID, will refill this for her today. RTC in 6 months.

## 2022-08-18 NOTE — Assessment & Plan Note (Signed)
Chronic, ongoing, we had a long discussion about the use of prescription sleep aids and the anticholinergic effects, I think that the combination of the lyrica 75 mg and the trazodone might help her, if not then adding 1/2 tablet or 1 tablet of the cyclobenzaprine at bedtime might also help. See discussion above.

## 2022-08-18 NOTE — Assessment & Plan Note (Signed)
We had a long discussion about adjusting her medications and the risks/potential side effects of doing this. Medication changes are as follows:  Patient will increase her lyrica to 75 mg BID, she will continue the trazodone at 50 mg at bedtime and the tramadol 100 mg as needed. She also may try adding the cyclobenzaprine 5 or 10 mg at bedtime. I advised she use caution when using these medications together as it may cause disorientation, excessive sedation, etc. She was instructed to call me and let me know if the regimen is helping her or if she experiences any side effects to the medications.

## 2022-08-21 ENCOUNTER — Other Ambulatory Visit: Payer: Self-pay | Admitting: Nurse Practitioner

## 2022-08-22 ENCOUNTER — Ambulatory Visit: Payer: PPO | Admitting: Family Medicine

## 2022-08-23 MED ORDER — FUROSEMIDE 20 MG PO TABS: 20.0000 mg | ORAL_TABLET | ORAL | 3 refills | Status: DC

## 2022-08-23 NOTE — Addendum Note (Signed)
Addended by: Vergia Alcon A on: 08/23/2022 11:07 AM   Modules accepted: Orders

## 2022-08-24 ENCOUNTER — Ambulatory Visit: Payer: PPO | Admitting: Family Medicine

## 2022-09-07 ENCOUNTER — Telehealth: Payer: Self-pay | Admitting: Family Medicine

## 2022-09-07 DIAGNOSIS — Z23 Encounter for immunization: Secondary | ICD-10-CM

## 2022-09-07 MED ORDER — SHINGRIX 50 MCG/0.5ML IM SUSR: 0.5000 mL | Freq: Once | INTRAMUSCULAR | 0 refills | Status: AC

## 2022-09-07 NOTE — Telephone Encounter (Signed)
Rx done. 

## 2022-09-07 NOTE — Telephone Encounter (Signed)
Requesting an order to her pharmacy for a new shingles vaccine that was released in January. Not even sure if it exisits  [facility] [id number] - [place], [address] Phone: [phone]  Fax: [phone]

## 2022-09-07 NOTE — Telephone Encounter (Signed)
Ok to send order. 

## 2022-09-11 ENCOUNTER — Other Ambulatory Visit: Payer: Self-pay | Admitting: Family Medicine

## 2022-09-11 DIAGNOSIS — M797 Fibromyalgia: Secondary | ICD-10-CM

## 2022-09-12 ENCOUNTER — Other Ambulatory Visit: Payer: Self-pay | Admitting: Family Medicine

## 2022-09-12 DIAGNOSIS — M797 Fibromyalgia: Secondary | ICD-10-CM

## 2022-11-15 IMAGING — DX DG CHEST 1V PORT
1 series · 2 of 2 positions shown · non-contrast
Comparison: 05/05/2020

CLINICAL DATA: Chest pain

EXAM:
PORTABLE CHEST 1 VIEW

[Series 1: chest · 0.14mm/px · 2 of 2 slices shown]
[im 1/2]
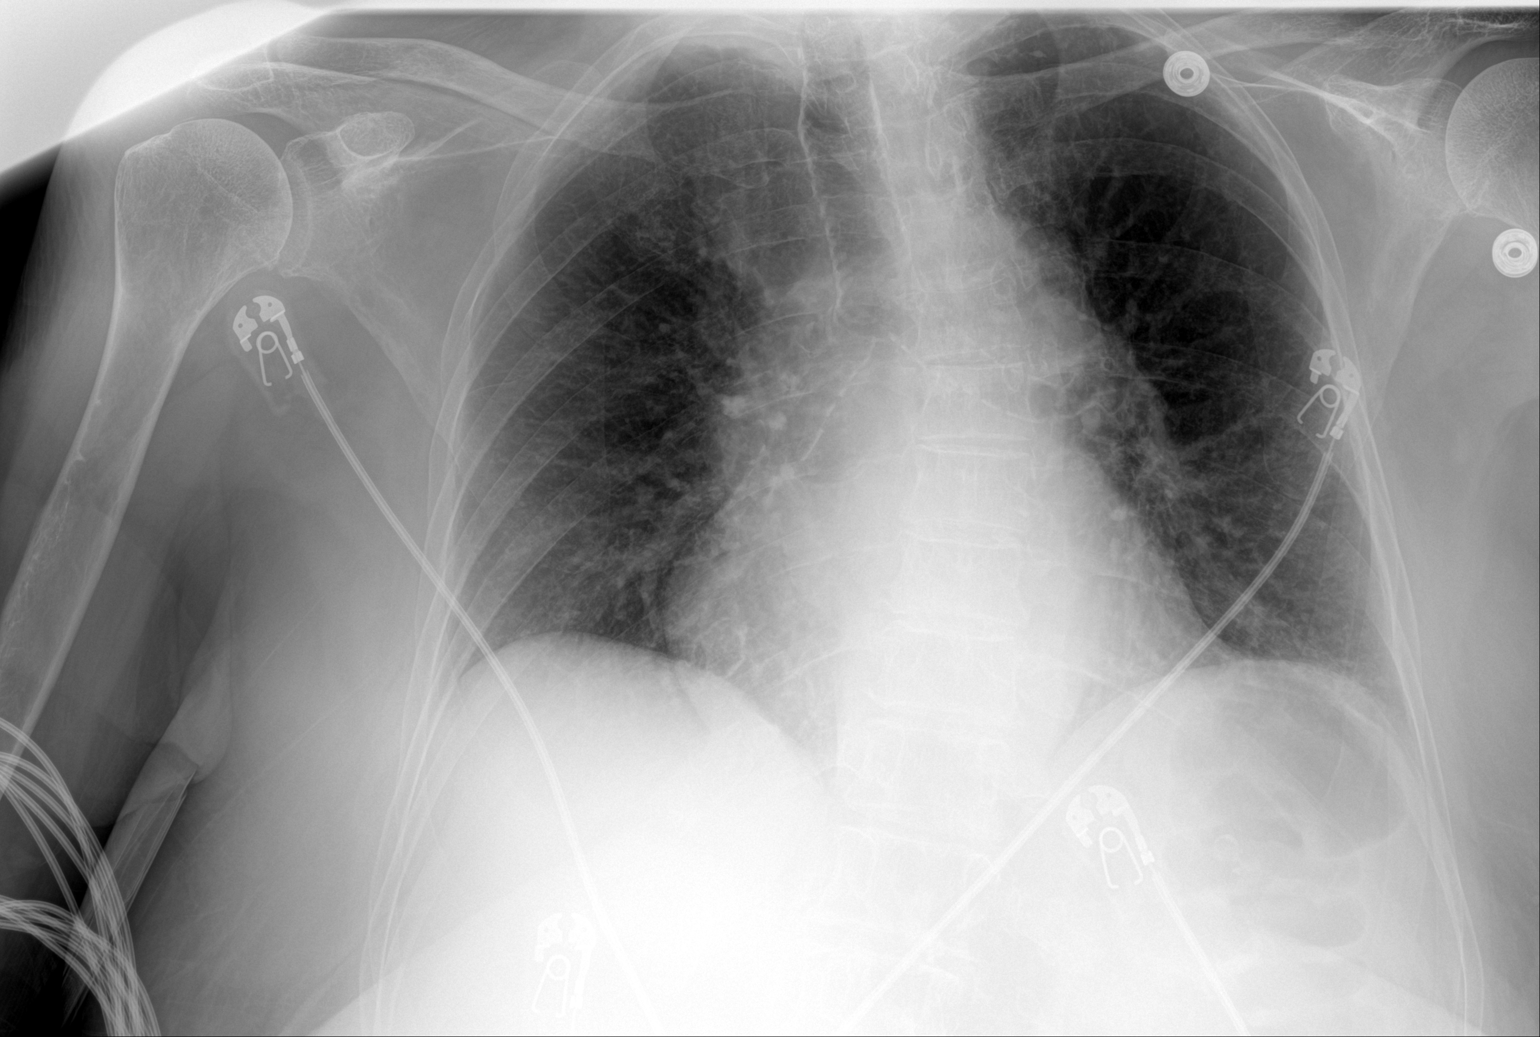
[im 2/2]
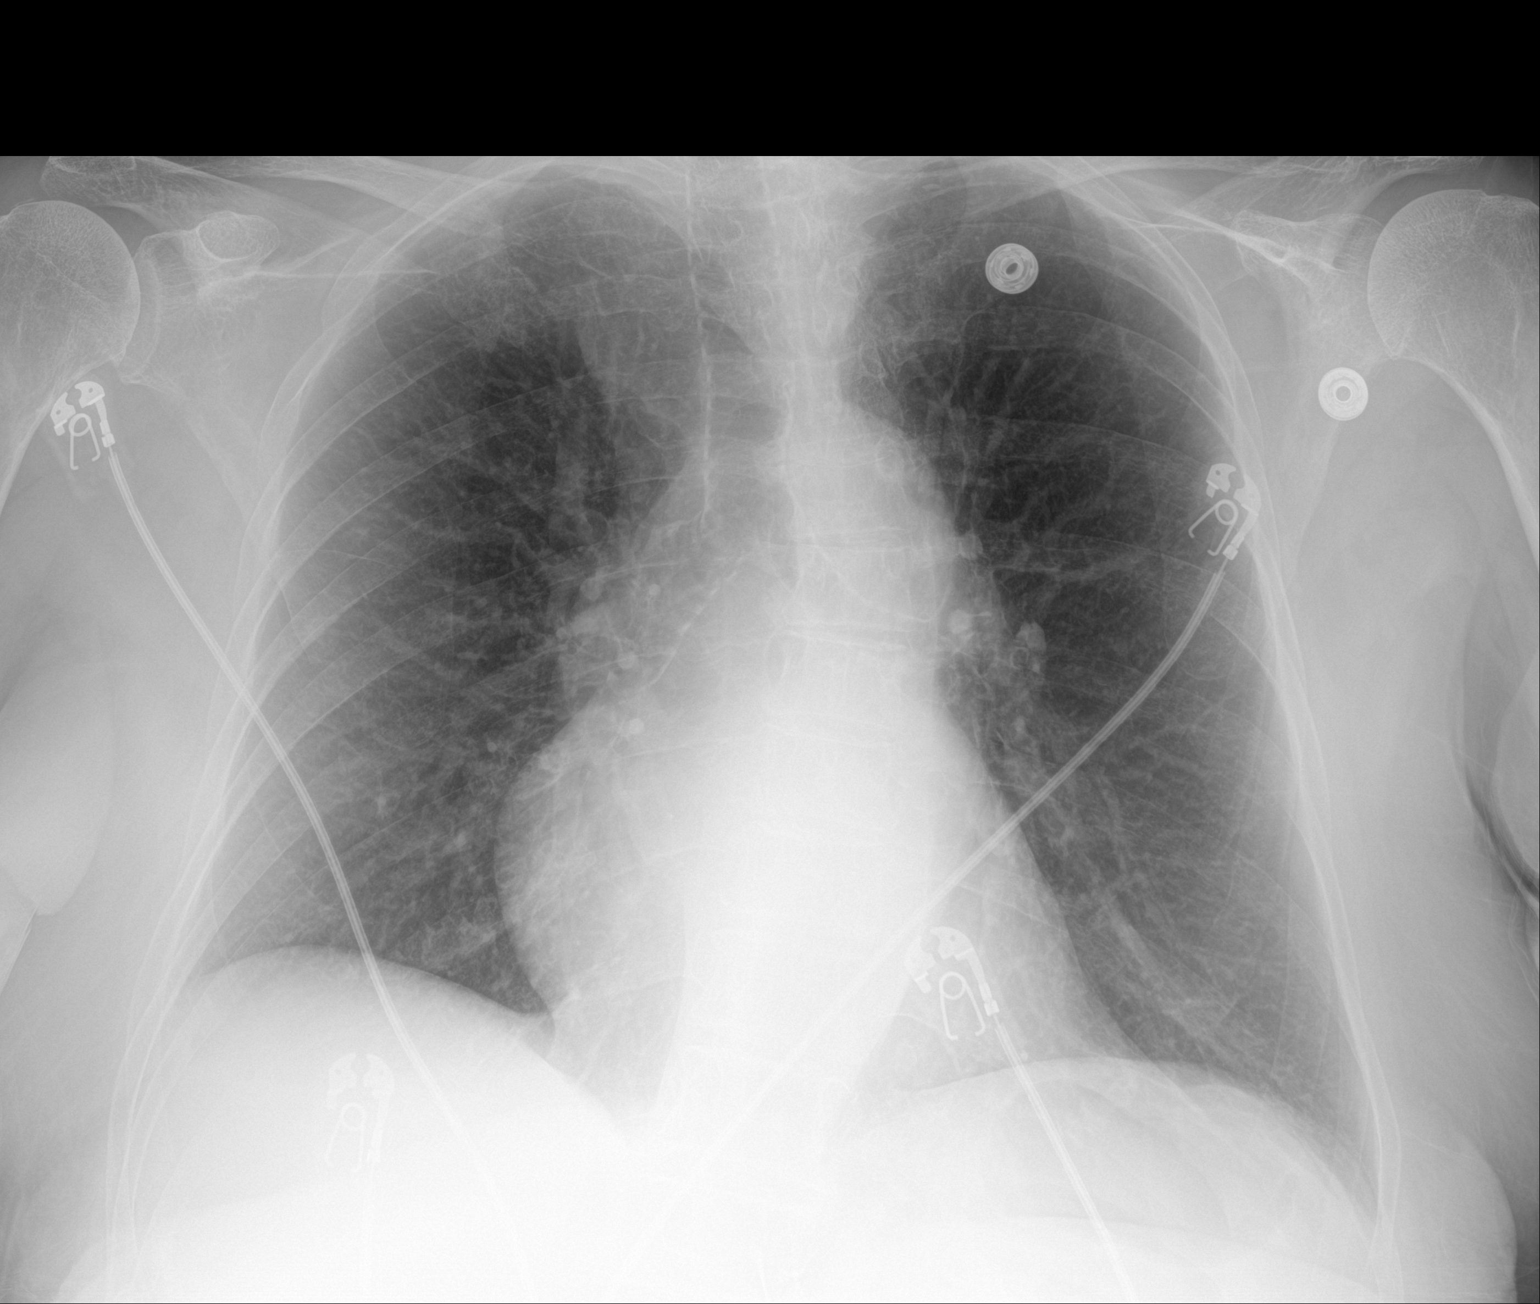

[2 of 2 positions shown; findings below may reference images not displayed]

FINDINGS: Heart and mediastinal contours are within normal limits. No focal
opacities or effusions. No acute bony abnormality.
IMPRESSION: No active disease.

## 2022-11-27 ENCOUNTER — Telehealth: Payer: Self-pay | Admitting: Family Medicine

## 2022-11-27 DIAGNOSIS — Z0279 Encounter for issue of other medical certificate: Secondary | ICD-10-CM

## 2022-11-27 NOTE — Telephone Encounter (Signed)
Patient dropped off document  permission to continue exercise program , to be filled out by provider. Patient requested to send it via Call Patient to pick up within 7-days. Document is located in providers tray at front office.Please advise at [facility] [phone]

## 2022-11-30 IMAGING — DX DG CHEST 1V PORT
1 series · 1 of 1 positions shown · non-contrast
Comparison: 01/06/2021

CLINICAL DATA: Altered level of consciousness

EXAM:
PORTABLE CHEST 1 VIEW

[chest]
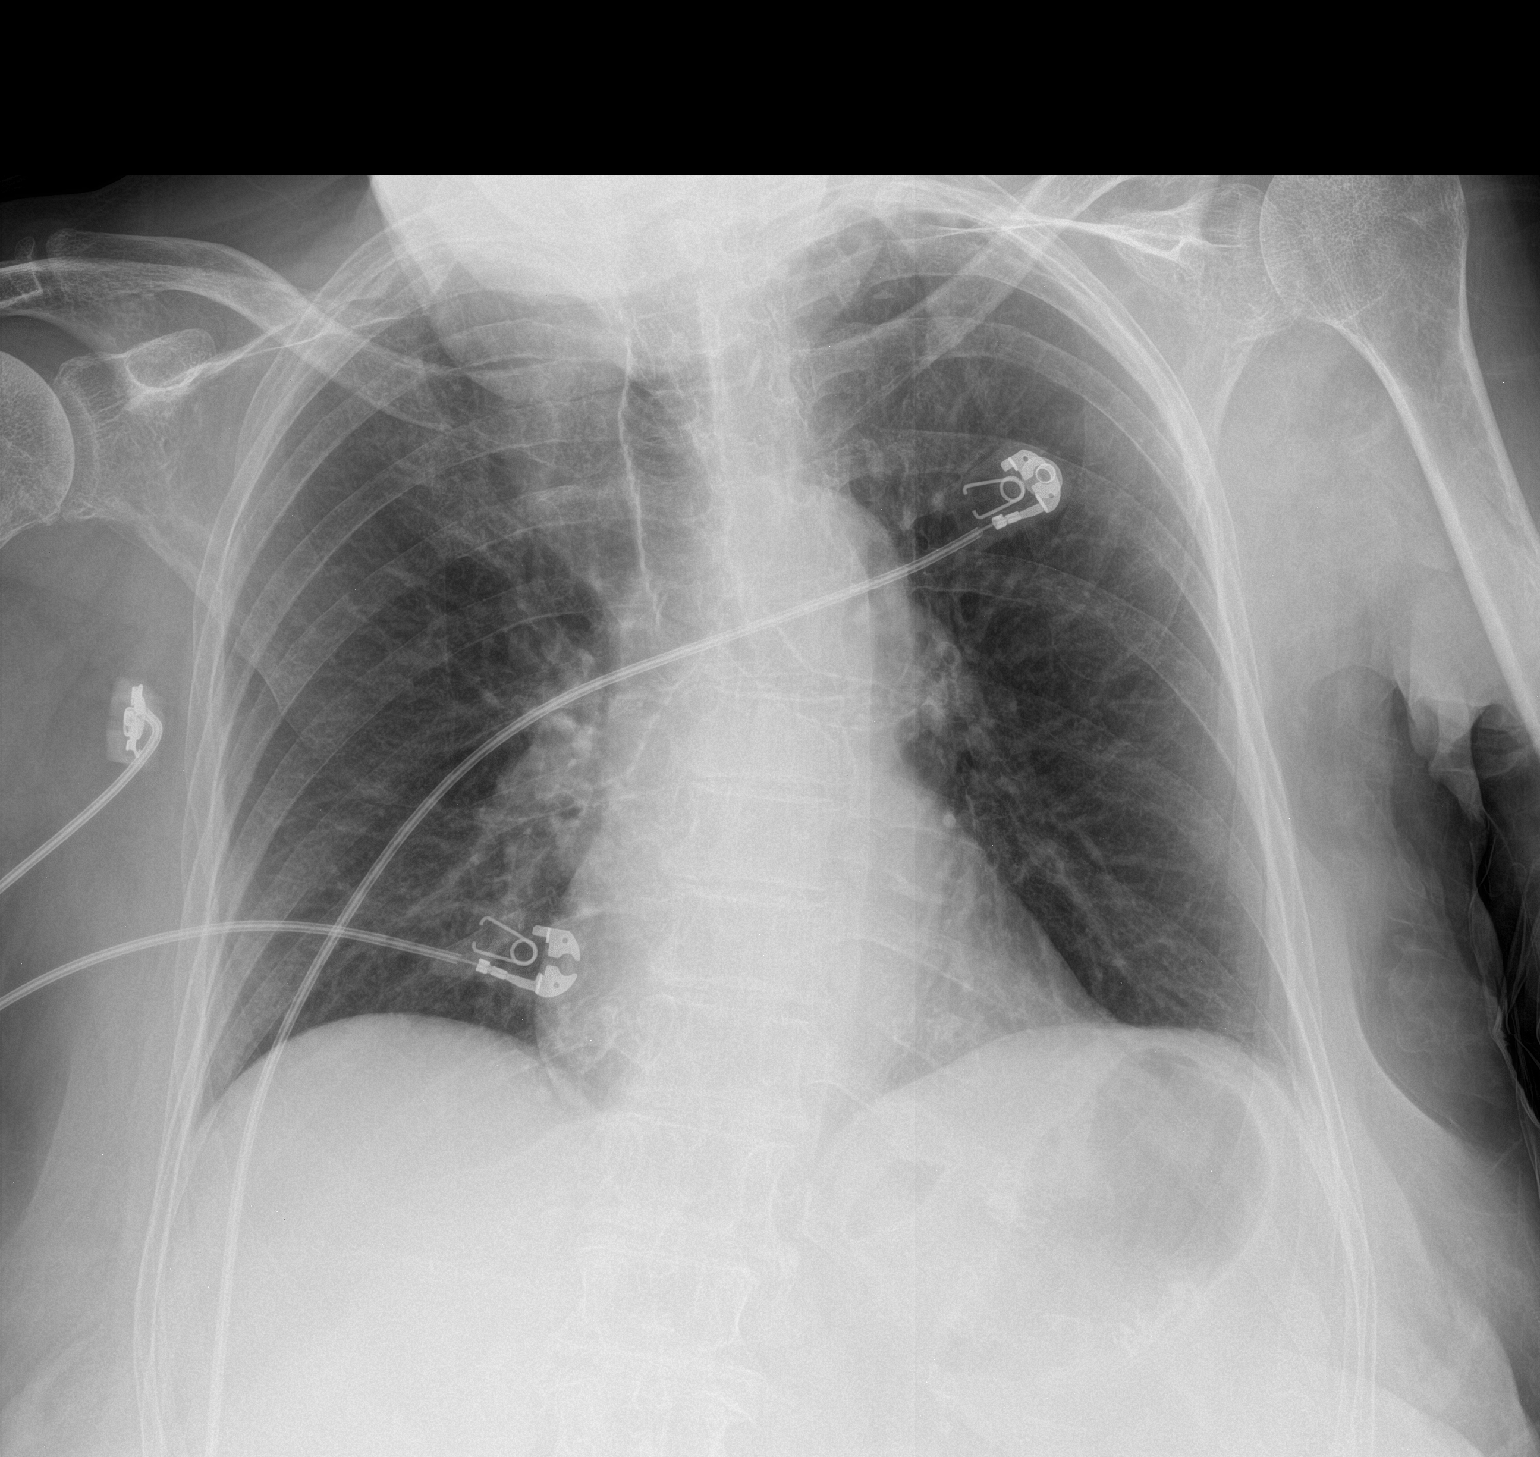

[1 of 1 positions shown; findings below may reference images not displayed]

FINDINGS: Single frontal view of the chest demonstrates an unremarkable
cardiac silhouette. No airspace disease, effusion, or pneumothorax.
IMPRESSION: 1. No acute intrathoracic process.

## 2022-12-02 IMAGING — MR MR HEAD W/O CM
6 of 10 series · 29 of 48 positions shown · non-contrast
Comparison: CT head 01/21/2021

CLINICAL DATA: Mental status change

EXAM:
MRI HEAD WITHOUT CONTRAST
TECHNIQUE: Multiplanar, multiecho pulse sequences of the brain and surrounding
structures were obtained without intravenous contrast.

[Series 2: DWI · axial · 3.0mm · 0.94mm/px · z∈[-57,+95]mm · 9 of 104 slices shown (1 of 2)]
[im 1/104]
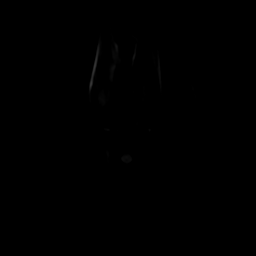
[im 13/104]
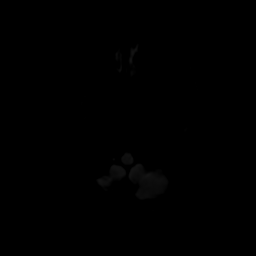
[im 26/104]
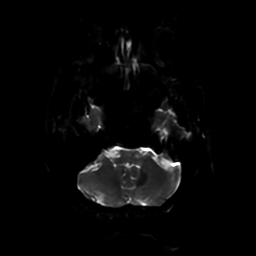
[im 39/104]
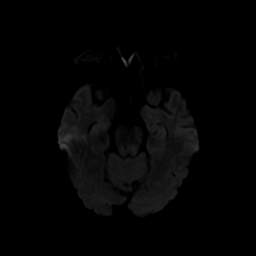
[im 52/104]
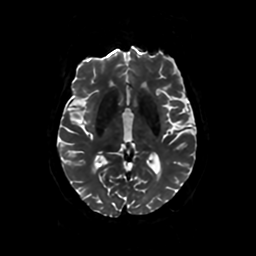
[im 65/104]
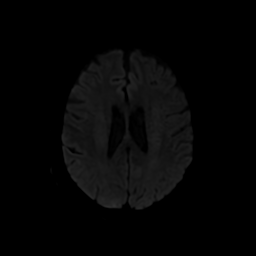
[im 78/104]
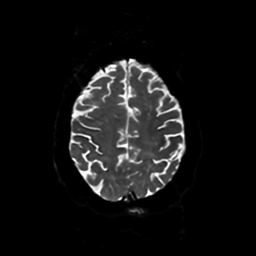
[im 91/104]
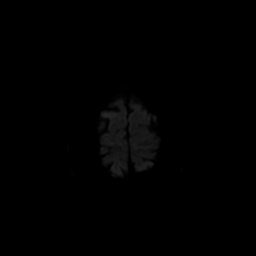
[im 104/104]
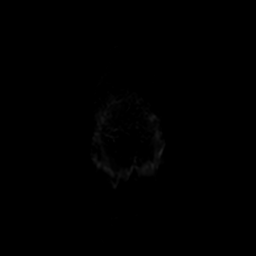

[Series 3: DWI · coronal · 4.0mm · 0.94mm/px · 7 of 73 slices shown (2 of 2)]
[im 1/73]
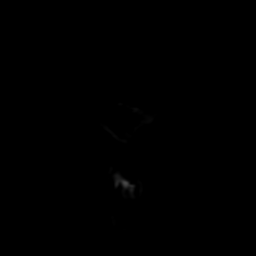
[im 13/73]
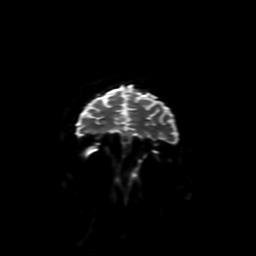
[im 25/73]
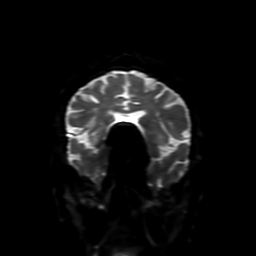
[im 37/73]
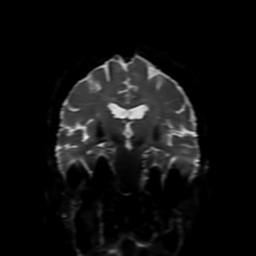
[im 49/73]
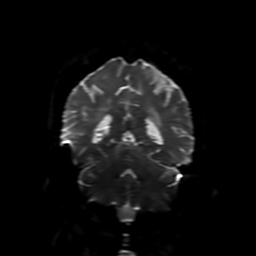
[im 61/73]
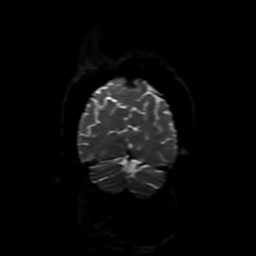
[im 73/73]
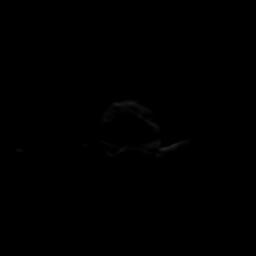

[Series 4: FLAIR · sagittal · 5.0mm · 0.23mm/px · 2 of 25 slices shown (1 of 2)]
[im 1/25]
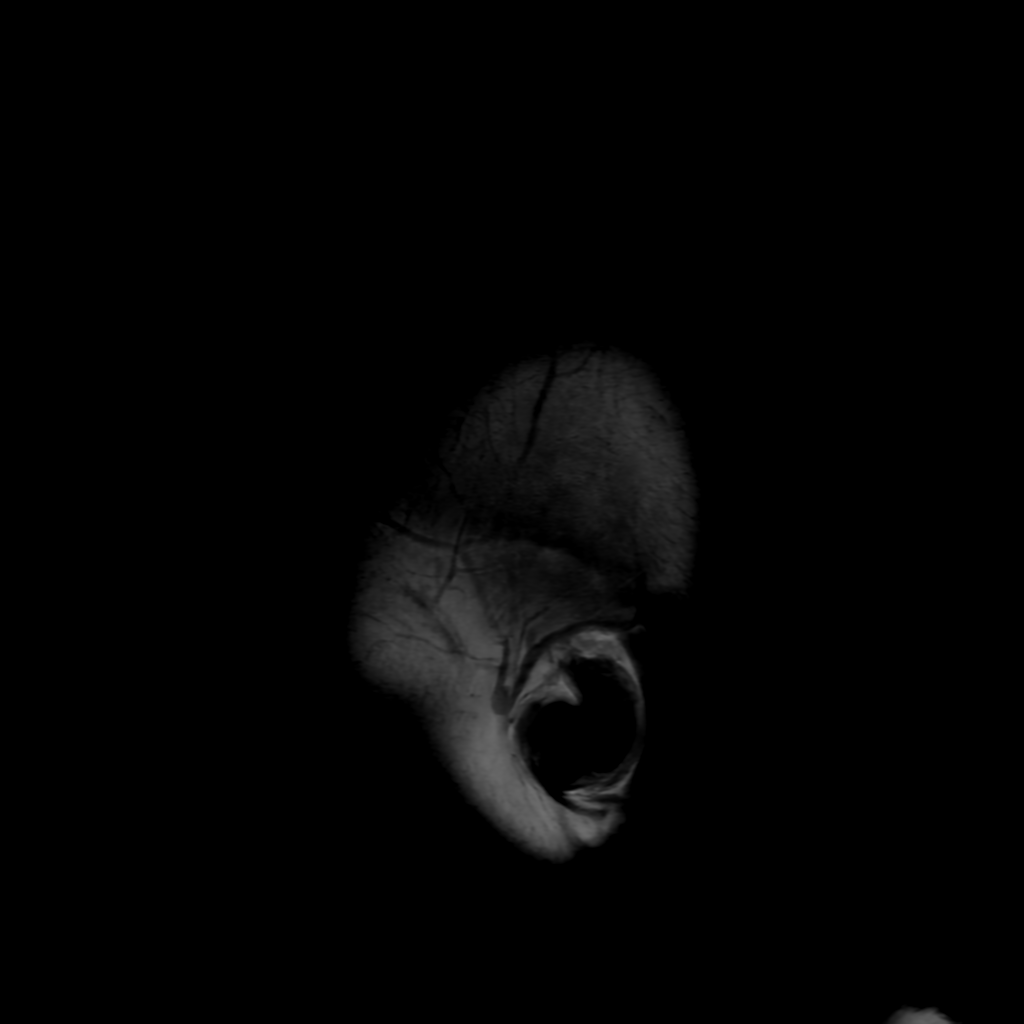
[im 25/25]
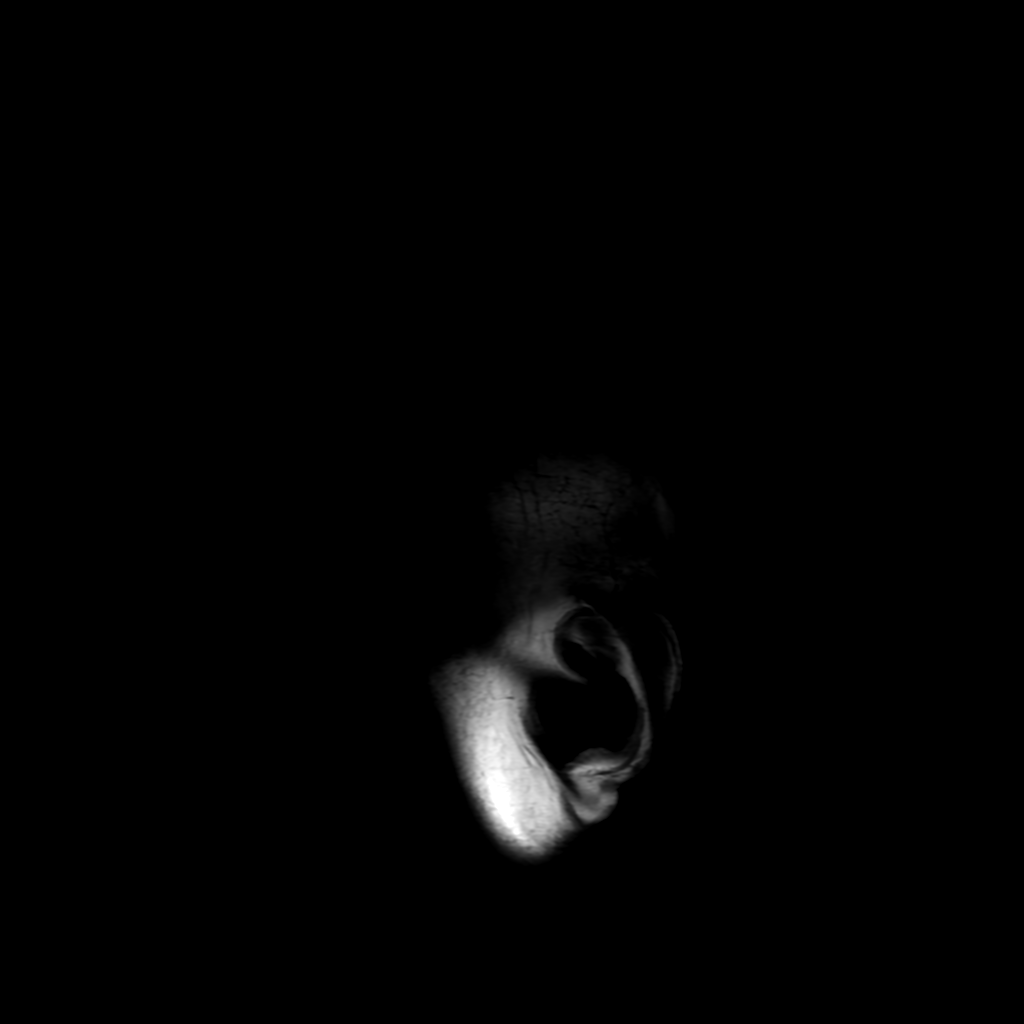

[Series 6: FLAIR · axial · 4.0mm · 0.45mm/px · z∈[-55,+93]mm · 3 of 35 slices shown (2 of 2)]
[im 1/35]
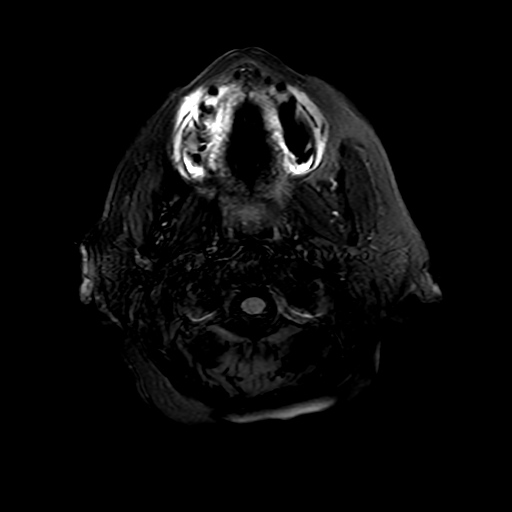
[im 18/35]
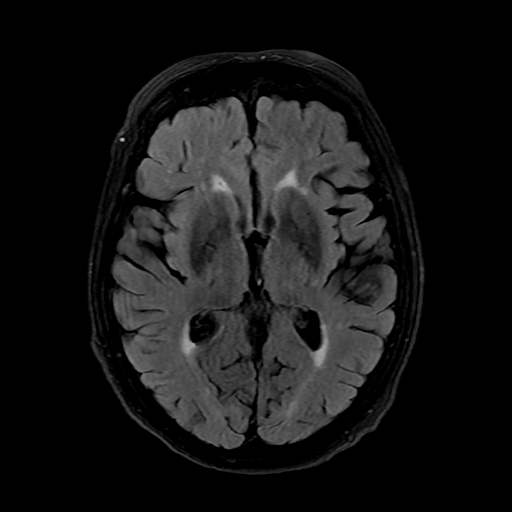
[im 35/35]
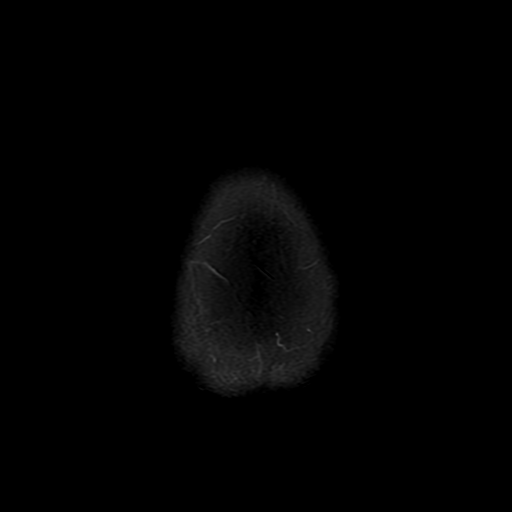

[Series 250: ADC · axial · 3.0mm · 0.94mm/px · z∈[-57,+95]mm · 5 of 52 slices shown (1 of 2)]
[im 1/52]
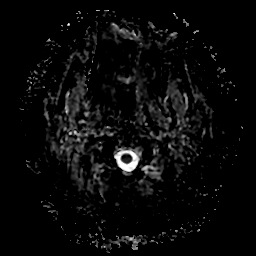
[im 13/52]
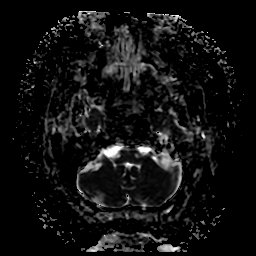
[im 26/52]
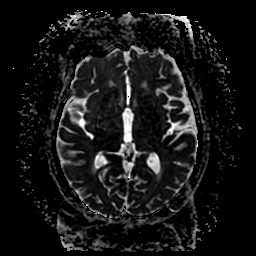
[im 39/52]
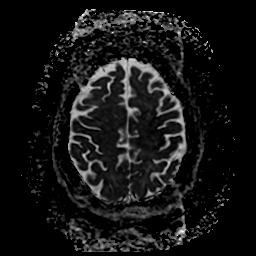
[im 52/52]
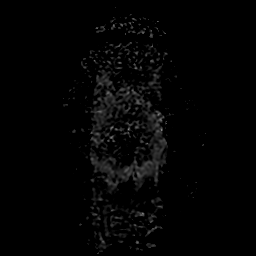

[Series 350: ADC · coronal · 4.0mm · 0.94mm/px · 3 of 37 slices shown (2 of 2)]
[im 1/37]
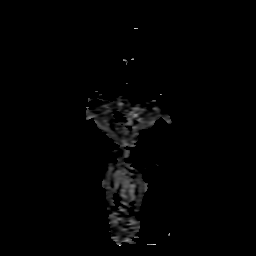
[im 19/37]
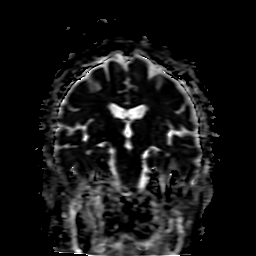
[im 37/37]
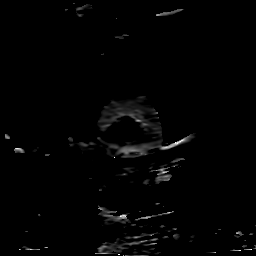

[29 of 48 positions shown; findings below may reference images not displayed]

FINDINGS: Brain: Ventricle size and cerebral volume normal for age. Moderate
white matter changes with scattered white matter hyperintensities
throughout both cerebral hemispheres. Brainstem and cerebellum
normal. Negative for hemorrhage or mass.

Negative for acute infarct.

Vascular: Normal arterial flow voids.

Skull and upper cervical spine: Negative

Sinuses/Orbits: Negative

Other: None
IMPRESSION: No acute abnormality. Moderate white matter changes consistent with
chronic microvascular ischemia.

## 2022-12-05 ENCOUNTER — Telehealth: Payer: Self-pay | Admitting: Family Medicine

## 2022-12-05 NOTE — Telephone Encounter (Signed)
Pt calling to check on progress of a form she dropped off. Advised to allow 5-7 working days for form to be completed and someone would call when ready for pick up

## 2022-12-08 NOTE — Telephone Encounter (Signed)
Completed.

## 2022-12-08 NOTE — Telephone Encounter (Signed)
Spoke with the patient and informed her PCP completed the form, she charged a $29 fee and this was left at the front desk for pick up.

## 2022-12-08 NOTE — Telephone Encounter (Signed)
Copy sent to be scanned. 

## 2022-12-21 ENCOUNTER — Other Ambulatory Visit: Payer: Self-pay | Admitting: *Deleted

## 2022-12-25 ENCOUNTER — Other Ambulatory Visit: Payer: Self-pay | Admitting: Family Medicine

## 2022-12-25 DIAGNOSIS — M1711 Unilateral primary osteoarthritis, right knee: Secondary | ICD-10-CM

## 2023-01-12 ENCOUNTER — Other Ambulatory Visit: Payer: Self-pay | Admitting: Family Medicine

## 2023-01-12 DIAGNOSIS — M797 Fibromyalgia: Secondary | ICD-10-CM

## 2023-01-18 ENCOUNTER — Encounter: Payer: Self-pay | Admitting: Family Medicine

## 2023-01-18 ENCOUNTER — Ambulatory Visit: Payer: PPO | Admitting: Family Medicine

## 2023-01-18 VITALS — BP 130/64 | HR 75 | Temp 97.7°F | Ht 65.0 in | Wt 163.1 lb

## 2023-01-18 DIAGNOSIS — L570 Actinic keratosis: Secondary | ICD-10-CM

## 2023-01-18 DIAGNOSIS — R350 Frequency of micturition: Secondary | ICD-10-CM

## 2023-01-18 DIAGNOSIS — F5104 Psychophysiologic insomnia: Secondary | ICD-10-CM

## 2023-01-18 DIAGNOSIS — R197 Diarrhea, unspecified: Secondary | ICD-10-CM

## 2023-01-18 DIAGNOSIS — L299 Pruritus, unspecified: Secondary | ICD-10-CM | POA: Diagnosis not present

## 2023-01-18 LAB — COMPREHENSIVE METABOLIC PANEL
ALT: 18 U/L (ref 0–35)
AST: 25 U/L (ref 0–37)
Albumin: 4.7 g/dL (ref 3.5–5.2)
Alkaline Phosphatase: 94 U/L (ref 39–117)
BUN: 14 mg/dL (ref 6–23)
CO2: 27 mEq/L (ref 19–32)
Calcium: 9.9 mg/dL (ref 8.4–10.5)
Chloride: 97 mEq/L (ref 96–112)
Creatinine, Ser: 0.93 mg/dL (ref 0.40–1.20)
GFR: 57 mL/min — ABNORMAL LOW (ref >60.00)
Glucose, Bld: 105 mg/dL — ABNORMAL HIGH (ref 70–99)
Potassium: 4.4 mEq/L (ref 3.5–5.1)
Sodium: 132 mEq/L — ABNORMAL LOW (ref 135–145)
Total Bilirubin: 0.6 mg/dL (ref 0.2–1.2)
Total Protein: 7.5 g/dL (ref 6.0–8.3)

## 2023-01-18 LAB — LIPASE: Lipase: 13 U/L (ref 11.0–59.0)

## 2023-01-18 MED ORDER — TRIAMCINOLONE ACETONIDE 0.1 % EX CREA: 1.0000 | TOPICAL_CREAM | Freq: Two times a day (BID) | CUTANEOUS | 2 refills | Status: DC | PRN

## 2023-01-18 NOTE — Progress Notes (Signed)
Established Patient Office Visit  Subjective   Patient ID: [name], female    DOB: 1940/09/16  Age: 82 y.o. MRN: [id number]  Chief Complaint  Patient presents with   Medical Management of Chronic Issues    Patient reports she she is sleeping much better. States that increasing the lyrica to twice a day did help, is taking the cyclobenzaprine, trazodone, and lyrica at night as well as the primidone for her tremor. States that she doesn't think that she is having any side effects.  Chronic pain -- pt is taking the tramadol once a day, sometimes 2-3 times a day depending on her pain level.   Patient reports she does have a new problem of itchiness, states that it has been going on for the last 2 months, especially at night, describes it has been mostly at night. There is no rash, just generalized skin itching.   Patient reports that she is having trouble with increased urinary and increased bowel movements, having several per day. States it is waking her from sleep and she sometimes has trouble getting to the bathroom on time.    Current Outpatient Medications  Medication Instructions   amLODipine (NORVASC) 5 mg, Oral, Daily   atenolol (TENORMIN) 25 MG tablet TAKE ONE TABLET BY MOUTH DAILY   cetirizine (ZYRTEC) 10 mg, Oral, Daily   cyclobenzaprine (FLEXERIL) 10 mg, Oral, 3 times daily PRN   furosemide (LASIX) 20 mg, Oral, Every other day   hydroxypropyl methylcellulose / hypromellose (ISOPTO TEARS / GONIOVISC) 2.5 % ophthalmic solution 1 drop, Both Eyes, As needed   Melatonin 20 mg, Oral, Daily at bedtime   pregabalin (LYRICA) 75 mg, Oral, 2 times daily   primidone (MYSOLINE) 50 mg, Oral, 2 times daily   simvastatin (ZOCOR) 40 mg, Oral, Daily at bedtime   sodium chloride (OCEAN) 0.65 % SOLN nasal spray 1 spray, Each Nare, As needed   traMADol (ULTRAM) 50 MG tablet TAKE TWO TABLETS BY MOUTH EVERY 12 HOURS AS NEEDED   traZODone (DESYREL) 50 MG tablet TAKE ONE TABLET BY MOUTH EVERY  NIGHT AT BEDTIME AS NEEDED FOR SLEEP   triamcinolone cream (KENALOG) 0.1 % 1 Application, Topical, 2 times daily PRN    Patient Active Problem List   Diagnosis Date Noted   DVT (deep venous thrombosis) (HCC) 04/26/2022   AKI (acute kidney injury) (HCC)    Pressure injury of skin 01/23/2021   OSA (obstructive sleep apnea) 08/23/2020   Dysphonia of essential tremor 06/21/2020   Essential tremor 06/21/2020   Chronic insomnia 06/21/2020   Status post total knee replacement, left 04/13/2017   Vitamin D deficiency 04/12/2017   History of chronic kidney disease 04/12/2017   Osteopenia of multiple sites 04/06/2017   Fibromyalgia syndrome 09/11/2016   Primary insomnia 09/11/2016   History of osteopenia 09/11/2016   Essential hypertension, benign 12/16/2013   Pure hypercholesterolemia 12/16/2013      Review of Systems  Constitutional:  Negative for chills, fever and weight loss.  Gastrointestinal:  Positive for diarrhea. Negative for abdominal pain, blood in stool, nausea and vomiting.  Genitourinary:  Positive for frequency. Negative for dysuria and hematuria.  All other systems reviewed and are negative.     Objective:     BP 130/64 (BP Location: Left Arm, Patient Position: Sitting, Cuff Size: Normal)   Pulse 75   Temp 97.7 F (36.5 C) (Oral)   Ht 5\' 5"  (1.651 m)   Wt 163 lb 1.6 oz (74 kg)   SpO2  95%   BMI 27.14 kg/m    Physical Exam Vitals reviewed.  Constitutional:      Appearance: Normal appearance. She is well-groomed and normal weight.  Eyes:     Conjunctiva/sclera: Conjunctivae normal.  Cardiovascular:     Rate and Rhythm: Normal rate and regular rhythm.     Heart sounds: S1 normal and S2 normal.  Pulmonary:     Effort: Pulmonary effort is normal.     Breath sounds: Normal breath sounds and air entry.  Musculoskeletal:     Right lower leg: No edema.     Left lower leg: No edema.  Skin:    Findings: No lesion or rash.  Neurological:     Mental Status: She  is alert and oriented to person, place, and time. Mental status is at baseline.     Gait: Gait is intact.  Psychiatric:        Mood and Affect: Mood and affect normal.        Speech: Speech normal.        Behavior: Behavior normal.        Judgment: Judgment normal.      The ASCVD Risk score (Arnett DK, et al., 2019) failed to calculate for the following reasons:   The 2019 ASCVD risk score is only valid for ages 90 to 71    Assessment & Plan:  Chronic insomnia Assessment & Plan: Pt is sleeping well now with the current medication. She is on 75 mg lyrica BID, cyclobenzaprine 10 mg at bedtime, and 50 mg trazodone at bedtime. Also takes primidone 50 mg BID for her tremors. She denies any side effects to the medication however she is reporting pruritus of skin for the last couple of months. There is no rash seen on exam today. We discussed the possibility that the itching is a side effect of one of the medications, however pt does not wish to change anything at this time. Will continue to monitor for signs of other side effects.   Pruritus of skin -     could be a side effect to the medications used for sleep, I will rx steroid cream  Triamcinolone Acetonide; Apply 1 Application topically 2 (two) times daily as needed (itching).  Dispense: 45 g; Refill: 2 -     Ambulatory referral to Dermatology  Actinic keratosis of left side of forehead -     Ambulatory referral to Dermatology  Urinary frequency -    new symptom, also related to her bowel function, will get a UA and CMP first before deciding if medication is needed.   Urinalysis, Routine w reflex microscopic -     Comprehensive metabolic panel  Diarrhea, unspecified type -    New symptom, again could be a medication side effect, will check lipase and CMP   Comprehensive metabolic panel -     Lipase     Return in about 2 months (around 03/21/2023) for annual physical exam.    Karie Georges, MD

## 2023-01-22 NOTE — Assessment & Plan Note (Signed)
Pt is sleeping well now with the current medication. She is on 75 mg lyrica BID, cyclobenzaprine 10 mg at bedtime, and 50 mg trazodone at bedtime. Also takes primidone 50 mg BID for her tremors. She denies any side effects to the medication however she is reporting pruritus of skin for the last couple of months. There is no rash seen on exam today. We discussed the possibility that the itching is a side effect of one of the medications, however pt does not wish to change anything at this time. Will continue to monitor for signs of other side effects.

## 2023-02-13 ENCOUNTER — Telehealth: Payer: Self-pay | Admitting: *Deleted

## 2023-02-13 ENCOUNTER — Other Ambulatory Visit: Payer: Self-pay | Admitting: Family Medicine

## 2023-02-13 DIAGNOSIS — M797 Fibromyalgia: Secondary | ICD-10-CM

## 2023-02-13 NOTE — Telephone Encounter (Signed)
[name] faxed a refill request for Atenolol 25mg -take 1 tablet by mouth daily-#90.  Message sent to PCP.

## 2023-02-14 MED ORDER — ATENOLOL 25 MG PO TABS: 25.0000 mg | ORAL_TABLET | Freq: Every day | ORAL | 0 refills | Status: DC

## 2023-02-14 NOTE — Telephone Encounter (Signed)
Rx done. 

## 2023-02-14 NOTE — Telephone Encounter (Signed)
Ok to refill 

## 2023-02-15 ENCOUNTER — Ambulatory Visit (INDEPENDENT_AMBULATORY_CARE_PROVIDER_SITE_OTHER): Payer: PPO | Admitting: Family

## 2023-02-15 ENCOUNTER — Encounter: Payer: Self-pay | Admitting: Family

## 2023-02-15 VITALS — BP 147/75 | HR 81 | Temp 97.8°F | Ht 65.0 in | Wt 166.2 lb

## 2023-02-15 DIAGNOSIS — U071 COVID-19: Secondary | ICD-10-CM | POA: Diagnosis not present

## 2023-02-15 LAB — POC COVID19 BINAXNOW: SARS Coronavirus 2 Ag: POSITIVE — AB

## 2023-02-15 MED ORDER — NIRMATRELVIR/RITONAVIR (PAXLOVID) TABLET (RENAL DOSING)
2.0000 | ORAL_TABLET | Freq: Two times a day (BID) | ORAL | 0 refills | Status: AC
Start: 2023-02-15 — End: 2023-02-20

## 2023-02-15 MED ORDER — BENZONATATE 100 MG PO CAPS
100.0000 mg | ORAL_CAPSULE | Freq: Two times a day (BID) | ORAL | 0 refills | Status: DC | PRN
Start: 2023-02-15 — End: 2023-03-27

## 2023-02-15 NOTE — Progress Notes (Signed)
Patient ID: [name], female    DOB: 1941/02/10, 82 y.o.   MRN: [id number]  Chief Complaint  Patient presents with   Sinus Problem    Pt c/o diarrhea, sweats, runny nose, cough and body aches. Present since Sunday afternoon. Fever on Monday and Tuesday.     HPI:      URI sx:  Pt c/o diarrhea, sweats, runny nose, cough and body aches. Present since Sunday afternoon. Fever on Monday and Tuesday. States she can't stop coughing sometimes once she starts, feels tickle every time she talks. Denies ever having Covid in past.  Assessment & Plan:  1. COVID-19 Sending Paxlovid, pt advised on use, how to take, & SE. Pt advised to hold her Zocor until after finishing the medication. Also may consider holding Primidone or reducing to every day from bid. Also sending in Tessalon pearles,pt has had in past. Advised of CDC guidelines for masking if out in public. OK to continue taking OTC sinus or pain meds. Encouraged to monitor & notify office of any worsening symptoms: increased shortness of breath, weakness, and signs of dehydration. Instructed to rest and hydrate well.   - POC COVID-19 - nirmatrelvir/ritonavir, renal dosing, (PAXLOVID) 10 x 150 MG & 10 x 100MG  TABS; Take 2 tablets by mouth 2 (two) times daily for 5 days. (Take nirmatrelvir 150 mg one tablet twice daily for 5 days and ritonavir 100 mg one tablet twice daily for 5 days) Patient GFR is 58.  Dispense: 20 tablet; Refill: 0 - benzonatate (TESSALON) 100 MG capsule; Take 1-2 capsules (100-200 mg total) by mouth 2 (two) times daily as needed for cough.  Dispense: 20 capsule; Refill: 0   Subjective:    Outpatient Medications Prior to Visit  Medication Sig Dispense Refill   amLODipine (NORVASC) 5 MG tablet Take 1 tablet (5 mg total) by mouth daily. 90 tablet 2   atenolol (TENORMIN) 25 MG tablet TAKE ONE TABLET BY MOUTH DAILY 90 tablet 1   atenolol (TENORMIN) 25 MG tablet Take 1 tablet (25 mg total) by mouth daily. 90 tablet 0   cetirizine  (ZYRTEC) 10 MG tablet Take 10 mg by mouth daily.     cyclobenzaprine (FLEXERIL) 10 MG tablet TAKE ONE TABLET BY MOUTH THREE TIMES A DAY AS NEEDED FOR MUSCLE SPASMS 60 tablet 2   furosemide (LASIX) 20 MG tablet Take 1 tablet (20 mg total) by mouth every other day. 45 tablet 3   hydroxypropyl methylcellulose / hypromellose (ISOPTO TEARS / GONIOVISC) 2.5 % ophthalmic solution Place 1 drop into both eyes as needed for dry eyes.     Melatonin 10 MG TABS Take 20 mg by mouth at bedtime.     pregabalin (LYRICA) 75 MG capsule TAKE 1 CAPSULE BY MOUTH TWICE A DAY 180 capsule 1   primidone (MYSOLINE) 50 MG tablet Take 1 tablet (50 mg total) by mouth 2 (two) times daily. 180 tablet 3   simvastatin (ZOCOR) 40 MG tablet Take 1 tablet (40 mg total) by mouth at bedtime. 90 tablet 2   sodium chloride (OCEAN) 0.65 % SOLN nasal spray Place 1 spray into both nostrils as needed for congestion.     traMADol (ULTRAM) 50 MG tablet TAKE TWO TABLETS BY MOUTH EVERY 12 HOURS AS NEEDED 120 tablet 0   traZODone (DESYREL) 50 MG tablet TAKE ONE TABLET BY MOUTH EVERY NIGHT AT BEDTIME AS NEEDED FOR SLEEP 90 tablet 3   triamcinolone cream (KENALOG) 0.1 % Apply 1 Application topically 2 (two)  times daily as needed (itching). 45 g 2   No facility-administered medications prior to visit.   Past Medical History:  Diagnosis Date   Anemia    BCC (basal cell carcinoma of skin) 03/27/2006   Above Left Upper Lip (MOH's)   Benign essential tremor    Chronic kidney disease    Essential hypertension, benign    Fibromyalgia    GERD (gastroesophageal reflux disease)    No longer a problem   Heart murmur    Hemorrhoids    Hyponatremia    secondary to hctz-recurrent March 2010   Low back pain    facet joint pain   Occipital neuralgia    Osteoarthritis    Osteopenia    Pneumonia    Pure hypercholesterolemia 12/16/2013   SCCA (squamous cell carcinoma) of skin 07/15/2002   Left Inner Cheek(in situ)   SCCA (squamous cell carcinoma)  of skin 05/06/2004   Right Wing Nose(in situ) -Cx3,5FU   SCCA (squamous cell carcinoma) of skin 01/09/2011   Above Right Upper Lip(in situ)-Cx3   SCCA (squamous cell carcinoma) of skin 01/05/2014   Left Temple(in situ)-Cx3,5FU   SCCA (squamous cell carcinoma) of skin 09/30/2014   Right Wrist(in situ)-tx p bx   Shortness of breath dyspnea    Sleep apnea    Mild sleep apnea   Superficial basal cell carcinoma (BCC) 08/17/1982   Right Side Upper Lip   Tuberculosis    as a child   Past Surgical History:  Procedure Laterality Date   TOTAL KNEE ARTHROPLASTY Left 11/08/2015   Procedure: LEFT TOTAL KNEE ARTHROPLASTY;  Surgeon: Ollen Gross, MD;  Location: [facility];  Service: Orthopedics;  Laterality: Left;   TOTAL KNEE ARTHROPLASTY Right 12/19/2021   Procedure: TOTAL KNEE ARTHROPLASTY;  Surgeon: Ollen Gross, MD;  Location: [facility];  Service: Orthopedics;  Laterality: Right;   TUBAL LIGATION     Allergies  Allergen Reactions   Propoxyphene Other (See Comments)   Erythromycin Base Other (See Comments)   Hctz [Hydrochlorothiazide] Other (See Comments)    Hyponatremia    Lisinopril Other (See Comments)    Hyponatremia   Sulfa Antibiotics Rash      Objective:    Physical Exam Vitals and nursing note reviewed.  Constitutional:      Appearance: Normal appearance. She is ill-appearing.     Interventions: Face mask in place.  HENT:     Right Ear: Tympanic membrane and ear canal normal.     Left Ear: Tympanic membrane and ear canal normal.     Nose:     Right Sinus: Frontal sinus tenderness present.     Left Sinus: Frontal sinus tenderness present.     Mouth/Throat:     Mouth: Mucous membranes are moist.     Pharynx: Posterior oropharyngeal erythema present. No pharyngeal swelling, oropharyngeal exudate or uvula swelling.     Tonsils: No tonsillar exudate or tonsillar abscesses.  Cardiovascular:     Rate and Rhythm: Normal rate and regular rhythm.  Pulmonary:     Effort: Pulmonary  effort is normal.     Breath sounds: Normal breath sounds.  Musculoskeletal:        General: Normal range of motion.  Lymphadenopathy:     Head:     Right side of head: No preauricular or posterior auricular adenopathy.     Left side of head: No preauricular or posterior auricular adenopathy.     Cervical: No cervical adenopathy.  Skin:    General: Skin is warm  and dry.  Neurological:     Mental Status: She is alert.  Psychiatric:        Mood and Affect: Mood normal.        Behavior: Behavior normal.    BP (!) 147/75   Pulse 81   Temp 97.8 F (36.6 C) (Temporal)   Ht 5\' 5"  (1.651 m)   Wt 166 lb 3.2 oz (75.4 kg)   SpO2 98%   BMI 27.66 kg/m  Wt Readings from Last 3 Encounters:  02/15/23 166 lb 3.2 oz (75.4 kg)  01/18/23 163 lb 1.6 oz (74 kg)  08/18/22 168 lb 6.4 oz (76.4 kg)      Dulce Sellar, NP

## 2023-02-15 NOTE — Patient Instructions (Addendum)
It was very nice to see you today.   I have sent your Covid treatment medication, Paxlovid, to your pharmacy. Call back if they are out of stock and need it sent elsewhere. DO NOT take your Simvastatin (Zocor) while taking the Paxlovid. Resume taking the day after you finish the medication. You may want to reduce your Primidone to once a day or not at all if tolerated as it can make the Paxlovid not as effective. I also have sent in Tessalon pearles to help with your cough.  Ok to continue taking Tylenol as needed for aches, pain or fever. Drink plenty of water! Shooting for 8 cups through the day.  Let us know if you are not feeling better after you finish the Paxlovid      PLEASE NOTE:  If you had any lab tests please let us know if you have not heard back within a few days. You may see your results on MyChart before we have a chance to review them but we will give you a call once they are reviewed by [place]. If we ordered any referrals today, please let us know if you have not heard from their office within the next week.

## 2023-02-16 ENCOUNTER — Telehealth: Payer: Self-pay

## 2023-02-16 NOTE — Telephone Encounter (Signed)
I called and spoke with pt, pt states she is feeling a little better but is very weak and unable to walk around much. Pt states she picked up paxlovid the same day of appointment and started it.

## 2023-02-16 NOTE — Telephone Encounter (Signed)
-----   Message from Dulce Sellar sent at 02/15/2023 11:28 AM EDT ----- Regarding: Call patient Call Ahyanna and ask how she is doing, did she start the Paxlovid?  Let me know, thx

## 2023-02-22 ENCOUNTER — Encounter (INDEPENDENT_AMBULATORY_CARE_PROVIDER_SITE_OTHER): Payer: Self-pay

## 2023-03-05 ENCOUNTER — Ambulatory Visit (INDEPENDENT_AMBULATORY_CARE_PROVIDER_SITE_OTHER): Payer: PPO | Admitting: Family Medicine

## 2023-03-05 ENCOUNTER — Ambulatory Visit (INDEPENDENT_AMBULATORY_CARE_PROVIDER_SITE_OTHER): Payer: PPO

## 2023-03-05 ENCOUNTER — Encounter: Payer: Self-pay | Admitting: Family Medicine

## 2023-03-05 VITALS — BP 112/62 | HR 76 | Temp 98.0°F | Resp 16 | Ht 65.0 in | Wt 168.1 lb

## 2023-03-05 DIAGNOSIS — R0989 Other specified symptoms and signs involving the circulatory and respiratory systems: Secondary | ICD-10-CM

## 2023-03-05 DIAGNOSIS — R5383 Other fatigue: Secondary | ICD-10-CM | POA: Diagnosis not present

## 2023-03-05 DIAGNOSIS — U099 Post covid-19 condition, unspecified: Secondary | ICD-10-CM | POA: Diagnosis not present

## 2023-03-05 NOTE — Patient Instructions (Addendum)
A few things to remember from today's visit:  Chest congestion - Plan: DG Chest 2 View  Fatigue, unspecified type  Post covid-19 condition, unspecified It is not uncommon to have fatigue and cough as well as congestion after COVID-19 infection. Chest x-ray will be obtained today. Keep appointment with your PCP.  Do not use My Chart to request refills or for acute issues that need immediate attention. If you send a my chart message, it may take a few days to be addressed, specially if I am not in the office.  Please be sure medication list is accurate. If a new problem present, please set up appointment sooner than planned today.

## 2023-03-05 NOTE — Progress Notes (Addendum)
ACUTE VISIT Chief Complaint  Patient presents with   chest congestion    Feeling tightness in chest, fatigue   HPI: [name] is a 82 y.o. female with PMHx significant for hypertension, essential tremor, OSA, hyperlipidemia, and fibromyalgia here today complaining of persistent of chest congestion, tightness and fatigue since being diagnosed with COVID on 02/14/21.  She reports fever and chills have resolved. She also had diarrhea until a week ago. She completed Paxlovid treatment.  Cough This is a recurrent problem. The current episode started 1 to 4 weeks ago. The problem has been unchanged. Associated symptoms include myalgias, postnasal drip, rhinorrhea, shortness of breath and wheezing. Pertinent negatives include no chest pain, chills, ear congestion, ear pain, fever, headaches, heartburn, hemoptysis, nasal congestion, rash, sore throat or weight loss. Nothing aggravates the symptoms. She has tried rest for the symptoms. Her past medical history is significant for pneumonia. There is no history of asthma, COPD or environmental allergies.  She is experiencing new onset wheezing and shortness of breath with activity since COVID 19 diagnosis.  Coughing up clear sputum. No hemoptysis,orthopnea,PND, or palpitations. She is taking Mucinex.  She reports  history of "serious lung diseases" during childhood, including tuberculosis, so she is concerned about lung complications from COVID 19 infection.  Her symptoms have not changed significantly since onset.   She is also c/o extreme fatigue since COVID infection, more than her baseline.  She has poor appetite but making efforts to eat/drink fluids.  She mentions history of kidney disease, attributed to long NSAID use for fibromyalgia, she also reports history of hyponatremia.  Lab Results  Component Value Date   NA 132 (L) 01/18/2023   CL 97 01/18/2023   K 4.4 01/18/2023   CO2 27 01/18/2023   BUN 14 01/18/2023   CREATININE  0.93 01/18/2023   GFR 57.00 (L) 01/18/2023   CALCIUM 9.9 01/18/2023   PHOS 4.1 01/26/2021   ALBUMIN 4.7 01/18/2023   GLUCOSE 105 (H) 01/18/2023   Review of Systems  Constitutional:  Negative for chills, fever and weight loss.  HENT:  Positive for postnasal drip and rhinorrhea. Negative for ear pain, sore throat and trouble swallowing.   Respiratory:  Positive for cough, shortness of breath and wheezing. Negative for hemoptysis.   Cardiovascular:  Negative for chest pain.  Gastrointestinal:  Negative for abdominal pain, heartburn, nausea and vomiting.  Endocrine: Negative for cold intolerance and heat intolerance.  Genitourinary:  Negative for decreased urine volume, dysuria and hematuria.  Musculoskeletal:  Positive for arthralgias and myalgias.  Skin:  Negative for rash.  Allergic/Immunologic: Negative for environmental allergies.  Neurological:  Negative for syncope and headaches.  See other pertinent positives and negatives in HPI.  Current Outpatient Medications on File Prior to Visit  Medication Sig Dispense Refill   amLODipine (NORVASC) 5 MG tablet Take 1 tablet (5 mg total) by mouth daily. 90 tablet 2   atenolol (TENORMIN) 25 MG tablet TAKE ONE TABLET BY MOUTH DAILY 90 tablet 1   atenolol (TENORMIN) 25 MG tablet Take 1 tablet (25 mg total) by mouth daily. 90 tablet 0   benzonatate (TESSALON) 100 MG capsule Take 1-2 capsules (100-200 mg total) by mouth 2 (two) times daily as needed for cough. 20 capsule 0   cetirizine (ZYRTEC) 10 MG tablet Take 10 mg by mouth daily.     cyclobenzaprine (FLEXERIL) 10 MG tablet TAKE ONE TABLET BY MOUTH THREE TIMES A DAY AS NEEDED FOR MUSCLE SPASMS 60 tablet 2  furosemide (LASIX) 20 MG tablet Take 1 tablet (20 mg total) by mouth every other day. 45 tablet 3   hydroxypropyl methylcellulose / hypromellose (ISOPTO TEARS / GONIOVISC) 2.5 % ophthalmic solution Place 1 drop into both eyes as needed for dry eyes.     Melatonin 10 MG TABS Take 20 mg by  mouth at bedtime.     pregabalin (LYRICA) 75 MG capsule TAKE 1 CAPSULE BY MOUTH TWICE A DAY 180 capsule 1   primidone (MYSOLINE) 50 MG tablet Take 1 tablet (50 mg total) by mouth 2 (two) times daily. 180 tablet 3   simvastatin (ZOCOR) 40 MG tablet Take 1 tablet (40 mg total) by mouth at bedtime. 90 tablet 2   sodium chloride (OCEAN) 0.65 % SOLN nasal spray Place 1 spray into both nostrils as needed for congestion.     traMADol (ULTRAM) 50 MG tablet TAKE TWO TABLETS BY MOUTH EVERY 12 HOURS AS NEEDED 120 tablet 0   traZODone (DESYREL) 50 MG tablet TAKE ONE TABLET BY MOUTH EVERY NIGHT AT BEDTIME AS NEEDED FOR SLEEP 90 tablet 3   triamcinolone cream (KENALOG) 0.1 % Apply 1 Application topically 2 (two) times daily as needed (itching). 45 g 2   No current facility-administered medications on file prior to visit.    Past Medical History:  Diagnosis Date   Anemia    BCC (basal cell carcinoma of skin) 03/27/2006   Above Left Upper Lip (MOH's)   Benign essential tremor    Chronic kidney disease    Essential hypertension, benign    Fibromyalgia    GERD (gastroesophageal reflux disease)    No longer a problem   Heart murmur    Hemorrhoids    Hyponatremia    secondary to hctz-recurrent March 2010   Low back pain    facet joint pain   Occipital neuralgia    Osteoarthritis    Osteopenia    Pneumonia    Pure hypercholesterolemia 12/16/2013   SCCA (squamous cell carcinoma) of skin 07/15/2002   Left Inner Cheek(in situ)   SCCA (squamous cell carcinoma) of skin 05/06/2004   Right Wing Nose(in situ) -Cx3,5FU   SCCA (squamous cell carcinoma) of skin 01/09/2011   Above Right Upper Lip(in situ)-Cx3   SCCA (squamous cell carcinoma) of skin 01/05/2014   Left Temple(in situ)-Cx3,5FU   SCCA (squamous cell carcinoma) of skin 09/30/2014   Right Wrist(in situ)-tx p bx   Shortness of breath dyspnea    Sleep apnea    Mild sleep apnea   Superficial basal cell carcinoma (BCC) 08/17/1982   Right Side  Upper Lip   Tuberculosis    as a child   Allergies  Allergen Reactions   Propoxyphene Other (See Comments)   Erythromycin Base Other (See Comments)   Hctz [Hydrochlorothiazide] Other (See Comments)    Hyponatremia    Lisinopril Other (See Comments)    Hyponatremia   Sulfa Antibiotics Rash    Social History   Socioeconomic History   Marital status: Divorced    Spouse name: Not on file   Number of children: 2   Years of education: Masters   Highest education level: Master's degree (e.g., MA, MS, MEng, MEd, MSW, MBA)  Occupational History   Occupation: Retired  Tobacco Use   Smoking status: Never   Smokeless tobacco: Never  Vaping Use   Vaping status: Never Used  Substance and Sexual Activity   Alcohol use: Yes    Alcohol/week: 7.0 standard drinks of alcohol    Types:  7 Standard drinks or equivalent per week    Comment: Cocktail daily   Drug use: No   Sexual activity: Not on file  Other Topics Concern   Not on file  Social History Narrative   Denies caffeine use    Social Determinants of Health   Financial Resource Strain: Low Risk  (08/14/2022)   Overall Financial Resource Strain (CARDIA)    Difficulty of Paying Living Expenses: Not hard at all  Food Insecurity: No Food Insecurity (08/14/2022)   Hunger Vital Sign    Worried About Running Out of Food in the Last Year: Never true    Ran Out of Food in the Last Year: Never true  Transportation Needs: No Transportation Needs (08/14/2022)   PRAPARE - Administrator, Civil Service (Medical): No    Lack of Transportation (Non-Medical): No  Physical Activity: Sufficiently Active (08/14/2022)   Exercise Vital Sign    Days of Exercise per Week: 5 days    Minutes of Exercise per Session: 40 min  Stress: No Stress Concern Present (08/14/2022)   [employer] of Occupational Health - Occupational Stress Questionnaire    Feeling of Stress : Not at all  Social Connections: Moderately Integrated (08/14/2022)   Social  Connection and Isolation Panel [NHANES]    Frequency of Communication with Friends and Family: More than three times a week    Frequency of Social Gatherings with Friends and Family: Twice a week    Attends Religious Services: More than 4 times per year    Active Member of Clubs or Organizations: Yes    Attends Banker Meetings: More than 4 times per year    Marital Status: Divorced   Vitals:   03/05/23 1514  BP: 112/62  Pulse: 76  Resp: 16  Temp: 98 F (36.7 C)  SpO2: 94%   Body mass index is 27.98 kg/m.  Physical Exam Vitals and nursing note reviewed.  Constitutional:      General: She is not in acute distress.    Appearance: She is well-developed.  HENT:     Head: Normocephalic and atraumatic.     Mouth/Throat:     Mouth: Mucous membranes are moist.     Pharynx: Oropharynx is clear.  Eyes:     Conjunctiva/sclera: Conjunctivae normal.  Cardiovascular:     Rate and Rhythm: Normal rate and regular rhythm.     Heart sounds: No murmur heard. Pulmonary:     Effort: Pulmonary effort is normal. No respiratory distress.     Breath sounds: Normal breath sounds.  Abdominal:     Palpations: Abdomen is soft. There is no mass.     Tenderness: There is no abdominal tenderness.  Lymphadenopathy:     Cervical: No cervical adenopathy.  Skin:    General: Skin is warm.     Findings: No erythema or rash.  Neurological:     General: No focal deficit present.     Mental Status: She is alert and oriented to person, place, and time.     Comments: Otherwise stable gait,mildly antalgic, not assisted.  Psychiatric:        Mood and Affect: Affect normal. Mood is anxious.   ASSESSMENT AND PLAN:  [name] was seen today for chest congestion and fatigue that started when Dx'ed with COVID-19 infection on 02/15/2023.  Chest congestion Reporting some SOB with activity, ? Deconditioning. Lung auscultation normal today. Explained that cough and congestion sometimes persist a few  weeks after acute respiratory  illness. Adequate hydration to continue. Continue plain Mucinex  Instructed about warning signs.  -     DG Chest 2 View; Future  Fatigue, unspecified type Aggravated by recent COVID 19 infection. Explained that it is not uncommon to have fatigue for weeks and sometimes months after Covid 19 infection. She prefers to hold on blood work, she has an appt with her PCP next week. Stressed the importance of adequate hydration and food intake, specially protein, fruit,and vegetable.  Post covid-19 condition, unspecified Most symptoms have resolved.  Monitor for new symptoms.  Return if symptoms worsen or fail to improve, for keep next appointment.  Aleera Gilcrease G. [place], MD  [facility]. [place] office.

## 2023-03-11 ENCOUNTER — Other Ambulatory Visit: Payer: Self-pay | Admitting: Family Medicine

## 2023-03-11 DIAGNOSIS — M1711 Unilateral primary osteoarthritis, right knee: Secondary | ICD-10-CM

## 2023-03-16 ENCOUNTER — Ambulatory Visit
Admission: RE | Admit: 2023-03-16 | Discharge: 2023-03-16 | Disposition: A | Discharge status: Home / Self Care | Payer: PPO | Source: Ambulatory Visit | Attending: Nurse Practitioner | Admitting: Nurse Practitioner

## 2023-03-16 ENCOUNTER — Telehealth: Payer: Self-pay | Admitting: Family Medicine

## 2023-03-16 DIAGNOSIS — M79604 Pain in right leg: Secondary | ICD-10-CM

## 2023-03-16 DIAGNOSIS — I82401 Acute embolism and thrombosis of unspecified deep veins of right lower extremity: Secondary | ICD-10-CM

## 2023-03-16 DIAGNOSIS — R0609 Other forms of dyspnea: Secondary | ICD-10-CM

## 2023-03-16 DIAGNOSIS — I10 Essential (primary) hypertension: Secondary | ICD-10-CM

## 2023-03-16 MED ORDER — IOPAMIDOL (ISOVUE-370) INJECTION 76%
75.0000 mL | Freq: Once | INTRAVENOUS | Status: AC | PRN
  Administered 2023-03-16: 75 mL via INTRAVENOUS

## 2023-03-16 NOTE — Telephone Encounter (Signed)
Pt will reach out to NP [employer]

## 2023-03-16 NOTE — Telephone Encounter (Signed)
Noted  

## 2023-03-16 NOTE — Telephone Encounter (Addendum)
Pt is calling and would like to know the reason why chest ct was ordered

## 2023-03-23 ENCOUNTER — Encounter: Payer: PPO | Admitting: Family Medicine

## 2023-03-27 ENCOUNTER — Ambulatory Visit (INDEPENDENT_AMBULATORY_CARE_PROVIDER_SITE_OTHER): Payer: PPO | Admitting: Family Medicine

## 2023-03-27 ENCOUNTER — Encounter: Payer: Self-pay | Admitting: Family Medicine

## 2023-03-27 ENCOUNTER — Ambulatory Visit: Payer: PPO

## 2023-03-27 VITALS — BP 152/80 | HR 105 | Temp 98.1°F | Resp 16 | Ht 65.0 in | Wt 168.5 lb

## 2023-03-27 DIAGNOSIS — J988 Other specified respiratory disorders: Secondary | ICD-10-CM

## 2023-03-27 DIAGNOSIS — I10 Essential (primary) hypertension: Secondary | ICD-10-CM | POA: Diagnosis not present

## 2023-03-27 DIAGNOSIS — R059 Cough, unspecified: Secondary | ICD-10-CM

## 2023-03-27 LAB — POC COVID19 BINAXNOW: SARS Coronavirus 2 Ag: NEGATIVE

## 2023-03-27 LAB — POCT INFLUENZA A/B
Influenza A, POC: NEGATIVE
Influenza B, POC: NEGATIVE

## 2023-03-27 MED ORDER — DOXYCYCLINE HYCLATE 100 MG PO TABS
100.0000 mg | ORAL_TABLET | Freq: Two times a day (BID) | ORAL | 0 refills | Status: AC
Start: 2023-03-27 — End: 2023-04-03

## 2023-03-27 MED ORDER — ALBUTEROL SULFATE HFA 108 (90 BASE) MCG/ACT IN AERS
2.0000 | INHALATION_SPRAY | Freq: Four times a day (QID) | RESPIRATORY_TRACT | 0 refills | Status: DC | PRN
Start: 2023-03-27 — End: 2024-05-16

## 2023-03-27 MED ORDER — BENZONATATE 100 MG PO CAPS: 100.0000 mg | ORAL_CAPSULE | Freq: Two times a day (BID) | ORAL | 0 refills | Status: DC | PRN

## 2023-03-27 MED ORDER — IPRATROPIUM-ALBUTEROL 0.5-2.5 (3) MG/3ML IN SOLN
1.5000 mL | Freq: Once | RESPIRATORY_TRACT | Status: AC
Start: 2023-03-27 — End: 2023-03-27
  Administered 2023-03-27: 1.5 mL via RESPIRATORY_TRACT

## 2023-03-27 MED ORDER — PREDNISONE 20 MG PO TABS
40.0000 mg | ORAL_TABLET | Freq: Every day | ORAL | 0 refills | Status: AC
Start: 2023-03-27 — End: 2023-04-01

## 2023-03-27 NOTE — Progress Notes (Signed)
ACUTE VISIT Chief Complaint  Patient presents with   Cough    Fever, symptoms started Thursday evening. Had covid in August.    HPI: [name] is a 82 y.o. female  with PMHx significant for HTN, essential tremor, OSA, HLD, and fibromyalgia, who is here today complaining of 5 days of fever,cough, congestion, rhinorrhea, wheezing, and sob. Symptoms started Thursday evening.  Cough This is a new problem. The current episode started in the past 7 days. The problem has been gradually worsening. Associated symptoms include chills, ear congestion, a fever (101.4 this morning), headaches, myalgias, nasal congestion, postnasal drip, rhinorrhea, shortness of breath and wheezing. Pertinent negatives include no chest pain, ear pain, heartburn, hemoptysis or rash. Nothing aggravates the symptoms.   She reports a fever of 101.4 this morning  Took Tylenol at 8:30 am and is drinking plenty water to improve her fever Her headache is local to temple/parietal region.  She also endorses some associated chest tightness.  She endorses diarrhea, has had about 4 bowel movements today.  No associated abdominal pain, nausea, or vomiting. She took Tylenol last this morning around 830.  She reports recent exposure from a friend of her grandson on Saturday, but notes they went to an outdoor festival.   She has previously used an inhaler when she had bronchitis.  Last month she was diagnosed with COVID-19 infection, symptoms resolved completely.  Review of Systems  Constitutional:  Positive for chills and fever (101.4 this morning).  HENT:  Positive for congestion, postnasal drip and rhinorrhea. Negative for ear pain.   Respiratory:  Positive for cough, shortness of breath and wheezing. Negative for hemoptysis.   Cardiovascular:  Negative for chest pain.  Gastrointestinal:  Positive for diarrhea. Negative for abdominal pain, heartburn, nausea and vomiting.  Musculoskeletal:  Positive for myalgias.  Skin:   Negative for rash.  Neurological:  Positive for headaches.  See other pertinent positives and negatives in HPI.  Current Outpatient Medications on File Prior to Visit  Medication Sig Dispense Refill   amLODipine (NORVASC) 5 MG tablet Take 1 tablet (5 mg total) by mouth daily. 90 tablet 2   atenolol (TENORMIN) 25 MG tablet TAKE ONE TABLET BY MOUTH DAILY 90 tablet 1   atenolol (TENORMIN) 25 MG tablet Take 1 tablet (25 mg total) by mouth daily. 90 tablet 0   benzonatate (TESSALON) 100 MG capsule Take 1-2 capsules (100-200 mg total) by mouth 2 (two) times daily as needed for cough. 20 capsule 0   cetirizine (ZYRTEC) 10 MG tablet Take 10 mg by mouth daily.     cyclobenzaprine (FLEXERIL) 10 MG tablet TAKE ONE TABLET BY MOUTH THREE TIMES A DAY AS NEEDED FOR MUSCLE SPASMS 60 tablet 2   furosemide (LASIX) 20 MG tablet Take 1 tablet (20 mg total) by mouth every other day. 45 tablet 3   hydroxypropyl methylcellulose / hypromellose (ISOPTO TEARS / GONIOVISC) 2.5 % ophthalmic solution Place 1 drop into both eyes as needed for dry eyes.     Melatonin 10 MG TABS Take 20 mg by mouth at bedtime.     pregabalin (LYRICA) 75 MG capsule TAKE 1 CAPSULE BY MOUTH TWICE A DAY 180 capsule 1   primidone (MYSOLINE) 50 MG tablet Take 1 tablet (50 mg total) by mouth 2 (two) times daily. 180 tablet 3   simvastatin (ZOCOR) 40 MG tablet Take 1 tablet (40 mg total) by mouth at bedtime. 90 tablet 2   sodium chloride (OCEAN) 0.65 % SOLN nasal spray  Place 1 spray into both nostrils as needed for congestion.     traMADol (ULTRAM) 50 MG tablet Take 1 tablet (50 mg total) by mouth every 8 (eight) hours as needed. 120 tablet 0   traZODone (DESYREL) 50 MG tablet TAKE ONE TABLET BY MOUTH EVERY NIGHT AT BEDTIME AS NEEDED FOR SLEEP 90 tablet 3   triamcinolone cream (KENALOG) 0.1 % Apply 1 Application topically 2 (two) times daily as needed (itching). 45 g 2   No current facility-administered medications on file prior to visit.   Past  Medical History:  Diagnosis Date   Anemia    BCC (basal cell carcinoma of skin) 03/27/2006   Above Left Upper Lip (MOH's)   Benign essential tremor    Chronic kidney disease    Essential hypertension, benign    Fibromyalgia    GERD (gastroesophageal reflux disease)    No longer a problem   Heart murmur    Hemorrhoids    Hyponatremia    secondary to hctz-recurrent March 2010   Low back pain    facet joint pain   Occipital neuralgia    Osteoarthritis    Osteopenia    Pneumonia    Pure hypercholesterolemia 12/16/2013   SCCA (squamous cell carcinoma) of skin 07/15/2002   Left Inner Cheek(in situ)   SCCA (squamous cell carcinoma) of skin 05/06/2004   Right Wing Nose(in situ) -Cx3,5FU   SCCA (squamous cell carcinoma) of skin 01/09/2011   Above Right Upper Lip(in situ)-Cx3   SCCA (squamous cell carcinoma) of skin 01/05/2014   Left Temple(in situ)-Cx3,5FU   SCCA (squamous cell carcinoma) of skin 09/30/2014   Right Wrist(in situ)-tx p bx   Shortness of breath dyspnea    Sleep apnea    Mild sleep apnea   Superficial basal cell carcinoma (BCC) 08/17/1982   Right Side Upper Lip   Tuberculosis    as a child   Allergies  Allergen Reactions   Propoxyphene Other (See Comments)   Erythromycin Base Other (See Comments)   Hctz [Hydrochlorothiazide] Other (See Comments)    Hyponatremia    Lisinopril Other (See Comments)    Hyponatremia   Sulfa Antibiotics Rash   Social History   Socioeconomic History   Marital status: Divorced    Spouse name: Not on file   Number of children: 2   Years of education: Masters   Highest education level: Master's degree (e.g., MA, MS, MEng, MEd, MSW, MBA)  Occupational History   Occupation: Retired  Tobacco Use   Smoking status: Never   Smokeless tobacco: Never  Vaping Use   Vaping status: Never Used  Substance and Sexual Activity   Alcohol use: Yes    Alcohol/week: 7.0 standard drinks of alcohol    Types: 7 Standard drinks or equivalent  per week    Comment: Cocktail daily   Drug use: No   Sexual activity: Not on file  Other Topics Concern   Not on file  Social History Narrative   Denies caffeine use    Social Determinants of Health   Financial Resource Strain: Low Risk  (08/14/2022)   Overall Financial Resource Strain (CARDIA)    Difficulty of Paying Living Expenses: Not hard at all  Food Insecurity: No Food Insecurity (08/14/2022)   Hunger Vital Sign    Worried About Running Out of Food in the Last Year: Never true    Ran Out of Food in the Last Year: Never true  Transportation Needs: No Transportation Needs (08/14/2022)   PRAPARE -  Administrator, Civil Service (Medical): No    Lack of Transportation (Non-Medical): No  Physical Activity: Sufficiently Active (08/14/2022)   Exercise Vital Sign    Days of Exercise per Week: 5 days    Minutes of Exercise per Session: 40 min  Stress: No Stress Concern Present (08/14/2022)   [employer] of Occupational Health - Occupational Stress Questionnaire    Feeling of Stress : Not at all  Social Connections: Moderately Integrated (08/14/2022)   Social Connection and Isolation Panel [NHANES]    Frequency of Communication with Friends and Family: More than three times a week    Frequency of Social Gatherings with Friends and Family: Twice a week    Attends Religious Services: More than 4 times per year    Active Member of Clubs or Organizations: Yes    Attends Banker Meetings: More than 4 times per year    Marital Status: Divorced    Vitals:   03/27/23 1424 03/27/23 1515  BP: (!) 160/80 (!) 152/80  Pulse: (!) 105   Resp: 16   Temp: 98.1 F (36.7 C)   SpO2: 90% 93%   Body mass index is 28.04 kg/m.  Physical Exam Vitals and nursing note reviewed.  Constitutional:      General: She is not in acute distress.    Appearance: She is well-developed. She is not ill-appearing.  HENT:     Head: Normocephalic and atraumatic.     Right Ear: Tympanic  membrane, ear canal and external ear normal.     Left Ear: Tympanic membrane, ear canal and external ear normal.     Nose: Congestion and rhinorrhea present.     Right Sinus: No maxillary sinus tenderness or frontal sinus tenderness.     Left Sinus: No maxillary sinus tenderness or frontal sinus tenderness.     Mouth/Throat:     Mouth: Mucous membranes are normal. Mucous membranes are moist.     Pharynx: Oropharynx is clear. Posterior oropharyngeal erythema (mild) present. No pharyngeal swelling or oropharyngeal exudate.  Eyes:     Extraocular Movements: EOM normal.     Conjunctiva/sclera: Conjunctivae normal.  Cardiovascular:     Rate and Rhythm: Regular rhythm. Tachycardia present.     Heart sounds: No murmur heard. Pulmonary:     Effort: Pulmonary effort is normal. No respiratory distress.     Breath sounds: No stridor. Wheezing, rhonchi and rales (Fine rales right upper field) present.  Abdominal:     Palpations: Abdomen is soft. There is no mass.     Tenderness: There is no abdominal tenderness.  Musculoskeletal:        General: No edema.  Lymphadenopathy:     Head:     Right side of head: No submandibular adenopathy.     Left side of head: No submandibular adenopathy.     Cervical: No cervical adenopathy.  Skin:    General: Skin is warm.     Findings: No erythema or rash.  Neurological:     Mental Status: She is alert and oriented to person, place, and time.     Coordination: Coordination normal.  Psychiatric:        Mood and Affect: Affect normal. Mood is anxious.    ASSESSMENT AND PLAN:  [name] was seen today for acute respiratory symptoms.  Cough, unspecified type Explained that cough and congestion can last a few more days and even weeks after acute symptoms have resolved. I sent another prescription for benzonatate  100 mg capsule to take 1 to 2 capsules twice daily as needed.  -     POC COVID-19 BinaxNow -     POCT Influenza A/B -     DG Chest 2 View;  Future  Respiratory tract infection Here in the office and after verbal consent, DuoNeb breathing treatment was given, wheezing resolved, transmitted upper airway sounds noted, fine rales still present. We discussed possible etiologies, it could be viral but because she had been having fever for 5 days and fine rales noted on exam I recommend empiric antibiotic treatment with doxycycline.  We discussed some side effects, including worsening diarrhea. If chest x-ray showed consolidation/infiltrates, will add Augmentin. Because of wheezing, which improved with DuoNeb treatment here in the office, recommend prednisone 40 mg with breakfast for 3 to 5 days. Albuterol inh 2 puff every 6 hours for a week then as needed for wheezing or shortness of breath.  OTC plain Mucinex to help with cough. Continue adequate hydration and rest. Continue monitoring temperature. She was clearly instructed about warning signs. CXR I do not appreciate infiltrates or consolidations, peribronchial congestion. Pending report.  -     CBC with Differential/Platelet; Future -     DG Chest 2 View; Future -     Doxycycline Hyclate; Take 1 tablet (100 mg total) by mouth 2 (two) times daily for 7 days.  Dispense: 14 tablet; Refill: 0 -     Albuterol Sulfate HFA; Inhale 2 puffs into the lungs every 6 (six) hours as needed for wheezing or shortness of breath.  Dispense: 8 g; Refill: 0 -     predniSONE; Take 2 tablets (40 mg total) by mouth daily with breakfast for 5 days.  Dispense: 10 tablet; Refill: 0 -     Ipratropium-Albuterol  Essential hypertension, benign BP elevated today at 116/80. BP during prior visit, 03/05/2023, was 112/62. Continue atenolol 25 mg daily and amlodipine 5 mg daily.. Follow-up with PCP in 7 days.  Return in about 1 week (around 04/03/2023) for Respi infec with PCP.  I,Rachel Rivera,acting as a scribe for Miraya Cudney [place], MD.,have documented all relevant documentation on the behalf of Briana Merriott [place],  MD,as directed by  Kassandra Meriweather [place], MD while in the presence of Corisa Montini [place], MD.  I, Sahir Tolson [place], MD, have reviewed all documentation for this visit. The documentation on 03/27/23 for the exam, diagnosis, procedures, and orders are all accurate and complete.  Jeremyah Jelley G. [place], MD  [facility]. [place] office.

## 2023-03-27 NOTE — Patient Instructions (Addendum)
A few things to remember from today's visit:  Cough, unspecified type - Plan: POC COVID-19, POCT Influenza A/B, DG Chest 2 View  Respiratory tract infection - Plan: CBC with Differential/Platelet, DG Chest 2 View  Take Prednisone with breakfast. Plain Mucinex. Doxycycline 2 times daily, no at the same time that Prednisone. Will add another antibiotic depending of X ray result. It can aggravate diarrhea.  Albuterol inh 2 puff every 6 hours for a week then as needed for wheezing or shortness of breath.   Do not use My Chart to request refills or for acute issues that need immediate attention. If you send a my chart message, it may take a few days to be addressed, specially if I am not in the office.  Please be sure medication list is accurate. If a new problem present, please set up appointment sooner than planned today.

## 2023-03-29 ENCOUNTER — Encounter: Payer: Self-pay | Admitting: Family Medicine

## 2023-03-29 ENCOUNTER — Ambulatory Visit (INDEPENDENT_AMBULATORY_CARE_PROVIDER_SITE_OTHER): Payer: PPO | Admitting: Family Medicine

## 2023-03-29 VITALS — BP 150/70 | HR 63 | Temp 98.7°F | Ht 65.0 in

## 2023-03-29 DIAGNOSIS — J208 Acute bronchitis due to other specified organisms: Secondary | ICD-10-CM

## 2023-03-29 MED ORDER — IPRATROPIUM-ALBUTEROL 0.5-2.5 (3) MG/3ML IN SOLN
3.0000 mL | Freq: Once | RESPIRATORY_TRACT | Status: AC
Start: 2023-03-29 — End: ?

## 2023-03-29 MED ORDER — IPRATROPIUM-ALBUTEROL 0.5-2.5 (3) MG/3ML IN SOLN
3.0000 mL | Freq: Once | RESPIRATORY_TRACT | Status: AC
Start: 2023-03-29 — End: 2023-03-29
  Administered 2023-03-29: 3 mL via RESPIRATORY_TRACT

## 2023-03-29 MED ORDER — ALBUTEROL SULFATE (2.5 MG/3ML) 0.083% IN NEBU
2.5000 mg | INHALATION_SOLUTION | Freq: Four times a day (QID) | RESPIRATORY_TRACT | 0 refills | Status: DC | PRN
Start: 2023-03-29 — End: 2024-05-16

## 2023-03-29 NOTE — Progress Notes (Signed)
Acute Office Visit  Subjective:     Patient ID: [name], female    DOB: 1941-05-04, 82 y.o.   MRN: [id number]  Chief Complaint  Patient presents with   Cough    Productive with green sputum x1 day, seen by Dr Swaziland 2 days ago    Patient was seen to 2 days ago and was diagnosed with upper respiratory infection/ acute bronchitis. She was given prednisone, antibiotics, and an inhaler. States that she is feeling a little better but she is still reporting coughing that is relatively severe. States that she isn't able to use the inhaler correctly due to her arthritis-- having trouble dispensing the medication. She rpeots that the breathing treatment she was given in the office helped a lot. We discussed getting her a nebulizer machine for home use and she likes this idea.     Review of Systems  All other systems reviewed and are negative.       Objective:    BP (!) 150/70 (BP Location: Right Arm, Patient Position: Sitting, Cuff Size: Large)   Pulse 63   Temp 98.7 F (37.1 C) (Oral)   Ht 5\' 5"  (1.651 m)   SpO2 93%   BMI 28.04 kg/m    Physical Exam Vitals reviewed.  Constitutional:      Appearance: Normal appearance.  Cardiovascular:     Rate and Rhythm: Normal rate and regular rhythm.     Heart sounds: Normal heart sounds.  Pulmonary:     Breath sounds: Wheezing (inspiratory and expiratory wheezing scattered throughout the lung fields) present.  Neurological:     Mental Status: She is alert.     No results found for any visits on 03/29/23.      Assessment & Plan:   Problem List Items Addressed This Visit   None Visit Diagnoses     Acute bronchitis due to other specified organisms    -  Primary   Relevant Medications   albuterol (PROVENTIL) (2.5 MG/3ML) 0.083% nebulizer solution   ipratropium-albuterol (DUONEB) 0.5-2.5 (3) MG/3ML nebulizer solution 3 mL   Other Relevant Orders   For home use only DME Nebulizer machine     Patient is only on day 2 of  treatment, however she continues to report severe coughing and chest tightness. There is significant coarse wheezing on exam, will give another breathing treatment in the office today to help her breathe better and will order one for home use for this acute illness. Agree with the prednisone and abx treatment that was prescribed 2 days ago.   Meds ordered this encounter  Medications   albuterol (PROVENTIL) (2.5 MG/3ML) 0.083% nebulizer solution    Sig: Take 3 mLs (2.5 mg total) by nebulization every 6 (six) hours as needed for wheezing or shortness of breath.    Dispense:  120 mL    Refill:  0    Falied albuterol inhaler, still wheezing quite a bit today.   ipratropium-albuterol (DUONEB) 0.5-2.5 (3) MG/3ML nebulizer solution 3 mL    Return if symptoms worsen or fail to improve.  Karie Georges, MD

## 2023-03-30 ENCOUNTER — Telehealth: Payer: Self-pay | Admitting: Family Medicine

## 2023-03-30 NOTE — Telephone Encounter (Signed)
Pt would like to know what medical supply is she getting her nebulizer from? Pt says she still has not heard from them? Pt is afraid she will be without a nebulizer the entire weekend. CMA was unavailable. Please return Pt's call at your earliest convenience.

## 2023-03-30 NOTE — Telephone Encounter (Signed)
Message sent to PCP for further instructions for the patient as the order was just sent to [employer] yesterday and will take some time to be processed and sent to the patient.

## 2023-04-02 ENCOUNTER — Telehealth: Payer: Self-pay | Admitting: Family Medicine

## 2023-04-02 NOTE — Telephone Encounter (Signed)
No she doesn't have to keep that appointment, we just need to wait for the nebulizer

## 2023-04-02 NOTE — Telephone Encounter (Signed)
Pt is calling and would like to know if she should keep her 1 wk with dr Swaziland. Pt would like dr Casimiro Needle advice

## 2023-04-02 NOTE — Telephone Encounter (Signed)
Patient informed of the message below.

## 2023-04-02 NOTE — Telephone Encounter (Signed)
Patient informed of the message below and the appt was cancelled.

## 2023-04-02 NOTE — Telephone Encounter (Signed)
Just have her continue the inhaler until the nebulizer is approved.

## 2023-04-03 ENCOUNTER — Ambulatory Visit: Payer: PPO | Admitting: Family Medicine

## 2023-04-06 NOTE — Procedures (Signed)
[facility]  [name] 82 year old female Aug 06, 1940   HOME SLEEP TEST REPORT ( by Watch PAT)   STUDY DATE:  04-19-22 ORDERING CLINICIAN: Melvyn Novas, MD  CLINICAL INFORMATION/HISTORY:       FINDINGS:   Sleep Summary:   Total Recording Time (hours, min):   6 h 53 m     Total Sleep Time (hours, min):   4 h 53 m              Percent REM (%):     15%                                   Respiratory Indices by CMS criteria   Calculated pAHI (per hour): 2.7/h   ( AASM  8/h)                           Oxygen Saturation Statistics:        O2 Saturation Range (%):    88% through 98%                                    O2 Saturation (minutes) <89%:  0 minutes           IMPRESSION:  This HST did not find any evidence of sleep disordered breathing. NO APNEA is seen.    RECOMMENDATION: no need for intervention    INTERPRETING PHYSICIAN:   Melvyn Novas, MD

## 2023-04-18 ENCOUNTER — Other Ambulatory Visit: Payer: Self-pay | Admitting: Family Medicine

## 2023-04-18 DIAGNOSIS — I10 Essential (primary) hypertension: Secondary | ICD-10-CM

## 2023-04-30 NOTE — Telephone Encounter (Signed)
Prescription Request  04/30/2023  LOV: 03/29/2023  What is the name of the medication or equipment? amLODipine amLODipine (NORVASC) 5 MG tablet  *Pt states she is completely out of this Rx and cannot afford to miss any more days.  Please send refill asap.   Have you contacted your pharmacy to request a refill? Yes   Which pharmacy would you like this sent to?  [facility] [id number] - [place], [place] - [address] [place] [address] Phone: [phone] Fax: [phone]    Patient notified that their request is being sent to the clinical staff for review and that they should receive a response within 2 business days.   Please advise at Mobile [phone] (mobile)

## 2023-05-09 ENCOUNTER — Ambulatory Visit (INDEPENDENT_AMBULATORY_CARE_PROVIDER_SITE_OTHER): Payer: PPO | Admitting: Family Medicine

## 2023-05-09 ENCOUNTER — Encounter: Payer: Self-pay | Admitting: Family Medicine

## 2023-05-09 VITALS — BP 140/70 | HR 74 | Temp 97.9°F | Ht 64.75 in | Wt 170.4 lb

## 2023-05-09 DIAGNOSIS — M1711 Unilateral primary osteoarthritis, right knee: Secondary | ICD-10-CM | POA: Diagnosis not present

## 2023-05-09 DIAGNOSIS — Z780 Asymptomatic menopausal state: Secondary | ICD-10-CM | POA: Diagnosis not present

## 2023-05-09 DIAGNOSIS — E7800 Pure hypercholesterolemia, unspecified: Secondary | ICD-10-CM | POA: Diagnosis not present

## 2023-05-09 DIAGNOSIS — I10 Essential (primary) hypertension: Secondary | ICD-10-CM | POA: Diagnosis not present

## 2023-05-09 DIAGNOSIS — Z0000 Encounter for general adult medical examination without abnormal findings: Secondary | ICD-10-CM

## 2023-05-09 LAB — COMPREHENSIVE METABOLIC PANEL
ALT: 14 U/L (ref 0–35)
AST: 22 U/L (ref 0–37)
Albumin: 4.1 g/dL (ref 3.5–5.2)
Alkaline Phosphatase: 82 U/L (ref 39–117)
BUN: 12 mg/dL (ref 6–23)
CO2: 30 meq/L (ref 19–32)
Calcium: 9.4 mg/dL (ref 8.4–10.5)
Chloride: 100 meq/L (ref 96–112)
Creatinine, Ser: 0.88 mg/dL (ref 0.40–1.20)
GFR: 60.77 mL/min (ref >60.00)
Glucose, Bld: 102 mg/dL — ABNORMAL HIGH (ref 70–99)
Potassium: 4.6 meq/L (ref 3.5–5.1)
Sodium: 136 meq/L (ref 135–145)
Total Bilirubin: 0.5 mg/dL (ref 0.2–1.2)
Total Protein: 7 g/dL (ref 6.0–8.3)

## 2023-05-09 LAB — LIPID PANEL
Cholesterol: 128 mg/dL (ref 0–200)
HDL: 58.2 mg/dL (ref >39.00)
LDL Cholesterol: 50 mg/dL (ref 0–99)
NonHDL: 69.75
Total CHOL/HDL Ratio: 2
Triglycerides: 100 mg/dL (ref 0.0–149.0)
VLDL: 20 mg/dL (ref 0.0–40.0)

## 2023-05-09 MED ORDER — TRAMADOL HCL 50 MG PO TABS
50.0000 mg | ORAL_TABLET | Freq: Three times a day (TID) | ORAL | 1 refills | Status: DC | PRN
Start: 2023-05-09 — End: 2023-09-25

## 2023-05-09 MED ORDER — ATENOLOL 25 MG PO TABS
25.0000 mg | ORAL_TABLET | Freq: Every day | ORAL | 1 refills | Status: DC
Start: 2023-05-09 — End: 2023-11-07

## 2023-05-09 NOTE — Progress Notes (Signed)
Complete physical exam  Patient: [name]   DOB: [birth year]   82 y.o. Female  MRN: [id number]  Subjective:    Chief Complaint  Patient presents with   Annual Exam    [name] is a 82 y.o. female who presents today for a complete physical exam. She reports consuming a general diet.  Patient engages in a walking/stretching program for patients with fibromyalgia  She generally feels well. She reports sleeping fairly well. She  does not have additional problems to discuss today.   Most recent fall risk assessment:    05/09/2023    8:54 AM  Fall Risk   Falls in the past year? 1  Number falls in past yr: 1  Injury with Fall? 0  Risk for fall due to : Impaired balance/gait;Impaired mobility  Follow up Falls evaluation completed     Most recent depression screenings:    05/09/2023    8:55 AM 01/18/2023   11:09 AM  PHQ 2/9 Scores  PHQ - 2 Score 0 0  PHQ- 9 Score 0 2    Vision:Within last year and Dental: No current dental problems and Last dental visit: her old dentist retired, is planning on seeing a new dentist soon  Patient Active Problem List   Diagnosis Date Noted   DVT (deep venous thrombosis) (HCC) 04/26/2022   AKI (acute kidney injury) (HCC)    Pressure injury of skin 01/23/2021   OSA (obstructive sleep apnea) 08/23/2020   Dysphonia of essential tremor 06/21/2020   Essential tremor 06/21/2020   Chronic insomnia 06/21/2020   Status post total knee replacement, left 04/13/2017   Vitamin D deficiency 04/12/2017   History of chronic kidney disease 04/12/2017   Osteopenia of multiple sites 04/06/2017   Fibromyalgia syndrome 09/11/2016   Primary insomnia 09/11/2016   History of osteopenia 09/11/2016   Essential hypertension, benign 12/16/2013   Pure hypercholesterolemia 12/16/2013      Patient Care Team: Karie Georges, MD as PCP - General (Family Medicine) Corky Crafts, MD as PCP - Cardiology (Cardiology) Janalyn Harder, MD (Inactive) as  Consulting Physician (Dermatology) Glyn Ade, PA-C as Physician Assistant (Dermatology)   Outpatient Medications Prior to Visit  Medication Sig   albuterol (PROVENTIL) (2.5 MG/3ML) 0.083% nebulizer solution Take 3 mLs (2.5 mg total) by nebulization every 6 (six) hours as needed for wheezing or shortness of breath.   albuterol (VENTOLIN HFA) 108 (90 Base) MCG/ACT inhaler Inhale 2 puffs into the lungs every 6 (six) hours as needed for wheezing or shortness of breath.   amLODipine (NORVASC) 5 MG tablet TAKE 1 TABLET BY MOUTH DAILY   benzonatate (TESSALON) 100 MG capsule Take 1-2 capsules (100-200 mg total) by mouth 2 (two) times daily as needed for cough.   cetirizine (ZYRTEC) 10 MG tablet Take 10 mg by mouth daily.   cyclobenzaprine (FLEXERIL) 10 MG tablet TAKE ONE TABLET BY MOUTH THREE TIMES A DAY AS NEEDED FOR MUSCLE SPASMS   furosemide (LASIX) 20 MG tablet Take 1 tablet (20 mg total) by mouth every other day.   hydroxypropyl methylcellulose / hypromellose (ISOPTO TEARS / GONIOVISC) 2.5 % ophthalmic solution Place 1 drop into both eyes as needed for dry eyes.   Melatonin 10 MG TABS Take 20 mg by mouth at bedtime.   pregabalin (LYRICA) 75 MG capsule TAKE 1 CAPSULE BY MOUTH TWICE A DAY   primidone (MYSOLINE) 50 MG tablet Take 1 tablet (50 mg total) by mouth 2 (two) times daily.  simvastatin (ZOCOR) 40 MG tablet Take 1 tablet (40 mg total) by mouth at bedtime.   sodium chloride (OCEAN) 0.65 % SOLN nasal spray Place 1 spray into both nostrils as needed for congestion.   traZODone (DESYREL) 50 MG tablet TAKE ONE TABLET BY MOUTH EVERY NIGHT AT BEDTIME AS NEEDED FOR SLEEP   triamcinolone cream (KENALOG) 0.1 % Apply 1 Application topically 2 (two) times daily as needed (itching).   [DISCONTINUED] atenolol (TENORMIN) 25 MG tablet TAKE ONE TABLET BY MOUTH DAILY   [DISCONTINUED] atenolol (TENORMIN) 25 MG tablet Take 1 tablet (25 mg total) by mouth daily.   [DISCONTINUED] traMADol (ULTRAM) 50 MG  tablet Take 1 tablet (50 mg total) by mouth every 8 (eight) hours as needed.   Facility-Administered Medications Prior to Visit  Medication Dose Route Frequency Provider   ipratropium-albuterol (DUONEB) 0.5-2.5 (3) MG/3ML nebulizer solution 3 mL  3 mL Nebulization Once     Review of Systems  HENT:  Negative for hearing loss.   Eyes:  Negative for blurred vision.  Respiratory:  Negative for shortness of breath.   Cardiovascular:  Negative for chest pain.  Gastrointestinal: Negative.   Genitourinary: Negative.   Musculoskeletal:  Negative for back pain.  Neurological:  Negative for headaches.  Psychiatric/Behavioral:  Negative for depression.   All other systems reviewed and are negative.      Objective:     BP (!) 140/70 (BP Location: Right Arm, Patient Position: Sitting, Cuff Size: Normal)   Pulse 74   Temp 97.9 F (36.6 C) (Oral)   Ht 5' 4.75" (1.645 m)   Wt 170 lb 6.4 oz (77.3 kg)   SpO2 95%   BMI 28.58 kg/m    Physical Exam Vitals reviewed.  Constitutional:      Appearance: Normal appearance. She is well-groomed and normal weight.  HENT:     Right Ear: Tympanic membrane and ear canal normal.     Left Ear: Tympanic membrane and ear canal normal.     Mouth/Throat:     Mouth: Mucous membranes are moist.     Pharynx: No posterior oropharyngeal erythema.  Eyes:     Conjunctiva/sclera: Conjunctivae normal.  Neck:     Thyroid: No thyromegaly.  Cardiovascular:     Rate and Rhythm: Normal rate and regular rhythm.     Pulses: Normal pulses.     Heart sounds: S1 normal and S2 normal.  Pulmonary:     Effort: Pulmonary effort is normal.     Breath sounds: Normal breath sounds and air entry.  Abdominal:     General: Abdomen is flat. Bowel sounds are normal.     Palpations: Abdomen is soft.  Musculoskeletal:     Right lower leg: No edema.     Left lower leg: No edema.  Lymphadenopathy:     Cervical: No cervical adenopathy.  Neurological:     Mental Status: She is  alert and oriented to person, place, and time. Mental status is at baseline.     Gait: Gait is intact.  Psychiatric:        Mood and Affect: Mood and affect normal.        Speech: Speech normal.        Behavior: Behavior normal.        Judgment: Judgment normal.      No results found for any visits on 05/09/23.     Assessment & Plan:    Routine Health Maintenance and Physical Exam  Immunization History  Administered Date(s)  Administered   Influenza, High Dose Seasonal PF 04/16/2023   PFIZER Comirnaty(Gray Top)Covid-19 Tri-Sucrose Vaccine 04/07/2022   PFIZER(Purple Top)SARS-COV-2 Vaccination 07/28/2019, 08/20/2019    Health Maintenance  Topic Date Due   DTaP/Tdap/Td (1 - Tdap) Never done   Zoster Vaccines- Shingrix (1 of 2) Never done   COVID-19 Vaccine (4 - 2023-24 season) 05/25/2023 (Originally 03/11/2023)   Pneumonia Vaccine 79+ Years old (1 of 1 - PCV) 08/05/2023 (Originally 12/06/2004)   Medicare Annual Wellness (AWV)  08/05/2023   INFLUENZA VACCINE  Completed   DEXA SCAN  Completed   HPV VACCINES  Aged Out    Discussed health benefits of physical activity, and encouraged her to engage in regular exercise appropriate for her age and condition.  Routine general medical examination at a health care facility  Postmenopausal state -     DG Bone Density; Future  Primary osteoarthritis of right knee -     traMADol HCl; Take 1 tablet (50 mg total) by mouth every 8 (eight) hours as needed.  Dispense: 180 tablet; Refill: 1  Essential hypertension, benign -     Comprehensive metabolic panel -     Atenolol; Take 1 tablet (25 mg total) by mouth daily.  Dispense: 90 tablet; Refill: 1  Pure hypercholesterolemia -     Lipid panel  Normal physical exam findings today, she is due for her annual labs and DEXA scan. I reviewed her HM, medications and refilled what was needed. Will see her back in 6 months for follow up. BP is slightly up today, I advised that she monitor her BP at  home. Counseled patient on healthy sleep habits and handouts given on healthy eating and exercise.   Return in 6 months (on 11/07/2023).     Karie Georges, MD

## 2023-05-09 NOTE — Patient Instructions (Signed)

## 2023-06-13 ENCOUNTER — Other Ambulatory Visit: Payer: Self-pay | Admitting: Family Medicine

## 2023-06-13 DIAGNOSIS — F5104 Psychophysiologic insomnia: Secondary | ICD-10-CM

## 2023-06-19 ENCOUNTER — Ambulatory Visit (HOSPITAL_COMMUNITY)
Admission: EM | Admit: 2023-06-19 | Discharge: 2023-06-19 | Disposition: A | Discharge status: Home / Self Care | Payer: PPO | Attending: Family Medicine | Admitting: Family Medicine

## 2023-06-19 ENCOUNTER — Encounter (HOSPITAL_COMMUNITY): Payer: Self-pay

## 2023-06-19 ENCOUNTER — Telehealth: Payer: Self-pay | Admitting: Family Medicine

## 2023-06-19 DIAGNOSIS — S0010XA Contusion of unspecified eyelid and periocular area, initial encounter: Secondary | ICD-10-CM | POA: Diagnosis present

## 2023-06-19 LAB — PROTIME-INR
INR: 1 (ref 0.8–1.2)
Prothrombin Time: 13.8 s (ref 11.4–15.2)

## 2023-06-19 LAB — APTT: aPTT: 28 s (ref 24–36)

## 2023-06-19 LAB — CBC
HCT: 41 % (ref 36.0–46.0)
Hemoglobin: 13.8 g/dL (ref 12.0–15.0)
MCH: 32.9 pg (ref 26.0–34.0)
MCHC: 33.7 g/dL (ref 30.0–36.0)
MCV: 97.6 fL (ref 80.0–100.0)
Platelets: 278 10*3/uL (ref 150–400)
RBC: 4.2 MIL/uL (ref 3.87–5.11)
RDW: 13.1 % (ref 11.5–15.5)
WBC: 5.8 10*3/uL (ref 4.0–10.5)
nRBC: 0 % (ref 0.0–0.2)

## 2023-06-19 NOTE — Telephone Encounter (Signed)
Spoke with the patient to see if we could give recommendations for an urgent care close to her home.  Patient was advised PCP does not have any openings and she stated she is currently at an urgent care and hung up.

## 2023-06-19 NOTE — Discharge Instructions (Addendum)
We have drawn blood to check a CBC, INR, and PTT.  These will provide some measures of how well your blood can make clots.  There is anything significantly abnormal, our staff will call you  You can ice the bruised area if it is uncomfortable  Please follow-up with your primary care about this issue

## 2023-06-19 NOTE — Telephone Encounter (Signed)
Marylu Lund triage nurse is calling and the patient has bruises around eyes purple ish  like a raccoon and pt is not on blood thinners and Marylu Lund will ask pt to go to [facility] or ER. Marylu Lund wanted pt to be seen within 4 hrs no available

## 2023-06-19 NOTE — ED Provider Notes (Addendum)
[facility]    CSN: [id number] Arrival date & time: 06/19/23  1335      History   Chief Complaint Chief Complaint  Patient presents with   Eye Problem    HPI [name] is a 82 y.o. female.    Eye Problem Bruising on her right upper eyelid and around the lateral corner of her right eye.  There is no trauma recalled and she has not rubbed her eye.  This was first noticed yesterday.  No fever.  She did have some nasal drainage and sore throat in the last 3 to 4 days but that is already improved.  She did not really have much cough with that.  She is allergic to Darvocet, erythromycin, hydrochlorothiazide, lisinopril, and sulfa.  She is not on any blood thinners  Past Medical History:  Diagnosis Date   Anemia    BCC (basal cell carcinoma of skin) 03/27/2006   Above Left Upper Lip (MOH's)   Benign essential tremor    Chronic kidney disease    Essential hypertension, benign    Fibromyalgia    GERD (gastroesophageal reflux disease)    No longer a problem   Heart murmur    Hemorrhoids    Hyponatremia    secondary to hctz-recurrent March 2010   Low back pain    facet joint pain   Occipital neuralgia    Osteoarthritis    Osteopenia    Pneumonia    Pure hypercholesterolemia 12/16/2013   SCCA (squamous cell carcinoma) of skin 07/15/2002   Left Inner Cheek(in situ)   SCCA (squamous cell carcinoma) of skin 05/06/2004   Right Wing Nose(in situ) -Cx3,5FU   SCCA (squamous cell carcinoma) of skin 01/09/2011   Above Right Upper Lip(in situ)-Cx3   SCCA (squamous cell carcinoma) of skin 01/05/2014   Left Temple(in situ)-Cx3,5FU   SCCA (squamous cell carcinoma) of skin 09/30/2014   Right Wrist(in situ)-tx p bx   Shortness of breath dyspnea    Sleep apnea    Mild sleep apnea   Superficial basal cell carcinoma (BCC) 08/17/1982   Right Side Upper Lip   Tuberculosis    as a child    Patient Active Problem List   Diagnosis Date Noted   DVT (deep venous  thrombosis) (HCC) 04/26/2022   AKI (acute kidney injury) (HCC)    Pressure injury of skin 01/23/2021   OSA (obstructive sleep apnea) 08/23/2020   Dysphonia of essential tremor 06/21/2020   Essential tremor 06/21/2020   Chronic insomnia 06/21/2020   Status post total knee replacement, left 04/13/2017   Vitamin D deficiency 04/12/2017   History of chronic kidney disease 04/12/2017   Osteopenia of multiple sites 04/06/2017   Fibromyalgia syndrome 09/11/2016   Primary insomnia 09/11/2016   History of osteopenia 09/11/2016   Essential hypertension, benign 12/16/2013   Pure hypercholesterolemia 12/16/2013    Past Surgical History:  Procedure Laterality Date   TOTAL KNEE ARTHROPLASTY Left 11/08/2015   Procedure: LEFT TOTAL KNEE ARTHROPLASTY;  Surgeon: Ollen Gross, MD;  Location: [facility];  Service: Orthopedics;  Laterality: Left;   TOTAL KNEE ARTHROPLASTY Right 12/19/2021   Procedure: TOTAL KNEE ARTHROPLASTY;  Surgeon: Ollen Gross, MD;  Location: [facility];  Service: Orthopedics;  Laterality: Right;   TUBAL LIGATION      OB History   No obstetric history on file.      Home Medications    Prior to Admission medications   Medication Sig Start Date End Date Taking? Authorizing Provider  albuterol (PROVENTIL) (2.5 MG/3ML) 0.083% nebulizer solution Take 3 mLs (2.5 mg total) by nebulization every 6 (six) hours as needed for wheezing or shortness of breath. 03/29/23   Karie Georges, MD  albuterol (VENTOLIN HFA) 108 (90 Base) MCG/ACT inhaler Inhale 2 puffs into the lungs every 6 (six) hours as needed for wheezing or shortness of breath. 03/27/23   [place], Betty G, MD  amLODipine (NORVASC) 5 MG tablet TAKE 1 TABLET BY MOUTH DAILY 04/30/23   Karie Georges, MD  atenolol (TENORMIN) 25 MG tablet Take 1 tablet (25 mg total) by mouth daily. 05/09/23   Karie Georges, MD  cetirizine (ZYRTEC) 10 MG tablet Take 10 mg by mouth daily.    [provider]  cyclobenzaprine (FLEXERIL) 10  MG tablet TAKE ONE TABLET BY MOUTH THREE TIMES A DAY AS NEEDED FOR MUSCLE SPASMS 02/13/23   Karie Georges, MD  furosemide (LASIX) 20 MG tablet Take 1 tablet (20 mg total) by mouth every other day. 08/23/22   Gaston Islam., NP  hydroxypropyl methylcellulose / hypromellose (ISOPTO TEARS / GONIOVISC) 2.5 % ophthalmic solution Place 1 drop into both eyes as needed for dry eyes.    [provider]  pregabalin (LYRICA) 75 MG capsule TAKE 1 CAPSULE BY MOUTH TWICE A DAY 01/16/23   Karie Georges, MD  primidone (MYSOLINE) 50 MG tablet Take 1 tablet (50 mg total) by mouth 2 (two) times daily. 08/18/22   Karie Georges, MD  simvastatin (ZOCOR) 40 MG tablet Take 1 tablet (40 mg total) by mouth at bedtime. 08/26/15   Corky Crafts, MD  traMADol (ULTRAM) 50 MG tablet Take 1 tablet (50 mg total) by mouth every 8 (eight) hours as needed. 05/09/23   Karie Georges, MD  traZODone (DESYREL) 50 MG tablet TAKE 1 TABLET BY MOUTH EVERY NIGHT AT BEDTIME AS NEEDED FOR SLEEP 06/15/23   Karie Georges, MD  triamcinolone cream (KENALOG) 0.1 % Apply 1 Application topically 2 (two) times daily as needed (itching). 01/18/23   Karie Georges, MD    Family History Family History  Problem Relation Age of Onset   Heart disease Father    Prostate cancer Father    Heart disease Mother    Cancer - Ovarian Mother    Congestive Heart Failure Mother    Stroke Sister     Social History Social History   Tobacco Use   Smoking status: Never   Smokeless tobacco: Never  Vaping Use   Vaping status: Never Used  Substance Use Topics   Alcohol use: Yes    Alcohol/week: 7.0 standard drinks of alcohol    Types: 7 Standard drinks or equivalent per week    Comment: Cocktail daily   Drug use: No     Allergies   Propoxyphene, Erythromycin base, Hctz [hydrochlorothiazide], Lisinopril, and Sulfa antibiotics   Review of Systems Review of Systems   Physical Exam Triage Vital Signs ED Triage  Vitals [06/19/23 1423]  Encounter Vitals Group     BP 130/75     Systolic BP Percentile      Diastolic BP Percentile      Pulse Rate 64     Resp 16     Temp 97.8 F (36.6 C)     Temp Source Oral     SpO2 94 %     Weight 170 lb (77.1 kg)     Height 5\' 5"  (1.651 m)     Head Circumference  Peak Flow      Pain Score 1     Pain Loc      Pain Education      Exclude from Growth Chart    No data found.  Updated Vital Signs BP 130/75 (BP Location: Left Arm)   Pulse 64   Temp 97.8 F (36.6 C) (Oral)   Resp 16   Ht 5\' 5"  (1.651 m)   Wt 77.1 kg   SpO2 94%   BMI 28.29 kg/m   Visual Acuity Right Eye Distance: 20/70 Left Eye Distance: Unable to see chart Bilateral Distance: 20/70  Right Eye Near:   Left Eye Near:    Bilateral Near:     Physical Exam Vitals reviewed.  Constitutional:      General: She is not in acute distress.    Appearance: She is not ill-appearing, toxic-appearing or diaphoretic.  HENT:     Mouth/Throat:     Mouth: Mucous membranes are moist.     Pharynx: No oropharyngeal exudate or posterior oropharyngeal erythema.  Eyes:     Extraocular Movements: Extraocular movements intact.     Conjunctiva/sclera: Conjunctivae normal.     Pupils: Pupils are equal, round, and reactive to light.     Comments: Conjunctival are normal and not injected or red.  The right upper eyelid has some purple ecchymosis along the edge of the upper lid.  There is some yellow and green bruising further up on the eyelid and then she has some purple and yellow bruising over the skin next to the outer canthus and a little bit onto the lateral lower eyelid.  There is no fluctuance.  She also has a little bruising further up on her right frontal area, that she states is where she scratched  Cardiovascular:     Rate and Rhythm: Normal rate and regular rhythm.     Heart sounds: No murmur heard. Pulmonary:     Effort: Pulmonary effort is normal.     Breath sounds: Normal breath  sounds.  Musculoskeletal:     Cervical back: Neck supple.  Lymphadenopathy:     Cervical: No cervical adenopathy.  Skin:    Coloration: Skin is not jaundiced or pale.  Neurological:     General: No focal deficit present.     Mental Status: She is alert and oriented to person, place, and time.  Psychiatric:        Behavior: Behavior normal.      UC Treatments / Results  Labs (all labs ordered are listed, but only abnormal results are displayed) Labs Reviewed  CBC  PROTIME-INR  APTT    EKG   Radiology No results found.  Procedures Procedures (including critical care time)  Medications Ordered in UC Medications - No data to display  Initial Impression / Assessment and Plan / UC Course  I have reviewed the triage vital signs and the nursing notes.  Pertinent labs & imaging results that were available during my care of the patient were reviewed by me and considered in my medical decision making (see chart for details).     I discussed with the patient that I cannot tell what caused this.  CBC, pro time, and PTT are drawn today and we will notify her if anything is significantly abnormal.  I have asked her to follow-up with her primary care Final Clinical Impressions(s) / UC Diagnoses   Final diagnoses:  Ecchymosis of eyelid     Discharge Instructions      We have  drawn blood to check a CBC, INR, and PTT.  These will provide some measures of how well your blood can make clots.  There is anything significantly abnormal, our staff will call you  You can ice the bruised area if it is uncomfortable  Please follow-up with your primary care about this issue     ED Prescriptions   None    PDMP not reviewed this encounter.   Zenia Resides, MD 06/19/23 1453    Zenia Resides, MD 06/19/23 [phone]

## 2023-06-19 NOTE — ED Triage Notes (Signed)
Patient here today with c/o bruise on her right eye that she noticed yesterday evening. Patient states that she has recently gotten over a cold.

## 2023-07-18 DIAGNOSIS — H02054 Trichiasis without entropian left upper eyelid: Secondary | ICD-10-CM | POA: Diagnosis not present

## 2023-07-18 DIAGNOSIS — H02051 Trichiasis without entropian right upper eyelid: Secondary | ICD-10-CM | POA: Diagnosis not present

## 2023-07-18 DIAGNOSIS — H26492 Other secondary cataract, left eye: Secondary | ICD-10-CM | POA: Diagnosis not present

## 2023-07-18 DIAGNOSIS — H2511 Age-related nuclear cataract, right eye: Secondary | ICD-10-CM | POA: Diagnosis not present

## 2023-07-20 ENCOUNTER — Other Ambulatory Visit: Payer: Self-pay | Admitting: Family Medicine

## 2023-07-20 DIAGNOSIS — M797 Fibromyalgia: Secondary | ICD-10-CM

## 2023-08-05 ENCOUNTER — Other Ambulatory Visit: Payer: Self-pay | Admitting: Family Medicine

## 2023-08-05 DIAGNOSIS — M797 Fibromyalgia: Secondary | ICD-10-CM

## 2023-08-08 DIAGNOSIS — H26492 Other secondary cataract, left eye: Secondary | ICD-10-CM | POA: Diagnosis not present

## 2023-08-10 ENCOUNTER — Other Ambulatory Visit: Payer: Self-pay | Admitting: Family Medicine

## 2023-08-10 DIAGNOSIS — G250 Essential tremor: Secondary | ICD-10-CM

## 2023-08-13 ENCOUNTER — Ambulatory Visit: Payer: PPO

## 2023-08-13 VITALS — Ht 64.5 in | Wt 170.0 lb

## 2023-08-13 DIAGNOSIS — Z0000 Encounter for general adult medical examination without abnormal findings: Secondary | ICD-10-CM

## 2023-08-13 NOTE — Patient Instructions (Addendum)
[name] , Thank you for taking time to come for your Medicare Wellness Visit. I appreciate your ongoing commitment to your health goals. Please review the following plan we discussed and let me know if I can assist you in the future.   Referrals/Orders/Follow-Ups/Clinician Recommendations:   This is a list of the screening recommended for you and due dates:  Health Maintenance  Topic Date Due   DTaP/Tdap/Td vaccine (1 - Tdap) Never done   Zoster (Shingles) Vaccine (1 of 2) Never done   Pneumonia Vaccine (1 of 1 - PCV) Never done   COVID-19 Vaccine (5 - 2024-25 season) 07/10/2023   Medicare Annual Wellness Visit  08/12/2024   Flu Shot  Completed   DEXA scan (bone density measurement)  Completed   HPV Vaccine  Aged Out   Opioid Pain Medicine Management Opioids are powerful medicines that are used to treat moderate to severe pain. When used for short periods of time, they can help you to: Sleep better. Do better in physical or occupational therapy. Feel better in the first few days after an injury. Recover from surgery. Opioids should be taken with the supervision of a trained health care provider. They should be taken for the shortest period of time possible. This is because opioids can be addictive, and the longer you take opioids, the greater your risk of addiction. This addiction can also be called opioid use disorder. What are the risks? Using opioid pain medicines for longer than 3 days increases your risk of side effects. Side effects include: Constipation. Nausea and vomiting. Breathing difficulties (respiratory depression). Drowsiness. Confusion. Opioid use disorder. Itching. Taking opioid pain medicine for a long period of time can affect your ability to do daily tasks. It also puts you at risk for: Motor vehicle crashes. Depression. Suicide. Heart attack. Overdose, which can be life-threatening. What is a pain treatment plan? A pain treatment plan is an agreement  between you and your health care provider. Pain is unique to each person, and treatments vary depending on your condition. To manage your pain, you and your health care provider need to work together. To help you do this: Discuss the goals of your treatment, including how much pain you might expect to have and how you will manage the pain. Review the risks and benefits of taking opioid medicines. Remember that a good treatment plan uses more than one approach and minimizes the chance of side effects. Be honest about the amount of medicines you take and about any drug or alcohol use. Get pain medicine prescriptions from only one health care provider. Pain can be managed with many types of alternative treatments. Ask your health care provider to refer you to one or more specialists who can help you manage pain through: Physical or occupational therapy. Counseling (cognitive behavioral therapy). Good nutrition. Biofeedback. Massage. Meditation. Non-opioid medicine. Following a gentle exercise program. How to use opioid pain medicine Taking medicine Take your pain medicine exactly as told by your health care provider. Take it only when you need it. If your pain gets less severe, you may take less than your prescribed dose if your health care provider approves. If you are not having pain, do nottake pain medicine unless your health care provider tells you to take it. If your pain is severe, do nottry to treat it yourself by taking more pills than instructed on your prescription. Contact your health care provider for help. Write down the times when you take your pain medicine. It is  easy to become confused while on pain medicine. Writing the time can help you avoid overdose. Take other over-the-counter or prescription medicines only as told by your health care provider. Keeping yourself and others safe  While you are taking opioid pain medicine: Do not drive, use machinery, or power tools. Do  not sign legal documents. Do not drink alcohol. Do not take sleeping pills. Do not supervise children by yourself. Do not do activities that require climbing or being in high places. Do not go to a lake, river, ocean, spa, or swimming pool. Do not share your pain medicine with anyone. Keep pain medicine in a locked cabinet or in a secure area where pets and children cannot reach it. Stopping your use of opioids If you have been taking opioid medicine for more than a few weeks, you may need to slowly decrease (taper) how much you take until you stop completely. Tapering your use of opioids can decrease your risk of symptoms of withdrawal, such as: Pain and cramping in the abdomen. Nausea. Sweating. Sleepiness. Restlessness. Uncontrollable shaking (tremors). Cravings for the medicine. Do not attempt to taper your use of opioids on your own. Talk with your health care provider about how to do this. Your health care provider may prescribe a step-down schedule based on how much medicine you are taking and how long you have been taking it. Getting rid of leftover pills Do not save any leftover pills. Get rid of leftover pills safely by: Taking the medicine to a prescription take-back program. This is usually offered by the county or law enforcement. Bringing them to a pharmacy that has a drug disposal container. Flushing them down the toilet. Check the label or package insert of your medicine to see whether this is safe to do. Throwing them out in the trash. Check the label or package insert of your medicine to see whether this is safe to do. If it is safe to throw it out, remove the medicine from the original container, put it into a sealable bag or container, and mix it with used coffee grounds, food scraps, dirt, or cat litter before putting it in the trash. Follow these instructions at home: Activity Do exercises as told by your health care provider. Avoid activities that make your pain  worse. Return to your normal activities as told by your health care provider. Ask your health care provider what activities are safe for you. General instructions You may need to take these actions to prevent or treat constipation: Drink enough fluid to keep your urine pale yellow. Take over-the-counter or prescription medicines. Eat foods that are high in fiber, such as beans, whole grains, and fresh fruits and vegetables. Limit foods that are high in fat and processed sugars, such as fried or sweet foods. Keep all follow-up visits. This is important. Where to find support If you have been taking opioids for a long time, you may benefit from receiving support for quitting from a local support group or counselor. Ask your health care provider for a referral to these resources in your area. Where to find more information Centers for Disease Control and Prevention (CDC): FootballExhibition.com.br [place] Food and Drug Administration (FDA): PumpkinSearch.com.ee Get help right away if: You may have taken too much of an opioid (overdosed). Common symptoms of an overdose: Your breathing is slower or more shallow than normal. You have a very slow heartbeat (pulse). You have slurred speech. You have nausea and vomiting. Your pupils become very small. You have other  potential symptoms: You are very confused. You faint or feel like you will faint. You have cold, clammy skin. You have blue lips or fingernails. You have thoughts of harming yourself or harming others. These symptoms may represent a serious problem that is an emergency. Do not wait to see if the symptoms will go away. Get medical help right away. Call your local emergency services (911 in the [place] Do not drive yourself to the hospital.  If you ever feel like you may hurt yourself or others, or have thoughts about taking your own life, get help right away. Go to your nearest emergency department or: Call your local emergency services (911 in the [place] Call  the [facility] (434-399-6516 in the [place] Call a suicide crisis helpline, such as the National Suicide Prevention Lifeline at (458)368-7995 or 988 in the [place] This is open 24 hours a day in the [place] If you're a Veteran: Call 988 and press 1. This is open 24 hours a day. Text the [employer] at 908 754 4104. Summary Opioid medicines can help you manage moderate to severe pain for a short period of time. A pain treatment plan is an agreement between you and your health care provider. Discuss the goals of your treatment, including how much pain you might expect to have and how you will manage the pain. If you think that you or someone else may have taken too much of an opioid, get medical help right away. This information is not intended to replace advice given to you by your health care provider. Make sure you discuss any questions you have with your health care provider. Document Revised: 04/02/2023 Document Reviewed: 10/06/2020 Elsevier Patient Education  2024 Elsevier Inc. Advanced directives: (Copy Requested) Please bring a copy of your health care power of attorney and living will to the office to be added to your chart at your convenience.  Next Medicare Annual Wellness Visit scheduled for next year: Yes

## 2023-08-13 NOTE — Progress Notes (Cosign Needed)
Subjective:   [name] is a 83 y.o. female who presents for Medicare Annual (Subsequent) preventive examination.  Visit Complete: Virtual I connected with  Bertram Denver on 08/13/23 by a audio enabled telemedicine application and verified that I am speaking with the correct person using two identifiers.  Patient Location: Home  Provider Location: Home Office  I discussed the limitations of evaluation and management by telemedicine. The patient expressed understanding and agreed to proceed.  Vital Signs: Because this visit was a virtual/telehealth visit, some criteria may be missing or patient reported. Any vitals not documented were not able to be obtained and vitals that have been documented are patient reported.    Cardiac Risk Factors include: advanced age (>61men, >51 women);hypertension     Objective:    Today's Vitals   08/13/23 1512  Weight: 170 lb (77.1 kg)  Height: 5' 4.5" (1.638 m)   Body mass index is 28.73 kg/m.     08/13/2023    3:18 PM 08/04/2022    1:24 PM 12/19/2021   11:39 AM 12/13/2021   11:10 AM 01/22/2021    4:00 PM 01/06/2021    6:03 PM 11/08/2015    5:10 PM  Advanced Directives  Does Patient Have a Medical Advance Directive? Yes Yes Yes Yes Yes Yes Yes  Type of Estate agent of [place];Living will Healthcare Power of [place];Living will Living will;Healthcare Power of [employer] Power of [place];Living will Healthcare Power of [place];Living will Healthcare Power of [place];Living will Healthcare Power of [place];Living will  Does patient want to make changes to medical advance directive?   No - Patient declined    No - Patient declined  Copy of Healthcare Power of Attorney in Chart? No - copy requested No - copy requested No - copy requested No - copy requested   No - copy requested    Current Medications (verified) Outpatient Encounter Medications as of 08/13/2023  Medication Sig   albuterol (PROVENTIL) (2.5 MG/3ML)  0.083% nebulizer solution Take 3 mLs (2.5 mg total) by nebulization every 6 (six) hours as needed for wheezing or shortness of breath.   albuterol (VENTOLIN HFA) 108 (90 Base) MCG/ACT inhaler Inhale 2 puffs into the lungs every 6 (six) hours as needed for wheezing or shortness of breath.   amLODipine (NORVASC) 5 MG tablet TAKE 1 TABLET BY MOUTH DAILY   atenolol (TENORMIN) 25 MG tablet Take 1 tablet (25 mg total) by mouth daily.   cetirizine (ZYRTEC) 10 MG tablet Take 10 mg by mouth daily.   cyclobenzaprine (FLEXERIL) 10 MG tablet TAKE ONE TABLET BY MOUTH THREE TIMES A DAY AS NEEDED FOR MUSCLE SPASMS   furosemide (LASIX) 20 MG tablet Take 1 tablet (20 mg total) by mouth every other day.   hydroxypropyl methylcellulose / hypromellose (ISOPTO TEARS / GONIOVISC) 2.5 % ophthalmic solution Place 1 drop into both eyes as needed for dry eyes.   pregabalin (LYRICA) 75 MG capsule TAKE 1 CAPSULE BY MOUTH 2 TIMES A DAY   primidone (MYSOLINE) 50 MG tablet TAKE 1 TABLET BY MOUTH TWICE A DAY   simvastatin (ZOCOR) 40 MG tablet Take 1 tablet (40 mg total) by mouth at bedtime.   traMADol (ULTRAM) 50 MG tablet Take 1 tablet (50 mg total) by mouth every 8 (eight) hours as needed.   traZODone (DESYREL) 50 MG tablet TAKE 1 TABLET BY MOUTH EVERY NIGHT AT BEDTIME AS NEEDED FOR SLEEP   triamcinolone cream (KENALOG) 0.1 % Apply 1 Application topically 2 (  two) times daily as needed (itching).   Facility-Administered Encounter Medications as of 08/13/2023  Medication   ipratropium-albuterol (DUONEB) 0.5-2.5 (3) MG/3ML nebulizer solution 3 mL    Allergies (verified) Propoxyphene, Erythromycin base, Hctz [hydrochlorothiazide], Lisinopril, and Sulfa antibiotics   History: Past Medical History:  Diagnosis Date   Anemia    BCC (basal cell carcinoma of skin) 03/27/2006   Above Left Upper Lip (MOH's)   Benign essential tremor    Chronic kidney disease    Essential hypertension, benign    Fibromyalgia    GERD  (gastroesophageal reflux disease)    No longer a problem   Heart murmur    Hemorrhoids    Hyponatremia    secondary to hctz-recurrent March 2010   Low back pain    facet joint pain   Occipital neuralgia    Osteoarthritis    Osteopenia    Pneumonia    Pure hypercholesterolemia 12/16/2013   SCCA (squamous cell carcinoma) of skin 07/15/2002   Left Inner Cheek(in situ)   SCCA (squamous cell carcinoma) of skin 05/06/2004   Right Wing Nose(in situ) -Cx3,5FU   SCCA (squamous cell carcinoma) of skin 01/09/2011   Above Right Upper Lip(in situ)-Cx3   SCCA (squamous cell carcinoma) of skin 01/05/2014   Left Temple(in situ)-Cx3,5FU   SCCA (squamous cell carcinoma) of skin 09/30/2014   Right Wrist(in situ)-tx p bx   Shortness of breath dyspnea    Sleep apnea    Mild sleep apnea   Superficial basal cell carcinoma (BCC) 08/17/1982   Right Side Upper Lip   Tuberculosis    as a child   Past Surgical History:  Procedure Laterality Date   TOTAL KNEE ARTHROPLASTY Left 11/08/2015   Procedure: LEFT TOTAL KNEE ARTHROPLASTY;  Surgeon: Ollen Gross, MD;  Location: [facility];  Service: Orthopedics;  Laterality: Left;   TOTAL KNEE ARTHROPLASTY Right 12/19/2021   Procedure: TOTAL KNEE ARTHROPLASTY;  Surgeon: Ollen Gross, MD;  Location: [facility];  Service: Orthopedics;  Laterality: Right;   TUBAL LIGATION     Family History  Problem Relation Age of Onset   Heart disease Father    Prostate cancer Father    Heart disease Mother    Cancer - Ovarian Mother    Congestive Heart Failure Mother    Stroke Sister    Social History   Socioeconomic History   Marital status: Divorced    Spouse name: Not on file   Number of children: 2   Years of education: Masters   Highest education level: Master's degree (e.g., MA, MS, MEng, MEd, MSW, MBA)  Occupational History   Occupation: Retired  Tobacco Use   Smoking status: Never   Smokeless tobacco: Never  Vaping Use   Vaping status: Never Used  Substance  and Sexual Activity   Alcohol use: Yes    Alcohol/week: 7.0 standard drinks of alcohol    Types: 7 Standard drinks or equivalent per week    Comment: Cocktail daily   Drug use: No   Sexual activity: Not on file  Other Topics Concern   Not on file  Social History Narrative   Denies caffeine use    Social Drivers of Health   Financial Resource Strain: Low Risk  (08/13/2023)   Overall Financial Resource Strain (CARDIA)    Difficulty of Paying Living Expenses: Not hard at all  Food Insecurity: No Food Insecurity (08/13/2023)   Hunger Vital Sign    Worried About Running Out of Food in the Last Year: Never true  Ran Out of Food in the Last Year: Never true  Transportation Needs: No Transportation Needs (08/13/2023)   PRAPARE - Administrator, Civil Service (Medical): No    Lack of Transportation (Non-Medical): No  Physical Activity: Insufficiently Active (08/13/2023)   Exercise Vital Sign    Days of Exercise per Week: 1 day    Minutes of Exercise per Session: 90 min  Stress: No Stress Concern Present (08/13/2023)   [employer] of Occupational Health - Occupational Stress Questionnaire    Feeling of Stress : Not at all  Social Connections: Moderately Integrated (08/13/2023)   Social Connection and Isolation Panel [NHANES]    Frequency of Communication with Friends and Family: More than three times a week    Frequency of Social Gatherings with Friends and Family: More than three times a week    Attends Religious Services: More than 4 times per year    Active Member of [employer] or Organizations: Yes    Attends Engineer, structural: More than 4 times per year    Marital Status: Divorced    Tobacco Counseling Counseling given: Not Answered   Clinical Intake:  Pre-visit preparation completed: Yes  Pain : No/denies pain     BMI - recorded: 28.73 Nutritional Status: BMI 25 -29 Overweight Nutritional Risks: None Diabetes: No  How often do you need to have  someone help you when you read instructions, pamphlets, or other written materials from your doctor or pharmacy?: 1 - Never  Interpreter Needed?: No  Information entered by :: Theresa Mulligan LPN   Activities of Daily Living    08/13/2023    3:17 PM  In your present state of health, do you have any difficulty performing the following activities:  Hearing? 0  Vision? 0  Difficulty concentrating or making decisions? 0  Walking or climbing stairs? 0  Dressing or bathing? 0  Doing errands, shopping? 0  Preparing Food and eating ? N  Using the Toilet? N  In the past six months, have you accidently leaked urine? Y  Comment Wears a pad. Followed by PCP  Do you have problems with loss of bowel control? N  Managing your Medications? N  Managing your Finances? N  Housekeeping or managing your Housekeeping? N    Patient Care Team: Karie Georges, MD as PCP - General (Family Medicine) Corky Crafts, MD as PCP - Cardiology (Cardiology) Janalyn Harder, MD (Inactive) as Consulting Physician (Dermatology) Glyn Ade, PA-C as Physician Assistant (Dermatology)  Indicate any recent Medical Services you may have received from other than [facility] providers in the past year (date may be approximate).     Assessment:   This is a routine wellness examination for [name].  Hearing/Vision screen Hearing Screening - Comments:: Denies hearing difficulties   Vision Screening - Comments:: Wears rx glasses - up to date with routine eye exams with  Dr Charlotte Sanes   Goals Addressed               This Visit's Progress     Remain Active (pt-stated)         Depression Screen    08/13/2023    3:16 PM 05/09/2023    8:55 AM 01/18/2023   11:09 AM 08/04/2022    1:23 PM  PHQ 2/9 Scores  PHQ - 2 Score 0 0 0 0  PHQ- 9 Score  0 2     Fall Risk    08/13/2023    3:17 PM 05/09/2023  8:54 AM 01/18/2023   11:08 AM 08/14/2022    9:18 AM 08/04/2022    1:24 PM  Fall Risk   Falls in the past year?  1 1 1  0 0  Number falls in past yr: 0 1 0  0  Injury with Fall? 0 0 0  0  Risk for fall due to : No Fall Risks Impaired balance/gait;Impaired mobility Impaired balance/gait  No Fall Risks  Follow up Falls prevention discussed Falls evaluation completed Falls evaluation completed  Falls prevention discussed    MEDICARE RISK AT HOME: Medicare Risk at Home Any stairs in or around the home?: No If so, are there any without handrails?: No Home free of loose throw rugs in walkways, pet beds, electrical cords, etc?: Yes Adequate lighting in your home to reduce risk of falls?: Yes Life alert?: No Use of a cane, walker or w/c?: No Grab bars in the bathroom?: Yes Shower chair or bench in shower?: No Elevated toilet seat or a handicapped toilet?: No  TIMED UP AND GO:  Was the test performed?  No    Cognitive Function:        08/13/2023    3:18 PM 08/04/2022    1:25 PM  6CIT Screen  What Year? 0 points 0 points  What month? 0 points 0 points  What time? 0 points 0 points  Count back from 20 0 points 0 points  Months in reverse 0 points 0 points  Repeat phrase 0 points 0 points  Total Score 0 points 0 points    Immunizations Immunization History  Administered Date(s) Administered   Influenza, High Dose Seasonal PF 04/16/2023   PFIZER Comirnaty(Gray Top)Covid-19 Tri-Sucrose Vaccine 04/07/2022, 05/15/2023   PFIZER(Purple Top)SARS-COV-2 Vaccination 07/28/2019, 08/20/2019    TDAP status: Due, Education has been provided regarding the importance of this vaccine. Advised may receive this vaccine at local pharmacy or Health Dept. Aware to provide a copy of the vaccination record if obtained from local pharmacy or Health Dept. Verbalized acceptance and understanding.  Flu Vaccine status: Up to date  Pneumococcal vaccine status: Due, Education has been provided regarding the importance of this vaccine. Advised may receive this vaccine at local pharmacy or Health Dept. Aware to provide a  copy of the vaccination record if obtained from local pharmacy or Health Dept. Verbalized acceptance and understanding.  Covid-19 vaccine status: Declined, Education has been provided regarding the importance of this vaccine but patient still declined. Advised may receive this vaccine at local pharmacy or Health Dept.or vaccine clinic. Aware to provide a copy of the vaccination record if obtained from local pharmacy or Health Dept. Verbalized acceptance and understanding.  Qualifies for Shingles Vaccine? Yes   Zostavax completed No   Shingrix Completed?: No.    Education has been provided regarding the importance of this vaccine. Patient has been advised to call insurance company to determine out of pocket expense if they have not yet received this vaccine. Advised may also receive vaccine at local pharmacy or Health Dept. Verbalized acceptance and understanding.  Screening Tests Health Maintenance  Topic Date Due   DTaP/Tdap/Td (1 - Tdap) Never done   Zoster Vaccines- Shingrix (1 of 2) Never done   Pneumonia Vaccine 5+ Years old (1 of 1 - PCV) Never done   COVID-19 Vaccine (5 - 2024-25 season) 07/10/2023   Medicare Annual Wellness (AWV)  08/12/2024   INFLUENZA VACCINE  Completed   DEXA SCAN  Completed   HPV VACCINES  Aged Out  Health Maintenance  Health Maintenance Due  Topic Date Due   DTaP/Tdap/Td (1 - Tdap) Never done   Zoster Vaccines- Shingrix (1 of 2) Never done   Pneumonia Vaccine 9+ Years old (1 of 1 - PCV) Never done   COVID-19 Vaccine (5 - 2024-25 season) 07/10/2023        Bone Density status: Ordered Scheduled for 12/19/23. Pt provided with contact info and advised to call to schedule appt.     Additional Screening:    Vision Screening: Recommended annual ophthalmology exams for early detection of glaucoma and other disorders of the eye. Is the patient up to date with their annual eye exam?  Yes  Who is the provider or what is the name of the office in  which the patient attends annual eye exams? Dr Charlotte Sanes If pt is not established with a provider, would they like to be referred to a provider to establish care? No .   Dental Screening: Recommended annual dental exams for proper oral hygiene   Community Resource Referral / Chronic Care Management:  CRR required this visit?  No   CCM required this visit?  No     Plan:     I have personally reviewed and noted the following in the patient's chart:   Medical and social history Use of alcohol, tobacco or illicit drugs  Current medications and supplements including opioid prescriptions. Patient currently taking Opioids Functional ability and status Nutritional status Physical activity Advanced directives List of other physicians Hospitalizations, surgeries, and ER visits in previous 12 months Vitals Screenings to include cognitive, depression, and falls Referrals and appointments  In addition, I have reviewed and discussed with patient certain preventive protocols, quality metrics, and best practice recommendations. A written personalized care plan for preventive services as well as general preventive health recommendations were provided to patient.     Tillie Rung, LPN   08/19/5619   After Visit Summary: (MyChart) Due to this being a telephonic visit, the after visit summary with patients personalized plan was offered to patient via MyChart   Nurse Notes: None

## 2023-09-18 ENCOUNTER — Telehealth: Payer: Self-pay | Admitting: Family Medicine

## 2023-09-18 DIAGNOSIS — M797 Fibromyalgia: Secondary | ICD-10-CM

## 2023-09-18 MED ORDER — TIZANIDINE HCL 4 MG PO TABS: 4.0000 mg | ORAL_TABLET | Freq: Three times a day (TID) | ORAL | 2 refills | Status: DC | PRN

## 2023-09-18 NOTE — Telephone Encounter (Signed)
 Patient informed of the message below.

## 2023-09-18 NOTE — Telephone Encounter (Signed)
 Sent in in tizanidine for her

## 2023-09-18 NOTE — Telephone Encounter (Signed)
 Copied from CRM [id number]. Topic: Clinical - Medication Question >> Sep 18, 2023 11:26 AM Denese Killings wrote: Reason for CRM: Patient insurance doesn't cover cyclobenzaprine (FLEXERIL) 10 MG tablet. She wants to know if there is an alternative she can take save money.

## 2023-09-22 ENCOUNTER — Other Ambulatory Visit: Payer: Self-pay | Admitting: Family Medicine

## 2023-09-22 DIAGNOSIS — M1711 Unilateral primary osteoarthritis, right knee: Secondary | ICD-10-CM

## 2023-10-24 ENCOUNTER — Other Ambulatory Visit: Payer: Self-pay | Admitting: Family Medicine

## 2023-10-24 DIAGNOSIS — I10 Essential (primary) hypertension: Secondary | ICD-10-CM

## 2023-11-05 ENCOUNTER — Other Ambulatory Visit: Payer: Self-pay | Admitting: Family Medicine

## 2023-11-05 DIAGNOSIS — M797 Fibromyalgia: Secondary | ICD-10-CM

## 2023-11-07 ENCOUNTER — Ambulatory Visit (INDEPENDENT_AMBULATORY_CARE_PROVIDER_SITE_OTHER): Payer: PPO | Admitting: Family Medicine

## 2023-11-07 ENCOUNTER — Encounter: Payer: Self-pay | Admitting: Family Medicine

## 2023-11-07 VITALS — BP 140/62 | HR 85 | Temp 97.9°F | Ht 64.5 in | Wt 164.0 lb

## 2023-11-07 DIAGNOSIS — M797 Fibromyalgia: Secondary | ICD-10-CM | POA: Diagnosis not present

## 2023-11-07 DIAGNOSIS — I10 Essential (primary) hypertension: Secondary | ICD-10-CM | POA: Diagnosis not present

## 2023-11-07 DIAGNOSIS — N393 Stress incontinence (female) (male): Secondary | ICD-10-CM

## 2023-11-07 DIAGNOSIS — B351 Tinea unguium: Secondary | ICD-10-CM

## 2023-11-07 MED ORDER — PREGABALIN 75 MG PO CAPS: 75.0000 mg | ORAL_CAPSULE | Freq: Two times a day (BID) | ORAL | 1 refills | Status: DC

## 2023-11-07 MED ORDER — JUBLIA 10 % EX SOLN: CUTANEOUS | 11 refills | Status: DC

## 2023-11-07 MED ORDER — JUBLIA 10 % EX SOLN: 1.0000 | Freq: Every day | CUTANEOUS | 11 refills | Status: DC

## 2023-11-07 MED ORDER — CYCLOBENZAPRINE HCL 10 MG PO TABS: 10.0000 mg | ORAL_TABLET | Freq: Every day | ORAL | 1 refills | Status: DC

## 2023-11-07 MED ORDER — ATENOLOL 25 MG PO TABS: 25.0000 mg | ORAL_TABLET | Freq: Every day | ORAL | 1 refills | Status: DC

## 2023-11-07 NOTE — Assessment & Plan Note (Signed)
 Patient's ain sx are stable on the lyrica  and tramadol  RX, see below. Will change her bak to the cyclobenzaprine  and I gave her the coupon for the medication.

## 2023-11-07 NOTE — Progress Notes (Signed)
 Established Patient Office Visit  Subjective   Patient ID: [name], female    DOB: 09-Jul-1941  Age: 83 y.o. MRN: [id number]  Chief Complaint  Patient presents with   Medical Management of Chronic Issues    Pt is here for follow up on fibromyalgia and HTN.   Fibromyalgia-- pt reports she has mostly good days with her chronic pain. States that about 1-2 days per month are worse than the other days. She reports that the tramadol  and the lyrica  are doing a good job controlling her pain. No side effects reported from the medication. States that the tizanidine  is not doing as good a job as the cyclobenzaprine , she would like to switch back to the cyclobenzaprine  10 mg once daily.   Pt is reporting that the fungal infection in her toenaills on the right foot has continued. She reports it is most of her toes on the right foot.   Stress incontinence-- pt states she continues to have bladder incontinence and is interested in going to pelvic floor therapy.   HTN -- BP in office performed and is well controlled. She  reports no side effects to the medications, no chest pain, SOB, dizziness or headaches. She has a BP cuff at home and is checking BP regularly, reports they are in the normal range.        Current Outpatient Medications  Medication Instructions   albuterol  (PROVENTIL ) 2.5 mg, Nebulization, Every 6 hours PRN   albuterol  (VENTOLIN  HFA) 108 (90 Base) MCG/ACT inhaler 2 puffs, Inhalation, Every 6 hours PRN   amLODipine  (NORVASC ) 5 mg, Oral, Daily   atenolol  (TENORMIN ) 25 mg, Oral, Daily   cetirizine (ZYRTEC) 10 mg, Daily   cyclobenzaprine  (FLEXERIL ) 10 mg, Oral, Daily at bedtime   Efinaconazole (JUBLIA) 10 % SOLN Apply 2 drops to the big toe and 1 drop to the other 4 toes daily.   furosemide  (LASIX ) 20 mg, Oral, Every other day   hydroxypropyl methylcellulose / hypromellose (ISOPTO TEARS / GONIOVISC) 2.5 % ophthalmic solution 1 drop, As needed   pregabalin  (LYRICA ) 75 mg, Oral,  2 times daily   primidone  (MYSOLINE ) 50 mg, Oral, 2 times daily   simvastatin  (ZOCOR ) 40 mg, Oral, Daily at bedtime   traMADol  (ULTRAM ) 50 mg, Oral, Every 8 hours PRN   traZODone  (DESYREL ) 50 MG tablet TAKE 1 TABLET BY MOUTH EVERY NIGHT AT BEDTIME AS NEEDED FOR SLEEP   triamcinolone  cream (KENALOG ) 0.1 % 1 Application, Topical, 2 times daily PRN      Review of Systems  All other systems reviewed and are negative.     Objective:     BP (!) 140/62   Pulse 85   Temp 97.9 F (36.6 C) (Oral)   Ht 5' 4.5" (1.638 m)   Wt 164 lb (74.4 kg)   SpO2 96%   BMI 27.72 kg/m    Physical Exam Vitals reviewed.  Constitutional:      Appearance: Normal appearance. She is well-groomed and normal weight.  Neck:     Thyroid: No thyromegaly.  Cardiovascular:     Rate and Rhythm: Normal rate and regular rhythm.     Pulses: Normal pulses.     Heart sounds: S1 normal and S2 normal.  Pulmonary:     Effort: Pulmonary effort is normal.     Breath sounds: Normal breath sounds and air entry.  Musculoskeletal:     Right lower leg: Edema (1+ BL ankles) present.     Left lower leg:  Edema present.  Neurological:     Mental Status: She is alert and oriented to person, place, and time. Mental status is at baseline.     Gait: Gait is intact.  Psychiatric:        Mood and Affect: Mood and affect normal.        Speech: Speech normal.        Behavior: Behavior normal.      No results found for any visits on 11/07/23.    The ASCVD Risk score (Arnett DK, et al., 2019) failed to calculate for the following reasons:   The 2019 ASCVD risk score is only valid for ages 67 to 19    Assessment & Plan:  Onychomycosis of great toe Pt reports the jublia worked well for her in the past, will call this in for her.   [place] Leaf; Apply 2 drops to the big toe and 1 drop to the other 4 toes daily.  Dispense: 4 mL; Refill: 11  Fibromyalgia syndrome Assessment & Plan: Patient's ain sx are stable on the  lyrica  and tramadol  RX, see below. Will change her bak to the cyclobenzaprine  and I gave her the coupon for the medication.   Orders: -     Pregabalin ; Take 1 capsule (75 mg total) by mouth 2 (two) times daily.  Dispense: 180 capsule; Refill: 1 -     Cyclobenzaprine  HCl; Take 1 tablet (10 mg total) by mouth at bedtime.  Dispense: 90 tablet; Refill: 1  Essential hypertension, benign Assessment & Plan: BP slightly high today but this is likely due to pain, will continue atenolol  25 mg daily  Orders: -     Atenolol ; Take 1 tablet (25 mg total) by mouth daily.  Dispense: 90 tablet; Refill: 1  Stress incontinence of urine -     Ambulatory referral to Physical Therapy     Return in about 6 months (around 05/08/2024) for annual physical exam.    Aida House, MD

## 2023-11-07 NOTE — Assessment & Plan Note (Signed)
 BP slightly high today but this is likely due to pain, will continue atenolol  25 mg daily

## 2023-11-08 ENCOUNTER — Telehealth: Payer: Self-pay | Admitting: *Deleted

## 2023-11-08 DIAGNOSIS — B351 Tinea unguium: Secondary | ICD-10-CM

## 2023-11-08 NOTE — Telephone Encounter (Signed)
 Copied from CRM [id number]. Topic: Clinical - Prescription Issue >> Nov 08, 2023  8:12 AM Albertha Alosa wrote: Reason for CRM: Patient called in regarding Efinaconazole  (JUBLIA ) 10 % SOLN , stated she went to the pharmacy and the medication was $200, would like for Dr.Michael to send another medication over that's more affordable

## 2023-11-12 ENCOUNTER — Other Ambulatory Visit (HOSPITAL_COMMUNITY): Payer: Self-pay

## 2023-11-12 ENCOUNTER — Telehealth: Payer: Self-pay

## 2023-11-12 MED ORDER — CICLOPIROX 8 % EX SOLN: Freq: Every day | CUTANEOUS | 11 refills | Status: DC

## 2023-11-12 NOTE — Telephone Encounter (Signed)
 Sent in a different topical to see if it is cheaper

## 2023-11-12 NOTE — Telephone Encounter (Signed)
 Pharmacy Patient Advocate Encounter   Received notification from CoverMyMeds that prior authorization for Ciclopirox 8% solution is required/requested.   Insurance verification completed.   The patient is insured through [facility] ADVANTAGE/RX ADVANCE .   Per test claim: PA required; PA submitted to above mentioned insurance via CoverMyMeds Key/confirmation #/EOC [id number] Status is pending

## 2023-11-12 NOTE — Telephone Encounter (Signed)
 Patient informed of the message below.

## 2023-11-12 NOTE — Addendum Note (Signed)
 Addended by: Aida House on: 11/12/2023 01:15 PM   Modules accepted: Orders

## 2023-11-13 ENCOUNTER — Other Ambulatory Visit (HOSPITAL_COMMUNITY): Payer: Self-pay

## 2023-11-14 DIAGNOSIS — H02831 Dermatochalasis of right upper eyelid: Secondary | ICD-10-CM | POA: Diagnosis not present

## 2023-11-14 DIAGNOSIS — H0259 Other disorders affecting eyelid function: Secondary | ICD-10-CM | POA: Diagnosis not present

## 2023-11-14 DIAGNOSIS — H02413 Mechanical ptosis of bilateral eyelids: Secondary | ICD-10-CM | POA: Diagnosis not present

## 2023-11-14 DIAGNOSIS — H53483 Generalized contraction of visual field, bilateral: Secondary | ICD-10-CM | POA: Diagnosis not present

## 2023-11-14 DIAGNOSIS — H02422 Myogenic ptosis of left eyelid: Secondary | ICD-10-CM | POA: Diagnosis not present

## 2023-11-14 DIAGNOSIS — H02411 Mechanical ptosis of right eyelid: Secondary | ICD-10-CM | POA: Diagnosis not present

## 2023-11-14 DIAGNOSIS — H02423 Myogenic ptosis of bilateral eyelids: Secondary | ICD-10-CM | POA: Diagnosis not present

## 2023-11-14 DIAGNOSIS — H02834 Dermatochalasis of left upper eyelid: Secondary | ICD-10-CM | POA: Diagnosis not present

## 2023-11-14 DIAGNOSIS — H02421 Myogenic ptosis of right eyelid: Secondary | ICD-10-CM | POA: Diagnosis not present

## 2023-11-14 DIAGNOSIS — H57813 Brow ptosis, bilateral: Secondary | ICD-10-CM | POA: Diagnosis not present

## 2023-11-14 DIAGNOSIS — H0279 Other degenerative disorders of eyelid and periocular area: Secondary | ICD-10-CM | POA: Diagnosis not present

## 2023-11-14 DIAGNOSIS — H02412 Mechanical ptosis of left eyelid: Secondary | ICD-10-CM | POA: Diagnosis not present

## 2023-11-26 NOTE — Telephone Encounter (Signed)
 Pharmacy Patient Advocate Encounter  Received notification from [facility] ADVANTAGE/RX ADVANCE that Prior Authorization for Ciclopirox  8% solution has been DENIED.  Full denial letter will be uploaded to the media tab. See denial reason below.  The reason we denied your request is because the following prior authorization criteria were not met: 3) Diagnosis of onychomycosis has been confirmed by one of the following: a) positive potassium hydroxide (KOH) preparation, OR b) culture PA #/Case ID/Reference #:  [id number]

## 2023-11-27 NOTE — Telephone Encounter (Signed)
 Please let pt know-- I'm not sure if we can bring her back to do the KOH prep in the office or not-- are we able to run a test like that in office?

## 2023-11-27 NOTE — Telephone Encounter (Signed)
 Left a detailed message at the patient's cell number stating the medication was denied as below.  Message was also left stating the patient can schedule an appt here with PCP to have the testing done if preferred.

## 2023-12-19 ENCOUNTER — Other Ambulatory Visit: Payer: PPO

## 2023-12-19 ENCOUNTER — Other Ambulatory Visit: Payer: Self-pay | Admitting: Family Medicine

## 2023-12-19 DIAGNOSIS — M1711 Unilateral primary osteoarthritis, right knee: Secondary | ICD-10-CM

## 2023-12-31 ENCOUNTER — Ambulatory Visit: Payer: Self-pay

## 2023-12-31 ENCOUNTER — Ambulatory Visit (HOSPITAL_BASED_OUTPATIENT_CLINIC_OR_DEPARTMENT_OTHER)

## 2023-12-31 ENCOUNTER — Ambulatory Visit: Admitting: Family Medicine

## 2023-12-31 NOTE — Telephone Encounter (Signed)
 Patient was scheduled for an appt today with Dr Arliss Lam.

## 2023-12-31 NOTE — Telephone Encounter (Signed)
 FYI Only or Action Required?: FYI only for provider.  Patient was last seen in primary care on 11/07/2023 by Ozell Heron HERO, MD. Called Nurse Triage reporting Rib Injury. Symptoms began a week ago. Interventions attempted: Nothing. Symptoms are: unchanged.  Triage Disposition: See Physician Within 24 Hours  Patient/caregiver understands and will follow disposition?: Yes           Copied from CRM [id number]. Topic: Clinical - Red Word Triage >> Dec 31, 2023  7:33 AM Larissa RAMAN wrote: [facility] that prompted transfer to Nurse Triage: Fall-pain in ribs Reason for Disposition  [1] MODERATE pain (e.g., interferes with normal activities) AND [2] high-risk adult (e.g., age > 60 years, osteoporosis, chronic steroid use)  Answer Assessment - Initial Assessment Questions 1. MECHANISM: How did the injury happen?     Left side ribs, fell walking dog 2. ONSET: When did the injury happen? (Minutes or hours ago)     Last Wednesday had a fall walking the dog. 3. LOCATION: Where on the chest is the injury located?     Left ribs 4. APPEARANCE: What does the injury look like?     bruising 5. BLEEDING: Is there any bleeding now? If Yes, ask: How long has it been bleeding?     denies 6. SEVERITY: Any difficulty with breathing?     denies 7. SIZE: For cuts, bruises, or swelling, ask: How large is it? (e.g., inches or centimeters)     denies 8. PAIN: Is there pain? If Yes, ask: How bad is the pain?   (e.g., Scale 1-10; or mild, moderate, severe)     moderate 9. TETANUS: For any breaks in the skin, ask: When was the last tetanus booster?     na 10. PREGNANCY: Is there any chance you are pregnant? When was your last menstrual period?       na  Protocols used: Chest Injury-A-AH

## 2023-12-31 NOTE — Telephone Encounter (Signed)
 Noted- ok to close.

## 2024-01-01 ENCOUNTER — Encounter: Payer: Self-pay | Admitting: Family Medicine

## 2024-01-01 ENCOUNTER — Ambulatory Visit (INDEPENDENT_AMBULATORY_CARE_PROVIDER_SITE_OTHER)

## 2024-01-01 ENCOUNTER — Ambulatory Visit (INDEPENDENT_AMBULATORY_CARE_PROVIDER_SITE_OTHER): Admitting: Family Medicine

## 2024-01-01 VITALS — BP 110/60 | HR 63 | Temp 97.9°F | Wt 168.4 lb

## 2024-01-01 DIAGNOSIS — S7002XA Contusion of left hip, initial encounter: Secondary | ICD-10-CM

## 2024-01-01 DIAGNOSIS — S20212A Contusion of left front wall of thorax, initial encounter: Secondary | ICD-10-CM | POA: Diagnosis not present

## 2024-01-01 DIAGNOSIS — R0781 Pleurodynia: Secondary | ICD-10-CM | POA: Diagnosis not present

## 2024-01-01 NOTE — Progress Notes (Signed)
   Subjective:    Patient ID: [name], female    DOB: May 23, 1941, 83 y.o.   MRN: [id number]  HPI Here for injuries from a fall on 12-26-23. As she was walking her dog in the street, he lunged on the leash and pulled her over. She landed on her left side striking her head, lft ribs, and left hip on the pavement. There was no LOC, and she has had no neurological symptoms at all. However she is tender along the left lateral hip and she has pain in the left ribs. She has pain on deep breathing but she no more SOB than usual. She is taking Tramadol  for the pain.   Review of Systems  Constitutional: Negative.   Respiratory: Negative.    Cardiovascular:  Positive for chest pain. Negative for palpitations and leg swelling.  Musculoskeletal:  Positive for arthralgias.  Neurological: Negative.        Objective:   Physical Exam Constitutional:      Appearance: Normal appearance.     Comments: She walks without pain   HENT:     Head: Normocephalic and atraumatic.   Cardiovascular:     Rate and Rhythm: Normal rate and regular rhythm.     Pulses: Normal pulses.     Heart sounds: Normal heart sounds.  Pulmonary:     Effort: Pulmonary effort is normal.     Breath sounds: Normal breath sounds.     Comments: She is quite tender along the left lower ribs at the anterior axillary line. No crepitus   Musculoskeletal:     Comments: She is tender and has a large ecchymosis over the left lateral hip. The hip joint itself is normal    Neurological:     General: No focal deficit present.     Mental Status: She is alert and oriented to person, place, and time.           Assessment & Plan:  She has contusions to the left hip and left ribs. We will get Xrays of the left ribs today. She can use Tramadol  as needed.  Garnette Olmsted, MD

## 2024-01-08 ENCOUNTER — Ambulatory Visit: Payer: Self-pay | Admitting: Family Medicine

## 2024-01-15 NOTE — Progress Notes (Signed)
 It can take up to 1 month, please have her let us  know if her pain continues after 1 month

## 2024-01-21 ENCOUNTER — Other Ambulatory Visit: Payer: Self-pay

## 2024-01-21 ENCOUNTER — Emergency Department (HOSPITAL_BASED_OUTPATIENT_CLINIC_OR_DEPARTMENT_OTHER)

## 2024-01-21 ENCOUNTER — Encounter (HOSPITAL_BASED_OUTPATIENT_CLINIC_OR_DEPARTMENT_OTHER): Payer: Self-pay | Admitting: *Deleted

## 2024-01-21 ENCOUNTER — Ambulatory Visit: Payer: Self-pay

## 2024-01-21 ENCOUNTER — Emergency Department (HOSPITAL_BASED_OUTPATIENT_CLINIC_OR_DEPARTMENT_OTHER)
Admission: EM | Admit: 2024-01-21 | Discharge: 2024-01-21 | Disposition: A | Discharge status: Home / Self Care | Attending: Emergency Medicine | Admitting: Emergency Medicine

## 2024-01-21 DIAGNOSIS — M542 Cervicalgia: Secondary | ICD-10-CM | POA: Insufficient documentation

## 2024-01-21 DIAGNOSIS — W01198A Fall on same level from slipping, tripping and stumbling with subsequent striking against other object, initial encounter: Secondary | ICD-10-CM | POA: Diagnosis not present

## 2024-01-21 DIAGNOSIS — S199XXA Unspecified injury of neck, initial encounter: Secondary | ICD-10-CM | POA: Diagnosis not present

## 2024-01-21 DIAGNOSIS — R519 Headache, unspecified: Secondary | ICD-10-CM | POA: Diagnosis not present

## 2024-01-21 DIAGNOSIS — S0990XA Unspecified injury of head, initial encounter: Secondary | ICD-10-CM | POA: Diagnosis present

## 2024-01-21 DIAGNOSIS — S0003XA Contusion of scalp, initial encounter: Secondary | ICD-10-CM | POA: Insufficient documentation

## 2024-01-21 DIAGNOSIS — Z79899 Other long term (current) drug therapy: Secondary | ICD-10-CM | POA: Diagnosis not present

## 2024-01-21 DIAGNOSIS — N189 Chronic kidney disease, unspecified: Secondary | ICD-10-CM | POA: Insufficient documentation

## 2024-01-21 DIAGNOSIS — I129 Hypertensive chronic kidney disease with stage 1 through stage 4 chronic kidney disease, or unspecified chronic kidney disease: Secondary | ICD-10-CM | POA: Diagnosis not present

## 2024-01-21 DIAGNOSIS — W19XXXA Unspecified fall, initial encounter: Secondary | ICD-10-CM

## 2024-01-21 DIAGNOSIS — S06310A Contusion and laceration of right cerebrum without loss of consciousness, initial encounter: Secondary | ICD-10-CM | POA: Diagnosis not present

## 2024-01-21 NOTE — Telephone Encounter (Signed)
  FYI Only or Action Required?: FYI only for provider.  Patient was last seen in primary care on 01/01/2024 by Johnny Garnette LABOR, MD.  Called Nurse Triage reporting Head Injury.  Symptoms began yesterday.  Interventions attempted: Nothing.  Symptoms are: unchanged.  Triage Disposition: No disposition on file.  Patient/caregiver understands and will follow disposition?:  To ER  Copied from CRM [id number]. Topic: Clinical - Red Word Triage >> Jan 21, 2024  7:37 AM [name] wrote: Red Word that prompted transfer to Nurse Triage: Fall; injury to head; Reason for Disposition  Large swelling or bruise (> 2 inches or 5 cm)  Answer Assessment - Initial Assessment Questions 1. MECHANISM: How did the injury happen? For falls, ask: What height did you fall from? and What surface did you fall against?      Was getting garbage cans out to the road and lost footing and fell on the pavement and hit head; states big goose egg. 2. ONSET: When did the injury happen? (e.g., minutes, hours ago)      yesterday 3. NEUROLOGIC SYMPTOMS: Was there any loss of consciousness? Are there any other neurological symptoms?      no 4. MENTAL STATUS: Does the person know who they are, who you are, and where they are?      no 5. LOCATION: What part of the head was hit?      Top of head on right hand side 6. SCALP APPEARANCE: What does the scalp look like? Is it bleeding now? If Yes, ask: Is it difficult to stop?      Big goose egg 7. SIZE: For cuts, bruises, or swelling, ask: How large is it? (e.g., inches or centimeters)      denies 8. PAIN: Is there any pain? If Yes, ask: How bad is it? (Scale 0-10; or none, mild, moderate, severe)     States banged up pretty good, scratches and bruises 9. TETANUS: For any breaks in the skin, ask: When was your last tetanus booster?     na 10. BLOOD THINNERS: Do you take any blood thinners? (e.g., aspirin , clopidogrel / Plavix, coumadin, heparin ).  Notes: Other strong blood thinners include: Arixtra (fondaparinux), Eliquis  (apixaban ), Pradaxa (dabigatran), and Xarelto  (rivaroxaban ).       denies 11. OTHER SYMPTOMS: Do you have any other symptoms? (e.g., neck pain, vomiting)       Neck pain 12. PREGNANCY: Is there any chance you are pregnant? When was your last menstrual period?       na  Protocols used: Head Injury-A-AH

## 2024-01-21 NOTE — Discharge Instructions (Addendum)
 Please use Tylenol  or ibuprofen  for pain.  You may use 600 mg ibuprofen  every 6 hours or 1000 mg of Tylenol  every 6 hours.  You may choose to alternate between the 2.  This would be most effective.  Not to exceed 4 g of Tylenol  within 24 hours.  Not to exceed 3200 mg ibuprofen  24 hours.  All of your scans were clear, there was no evidence of any fracture of your neck, bleeding in the brain, they just noticed the large bruise to the back of your head otherwise known as a hematoma.

## 2024-01-21 NOTE — Telephone Encounter (Signed)
 Noted- ok to close.

## 2024-01-21 NOTE — ED Triage Notes (Signed)
 Pt to ED after mechanical fall yesterday while taking out trash. Patient hit the back of her head and has swelling noted but no bleeding. No loss of consciousness. Patient reporting intermittent headache and neck pain since fall.   No blood thinners.

## 2024-01-21 NOTE — ED Provider Notes (Signed)
 [place] EMERGENCY DEPARTMENT AT [facility] Provider Note   CSN: [id number] Arrival date & time: 01/21/24  1110     Patient presents with: [name]   ESTHELA BRANDNER is a 83 y.o. female with past medical history significant for hypertension, hyperlipidemia, fibromyalgia, CKD who presents after mechanical fall yesterday while taking out the trash.  She denies dizziness, chest pain, shortness of breath prior to fall.  She just feels like she tripped.  She hit the back of her head and has some swelling noted but no bleeding.  No loss of consciousness.  No vision changes, confusion, numbness, tingling, weakness today.  Has had some mild headache and neck pain since the fall.  Does not take any blood thinners.  Rates pain 4/10.    Fall       Prior to Admission medications   Medication Sig Start Date End Date Taking? Authorizing Provider  albuterol  (PROVENTIL ) (2.5 MG/3ML) 0.083% nebulizer solution Take 3 mLs (2.5 mg total) by nebulization every 6 (six) hours as needed for wheezing or shortness of breath. 03/29/23   Ozell Heron HERO, MD  albuterol  (VENTOLIN  HFA) 108 [phone] Base) MCG/ACT inhaler Inhale 2 puffs into the lungs every 6 (six) hours as needed for wheezing or shortness of breath. 03/27/23   [place], Betty G, MD  amLODipine  (NORVASC ) 5 MG tablet TAKE 1 TABLET BY MOUTH DAILY 10/24/23   Ozell Heron HERO, MD  atenolol  (TENORMIN ) 25 MG tablet Take 1 tablet (25 mg total) by mouth daily. 11/07/23   Ozell Heron HERO, MD  cetirizine (ZYRTEC) 10 MG tablet Take 10 mg by mouth daily.    [provider]  ciclopirox  (PENLAC ) 8 % solution Apply topically at bedtime. Apply over nail and surrounding skin. Apply daily over previous coat. After seven (7) days, may remove with alcohol  and continue cycle. 11/12/23   Ozell Heron HERO, MD  cyclobenzaprine  (FLEXERIL ) 10 MG tablet Take 1 tablet (10 mg total) by mouth at bedtime. 11/07/23   Ozell Heron HERO, MD  Efinaconazole  (JUBLIA ) 10 % SOLN  Apply 2 drops to the big toe and 1 drop to the other 4 toes daily. 11/07/23   Ozell Heron HERO, MD  furosemide  (LASIX ) 20 MG tablet Take 1 tablet (20 mg total) by mouth every other day. 08/23/22   Wyn Jackee VEAR Mickey., NP  hydroxypropyl methylcellulose / hypromellose (ISOPTO TEARS / GONIOVISC) 2.5 % ophthalmic solution Place 1 drop into both eyes as needed for dry eyes.    [provider]  pregabalin  (LYRICA ) 75 MG capsule Take 1 capsule (75 mg total) by mouth 2 (two) times daily. 11/07/23   Ozell Heron HERO, MD  primidone  (MYSOLINE ) 50 MG tablet TAKE 1 TABLET BY MOUTH TWICE A DAY 08/10/23   Ozell Heron HERO, MD  simvastatin  (ZOCOR ) 40 MG tablet Take 1 tablet (40 mg total) by mouth at bedtime. 08/26/15   Dann Candyce RAMAN, MD  traMADol  (ULTRAM ) 50 MG tablet TAKE 1 TABLET BY MOUTH EVERY 8 HOURS AS NEEDED 12/21/23   Burchette, Wolm LELON, MD  traZODone  (DESYREL ) 50 MG tablet TAKE 1 TABLET BY MOUTH EVERY NIGHT AT BEDTIME AS NEEDED FOR SLEEP 06/15/23   Ozell Heron HERO, MD  triamcinolone  cream (KENALOG ) 0.1 % Apply 1 Application topically 2 (two) times daily as needed (itching). 01/18/23   Ozell Heron HERO, MD    Allergies: Propoxyphene, Erythromycin base, Hctz [hydrochlorothiazide], Lisinopril, and Sulfa antibiotics    Review of Systems  All other systems reviewed and are negative.  Updated Vital Signs BP 124/68 (BP Location: Left Arm)   Pulse 65   Temp (!) 97.2 F (36.2 C) (Temporal)   Resp 14   SpO2 95%   Physical Exam Vitals and nursing note reviewed.  Constitutional:      General: She is not in acute distress.    Appearance: Normal appearance.  HENT:     Head: Normocephalic and atraumatic.  Eyes:     General:        Right eye: No discharge.        Left eye: No discharge.  Cardiovascular:     Rate and Rhythm: Normal rate and regular rhythm.     Heart sounds: No murmur heard.    No friction rub. No gallop.  Pulmonary:     Effort: Pulmonary effort is normal.      Breath sounds: Normal breath sounds.  Abdominal:     General: Bowel sounds are normal.     Palpations: Abdomen is soft.  Skin:    General: Skin is warm and dry.     Capillary Refill: Capillary refill takes less than 2 seconds.     Comments: Small hematoma noted to posterior scalp, no active bleeding.  Neurological:     Mental Status: She is alert and oriented to person, place, and time.     Comments: Intact strength 5/5 of bilateral upper and lower extremities.  Can ambulate without difficulty.  Mild tenderness to palpation in cervical paraspinous muscles.  No midline cervical spinal tenderness, step-off, nor deformity.  Psychiatric:        Mood and Affect: Mood normal.        Behavior: Behavior normal.     (all labs ordered are listed, but only abnormal results are displayed) Labs Reviewed - No data to display  EKG: None  Radiology: CT Head Wo Contrast Result Date: 01/21/2024 CLINICAL DATA:  Head trauma, minor (Age >= 65y); Neck trauma (Age >= 65y). fall yesterday while taking out trash. Patient hit the back of her head and has swelling noted but no bleeding. No loss of consciousness. Patient reporting intermittent headache and neck pain EXAM: CT HEAD WITHOUT CONTRAST CT CERVICAL SPINE WITHOUT CONTRAST TECHNIQUE: Multidetector CT imaging of the head and cervical spine was performed following the standard protocol without intravenous contrast. Multiplanar CT image reconstructions of the cervical spine were also generated. RADIATION DOSE REDUCTION: This exam was performed according to the departmental dose-optimization program which includes automated exposure control, adjustment of the mA and/or kV according to patient size and/or use of iterative reconstruction technique. COMPARISON:  MRI head 01/23/2021, CT head 04/03/2013 FINDINGS: CT HEAD FINDINGS Brain: Patchy and confluent areas of decreased attenuation are noted throughout the deep and periventricular white matter of the cerebral  hemispheres bilaterally, compatible with chronic microvascular ischemic disease. No evidence of large-territorial acute infarction. No parenchymal hemorrhage. No mass lesion. No extra-axial collection. No mass effect or midline shift. No hydrocephalus. Basilar cisterns are patent. Vascular: No hyperdense vessel. Atherosclerotic calcifications are present within the cavernous internal carotid arteries. Skull: No acute fracture or focal lesion. Sinuses/Orbits: Paranasal sinuses and mastoid air cells are clear. The orbits are unremarkable. Other: Right occipital scalp acute 11 mm hematoma. CT CERVICAL SPINE FINDINGS Alignment: Grade 1 anterolisthesis noted C5 on C6 and C6 on C7. Skull base and vertebrae: Multilevel moderate degenerative changes of the spine most prominent at the C6-C7 level. Associated C4-C5 osseous neural foraminal stenosis bilaterally. No associated severe osseous central canal stenosis. No acute fracture. No  aggressive appearing focal osseous lesion or focal pathologic process. Soft tissues and spinal canal: No prevertebral fluid or swelling. No visible canal hematoma. Upper chest: Biapical pleural/pulmonary scarring. Other: Atherosclerotic plaque of the carotid arteries within the neck. IMPRESSION: 1. No acute intracranial abnormality. 2. No acute displaced fracture or traumatic listhesis of the cervical spine. 3. Right occipital scalp acute 11 mm hematoma. Electronically Signed   By: Morgane  Naveau M.D.   On: 01/21/2024 13:16   CT Cervical Spine Wo Contrast Result Date: 01/21/2024 CLINICAL DATA:  Head trauma, minor (Age >= 65y); Neck trauma (Age >= 65y). fall yesterday while taking out trash. Patient hit the back of her head and has swelling noted but no bleeding. No loss of consciousness. Patient reporting intermittent headache and neck pain EXAM: CT HEAD WITHOUT CONTRAST CT CERVICAL SPINE WITHOUT CONTRAST TECHNIQUE: Multidetector CT imaging of the head and cervical spine was performed  following the standard protocol without intravenous contrast. Multiplanar CT image reconstructions of the cervical spine were also generated. RADIATION DOSE REDUCTION: This exam was performed according to the departmental dose-optimization program which includes automated exposure control, adjustment of the mA and/or kV according to patient size and/or use of iterative reconstruction technique. COMPARISON:  MRI head 01/23/2021, CT head 04/03/2013 FINDINGS: CT HEAD FINDINGS Brain: Patchy and confluent areas of decreased attenuation are noted throughout the deep and periventricular white matter of the cerebral hemispheres bilaterally, compatible with chronic microvascular ischemic disease. No evidence of large-territorial acute infarction. No parenchymal hemorrhage. No mass lesion. No extra-axial collection. No mass effect or midline shift. No hydrocephalus. Basilar cisterns are patent. Vascular: No hyperdense vessel. Atherosclerotic calcifications are present within the cavernous internal carotid arteries. Skull: No acute fracture or focal lesion. Sinuses/Orbits: Paranasal sinuses and mastoid air cells are clear. The orbits are unremarkable. Other: Right occipital scalp acute 11 mm hematoma. CT CERVICAL SPINE FINDINGS Alignment: Grade 1 anterolisthesis noted C5 on C6 and C6 on C7. Skull base and vertebrae: Multilevel moderate degenerative changes of the spine most prominent at the C6-C7 level. Associated C4-C5 osseous neural foraminal stenosis bilaterally. No associated severe osseous central canal stenosis. No acute fracture. No aggressive appearing focal osseous lesion or focal pathologic process. Soft tissues and spinal canal: No prevertebral fluid or swelling. No visible canal hematoma. Upper chest: Biapical pleural/pulmonary scarring. Other: Atherosclerotic plaque of the carotid arteries within the neck. IMPRESSION: 1. No acute intracranial abnormality. 2. No acute displaced fracture or traumatic listhesis of  the cervical spine. 3. Right occipital scalp acute 11 mm hematoma. Electronically Signed   By: Morgane  Naveau M.D.   On: 01/21/2024 13:16     Procedures   Medications Ordered in the ED - No data to display                                  Medical Decision Making  This patient is a 83 y.o. female  who presents to the ED for concern of head injury.   Differential diagnoses prior to evaluation: The emergent differential diagnosis includes, but is not limited to,  epidural hematoma, subdural hematoma, skull fracture, subarachnoid hemorrhage, unstable cervical spine fracture, concussion vs other MSK injury   . This is not an exhaustive differential.   Past Medical History / Co-morbidities / Social History: HTN, HLD, fibromyalgia  Physical Exam: Physical exam performed. The pertinent findings include: Intact strength 5/5 of bilateral upper and lower extremities.  Can ambulate without difficulty.  Mild tenderness to palpation in cervical paraspinous muscles.  No midline cervical spinal tenderness, step-off, nor deformity.   Small hematoma noted to posterior scalp, no active bleeding.   Lab Tests/Imaging studies: I personally interpreted labs/imaging and the pertinent results include:  CT head and CT cervical spine shows hematoma, no other acute fracture or intracranial injury noted. I agree with the radiologist interpretation.  Medications: Encouraged ibuprofen , Tylenol , rest, otherwise reassured no acute findings noted.   Disposition: After consideration of the diagnostic results and the patients response to treatment, I feel that patient is stable for discharge with plan as above .   emergency department workup does not suggest an emergent condition requiring admission or immediate intervention beyond what has been performed at this time. The plan is: as above. The patient is safe for discharge and has been instructed to return immediately for worsening symptoms, change in symptoms or  any other concerns.   Final diagnoses:  Fall, initial encounter  Hematoma of scalp, initial encounter    ED Discharge Orders     None          Rosan Sherlean DEL, PA-C 01/21/24 1329    Franklyn Sid SAILOR, MD 01/21/24 1443

## 2024-01-23 ENCOUNTER — Telehealth: Payer: Self-pay

## 2024-01-23 NOTE — Transitions of Care (Post Inpatient/ED Visit) (Signed)
   01/23/2024  Name: JANITZA REVUELTA MRN: [id number] DOB: May 21, 1941  Today's TOC FU Call Status: Today's TOC FU Call Status:: Successful TOC FU Call Completed TOC FU Call Complete Date: 01/23/24 Patient's Name and Date of Birth confirmed.  Transition Care Management Follow-up Telephone Call Date of Discharge: 01/21/24 Discharge Facility: [facility] ([facility]) Type of Discharge: Emergency Department Reason for ED Visit: Other: How have you been since you were released from the hospital?: Better Any questions or concerns?: No  Items Reviewed: Did you receive and understand the discharge instructions provided?: Yes Medications obtained,verified, and reconciled?: Yes (Medications Reviewed) Any new allergies since your discharge?: No Dietary orders reviewed?: No Do you have support at home?: Yes People in Home [RPT]: child(ren), adult Name of Support/Comfort Primary Source: carol  Medications Reviewed Today: Medications Reviewed Today   Medications were not reviewed in this encounter     Home Care and Equipment/Supplies: Were Home Health Services Ordered?: No Any new equipment or medical supplies ordered?: No  Functional Questionnaire: Do you need assistance with bathing/showering or dressing?: No Do you need assistance with meal preparation?: No Do you need assistance with eating?: No Do you have difficulty maintaining continence: No Do you need assistance with getting out of bed/getting out of a chair/moving?: No Do you have difficulty managing or taking your medications?: No  Follow up appointments reviewed: PCP Follow-up appointment confirmed?: Yes Date of PCP follow-up appointment?: 02/04/24 Follow-up Provider: Dr. Ozell Driscilla Salvage Follow-up appointment confirmed?: NA Do you need transportation to your follow-up appointment?: No Do you understand care options if your condition(s) worsen?: Yes-patient verbalized understanding    SIGNATURE Alexie Samson,CMA

## 2024-01-28 DIAGNOSIS — H53483 Generalized contraction of visual field, bilateral: Secondary | ICD-10-CM | POA: Diagnosis not present

## 2024-01-28 DIAGNOSIS — H02412 Mechanical ptosis of left eyelid: Secondary | ICD-10-CM | POA: Diagnosis not present

## 2024-01-28 DIAGNOSIS — H02831 Dermatochalasis of right upper eyelid: Secondary | ICD-10-CM | POA: Diagnosis not present

## 2024-01-28 DIAGNOSIS — H02834 Dermatochalasis of left upper eyelid: Secondary | ICD-10-CM | POA: Diagnosis not present

## 2024-01-28 DIAGNOSIS — H0279 Other degenerative disorders of eyelid and periocular area: Secondary | ICD-10-CM | POA: Diagnosis not present

## 2024-01-28 DIAGNOSIS — H57813 Brow ptosis, bilateral: Secondary | ICD-10-CM | POA: Diagnosis not present

## 2024-01-28 DIAGNOSIS — H02413 Mechanical ptosis of bilateral eyelids: Secondary | ICD-10-CM | POA: Diagnosis not present

## 2024-01-28 DIAGNOSIS — H0259 Other disorders affecting eyelid function: Secondary | ICD-10-CM | POA: Diagnosis not present

## 2024-01-28 DIAGNOSIS — H02422 Myogenic ptosis of left eyelid: Secondary | ICD-10-CM | POA: Diagnosis not present

## 2024-01-28 DIAGNOSIS — H02411 Mechanical ptosis of right eyelid: Secondary | ICD-10-CM | POA: Diagnosis not present

## 2024-01-28 DIAGNOSIS — H02423 Myogenic ptosis of bilateral eyelids: Secondary | ICD-10-CM | POA: Diagnosis not present

## 2024-01-28 DIAGNOSIS — H02421 Myogenic ptosis of right eyelid: Secondary | ICD-10-CM | POA: Diagnosis not present

## 2024-02-04 ENCOUNTER — Encounter: Payer: Self-pay | Admitting: Family Medicine

## 2024-02-04 ENCOUNTER — Ambulatory Visit (INDEPENDENT_AMBULATORY_CARE_PROVIDER_SITE_OTHER): Admitting: Family Medicine

## 2024-02-04 VITALS — BP 130/78 | HR 73 | Temp 98.1°F | Ht 64.5 in | Wt 169.5 lb

## 2024-02-04 DIAGNOSIS — W19XXXD Unspecified fall, subsequent encounter: Secondary | ICD-10-CM

## 2024-02-04 DIAGNOSIS — S0003XD Contusion of scalp, subsequent encounter: Secondary | ICD-10-CM | POA: Diagnosis not present

## 2024-02-04 NOTE — Progress Notes (Signed)
 Established Patient Office Visit  Subjective   Patient ID: [name], female    DOB: 02/23/1941  Age: 83 y.o. MRN: [id number]  Chief Complaint  Patient presents with   Hospitalization Follow-up    Pt is here for ER follow up today. She had tripped while taking out the trash and hit her head. There was no loss of consciousness. She had a small hematoma on the scalp but the CT of the head did not reveal any acute pathology. Since the fall the patient has felt ok. States that since the fall she has felt ok, no headaches, no confusion or disorientation, no blurry vision, no dizziness or vertigo symptoms, no weakness on one side of her body or the other. No numbness to tingling. States that she also had a blepharoplasty a few weeks before her fall and that is why her face is bruised still.  This is her second fall in 2 months, patient states there is no loss of confidence in her ability to walk or get around without assistance.   Pt reports she will be starting pelvic floor therapy soon for her incontinence.     Current Outpatient Medications  Medication Instructions   albuterol  (PROVENTIL ) 2.5 mg, Nebulization, Every 6 hours PRN   albuterol  (VENTOLIN  HFA) 108 (90 Base) MCG/ACT inhaler 2 puffs, Inhalation, Every 6 hours PRN   amLODipine  (NORVASC ) 5 mg, Oral, Daily   atenolol  (TENORMIN ) 25 mg, Oral, Daily   cetirizine (ZYRTEC) 10 mg, Daily   ciclopirox  (PENLAC ) 8 % solution Topical, Daily at bedtime, Apply over nail and surrounding skin. Apply daily over previous coat. After seven (7) days, may remove with alcohol  and continue cycle.   cyclobenzaprine  (FLEXERIL ) 10 mg, Oral, Daily at bedtime   Efinaconazole  (JUBLIA ) 10 % SOLN Apply 2 drops to the big toe and 1 drop to the other 4 toes daily.   furosemide  (LASIX ) 20 mg, Oral, Every other day   hydroxypropyl methylcellulose / hypromellose (ISOPTO TEARS / GONIOVISC) 2.5 % ophthalmic solution 1 drop, As needed   pregabalin  (LYRICA ) 75 mg,  Oral, 2 times daily   primidone  (MYSOLINE ) 50 mg, Oral, 2 times daily   simvastatin  (ZOCOR ) 40 mg, Oral, Daily at bedtime   traMADol  (ULTRAM ) 50 mg, Oral, Every 8 hours PRN   traZODone  (DESYREL ) 50 MG tablet TAKE 1 TABLET BY MOUTH EVERY NIGHT AT BEDTIME AS NEEDED FOR SLEEP   triamcinolone  cream (KENALOG ) 0.1 % 1 Application, Topical, 2 times daily PRN    Patient Active Problem List   Diagnosis Date Noted   DVT (deep venous thrombosis) (HCC) 04/26/2022   AKI (acute kidney injury) (HCC)    Pressure injury of skin 01/23/2021   OSA (obstructive sleep apnea) 08/23/2020   Dysphonia of essential tremor 06/21/2020   Essential tremor 06/21/2020   Chronic insomnia 06/21/2020   Status post total knee replacement, left 04/13/2017   Vitamin D deficiency 04/12/2017   History of chronic kidney disease 04/12/2017   Osteopenia of multiple sites 04/06/2017   Fibromyalgia syndrome 09/11/2016   Primary insomnia 09/11/2016   History of osteopenia 09/11/2016   Essential hypertension, benign 12/16/2013   Pure hypercholesterolemia 12/16/2013      Review of Systems  All other systems reviewed and are negative.     Objective:     BP 130/78   Pulse 73   Temp 98.1 F (36.7 C) (Oral)   Ht 5' 4.5 (1.638 m)   Wt 169 lb 8 oz (76.9 kg)  SpO2 96%   BMI 28.65 kg/m    Physical Exam Constitutional:      Appearance: Normal appearance. She is normal weight.  Eyes:     Extraocular Movements: Extraocular movements intact.     Conjunctiva/sclera: Conjunctivae normal.     Pupils: Pupils are equal, round, and reactive to light.     Comments: There is ecchynosis on the face around the eyes from her recent blepharoplasty, incision sites are well healed, no redness or other issues.  Pulmonary:     Effort: Pulmonary effort is normal.  Neurological:     General: No focal deficit present.     Mental Status: She is alert and oriented to person, place, and time. Mental status is at baseline.     Cranial  Nerves: No cranial nerve deficit.     Sensory: No sensory deficit.     Gait: Gait normal.  Psychiatric:        Mood and Affect: Mood normal.        Behavior: Behavior normal.      No results found for any visits on 02/04/24.    The ASCVD Risk score (Arnett DK, et al., 2019) failed to calculate for the following reasons:   The 2019 ASCVD risk score is only valid for ages 22 to 42    Assessment & Plan:  Fall, subsequent encounter   No signs of neurologic deficits, the hematoma on the scalp is small, resolving, non tender, no red flag symptoms per the patient, I counseled her on the possible need for physical therapy for balance rehab however pt states she did not lose her balance, just tripped. I advised that if it happens again then she should reach out and I will order physical therapy for the frequent falls. 20 minutes spent reviewing ED notes, labs, imaging studies and counseling patient.   No follow-ups on file.    Heron CHRISTELLA Sharper, MD

## 2024-03-12 ENCOUNTER — Other Ambulatory Visit: Payer: Self-pay | Admitting: Medical Genetics

## 2024-03-17 ENCOUNTER — Telehealth: Payer: Self-pay | Admitting: *Deleted

## 2024-03-17 DIAGNOSIS — Z23 Encounter for immunization: Secondary | ICD-10-CM

## 2024-03-17 NOTE — Telephone Encounter (Signed)
 Copied from CRM [id number]. Topic: Clinical - Medication Question >> Mar 17, 2024 11:38 AM [place] A wrote: Reason for CRM: Patient would like a Covid Vaccine and prescription sent to [facility] contact

## 2024-03-18 DIAGNOSIS — N3941 Urge incontinence: Secondary | ICD-10-CM | POA: Diagnosis not present

## 2024-03-18 DIAGNOSIS — N3943 Post-void dribbling: Secondary | ICD-10-CM | POA: Diagnosis not present

## 2024-03-18 DIAGNOSIS — M6281 Muscle weakness (generalized): Secondary | ICD-10-CM | POA: Diagnosis not present

## 2024-03-18 DIAGNOSIS — N3942 Incontinence without sensory awareness: Secondary | ICD-10-CM | POA: Diagnosis not present

## 2024-03-18 MED ORDER — COVID-19 MRNA VACC (MODERNA) 50 MCG/0.5ML IM SUSP: 0.5000 mL | Freq: Once | INTRAMUSCULAR | 0 refills | Status: AC

## 2024-03-18 NOTE — Telephone Encounter (Signed)
 Patient informed order sent as requested below.

## 2024-03-19 DIAGNOSIS — M6281 Muscle weakness (generalized): Secondary | ICD-10-CM | POA: Diagnosis not present

## 2024-03-19 DIAGNOSIS — N3943 Post-void dribbling: Secondary | ICD-10-CM | POA: Diagnosis not present

## 2024-03-19 DIAGNOSIS — N3942 Incontinence without sensory awareness: Secondary | ICD-10-CM | POA: Diagnosis not present

## 2024-03-19 DIAGNOSIS — N3941 Urge incontinence: Secondary | ICD-10-CM | POA: Diagnosis not present

## 2024-03-20 ENCOUNTER — Telehealth: Payer: Self-pay | Admitting: *Deleted

## 2024-03-20 DIAGNOSIS — M1711 Unilateral primary osteoarthritis, right knee: Secondary | ICD-10-CM

## 2024-03-20 MED ORDER — TRAMADOL HCL 50 MG PO TABS: 50.0000 mg | ORAL_TABLET | Freq: Three times a day (TID) | ORAL | 0 refills | Status: DC | PRN

## 2024-03-20 NOTE — Telephone Encounter (Signed)
 Script sent

## 2024-03-20 NOTE — Telephone Encounter (Signed)
 Arloa Prior faxed a refill request for Tramadol  50mg .  Message sent to PCP.

## 2024-04-03 DIAGNOSIS — N3942 Incontinence without sensory awareness: Secondary | ICD-10-CM | POA: Diagnosis not present

## 2024-04-03 DIAGNOSIS — M6281 Muscle weakness (generalized): Secondary | ICD-10-CM | POA: Diagnosis not present

## 2024-04-03 DIAGNOSIS — R102 Pelvic and perineal pain: Secondary | ICD-10-CM | POA: Diagnosis not present

## 2024-04-03 DIAGNOSIS — M6289 Other specified disorders of muscle: Secondary | ICD-10-CM | POA: Diagnosis not present

## 2024-04-23 ENCOUNTER — Other Ambulatory Visit: Payer: Self-pay | Admitting: Family Medicine

## 2024-04-23 DIAGNOSIS — I10 Essential (primary) hypertension: Secondary | ICD-10-CM

## 2024-04-28 ENCOUNTER — Other Ambulatory Visit: Payer: Self-pay | Admitting: Family Medicine

## 2024-04-28 DIAGNOSIS — M797 Fibromyalgia: Secondary | ICD-10-CM

## 2024-05-03 ENCOUNTER — Other Ambulatory Visit: Payer: Self-pay | Admitting: Family Medicine

## 2024-05-03 DIAGNOSIS — I10 Essential (primary) hypertension: Secondary | ICD-10-CM

## 2024-05-03 DIAGNOSIS — M797 Fibromyalgia: Secondary | ICD-10-CM

## 2024-05-09 ENCOUNTER — Encounter: Admitting: Family

## 2024-05-09 ENCOUNTER — Encounter: Admitting: Family Medicine

## 2024-05-16 ENCOUNTER — Ambulatory Visit (INDEPENDENT_AMBULATORY_CARE_PROVIDER_SITE_OTHER): Admitting: Family Medicine

## 2024-05-16 ENCOUNTER — Encounter: Payer: Self-pay | Admitting: Family Medicine

## 2024-05-16 VITALS — BP 112/58 | HR 70 | Temp 97.8°F | Ht 63.5 in | Wt 171.3 lb

## 2024-05-16 DIAGNOSIS — E7800 Pure hypercholesterolemia, unspecified: Secondary | ICD-10-CM | POA: Diagnosis not present

## 2024-05-16 DIAGNOSIS — Z131 Encounter for screening for diabetes mellitus: Secondary | ICD-10-CM

## 2024-05-16 DIAGNOSIS — Z0000 Encounter for general adult medical examination without abnormal findings: Secondary | ICD-10-CM | POA: Diagnosis not present

## 2024-05-16 DIAGNOSIS — Z780 Asymptomatic menopausal state: Secondary | ICD-10-CM

## 2024-05-16 DIAGNOSIS — E559 Vitamin D deficiency, unspecified: Secondary | ICD-10-CM

## 2024-05-16 DIAGNOSIS — Z1231 Encounter for screening mammogram for malignant neoplasm of breast: Secondary | ICD-10-CM | POA: Diagnosis not present

## 2024-05-16 DIAGNOSIS — I10 Essential (primary) hypertension: Secondary | ICD-10-CM

## 2024-05-16 DIAGNOSIS — Z23 Encounter for immunization: Secondary | ICD-10-CM | POA: Diagnosis not present

## 2024-05-16 LAB — COMPREHENSIVE METABOLIC PANEL WITH GFR
ALT: 13 U/L (ref 0–35)
AST: 22 U/L (ref 0–37)
Albumin: 4.4 g/dL (ref 3.5–5.2)
Alkaline Phosphatase: 81 U/L (ref 39–117)
BUN: 15 mg/dL (ref 6–23)
CO2: 28 meq/L (ref 19–32)
Calcium: 9.1 mg/dL (ref 8.4–10.5)
Chloride: 97 meq/L (ref 96–112)
Creatinine, Ser: 0.91 mg/dL (ref 0.40–1.20)
GFR: 57.96 mL/min — ABNORMAL LOW (ref >60.00)
Glucose, Bld: 93 mg/dL (ref 70–99)
Potassium: 4.6 meq/L (ref 3.5–5.1)
Sodium: 134 meq/L — ABNORMAL LOW (ref 135–145)
Total Bilirubin: 0.4 mg/dL (ref 0.2–1.2)
Total Protein: 7.2 g/dL (ref 6.0–8.3)

## 2024-05-16 LAB — LIPID PANEL
Cholesterol: 150 mg/dL (ref 0–200)
HDL: 59.1 mg/dL (ref >39.00)
LDL Cholesterol: 57 mg/dL (ref 0–99)
NonHDL: 90.94
Total CHOL/HDL Ratio: 3
Triglycerides: 170 mg/dL — ABNORMAL HIGH (ref 0.0–149.0)
VLDL: 34 mg/dL (ref 0.0–40.0)

## 2024-05-16 LAB — VITAMIN D 25 HYDROXY (VIT D DEFICIENCY, FRACTURES): VITD: 30.63 ng/mL (ref 30.00–100.00)

## 2024-05-16 LAB — HEMOGLOBIN A1C: Hgb A1c MFr Bld: 5.6 % (ref 4.6–6.5)

## 2024-05-16 NOTE — Patient Instructions (Addendum)
 Consider Shingles vaccination series -- 2 shot series. Can go to any pharmacy to have them done   Schedule DEXA scan at checkout

## 2024-05-16 NOTE — Progress Notes (Signed)
 Complete physical exam  Patient: [name]   DOB: 1941/06/17   83 y.o. Female  MRN: [id number]  Subjective:    Chief Complaint  Patient presents with   Medicare Wellness    [name] is a 83 y.o. female who presents today for a complete physical exam. She reports consuming a regular diet. Exercise is limited by neurologic condition(s): fibromyalgia. She generally feels well. She reports sleeping fairly well. She does not have additional problems to discuss today.    Most recent fall risk assessment:    05/16/2024    9:34 AM  Fall Risk   Falls in the past year? 1  Number falls in past yr: 1  Injury with Fall? 1  Risk for fall due to : Impaired balance/gait  Follow up Falls evaluation completed     Most recent depression screenings:    05/16/2024    9:36 AM 08/13/2023    3:16 PM  PHQ 2/9 Scores  PHQ - 2 Score 0 0    Vision:Within last year and sees Dr. Zaldivar regularly and Dental: No current dental problems and Receives regular dental care  Patient Active Problem List   Diagnosis Date Noted   DVT (deep venous thrombosis) (HCC) 04/26/2022   AKI (acute kidney injury)    Pressure injury of skin 01/23/2021   OSA (obstructive sleep apnea) 08/23/2020   Dysphonia of essential tremor 06/21/2020   Essential tremor 06/21/2020   Chronic insomnia 06/21/2020   Status post total knee replacement, left 04/13/2017   Vitamin D deficiency 04/12/2017   History of chronic kidney disease 04/12/2017   Osteopenia of multiple sites 04/06/2017   Fibromyalgia syndrome 09/11/2016   Primary insomnia 09/11/2016   History of osteopenia 09/11/2016   Essential hypertension, benign 12/16/2013   Pure hypercholesterolemia 12/16/2013      Patient Care Team: Ozell Heron HERO, MD as PCP - General (Family Medicine) Dann Candyce RAMAN, MD as PCP - Cardiology (Cardiology) Livingston Rigg, MD as Consulting Physician (Dermatology) Porter Andrez SAUNDERS, PA-C (Inactive) as Physician Assistant  (Dermatology)   Outpatient Medications Prior to Visit  Medication Sig   amLODipine  (NORVASC ) 5 MG tablet TAKE 1 TABLET BY MOUTH DAILY   atenolol  (TENORMIN ) 25 MG tablet TAKE 1 TABLET BY MOUTH DAILY   cetirizine (ZYRTEC) 10 MG tablet Take 10 mg by mouth daily. (Patient taking differently: Take 10 mg by mouth as needed.)   cyclobenzaprine  (FLEXERIL ) 10 MG tablet TAKE 1 TABLET BY MOUTH AT BEDTIME   hydroxypropyl methylcellulose / hypromellose (ISOPTO TEARS / GONIOVISC) 2.5 % ophthalmic solution Place 1 drop into both eyes as needed for dry eyes.   pregabalin  (LYRICA ) 75 MG capsule TAKE 1 CAPSULE BY MOUTH 2 TIMES A DAY   primidone  (MYSOLINE ) 50 MG tablet TAKE 1 TABLET BY MOUTH TWICE A DAY   simvastatin  (ZOCOR ) 40 MG tablet Take 1 tablet (40 mg total) by mouth at bedtime.   traMADol  (ULTRAM ) 50 MG tablet Take 1 tablet (50 mg total) by mouth every 8 (eight) hours as needed.   traZODone  (DESYREL ) 50 MG tablet TAKE 1 TABLET BY MOUTH EVERY NIGHT AT BEDTIME AS NEEDED FOR SLEEP   [DISCONTINUED] albuterol  (PROVENTIL ) (2.5 MG/3ML) 0.083% nebulizer solution Take 3 mLs (2.5 mg total) by nebulization every 6 (six) hours as needed for wheezing or shortness of breath.   [DISCONTINUED] albuterol  (VENTOLIN  HFA) 108 (90 Base) MCG/ACT inhaler Inhale 2 puffs into the lungs every 6 (six) hours as needed for wheezing or shortness of breath.   [  DISCONTINUED] furosemide  (LASIX ) 20 MG tablet Take 1 tablet (20 mg total) by mouth every other day.   [DISCONTINUED] triamcinolone  cream (KENALOG ) 0.1 % Apply 1 Application topically 2 (two) times daily as needed (itching).   Facility-Administered Medications Prior to Visit  Medication Dose Route Frequency Provider   ipratropium-albuterol  (DUONEB) 0.5-2.5 (3) MG/3ML nebulizer solution 3 mL  3 mL Nebulization Once     Review of Systems  HENT:  Negative for hearing loss.   Eyes:  Negative for blurred vision.  Respiratory:  Negative for shortness of breath.   Cardiovascular:   Negative for chest pain.  Gastrointestinal: Negative.   Genitourinary: Negative.   Musculoskeletal:  Negative for back pain.  Neurological:  Negative for headaches.  Psychiatric/Behavioral:  Negative for depression.        Objective:     BP (!) 112/58   Pulse 70   Temp 97.8 F (36.6 C) (Oral)   Ht 5' 3.5 (1.613 m)   Wt 171 lb 4.8 oz (77.7 kg)   SpO2 94%   BMI 29.87 kg/m    Physical Exam Vitals reviewed.  Constitutional:      Appearance: Normal appearance. She is well-groomed. She is obese.  HENT:     Right Ear: Tympanic membrane and ear canal normal.     Left Ear: Tympanic membrane and ear canal normal.     Mouth/Throat:     Mouth: Mucous membranes are moist.     Pharynx: No posterior oropharyngeal erythema.  Eyes:     Conjunctiva/sclera: Conjunctivae normal.  Neck:     Thyroid: No thyromegaly.  Cardiovascular:     Rate and Rhythm: Normal rate and regular rhythm.     Pulses: Normal pulses.     Heart sounds: S1 normal and S2 normal.  Pulmonary:     Effort: Pulmonary effort is normal.     Breath sounds: Normal breath sounds and air entry.  Abdominal:     General: Abdomen is flat. Bowel sounds are normal.     Palpations: Abdomen is soft.  Musculoskeletal:     Right lower leg: No edema.     Left lower leg: No edema.  Lymphadenopathy:     Cervical: No cervical adenopathy.  Neurological:     Mental Status: She is alert and oriented to person, place, and time. Mental status is at baseline.     Gait: Gait is intact.  Psychiatric:        Mood and Affect: Mood and affect normal.        Speech: Speech normal.        Behavior: Behavior normal.        Judgment: Judgment normal.      No results found for any visits on 05/16/24.     Assessment & Plan:    Routine Health Maintenance and Physical Exam  Immunization History  Administered Date(s) Administered   DT (Pediatric) 07/27/2001   Fluzone Influenza virus vaccine,trivalent (IIV3), split virus 04/19/2020,  03/28/2021   INFLUENZA, HIGH DOSE SEASONAL PF 05/25/2017, 04/16/2018, 04/12/2019, 04/16/2023   Influenza-Unspecified 03/10/2024   Moderna Covid-19 Vaccine Bivalent Booster 84yrs & up 03/10/2024   PFIZER Comirnaty(Gray Top)Covid-19 Tri-Sucrose Vaccine 04/07/2022, 05/15/2023   PFIZER(Purple Top)SARS-COV-2 Vaccination 07/28/2019, 08/20/2019, 04/02/2020, 10/11/2020   PNEUMOCOCCAL CONJUGATE-20 05/16/2024   Pfizer Covid-19 Vaccine Bivalent Booster 76yrs & up 03/28/2021   Pneumococcal Conjugate-13 12/15/2013   Pneumococcal Polysaccharide-23 04/16/2007   Td 11/14/2019   Tdap 10/26/2011   Zoster, Live 10/26/2011, 04/16/2018, 07/06/2018    Health Maintenance  Topic Date Due   Zoster Vaccines- Shingrix  (1 of 2) 12/07/1958   COVID-19 Vaccine (9 - 2025-26 season) 05/05/2024   Medicare Annual Wellness (AWV)  08/12/2024   DTaP/Tdap/Td (4 - Td or Tdap) 11/13/2029   Pneumococcal Vaccine: 50+ Years  Completed   Influenza Vaccine  Completed   DEXA SCAN  Completed   Meningococcal B Vaccine  Aged Out    Discussed health benefits of physical activity, and encouraged her to engage in regular exercise appropriate for her age and condition.  Routine general medical examination at a health care facility  Immunization due -     Pneumococcal conjugate vaccine 20-valent  Postmenopausal state -     DG Bone Density; Future  Pure hypercholesterolemia -     Lipid panel; Future  Essential hypertension, benign -     Comprehensive metabolic panel with GFR; Future  Vitamin D deficiency -     VITAMIN D 25 Hydroxy (Vit-D Deficiency, Fractures); Future  Diabetes mellitus screening -     Hemoglobin A1c; Future  Breast cancer screening by mammogram -     3D Screening Mammogram, Left and Right; Future  General physical exam findings are normal today. I reviewed the patient's preventative testing, immunizations, and lifestyle habits. I made appropriate recommendations and placed orders for the appropriate  tests and/or vaccinations. I counseled the patient on the CDC's recommendations for healthy exercise and diet. I counseled the patient on healthy sleep habits and stress management. Handouts to reinforce the counseling were given at the conclusion of the visit.    Return in about 6 months (around 11/13/2024) for follow up.      Heron CHRISTELLA Sharper, MD

## 2024-05-27 ENCOUNTER — Ambulatory Visit: Payer: Self-pay | Admitting: Family Medicine

## 2024-06-17 ENCOUNTER — Other Ambulatory Visit: Payer: Self-pay | Admitting: Family Medicine

## 2024-06-17 DIAGNOSIS — F5104 Psychophysiologic insomnia: Secondary | ICD-10-CM

## 2024-06-19 ENCOUNTER — Ambulatory Visit

## 2024-06-23 ENCOUNTER — Encounter: Payer: Self-pay | Admitting: Family Medicine

## 2024-06-23 ENCOUNTER — Ambulatory Visit: Payer: Self-pay

## 2024-06-23 ENCOUNTER — Ambulatory Visit: Admitting: Family Medicine

## 2024-06-23 VITALS — BP 130/58 | HR 102 | Temp 98.1°F | Ht 63.5 in | Wt 170.1 lb

## 2024-06-23 DIAGNOSIS — J209 Acute bronchitis, unspecified: Secondary | ICD-10-CM

## 2024-06-23 DIAGNOSIS — R52 Pain, unspecified: Secondary | ICD-10-CM

## 2024-06-23 DIAGNOSIS — R059 Cough, unspecified: Secondary | ICD-10-CM

## 2024-06-23 DIAGNOSIS — L282 Other prurigo: Secondary | ICD-10-CM

## 2024-06-23 LAB — POC COVID19 BINAXNOW: SARS Coronavirus 2 Ag: NEGATIVE

## 2024-06-23 LAB — POCT INFLUENZA A/B
Influenza A, POC: NEGATIVE
Influenza B, POC: NEGATIVE

## 2024-06-23 MED ORDER — AZITHROMYCIN 250 MG PO TABS: ORAL_TABLET | ORAL | 0 refills | Status: AC

## 2024-06-23 MED ORDER — PREDNISONE 20 MG PO TABS: ORAL_TABLET | ORAL | 0 refills | Status: AC

## 2024-06-23 NOTE — Telephone Encounter (Signed)
 FYI Only or Action Required?: FYI only for provider: appointment scheduled on 06/23/2024.  Patient was last seen in primary care on 05/16/2024 by Ozell Heron HERO, MD.  Called Nurse Triage reporting Cough, Nasal Congestion, and Wheezing.  Symptoms began several days ago.  Interventions attempted: Nothing.  Symptoms are: gradually worsening.  Triage Disposition: See HCP Within 4 Hours (Or PCP Triage)  Patient/caregiver understands and will follow disposition?: Yes  Copied from CRM [id number]. Topic: Clinical - Red Word Triage >> Jun 23, 2024  9:17 AM [name] wrote: Red Word that prompted transfer to Nurse Triage: fever 99.9 normally lower, wheezing Reason for Disposition  Wheezing is present  Answer Assessment - Initial Assessment Questions 1. ONSET: When did the cough begin?      Saturday, two days ago  3. SPUTUM: Describe the color of your sputum (e.g., none, dry cough; clear, white, yellow, green)     denies  5. DIFFICULTY BREATHING: Are you having difficulty breathing? If Yes, ask: How bad is it? (e.g., mild, moderate, severe)      Nasal congestion 6. FEVER: Do you have a fever? If Yes, ask: What is your temperature, how was it measured, and when did it start?     99.9 7. CARDIAC HISTORY: Do you have any history of heart disease? (e.g., heart attack, congestive heart failure)      HTN 8. LUNG HISTORY: Do you have any history of lung disease?  (e.g., pulmonary embolus, asthma, emphysema)     OSA 10. OTHER SYMPTOMS: Do you have any other symptoms? (e.g., runny nose, wheezing, chest pain)       Nasal congestion, wheezing  Protocols used: Cough - Acute Non-Productive-A-AH

## 2024-06-23 NOTE — Telephone Encounter (Signed)
Appt today at 3:40 pm

## 2024-06-23 NOTE — Progress Notes (Signed)
 Acute Office Visit  Subjective:     Patient ID: [name], female    DOB: [birth year], 83 y.o.   MRN: [id number]  Chief Complaint  Patient presents with   Cough    Productive cough-color unknown x3 days, returned from recent trip to [place] visiting family and exposed to young grandsons   Wheezing    X1 day, used an inhaler she had from last year with relief   Fatigue   Generalized Body Aches    Cough Associated symptoms include wheezing.  Wheezing  Associated symptoms include coughing.   Discussed the use of AI scribe software for clinical note transcription with the patient, who gave verbal consent to proceed.  History of Present Illness   [name] is an 83 year old female who presents with symptoms of bronchitis.  She began feeling unwell on Friday after visiting family, with persistent cough, wheezing, body aches, profound fatigue, sore throat, and shortness of breath related to nasal congestion. She notes minimal mucus production.  She is using an inhaler from last year, which improves the wheezing.  She had tuberculosis at age 49 and reports receiving her flu shot, COVID booster, and pneumonia vaccine.  She recently developed intense generalized itching about 30 minutes after eating mussels, without rash, despite prior tolerance of shellfish.  She lives alone and drove herself to the visit despite feeling unwell.       Review of Systems  Respiratory:  Positive for cough and wheezing.         Objective:    BP (!) 130/58   Pulse (!) 102   Temp 98.1 F (36.7 C) (Oral)   Ht 5' 3.5 (1.613 m)   Wt 170 lb 1.6 oz (77.2 kg)   SpO2 93%   BMI 29.66 kg/m    Physical Exam Vitals reviewed.  Constitutional:      Appearance: Normal appearance. She is normal weight.  HENT:     Right Ear: Tympanic membrane normal.     Left Ear: Tympanic membrane normal.     Nose: Congestion present.     Mouth/Throat:     Mouth: Mucous membranes are moist.     Pharynx:  No posterior oropharyngeal erythema.  Eyes:     Conjunctiva/sclera: Conjunctivae normal.  Cardiovascular:     Rate and Rhythm: Normal rate and regular rhythm.     Heart sounds: Normal heart sounds. No murmur heard. Pulmonary:     Effort: Pulmonary effort is normal.     Breath sounds: Wheezing (severe diffuse expiratory wheezing in all lung fields) present.  Lymphadenopathy:     Cervical: No cervical adenopathy.  Neurological:     Mental Status: She is alert and oriented to person, place, and time. Mental status is at baseline.  Psychiatric:        Mood and Affect: Mood normal.        Behavior: Behavior normal.     No results found for any visits on 06/23/24.      Assessment & Plan:   Problem List Items Addressed This Visit   None Visit Diagnoses       Cough, unspecified type    -  Primary   Relevant Orders   POC COVID-19   POC Influenza A/B     Generalized body aches       Relevant Orders   POC COVID-19   POC Influenza A/B     Acute bronchitis, unspecified organism       Relevant  Medications   predniSONE  (DELTASONE ) 20 MG tablet   azithromycin  (ZITHROMAX ) 250 MG tablet     Assessment and Plan    Acute bronchitis Symptoms of coughing, wheezing, body aches, profound fatigue, and congestion. Negative COVID and flu tests. Wheezing present, no pneumonia detected. Differential includes viral infection given fatigue and body aches. Tuberculosis history noted. Decision to treat with steroids and inhaler, with antibiotics as a secondary option if no improvement. - Continue inhaler use, two puffs every four to six hours as needed. - Prescribed prednisone  to reduce airway inflammation. - Prescribed azithromycin  (Z-Pak) as a secondary option if no improvement with initial treatment. - Advised to monitor for worsening symptoms such as chest pain, new fevers, or chills, and to seek further evaluation if these occur.  Shellfish allergy Recent episode of severe itching after  consuming mussels, suggestive of a shellfish allergy. No rash reported. Allergy added to medical record. - Ordered shellfish allergy blood test to confirm allergy. - Advised to avoid shellfish until allergy test is completed.        Meds ordered this encounter  Medications   predniSONE  (DELTASONE ) 20 MG tablet    Sig: Take 3 tablets (60 mg total) by mouth daily with breakfast for 2 days, THEN 2 tablets (40 mg total) daily with breakfast for 2 days, THEN 1 tablet (20 mg total) daily with breakfast for 2 days, THEN 0.5 tablets (10 mg total) daily with breakfast for 2 days.    Dispense:  13 tablet    Refill:  0   azithromycin  (ZITHROMAX ) 250 MG tablet    Sig: Take 2 tablets on day 1, then 1 tablet daily on days 2 through 5    Dispense:  6 tablet    Refill:  0    No follow-ups on file.  Heron CHRISTELLA Sharper, MD

## 2024-06-24 ENCOUNTER — Telehealth: Payer: Self-pay | Admitting: Family Medicine

## 2024-06-24 DIAGNOSIS — J209 Acute bronchitis, unspecified: Secondary | ICD-10-CM

## 2024-06-24 MED ORDER — ALBUTEROL SULFATE HFA 108 (90 BASE) MCG/ACT IN AERS: 2.0000 | INHALATION_SPRAY | Freq: Four times a day (QID) | RESPIRATORY_TRACT | 2 refills | Status: AC | PRN

## 2024-06-24 NOTE — Telephone Encounter (Signed)
 Script sent

## 2024-06-24 NOTE — Telephone Encounter (Unsigned)
 Copied from CRM [id number]. Topic: Clinical - Medication Refill >> Jun 24, 2024  8:46 AM Burnard DEL wrote: Medication: albuterol  inhaler (not on med list,stated she was prescribed one a year ago)  Has the patient contacted their pharmacy? No (Agent: If no, request that the patient contact the pharmacy for the refill. If patient does not wish to contact the pharmacy document the reason why and proceed with request.) (Agent: If yes, when and what did the pharmacy advise?)  This is the patient's preferred pharmacy:  [facility] [id number] - [place], [place] - [address] [place] [address] Phone: [phone] Fax: [phone]  Is this the correct pharmacy for this prescription? Yes If no, delete pharmacy and type the correct one.   Has the prescription been filled recently? No  Is the patient out of the medication? Yes  Has the patient been seen for an appointment in the last year OR does the patient have an upcoming appointment? Yes  Can we respond through MyChart? Yes  Agent: Please be advised that Rx refills may take up to 3 business days. We ask that you follow-up with your pharmacy.

## 2024-06-24 NOTE — Telephone Encounter (Signed)
 Albuterol  inhaler is not on current med list.

## 2024-06-25 ENCOUNTER — Ambulatory Visit

## 2024-07-05 ENCOUNTER — Emergency Department (HOSPITAL_BASED_OUTPATIENT_CLINIC_OR_DEPARTMENT_OTHER): Admitting: Radiology

## 2024-07-05 ENCOUNTER — Encounter (HOSPITAL_BASED_OUTPATIENT_CLINIC_OR_DEPARTMENT_OTHER): Payer: Self-pay | Admitting: *Deleted

## 2024-07-05 ENCOUNTER — Other Ambulatory Visit: Payer: Self-pay

## 2024-07-05 ENCOUNTER — Emergency Department (HOSPITAL_BASED_OUTPATIENT_CLINIC_OR_DEPARTMENT_OTHER)

## 2024-07-05 ENCOUNTER — Inpatient Hospital Stay (HOSPITAL_BASED_OUTPATIENT_CLINIC_OR_DEPARTMENT_OTHER)
Admission: EM | Admit: 2024-07-05 | Discharge: 2024-07-09 | DRG: 189 | Disposition: A | Discharge status: Home / Self Care | Source: Ambulatory Visit | Attending: Internal Medicine | Admitting: Internal Medicine

## 2024-07-05 DIAGNOSIS — K219 Gastro-esophageal reflux disease without esophagitis: Secondary | ICD-10-CM | POA: Diagnosis present

## 2024-07-05 DIAGNOSIS — M797 Fibromyalgia: Secondary | ICD-10-CM

## 2024-07-05 DIAGNOSIS — Z85828 Personal history of other malignant neoplasm of skin: Secondary | ICD-10-CM

## 2024-07-05 DIAGNOSIS — Z823 Family history of stroke: Secondary | ICD-10-CM

## 2024-07-05 DIAGNOSIS — J40 Bronchitis, not specified as acute or chronic: Principal | ICD-10-CM

## 2024-07-05 DIAGNOSIS — Z96653 Presence of artificial knee joint, bilateral: Secondary | ICD-10-CM | POA: Diagnosis present

## 2024-07-05 DIAGNOSIS — Z6830 Body mass index (BMI) 30.0-30.9, adult: Secondary | ICD-10-CM

## 2024-07-05 DIAGNOSIS — G4733 Obstructive sleep apnea (adult) (pediatric): Secondary | ICD-10-CM | POA: Diagnosis present

## 2024-07-05 DIAGNOSIS — E7800 Pure hypercholesterolemia, unspecified: Secondary | ICD-10-CM | POA: Diagnosis present

## 2024-07-05 DIAGNOSIS — J206 Acute bronchitis due to rhinovirus: Secondary | ICD-10-CM | POA: Diagnosis present

## 2024-07-05 DIAGNOSIS — E66811 Obesity, class 1: Secondary | ICD-10-CM | POA: Diagnosis present

## 2024-07-05 DIAGNOSIS — G4700 Insomnia, unspecified: Secondary | ICD-10-CM | POA: Diagnosis present

## 2024-07-05 DIAGNOSIS — Z91199 Patient's noncompliance with other medical treatment and regimen due to unspecified reason: Secondary | ICD-10-CM

## 2024-07-05 DIAGNOSIS — Z881 Allergy status to other antibiotic agents status: Secondary | ICD-10-CM

## 2024-07-05 DIAGNOSIS — Z8701 Personal history of pneumonia (recurrent): Secondary | ICD-10-CM

## 2024-07-05 DIAGNOSIS — R197 Diarrhea, unspecified: Secondary | ICD-10-CM | POA: Diagnosis present

## 2024-07-05 DIAGNOSIS — I10 Essential (primary) hypertension: Secondary | ICD-10-CM | POA: Diagnosis not present

## 2024-07-05 DIAGNOSIS — M1711 Unilateral primary osteoarthritis, right knee: Secondary | ICD-10-CM

## 2024-07-05 DIAGNOSIS — B348 Other viral infections of unspecified site: Secondary | ICD-10-CM | POA: Diagnosis not present

## 2024-07-05 DIAGNOSIS — Z91013 Allergy to seafood: Secondary | ICD-10-CM

## 2024-07-05 DIAGNOSIS — R32 Unspecified urinary incontinence: Secondary | ICD-10-CM | POA: Diagnosis present

## 2024-07-05 DIAGNOSIS — Z882 Allergy status to sulfonamides status: Secondary | ICD-10-CM

## 2024-07-05 DIAGNOSIS — G250 Essential tremor: Secondary | ICD-10-CM

## 2024-07-05 DIAGNOSIS — Z8249 Family history of ischemic heart disease and other diseases of the circulatory system: Secondary | ICD-10-CM

## 2024-07-05 DIAGNOSIS — I129 Hypertensive chronic kidney disease with stage 1 through stage 4 chronic kidney disease, or unspecified chronic kidney disease: Secondary | ICD-10-CM | POA: Diagnosis present

## 2024-07-05 DIAGNOSIS — T380X5A Adverse effect of glucocorticoids and synthetic analogues, initial encounter: Secondary | ICD-10-CM | POA: Diagnosis not present

## 2024-07-05 DIAGNOSIS — Z5320 Procedure and treatment not carried out because of patient's decision for unspecified reasons: Secondary | ICD-10-CM | POA: Diagnosis not present

## 2024-07-05 DIAGNOSIS — F5101 Primary insomnia: Secondary | ICD-10-CM | POA: Diagnosis present

## 2024-07-05 DIAGNOSIS — Z79899 Other long term (current) drug therapy: Secondary | ICD-10-CM

## 2024-07-05 DIAGNOSIS — R531 Weakness: Secondary | ICD-10-CM

## 2024-07-05 DIAGNOSIS — Z888 Allergy status to other drugs, medicaments and biological substances status: Secondary | ICD-10-CM

## 2024-07-05 DIAGNOSIS — F5104 Psychophysiologic insomnia: Secondary | ICD-10-CM

## 2024-07-05 DIAGNOSIS — M8580 Other specified disorders of bone density and structure, unspecified site: Secondary | ICD-10-CM | POA: Diagnosis present

## 2024-07-05 DIAGNOSIS — E871 Hypo-osmolality and hyponatremia: Secondary | ICD-10-CM | POA: Diagnosis present

## 2024-07-05 DIAGNOSIS — B9710 Unspecified enterovirus as the cause of diseases classified elsewhere: Secondary | ICD-10-CM | POA: Diagnosis present

## 2024-07-05 DIAGNOSIS — Z1152 Encounter for screening for COVID-19: Secondary | ICD-10-CM

## 2024-07-05 DIAGNOSIS — N189 Chronic kidney disease, unspecified: Secondary | ICD-10-CM | POA: Diagnosis present

## 2024-07-05 DIAGNOSIS — R0602 Shortness of breath: Secondary | ICD-10-CM | POA: Diagnosis present

## 2024-07-05 DIAGNOSIS — J9601 Acute respiratory failure with hypoxia: Secondary | ICD-10-CM | POA: Diagnosis not present

## 2024-07-05 DIAGNOSIS — F064 Anxiety disorder due to known physiological condition: Secondary | ICD-10-CM | POA: Diagnosis not present

## 2024-07-05 DIAGNOSIS — Z8042 Family history of malignant neoplasm of prostate: Secondary | ICD-10-CM

## 2024-07-05 LAB — BASIC METABOLIC PANEL WITH GFR
Anion gap: 11 (ref 5–15)
BUN: 11 mg/dL (ref 8–23)
CO2: 28 mmol/L (ref 22–32)
Calcium: 9.3 mg/dL (ref 8.9–10.3)
Chloride: 95 mmol/L — ABNORMAL LOW (ref 98–111)
Creatinine, Ser: 0.94 mg/dL (ref 0.44–1.00)
GFR, Estimated: 60 mL/min — ABNORMAL LOW
Glucose, Bld: 120 mg/dL — ABNORMAL HIGH (ref 70–99)
Potassium: 3.8 mmol/L (ref 3.5–5.1)
Sodium: 134 mmol/L — ABNORMAL LOW (ref 135–145)

## 2024-07-05 LAB — RESPIRATORY PANEL BY PCR

## 2024-07-05 LAB — CBC
HCT: 35.9 % — ABNORMAL LOW (ref 36.0–46.0)
Hemoglobin: 12.3 g/dL (ref 12.0–15.0)
MCH: 33.5 pg (ref 26.0–34.0)
MCHC: 34.3 g/dL (ref 30.0–36.0)
MCV: 97.8 fL (ref 80.0–100.0)
Platelets: 194 K/uL (ref 150–400)
RBC: 3.67 MIL/uL — ABNORMAL LOW (ref 3.87–5.11)
RDW: 13.3 % (ref 11.5–15.5)
WBC: 11.7 K/uL — ABNORMAL HIGH (ref 4.0–10.5)
nRBC: 0 % (ref 0.0–0.2)

## 2024-07-05 LAB — RESP PANEL BY RT-PCR (RSV, FLU A&B, COVID)  RVPGX2
Influenza A by PCR: NEGATIVE
Influenza B by PCR: NEGATIVE
Resp Syncytial Virus by PCR: NEGATIVE
SARS Coronavirus 2 by RT PCR: NEGATIVE

## 2024-07-05 LAB — D-DIMER, QUANTITATIVE: D-Dimer, Quant: 0.79 ug{FEU}/mL — ABNORMAL HIGH (ref 0.00–0.50)

## 2024-07-05 LAB — PRO BRAIN NATRIURETIC PEPTIDE: Pro Brain Natriuretic Peptide: 395 pg/mL — ABNORMAL HIGH

## 2024-07-05 LAB — PROCALCITONIN: Procalcitonin: <0.1 ng/mL

## 2024-07-05 MED ORDER — SENNOSIDES-DOCUSATE SODIUM 8.6-50 MG PO TABS
1.0000 | ORAL_TABLET | Freq: Every evening | ORAL | Status: DC | PRN
  Filled 2024-07-05: qty 1

## 2024-07-05 MED ORDER — SODIUM CHLORIDE 0.9 % IV SOLN: INTRAVENOUS | Status: AC

## 2024-07-05 MED ORDER — ACETAMINOPHEN 325 MG PO TABS
650.0000 mg | ORAL_TABLET | Freq: Four times a day (QID) | ORAL | Status: DC | PRN
  Administered 2024-07-06 (×2): 650 mg via ORAL
  Filled 2024-07-05 (×4): qty 2

## 2024-07-05 MED ORDER — TRAZODONE HCL 50 MG PO TABS
50.0000 mg | ORAL_TABLET | Freq: Every evening | ORAL | Status: DC | PRN
  Administered 2024-07-05 – 2024-07-07 (×3): 50 mg via ORAL
  Filled 2024-07-05 (×5): qty 1

## 2024-07-05 MED ORDER — IPRATROPIUM-ALBUTEROL 0.5-2.5 (3) MG/3ML IN SOLN
3.0000 mL | Freq: Four times a day (QID) | RESPIRATORY_TRACT | Status: DC | PRN
  Administered 2024-07-05: 3 mL via RESPIRATORY_TRACT
  Filled 2024-07-05 (×2): qty 3

## 2024-07-05 MED ORDER — ENOXAPARIN SODIUM 40 MG/0.4ML IJ SOSY
40.0000 mg | PREFILLED_SYRINGE | INTRAMUSCULAR | Status: DC
  Administered 2024-07-05 – 2024-07-06 (×2): 40 mg via SUBCUTANEOUS
  Filled 2024-07-05 (×2): qty 0.4

## 2024-07-05 MED ORDER — PREDNISONE 20 MG PO TABS
40.0000 mg | ORAL_TABLET | Freq: Every day | ORAL | Status: DC
  Administered 2024-07-06 – 2024-07-08 (×3): 40 mg via ORAL
  Filled 2024-07-05 (×4): qty 2

## 2024-07-05 MED ORDER — IOHEXOL 350 MG/ML SOLN
75.0000 mL | Freq: Once | INTRAVENOUS | Status: AC | PRN
  Administered 2024-07-05: 75 mL via INTRAVENOUS

## 2024-07-05 MED ORDER — ONDANSETRON HCL 4 MG/2ML IJ SOLN: 4.0000 mg | Freq: Four times a day (QID) | INTRAMUSCULAR | Status: DC | PRN

## 2024-07-05 MED ORDER — POLYVINYL ALCOHOL 1.4 % OP SOLN
1.0000 [drp] | OPHTHALMIC | Status: DC | PRN
  Filled 2024-07-05 (×2): qty 15

## 2024-07-05 MED ORDER — DEXAMETHASONE SOD PHOSPHATE PF 10 MG/ML IJ SOLN
10.0000 mg | Freq: Once | INTRAMUSCULAR | Status: AC
  Administered 2024-07-05: 10 mg via INTRAVENOUS

## 2024-07-05 MED ORDER — GUAIFENESIN 100 MG/5ML PO LIQD
5.0000 mL | ORAL | Status: DC | PRN
  Administered 2024-07-05 – 2024-07-08 (×6): 5 mL via ORAL
  Filled 2024-07-05 (×3): qty 10
  Filled 2024-07-05: qty 5
  Filled 2024-07-05 (×4): qty 10

## 2024-07-05 MED ORDER — AMLODIPINE BESYLATE 5 MG PO TABS
5.0000 mg | ORAL_TABLET | Freq: Every day | ORAL | Status: DC
  Administered 2024-07-06 – 2024-07-09 (×4): 5 mg via ORAL
  Filled 2024-07-05 (×4): qty 1

## 2024-07-05 MED ORDER — IPRATROPIUM-ALBUTEROL 0.5-2.5 (3) MG/3ML IN SOLN
6.0000 mL | RESPIRATORY_TRACT | Status: AC
  Administered 2024-07-05: 6 mL via RESPIRATORY_TRACT
  Filled 2024-07-05: qty 3

## 2024-07-05 MED ORDER — PHENOL 1.4 % MT LIQD
1.0000 | OROMUCOSAL | Status: DC | PRN
  Administered 2024-07-05 – 2024-07-06 (×2): 1 via OROMUCOSAL
  Filled 2024-07-05 (×3): qty 177

## 2024-07-05 MED ORDER — ONDANSETRON HCL 4 MG PO TABS
4.0000 mg | ORAL_TABLET | Freq: Four times a day (QID) | ORAL | Status: DC | PRN
  Filled 2024-07-05: qty 1

## 2024-07-05 MED ORDER — PREGABALIN 75 MG PO CAPS
75.0000 mg | ORAL_CAPSULE | Freq: Two times a day (BID) | ORAL | Status: DC
  Administered 2024-07-05 – 2024-07-09 (×7): 75 mg via ORAL
  Filled 2024-07-05 (×6): qty 1
  Filled 2024-07-05: qty 4

## 2024-07-05 MED ORDER — ACETAMINOPHEN 650 MG RE SUPP: 650.0000 mg | Freq: Four times a day (QID) | RECTAL | Status: DC | PRN

## 2024-07-05 MED ORDER — PRIMIDONE 50 MG PO TABS
50.0000 mg | ORAL_TABLET | Freq: Two times a day (BID) | ORAL | Status: DC
  Administered 2024-07-05 – 2024-07-09 (×8): 50 mg via ORAL
  Filled 2024-07-05 (×8): qty 1

## 2024-07-05 MED ORDER — IPRATROPIUM-ALBUTEROL 0.5-2.5 (3) MG/3ML IN SOLN
3.0000 mL | Freq: Once | RESPIRATORY_TRACT | Status: AC
  Administered 2024-07-05: 3 mL via RESPIRATORY_TRACT
  Filled 2024-07-05: qty 3

## 2024-07-05 MED ORDER — ATENOLOL 25 MG PO TABS
25.0000 mg | ORAL_TABLET | Freq: Every day | ORAL | Status: DC
  Administered 2024-07-05 – 2024-07-07 (×3): 25 mg via ORAL
  Filled 2024-07-05 (×6): qty 1

## 2024-07-05 MED ORDER — TRAMADOL HCL 50 MG PO TABS
50.0000 mg | ORAL_TABLET | Freq: Three times a day (TID) | ORAL | Status: DC | PRN
  Administered 2024-07-08 (×2): 50 mg via ORAL
  Filled 2024-07-05: qty 1

## 2024-07-05 MED ORDER — METHYLPREDNISOLONE SODIUM SUCC 125 MG IJ SOLR
125.0000 mg | INTRAMUSCULAR | Status: AC
  Administered 2024-07-05: 125 mg via INTRAVENOUS
  Filled 2024-07-05: qty 2

## 2024-07-05 MED ORDER — CYCLOBENZAPRINE HCL 10 MG PO TABS
10.0000 mg | ORAL_TABLET | Freq: Every day | ORAL | Status: DC
  Administered 2024-07-05 – 2024-07-08 (×4): 10 mg via ORAL
  Filled 2024-07-05 (×4): qty 1

## 2024-07-05 MED ORDER — ALBUTEROL SULFATE (2.5 MG/3ML) 0.083% IN NEBU
2.5000 mg | INHALATION_SOLUTION | Freq: Three times a day (TID) | RESPIRATORY_TRACT | Status: DC
  Administered 2024-07-06 – 2024-07-09 (×9): 2.5 mg via RESPIRATORY_TRACT
  Filled 2024-07-05 (×11): qty 3

## 2024-07-05 MED ORDER — SIMVASTATIN 20 MG PO TABS
40.0000 mg | ORAL_TABLET | Freq: Every day | ORAL | Status: DC
  Administered 2024-07-05 – 2024-07-08 (×4): 40 mg via ORAL
  Filled 2024-07-05 (×4): qty 2

## 2024-07-05 MED ORDER — BISACODYL 5 MG PO TBEC
5.0000 mg | DELAYED_RELEASE_TABLET | Freq: Every day | ORAL | Status: DC | PRN
  Filled 2024-07-05: qty 1

## 2024-07-05 NOTE — ED Provider Notes (Signed)
 " [place] EMERGENCY DEPARTMENT AT [facility] Provider Note   CSN: [id number] Arrival date & time: 07/05/24  0930     Patient presents with: Shortness of Breath and Cough   [name] is a 83 y.o. female with previous history of squamous cell carcinoma of the skin, hypertension, DVT, presents with a concern for ongoing productive cough and shortness of breath for the past 2 weeks.  She reports that she was initially treated with a course of azithromycin  and prednisone .  This seemed to improve her symptoms, but then symptoms returned after she finished these medications.  She reports that her oxygen level was low at home which prompted her to seek care in the emergency room today.  She denies fever or chills.  Denies any new leg pain or swelling.  She is not on any anticoagulation.    Shortness of Breath Associated symptoms: cough   Cough Associated symptoms: shortness of breath        Prior to Admission medications  Medication Sig Start Date End Date Taking? Authorizing Provider  albuterol  (VENTOLIN  HFA) 108 (90 Base) MCG/ACT inhaler Inhale 2 puffs into the lungs every 6 (six) hours as needed for wheezing or shortness of breath. 06/24/24   Ozell Heron HERO, MD  amLODipine  (NORVASC ) 5 MG tablet TAKE 1 TABLET BY MOUTH DAILY 04/24/24   Ozell Heron HERO, MD  atenolol  (TENORMIN ) 25 MG tablet TAKE 1 TABLET BY MOUTH DAILY 05/05/24   Ozell Heron HERO, MD  cetirizine (ZYRTEC) 10 MG tablet Take 10 mg by mouth daily. Patient taking differently: Take 10 mg by mouth as needed.    [provider]  cyclobenzaprine  (FLEXERIL ) 10 MG tablet TAKE 1 TABLET BY MOUTH AT BEDTIME 05/05/24   Ozell Heron HERO, MD  hydroxypropyl methylcellulose / hypromellose (ISOPTO TEARS / GONIOVISC) 2.5 % ophthalmic solution Place 1 drop into both eyes as needed for dry eyes.    [provider]  pregabalin  (LYRICA ) 75 MG capsule TAKE 1 CAPSULE BY MOUTH 2 TIMES A DAY 04/28/24   Ozell Heron HERO, MD  primidone  (MYSOLINE ) 50 MG tablet TAKE 1 TABLET BY MOUTH TWICE A DAY 08/10/23   Ozell Heron HERO, MD  simvastatin  (ZOCOR ) 40 MG tablet Take 1 tablet (40 mg total) by mouth at bedtime. 08/26/15   Dann Candyce RAMAN, MD  traMADol  (ULTRAM ) 50 MG tablet Take 1 tablet (50 mg total) by mouth every 8 (eight) hours as needed. 03/20/24   Ozell Heron HERO, MD  traZODone  (DESYREL ) 50 MG tablet TAKE 1 TABLET BY MOUTH EVERY NIGHT AT BEDTIME AS NEEDED FOR SLEEP 06/17/24   Ozell Heron HERO, MD    Allergies: Propoxyphene, Shellfish allergy, Erythromycin base, Hctz [hydrochlorothiazide], Lisinopril, and Sulfa antibiotics    Review of Systems  Respiratory:  Positive for cough and shortness of breath.     Updated Vital Signs BP 129/64   Pulse 90   Temp 98.2 F (36.8 C) (Axillary)   Resp 18   SpO2 96%   Physical Exam Vitals and nursing note reviewed.  Constitutional:      General: She is not in acute distress.    Appearance: She is well-developed.  HENT:     Head: Normocephalic and atraumatic.  Eyes:     Conjunctiva/sclera: Conjunctivae normal.  Cardiovascular:     Rate and Rhythm: Normal rate and regular rhythm.     Heart sounds: No murmur heard.    Comments: 2+ radial and pedal pulses bilaterally Pulmonary:  Effort: Pulmonary effort is normal. No respiratory distress.     Breath sounds: Rhonchi present.     Comments: Talking in full sentences with 3 L nasal cannula.  Crackles bilaterally and some mild wheezing on exam.  Wet sounding cough on exam Abdominal:     Palpations: Abdomen is soft.     Tenderness: There is no abdominal tenderness.  Musculoskeletal:        General: No swelling.     Cervical back: Neck supple.     Comments: Nonpitting edema of the bilateral lower extremities.  Patient reports this is baseline.  No calf tenderness bilaterally  Skin:    General: Skin is warm and dry.     Capillary Refill: Capillary refill takes less than 2 seconds.   Neurological:     Mental Status: She is alert.  Psychiatric:        Mood and Affect: Mood normal.     (all labs ordered are listed, but only abnormal results are displayed) Labs Reviewed  BASIC METABOLIC PANEL WITH GFR - Abnormal; Notable for the following components:      Result Value   Sodium 134 (*)    Chloride 95 (*)    Glucose, Bld 120 (*)    GFR, Estimated 60 (*)    All other components within normal limits  CBC - Abnormal; Notable for the following components:   WBC 11.7 (*)    RBC 3.67 (*)    HCT 35.9 (*)    All other components within normal limits  D-DIMER, QUANTITATIVE - Abnormal; Notable for the following components:   D-Dimer, Quant 0.79 (*)    All other components within normal limits  PRO BRAIN NATRIURETIC PEPTIDE - Abnormal; Notable for the following components:   Pro Brain Natriuretic Peptide 395.0 (*)    All other components within normal limits  RESP PANEL BY RT-PCR (RSV, FLU A&B, COVID)  RVPGX2  RESPIRATORY PANEL BY PCR  EXPECTORATED SPUTUM ASSESSMENT W GRAM STAIN, RFLX TO RESP C  CULTURE, BLOOD (ROUTINE X 2)  CULTURE, BLOOD (ROUTINE X 2)  PROCALCITONIN    EKG: None  Radiology: CT Angio Chest PE W and/or Wo Contrast Result Date: 07/05/2024 EXAM: CTA CHEST 07/05/2024 12:49:26 PM TECHNIQUE: CTA of the chest was performed without and with the administration of 75 mL of intravenous iohexol  (OMNIPAQUE ) 350 MG/ML injection. Multiplanar reformatted images are provided for review. MIP images are provided for review. Automated exposure control, iterative reconstruction, and/or weight based adjustment of the mA/kV was utilized to reduce the radiation dose to as low as reasonably achievable. COMPARISON: CT angio chest 03/16/23, CT abdomen and pelvis 01/22/2021 CLINICAL HISTORY: Pulmonary embolism (PE) suspected, low to intermediate prob, positive D-dimer; Shortness of breath and new oxygen requirement. Also with cough, eval for pneumonia. FINDINGS: PULMONARY  ARTERIES: Pulmonary arteries are adequately opacified for evaluation. No acute pulmonary embolus. Main pulmonary artery is normal in caliber. MEDIASTINUM: The heart demonstrates at least 3-vessel coronary artery calcifications. The pericardium demonstrates no acute abnormality. There is severe atherosclerotic plaque of the thoracic aorta. LYMPH NODES: No mediastinal, hilar or axillary lymphadenopathy. LUNGS AND PLEURA: Diffuse mild bronchial wall thickening. No focal consolidation or pulmonary edema. No evidence of pleural effusion or pneumothorax. UPPER ABDOMEN: Subcentimeter calcifications within the hepatic parenchyma are nonspecific. SOFT TISSUES AND BONES: Multilevel moderate degenerative changes of the spine. Sclerotic lesion within the T3 vertebral body, likely a bone island. No acute soft tissue abnormality. IMPRESSION: 1. No pulmonary embolism. 2. No acute pulmonary abnormality.  3. Severe atherosclerotic plaque with at least 3-vessel coronary artery calcifications. Electronically signed by: Morgane Naveau MD 07/05/2024 01:55 PM EST RP Workstation: [id number]   DG Chest 2 View Result Date: 07/05/2024 CLINICAL DATA:  Shortness of breath. EXAM: CHEST - 2 VIEW COMPARISON:  01/01/2024 FINDINGS: The heart size and mediastinal contours are within normal limits. Both lungs are clear. The visualized skeletal structures are unremarkable. IMPRESSION: No active cardiopulmonary disease. Electronically Signed   By: Camellia Candle M.D.   On: 07/05/2024 11:29     Procedures   Medications Ordered in the ED  ipratropium-albuterol  (DUONEB) 0.5-2.5 (3) MG/3ML nebulizer solution 3 mL (3 mLs Nebulization Given 07/05/24 1024)  dexamethasone  (DECADRON ) injection 10 mg (10 mg Intravenous Given 07/05/24 1050)  iohexol  (OMNIPAQUE ) 350 MG/ML injection 75 mL (75 mLs Intravenous Contrast Given 07/05/24 1242)  methylPREDNISolone  sodium succinate (SOLU-MEDROL ) 125 mg/2 mL injection 125 mg (125 mg Intravenous Given 07/05/24  1515)  ipratropium-albuterol  (DUONEB) 0.5-2.5 (3) MG/3ML nebulizer solution 6 mL (6 mLs Nebulization Given 07/05/24 1502)    Clinical Course as of 07/05/24 1624  Sat Jul 05, 2024  1510 Consulted with hospitalist Dr. Sherrill, recommends admission.  He request that blood cultures and BNP be added on.  These have been ordered. [AF]    Clinical Course User Index [AF] Veta Palma, PA-C                                 Medical Decision Making Amount and/or Complexity of Data Reviewed Labs: ordered. Radiology: ordered.  Risk Prescription drug management. Decision regarding hospitalization.     Differential diagnosis includes but is not limited to CHF exacerbation, DVT, PE, pleural effusion, COVID, flu, RSV, viral URI, strep pharyngitis, viral pharyngitis, allergic rhinitis, pneumonia, bronchitis   ED Course:  Upon initial evaluation, patient is well-appearing, no acute distress.  Hypoxic on room air upon arrival at 89%, this improved to 96% with 3 L nasal cannula.  She has diffuse crackles in the lung fields bilaterally.  Also with mild wheezing.  Will give DuoNeb and Decadron .  She does have a wet sounding cough on exam and does cough up some sputum.  Labs Ordered: I Ordered, and personally interpreted labs.  The pertinent results include:   CBC with leukocytosis 11.7 BMP with slight hyponatremia at 134, low chloride at 95.  D-dimer elevated at 0.79, but within normal limits for age Procalcitonin within normal limits Blood cultures and sputum sample pending Full respiratory panel pending COVID, flu, RSV negative proBNP elevated at 395  Imaging Studies ordered: I ordered imaging studies including chest x-ray, CTA chest I independently visualized the imaging with scope of interpretation limited to determining acute life threatening conditions related to emergency care. Imaging showed   Chest x-ray without acute abnormality  CTA chest: IMPRESSION:  1. No pulmonary  embolism.  2. No acute pulmonary abnormality.  3. Severe atherosclerotic plaque with at least 3-vessel coronary artery  calcifications.   I agree with the radiologist interpretation   Cardiac Monitoring: / EKG: The patient was maintained on a cardiac monitor.  I personally viewed and interpreted the cardiac monitored which showed an underlying rhythm of: Normal sinus rhythm on cardiac monitor   Consultations Obtained: I requested consultation with the hospitalist Dr. Sherrill,  and discussed lab and imaging findings as well as pertinent plan - they recommend: Admission  Medications Given: Decadron  Methylprednisolone  DuoNeb  Upon re-evaluation, patient remains well-appearing with stable vitals.  Still has some crackles on exam even with first DuoNeb treatment and Decadron .  Still requiring 3 L nasal cannula.  She normally does not require oxygen at home.  Given new oxygen requirement, she will need admission. Low concern for CHF given proBNP not significantly elevated, no signs of pulmonary edema on chest x-ray or CT scan of the chest.  No history of heart failure.  Her COVID, flu, RSV testing is negative.  No consolidations on her imaging, low concern for pneumonia.  CTA chest without evidence of pulmonary embolism.  Do not think this is cardiac in nature given she has associated cough and URI type symptoms. Suspect bronchitis as the cause of her symptoms given the ongoing nature for the past 2 weeks.  Attending Dr. Jakie also evaluated patient and recommended adding on another DuoNeb treatment and methylprednisolone  given ongoing crackles.  He recommends admission for the patient for bronchitis  Impression: Bronchitis  Disposition:  Admission with hospitalist Dr. Sherrill   This chart was dictated using voice recognition software, Dragon. Despite the best efforts of this provider to proofread and correct errors, errors may still occur which can change documentation meaning.        Final diagnoses:  Bronchitis    ED Discharge Orders     None          Veta Palma, [place] 07/05/24 1624  "

## 2024-07-05 NOTE — ED Notes (Signed)
 Patient transported to CT

## 2024-07-05 NOTE — ED Notes (Signed)
 Called [facility] at [facility] for transport 16:29-TC

## 2024-07-05 NOTE — ED Triage Notes (Signed)
 Pt states that she relapsed from a bough of bronchitis.  Pt states that she was feeling better after a course of antibiotics and prednisone  after being dx with bronchitis mid December.  Pt states that she began feeling increasing sob 12.24 and has been feeling worse and worse since . Spo2 85% on RA during triage.

## 2024-07-05 NOTE — ED Notes (Signed)
 [facility] at bedside

## 2024-07-05 NOTE — ED Notes (Signed)
 Call to lab to add D-dimer to blood previously sent

## 2024-07-05 NOTE — H&P (Signed)
 " History and Physical  [name] FMW:[id number] DOB: April 01, 1941 DOA: 07/05/2024  PCP: Ozell Heron HERO, MD   Chief Complaint: Shortness of breath, cough  HPI: [name] is a 83 y.o. female with medical history significant for essential tremor, fibromyalgia, GERD, squamous cell carcinoma of the skin, HTN, HLD, insomnia, OSA, DVT not on o AC, knee osteoarthritis s/p left knee replacement who presents to [facility] for evaluation of shortness of breath and cough.  Patient reports that in mid December, she was diagnosed with bronchitis and treated with azithromycin  and prednisone .  She felt well after the treatment however on Christmas Eve, she started having increased wheezing and cough with mild shortness of breath.  His symptoms have progressed over the last 2 days and when she checks her O2 [place] to 90% so she presents to the ED for further evaluation.  She reports feeling feverish on Christmas and poor appetite but denies any chills, nausea, vomiting, abdominal pain, chest pain, dysuria, headache or dizziness.  ED Course: Initial vitals show patient afebrile, SBP 110-140s, SpO2 94% on 3 L. Initial labs significant for normal renal function, proBNP 395 (normal for age), WBC 11.7, normal procalcitonin, D-dimer 0.79, negative flu/RSV/COVID test, full RVP positive for rhinovirus/enterovirus. CXR shows no active disease.  CTA chest PE study negative for PE.  Pt received IV Solu-Medrol  125 mg x 1, and multiple DuoNebs.  Patient was admitted to [facility] service and transferred to [facility].  Review of Systems: Please see HPI for pertinent positives and negatives. A complete 10 system review of systems are otherwise negative.  Past Medical History:  Diagnosis Date   Anemia    BCC (basal cell carcinoma of skin) 03/27/2006   Above Left Upper Lip (MOH's)   Benign essential tremor    Chronic kidney disease    Essential hypertension, benign    Fibromyalgia    GERD (gastroesophageal reflux  disease)    No longer a problem   Heart murmur    Hemorrhoids    Hyponatremia    secondary to hctz-recurrent March 2010   Low back pain    facet joint pain   Occipital neuralgia    Osteoarthritis    Osteopenia    Pneumonia    Pure hypercholesterolemia 12/16/2013   SCCA (squamous cell carcinoma) of skin 07/15/2002   Left Inner Cheek(in situ)   SCCA (squamous cell carcinoma) of skin 05/06/2004   Right Wing Nose(in situ) -Cx3,5FU   SCCA (squamous cell carcinoma) of skin 01/09/2011   Above Right Upper Lip(in situ)-Cx3   SCCA (squamous cell carcinoma) of skin 01/05/2014   Left [place](in situ)-Cx3,5FU   SCCA (squamous cell carcinoma) of skin 09/30/2014   Right Wrist(in situ)-tx p bx   Shortness of breath dyspnea    Sleep apnea    Mild sleep apnea   Superficial basal cell carcinoma (BCC) 08/17/1982   Right Side Upper Lip   Tuberculosis    as a child   Past Surgical History:  Procedure Laterality Date   TOTAL KNEE ARTHROPLASTY Left 11/08/2015   Procedure: LEFT TOTAL KNEE ARTHROPLASTY;  Surgeon: Dempsey Moan, MD;  Location: [facility];  Service: Orthopedics;  Laterality: Left;   TOTAL KNEE ARTHROPLASTY Right 12/19/2021   Procedure: TOTAL KNEE ARTHROPLASTY;  Surgeon: Moan Dempsey, MD;  Location: [facility];  Service: Orthopedics;  Laterality: Right;   TUBAL LIGATION     Social History:  reports that she has never smoked. She has never used smokeless tobacco. She reports current alcohol   use of about 7.0 standard drinks of alcohol  per week. She reports that she does not use drugs.  Allergies[1]  Family History  Problem Relation Age of Onset   Heart disease Father    Prostate cancer Father    Heart disease Mother    Cancer - Ovarian Mother    Congestive Heart Failure Mother    Stroke Sister      Prior to Admission medications  Medication Sig Start Date End Date Taking? Authorizing Provider  albuterol  (VENTOLIN  HFA) 108 (90 Base) MCG/ACT inhaler Inhale 2 puffs into the lungs every 6  (six) hours as needed for wheezing or shortness of breath. 06/24/24  Yes Ozell Heron HERO, MD  amLODipine  (NORVASC ) 5 MG tablet TAKE 1 TABLET BY MOUTH DAILY 04/24/24  Yes Ozell Heron HERO, MD  atenolol  (TENORMIN ) 25 MG tablet TAKE 1 TABLET BY MOUTH DAILY 05/05/24  Yes Ozell Heron HERO, MD  cetirizine (ZYRTEC) 10 MG tablet Take 10 mg by mouth daily. Patient taking differently: Take 10 mg by mouth as needed.   Yes [provider]  cyclobenzaprine  (FLEXERIL ) 10 MG tablet TAKE 1 TABLET BY MOUTH AT BEDTIME 05/05/24  Yes Ozell Heron HERO, MD  hydroxypropyl methylcellulose / hypromellose (ISOPTO TEARS / GONIOVISC) 2.5 % ophthalmic solution Place 1 drop into both eyes as needed for dry eyes.   Yes [provider]  pregabalin  (LYRICA ) 75 MG capsule TAKE 1 CAPSULE BY MOUTH 2 TIMES A DAY 04/28/24  Yes Ozell Heron HERO, MD  primidone  (MYSOLINE ) 50 MG tablet TAKE 1 TABLET BY MOUTH TWICE A DAY 08/10/23  Yes Ozell Heron HERO, MD  simvastatin  (ZOCOR ) 40 MG tablet Take 1 tablet (40 mg total) by mouth at bedtime. 08/26/15  Yes Dann Candyce RAMAN, MD  traMADol  (ULTRAM ) 50 MG tablet Take 1 tablet (50 mg total) by mouth every 8 (eight) hours as needed. 03/20/24  Yes Ozell Heron HERO, MD  traZODone  (DESYREL ) 50 MG tablet TAKE 1 TABLET BY MOUTH EVERY NIGHT AT BEDTIME AS NEEDED FOR SLEEP 06/17/24  Yes Ozell Heron HERO, MD    Physical Exam: BP (!) 143/62 (BP Location: Right Arm)   Pulse 95   Temp 98.9 F (37.2 C) (Oral)   Resp 16   Ht 5' 3 (1.6 m)   Wt 78.1 kg   SpO2 96%   BMI 30.50 kg/m  General: Pleasant, well-appearing elderly woman laying in bed. No acute distress. HEENT: [place]/AT. Anicteric sclera CV: RRR. No murmurs, rubs, or gallops. No LE edema Pulmonary: On 2 L [place]. Lungs CTAB. Normal effort. Expiratory wheezes in the upper lung fields. Abdominal: Soft, nontender, nondistended. Normal bowel sounds. Extremities: Palpable radial and DP pulses. Normal ROM. Skin: Warm and dry.  No obvious rash or lesions. Neuro: A&Ox3. Moves all extremities. Normal sensation to light touch. No focal deficit. Psych: Normal mood and affect          Labs on Admission:  Basic Metabolic Panel: Recent Labs  Lab 07/05/24 1003  NA 134*  K 3.8  CL 95*  CO2 28  GLUCOSE 120*  BUN 11  CREATININE 0.94  CALCIUM 9.3   Liver Function Tests: No results for input(s): AST, ALT, ALKPHOS, BILITOT, PROT, ALBUMIN in the last 168 hours. No results for input(s): LIPASE, AMYLASE in the last 168 hours. No results for input(s): AMMONIA in the last 168 hours. CBC: Recent Labs  Lab 07/05/24 1003  WBC 11.7*  HGB 12.3  HCT 35.9*  MCV 97.8  PLT 194   Cardiac  Enzymes: No results for input(s): CKTOTAL, CKMB, CKMBINDEX, TROPONINI in the last 168 hours. BNP (last 3 results) No results for input(s): BNP in the last 8760 hours.  ProBNP (last 3 results) Recent Labs    07/05/24 1003  PROBNP 395.0*    CBG: No results for input(s): GLUCAP in the last 168 hours.  Radiological Exams on Admission: CT Angio Chest PE W and/or Wo Contrast Result Date: 07/05/2024 EXAM: CTA CHEST 07/05/2024 12:49:26 PM TECHNIQUE: CTA of the chest was performed without and with the administration of 75 mL of intravenous iohexol  (OMNIPAQUE ) 350 MG/ML injection. Multiplanar reformatted images are provided for review. MIP images are provided for review. Automated exposure control, iterative reconstruction, and/or weight based adjustment of the mA/kV was utilized to reduce the radiation dose to as low as reasonably achievable. COMPARISON: CT angio chest 03/16/23, CT abdomen and pelvis 01/22/2021 CLINICAL HISTORY: Pulmonary embolism (PE) suspected, low to intermediate prob, positive D-dimer; Shortness of breath and new oxygen requirement. Also with cough, eval for pneumonia. FINDINGS: PULMONARY ARTERIES: Pulmonary arteries are adequately opacified for evaluation. No acute pulmonary embolus. Main  pulmonary artery is normal in caliber. MEDIASTINUM: The heart demonstrates at least 3-vessel coronary artery calcifications. The pericardium demonstrates no acute abnormality. There is severe atherosclerotic plaque of the thoracic aorta. LYMPH NODES: No mediastinal, hilar or axillary lymphadenopathy. LUNGS AND PLEURA: Diffuse mild bronchial wall thickening. No focal consolidation or pulmonary edema. No evidence of pleural effusion or pneumothorax. UPPER ABDOMEN: Subcentimeter calcifications within the hepatic parenchyma are nonspecific. SOFT TISSUES AND BONES: Multilevel moderate degenerative changes of the spine. Sclerotic lesion within the T3 vertebral body, likely a bone island. No acute soft tissue abnormality. IMPRESSION: 1. No pulmonary embolism. 2. No acute pulmonary abnormality. 3. Severe atherosclerotic plaque with at least 3-vessel coronary artery calcifications. Electronically signed by: Morgane Naveau MD 07/05/2024 01:55 PM EST RP Workstation: [id number]   DG Chest 2 View Result Date: 07/05/2024 CLINICAL DATA:  Shortness of breath. EXAM: CHEST - 2 VIEW COMPARISON:  01/01/2024 FINDINGS: The heart size and mediastinal contours are within normal limits. Both lungs are clear. The visualized skeletal structures are unremarkable. IMPRESSION: No active cardiopulmonary disease. Electronically Signed   By: Camellia Candle M.D.   On: 07/05/2024 11:29   Assessment/Plan EDNAH HAMMOCK is a 83 y.o. female with medical history significant for essential tremor, fibromyalgia, GERD, squamous cell carcinoma of the skin, HTN, HLD, insomnia, OSA, DVT not on o AC, knee osteoarthritis s/p left knee replacement who presents to [facility] for evaluation of shortness of breath and cough and admitted for acute hypoxic respiratory failure secondary to rhinovirus infection.  # Acute respiratory failure with hypoxia - Presented with shortness of breath found to be hypoxic requiring 2 to 3 L [place] O2 - This is secondary to  rhinovirus infection and acute bronchitis - Continue supplemental O2 and check pulse ox with ambulation in the morning - IS, flutter valve  # Rhinovirus infection # Acute bronchitis - Presented with 2 to 3 days of shortness of breath, cough, fever and poor appetite - RVP positive for rhinovirus/enterovirus, negative flu/RSV/COVID test, no evidence of pneumonia on chest imaging - Patient hypoxic and continues to have expiratory wheezing on exam - Start prednisone  40 mg daily and as needed DuoNeb - IV NS at 100 cc/h for 1 day - As needed Robitussin for cough - Droplet precautions  # HTN - BP slightly elevated with SBP now in the 130s to 140s - Continue amlodipine  and atenolol   #  HLD - Continue simvastatin   # Fibromyalgia - Continue on Flexeril , Lyrica  and as needed tramadol   # Essential tremor - Continue primidone   # Insomnia - Continue trazodone  as needed for sleep  # Generalized weakness - In the setting of acute illness - PT/OT eval and treat  DVT prophylaxis: Lovenox      Code Status: Full Code  Consults called: None  Family Communication: No family at bedside  Severity of Illness: The appropriate patient status for this patient is OBSERVATION. Observation status is judged to be reasonable and necessary in order to provide the required intensity of service to ensure the patient's safety. The patient's presenting symptoms, physical exam findings, and initial radiographic and laboratory data in the context of their medical condition is felt to place them at decreased risk for further clinical deterioration. Furthermore, it is anticipated that the patient will be medically stable for discharge from the hospital within 2 midnights of admission.   Level of care: Telemetry    Lou Claretta HERO, MD 07/05/2024, 7:50 PM [facility] Pager: [phone] Isaiah 41:10   If 7PM-7AM, please contact night-coverage [email or web address] Password [email or web address]     [1]   Allergies Allergen Reactions   Propoxyphene Other (See Comments)   Shellfish Allergy Itching    Severe itching after mussels   Erythromycin Base Other (See Comments)   Hctz [Hydrochlorothiazide] Other (See Comments)    Hyponatremia    Lisinopril Other (See Comments)    Hyponatremia   Sulfa Antibiotics Rash   "

## 2024-07-05 NOTE — ED Notes (Addendum)
 ED Provider at bedside.

## 2024-07-05 NOTE — ED Notes (Signed)
 Patient transported to X-ray

## 2024-07-05 NOTE — ED Notes (Signed)
 Blood Cultures drawn x2.

## 2024-07-05 NOTE — Plan of Care (Signed)
 Plan of Care Note for accepted transfer   Patient: [name] MRN: [id number]   DOA: 07/05/2024  Facility requesting transfer: [facility]  Requesting Provider: Alan Harari, PA-C  Reason for transfer: SOB and Cough  Facility course: The patient is an 83 year old female with past medical history significant for squamous cell carcinoma of the skin, hypertension, history of DVT who presents with concern for ongoing productive cough and shortness of breath for last 2 weeks.  Initially treated with a course of azithromycin  and prednisone  which caused her to have some improvement in her symptoms but then symptoms worsened and she reported oxygen level was low at home.  This prompted her to seek further care in the ED.  Upon arrival a little hypoxemic and was placed on 3 L of supplemental oxygen.  She is negative for COVID, flu, RSV.  D-dimer was a little elevated and CTA PE was done which showed no evidence of pneumonia and no PE.  20 respiratory virus panel was still pending.  Having the EDP add on a BNP but per discussion does not overtly appear volume overloaded.  Accepted to the telemetry floor given her hemodynamic stability for further evaluation of SOB and Cough with concern for bronchitis and respiratory failure.  Plan of care: The patient is accepted for admission to Telemetry unit, at [facility]   Author:  Alejandro Marker, DO [facility] 07/05/2024  Check [email or web address] for on-call coverage.  Nursing staff, Please call [facility] Admits & Consults System-Wide number on Amion as soon as patient's arrival, so appropriate admitting provider can evaluate the pt.

## 2024-07-06 ENCOUNTER — Observation Stay (HOSPITAL_COMMUNITY)

## 2024-07-06 DIAGNOSIS — Z6830 Body mass index (BMI) 30.0-30.9, adult: Secondary | ICD-10-CM | POA: Diagnosis not present

## 2024-07-06 DIAGNOSIS — Z85828 Personal history of other malignant neoplasm of skin: Secondary | ICD-10-CM | POA: Diagnosis not present

## 2024-07-06 DIAGNOSIS — M8580 Other specified disorders of bone density and structure, unspecified site: Secondary | ICD-10-CM | POA: Diagnosis present

## 2024-07-06 DIAGNOSIS — E871 Hypo-osmolality and hyponatremia: Secondary | ICD-10-CM | POA: Diagnosis present

## 2024-07-06 DIAGNOSIS — J206 Acute bronchitis due to rhinovirus: Secondary | ICD-10-CM | POA: Diagnosis present

## 2024-07-06 DIAGNOSIS — G4733 Obstructive sleep apnea (adult) (pediatric): Secondary | ICD-10-CM | POA: Diagnosis present

## 2024-07-06 DIAGNOSIS — J9601 Acute respiratory failure with hypoxia: Secondary | ICD-10-CM | POA: Diagnosis present

## 2024-07-06 DIAGNOSIS — R0609 Other forms of dyspnea: Secondary | ICD-10-CM | POA: Diagnosis not present

## 2024-07-06 DIAGNOSIS — E7800 Pure hypercholesterolemia, unspecified: Secondary | ICD-10-CM | POA: Diagnosis present

## 2024-07-06 DIAGNOSIS — Z5320 Procedure and treatment not carried out because of patient's decision for unspecified reasons: Secondary | ICD-10-CM | POA: Diagnosis not present

## 2024-07-06 DIAGNOSIS — E66811 Obesity, class 1: Secondary | ICD-10-CM | POA: Diagnosis present

## 2024-07-06 DIAGNOSIS — Z79899 Other long term (current) drug therapy: Secondary | ICD-10-CM | POA: Diagnosis not present

## 2024-07-06 DIAGNOSIS — R0602 Shortness of breath: Secondary | ICD-10-CM | POA: Diagnosis present

## 2024-07-06 DIAGNOSIS — G250 Essential tremor: Secondary | ICD-10-CM | POA: Diagnosis present

## 2024-07-06 DIAGNOSIS — Z823 Family history of stroke: Secondary | ICD-10-CM | POA: Diagnosis not present

## 2024-07-06 DIAGNOSIS — Z1152 Encounter for screening for COVID-19: Secondary | ICD-10-CM | POA: Diagnosis not present

## 2024-07-06 DIAGNOSIS — F5101 Primary insomnia: Secondary | ICD-10-CM | POA: Diagnosis present

## 2024-07-06 DIAGNOSIS — Z96653 Presence of artificial knee joint, bilateral: Secondary | ICD-10-CM | POA: Diagnosis present

## 2024-07-06 DIAGNOSIS — Z8249 Family history of ischemic heart disease and other diseases of the circulatory system: Secondary | ICD-10-CM | POA: Diagnosis not present

## 2024-07-06 DIAGNOSIS — M797 Fibromyalgia: Secondary | ICD-10-CM | POA: Diagnosis present

## 2024-07-06 DIAGNOSIS — G4700 Insomnia, unspecified: Secondary | ICD-10-CM | POA: Diagnosis present

## 2024-07-06 DIAGNOSIS — R32 Unspecified urinary incontinence: Secondary | ICD-10-CM | POA: Diagnosis present

## 2024-07-06 DIAGNOSIS — I129 Hypertensive chronic kidney disease with stage 1 through stage 4 chronic kidney disease, or unspecified chronic kidney disease: Secondary | ICD-10-CM | POA: Diagnosis present

## 2024-07-06 DIAGNOSIS — N189 Chronic kidney disease, unspecified: Secondary | ICD-10-CM | POA: Diagnosis present

## 2024-07-06 DIAGNOSIS — Z8701 Personal history of pneumonia (recurrent): Secondary | ICD-10-CM | POA: Diagnosis not present

## 2024-07-06 DIAGNOSIS — B9710 Unspecified enterovirus as the cause of diseases classified elsewhere: Secondary | ICD-10-CM | POA: Diagnosis present

## 2024-07-06 LAB — ECHOCARDIOGRAM COMPLETE
AR max vel: 2.57 cm2
AV Area VTI: 2.73 cm2
AV Area mean vel: 2.46 cm2
AV Mean grad: 8 mmHg
AV Peak grad: 12.5 mmHg
Ao pk vel: 1.77 m/s
Area-P 1/2: 2.5 cm2
Calc EF: 63.6 %
Est EF: >75
Height: 63 in
S' Lateral: 2.4 cm
Single Plane A2C EF: 67.9 %
Single Plane A4C EF: 57.1 %
Weight: 2755.2 [oz_av]

## 2024-07-06 LAB — CBC
HCT: 33.7 % — ABNORMAL LOW (ref 36.0–46.0)
Hemoglobin: 11.5 g/dL — ABNORMAL LOW (ref 12.0–15.0)
MCH: 33.8 pg (ref 26.0–34.0)
MCHC: 34.1 g/dL (ref 30.0–36.0)
MCV: 99.1 fL (ref 80.0–100.0)
Platelets: 183 K/uL (ref 150–400)
RBC: 3.4 MIL/uL — ABNORMAL LOW (ref 3.87–5.11)
RDW: 13.2 % (ref 11.5–15.5)
WBC: 11.2 K/uL — ABNORMAL HIGH (ref 4.0–10.5)
nRBC: 0 % (ref 0.0–0.2)

## 2024-07-06 LAB — BASIC METABOLIC PANEL WITH GFR
Anion gap: 13 (ref 5–15)
BUN: 13 mg/dL (ref 8–23)
CO2: 24 mmol/L (ref 22–32)
Calcium: 9.2 mg/dL (ref 8.9–10.3)
Chloride: 96 mmol/L — ABNORMAL LOW (ref 98–111)
Creatinine, Ser: 0.87 mg/dL (ref 0.44–1.00)
GFR, Estimated: >60 mL/min
Glucose, Bld: 185 mg/dL — ABNORMAL HIGH (ref 70–99)
Potassium: 4.1 mmol/L (ref 3.5–5.1)
Sodium: 133 mmol/L — ABNORMAL LOW (ref 135–145)

## 2024-07-06 MED ORDER — BENZONATATE 100 MG PO CAPS
200.0000 mg | ORAL_CAPSULE | Freq: Three times a day (TID) | ORAL | Status: DC
  Administered 2024-07-06 (×2): 200 mg via ORAL
  Filled 2024-07-06 (×2): qty 2

## 2024-07-06 MED ORDER — SODIUM CHLORIDE 0.9 % IV SOLN: INTRAVENOUS | Status: DC

## 2024-07-06 NOTE — Progress Notes (Signed)
 OT Cancellation Note  Patient Details Name: [name] MRN: [id number] DOB: [birth year]   Cancelled Treatment:    Reason Eval/Treat Not Completed: OT screened, no needs identified, will sign off.  Delanna JINNY Lesches, OTR/L 07/06/2024, 9:47 AM

## 2024-07-06 NOTE — Plan of Care (Signed)
  Problem: Education: Goal: Knowledge of General Education information will improve Description: Including pain rating scale, medication(s)/side effects and non-pharmacologic comfort measures Outcome: Progressing   Problem: Health Behavior/Discharge Planning: Goal: Ability to manage health-related needs will improve Outcome: Progressing   Problem: Clinical Measurements: Goal: Ability to maintain clinical measurements within normal limits will improve Outcome: Progressing Goal: Will remain free from infection Outcome: Progressing Goal: Diagnostic test results will improve Outcome: Progressing Goal: Respiratory complications will improve Outcome: Progressing Goal: Cardiovascular complication will be avoided Outcome: Progressing   Problem: Activity: Goal: Risk for activity intolerance will decrease Outcome: Progressing   Problem: Nutrition: Goal: Adequate nutrition will be maintained Outcome: Progressing   Problem: Elimination: Goal: Will not experience complications related to bowel motility Outcome: Progressing Goal: Will not experience complications related to urinary retention Outcome: Progressing   Problem: Coping: Goal: Level of anxiety will decrease Outcome: Progressing   Problem: Skin Integrity: Goal: Risk for impaired skin integrity will decrease Outcome: Progressing

## 2024-07-06 NOTE — Evaluation (Signed)
 Physical Therapy Evaluation Patient Details Name: [name] MRN: [id number] DOB: 12-29-1940 Today's Date: 07/06/2024  History of Present Illness  Pt admitted from home 2* acute respiratory failure wtih hypoxia and dx with Rhinovirus and acute bronchitis.  Pt with hx of B TKR, htn, esssential tremor, and Fibromyalgia  Clinical Impression  Pt very pleasant and mobilizing well with good balance and safety awareness but activity tolerance ltd by increased WOB and need for supplemental O2 - pt maintained sats 95% or higher on 1L with HR elevated to 117 but on RA desat to upper 80s - RN aware.  PT service will sign off at this time and defer further mobilization to mobility team for remainder of this hospital stay.  Pt eager for dc home.        If plan is discharge home, recommend the following:     Can travel by private vehicle        Equipment Recommendations None recommended by PT  Recommendations for Other Services       Functional Status Assessment Patient has not had a recent decline in their functional status     Precautions / Restrictions Precautions Precautions: None Restrictions Weight Bearing Restrictions Per Provider Order: No      Mobility  Bed Mobility Overal bed mobility: Modified Independent                  Transfers Overall transfer level: Modified independent Equipment used: None                    Ambulation/Gait Ambulation/Gait assistance: Independent Gait Distance (Feet): 450 Feet Assistive device: None Gait Pattern/deviations: Step-through pattern, WFL(Within Functional Limits)       General Gait Details: No physcial assist, good stability, executing dance moves for the nurse in the hallway  Stairs            Wheelchair Mobility     Tilt Bed    Modified Rankin (Stroke Patients Only)       Balance Overall balance assessment: Independent                                           Pertinent  Vitals/Pain Pain Assessment Pain Assessment: No/denies pain    Home Living Family/patient expects to be discharged to:: Private residence Living Arrangements: Alone Available Help at Discharge: Family;Available PRN/intermittently Type of Home: House Home Access: Level entry       Home Layout: One level Home Equipment: Agricultural Consultant (2 wheels);Grab bars - tub/shower;Grab bars - toilet;Cane - single point Additional Comments: has dtr who is nurse    Prior Function Prior Level of Function : Independent/Modified Independent                     Extremity/Trunk Assessment   Upper Extremity Assessment Upper Extremity Assessment: Overall WFL for tasks assessed    Lower Extremity Assessment Lower Extremity Assessment: Overall WFL for tasks assessed    Cervical / Trunk Assessment Cervical / Trunk Assessment: Normal  Communication   Communication Communication: No apparent difficulties    Cognition Arousal: Alert Behavior During Therapy: WFL for tasks assessed/performed   PT - Cognitive impairments: No apparent impairments                         Following commands: Intact  Cueing Cueing Techniques: Verbal cues     General Comments      Exercises     Assessment/Plan    PT Assessment Patient needs continued PT services  PT Problem List Decreased activity tolerance       PT Treatment Interventions Gait training    PT Goals (Current goals can be found in the Care Plan section)  Acute Rehab PT Goals Patient Stated Goal: HOME and not on O@ PT Goal Formulation: All assessment and education complete, DC therapy    Frequency Min 1X/week     Co-evaluation               AM-PAC PT 6 Clicks Mobility  Outcome Measure Help needed turning from your back to your side while in a flat bed without using bedrails?: None Help needed moving from lying on your back to sitting on the side of a flat bed without using bedrails?: None Help  needed moving to and from a bed to a chair (including a wheelchair)?: None Help needed standing up from a chair using your arms (e.g., wheelchair or bedside chair)?: None Help needed to walk in hospital room?: None Help needed climbing 3-5 steps with a railing? : None 6 Click Score: 24    End of Session Equipment Utilized During Treatment: Gait belt;Oxygen Activity Tolerance: Patient tolerated treatment well Patient left: in bed;with call bell/phone within reach;with bed alarm set Nurse Communication: Mobility status PT Visit Diagnosis: Difficulty in walking, not elsewhere classified (R26.2)    Time: 9088-9064 PT Time Calculation (min) (ACUTE ONLY): 24 min   Charges:   PT Evaluation $PT Eval Low Complexity: 1 Low   PT General Charges $$ ACUTE PT VISIT: 1 Visit         [facility] PT Acute Rehabilitation Services Office [phone]   Veretta Sabourin 07/06/2024, 12:49 PM

## 2024-07-06 NOTE — Plan of Care (Signed)
  Problem: Activity: Goal: Risk for activity intolerance will decrease Outcome: Progressing   Problem: Nutrition: Goal: Adequate nutrition will be maintained Outcome: Progressing   Problem: Coping: Goal: Level of anxiety will decrease Outcome: Progressing   Problem: Pain Managment: Goal: General experience of comfort will improve and/or be controlled Outcome: Progressing

## 2024-07-06 NOTE — Progress Notes (Signed)
2D echo complete 

## 2024-07-06 NOTE — Progress Notes (Signed)
 "  PROGRESS NOTE  [name] FMW:[id number] DOB: 11/12/1940 DOA: 07/05/2024 PCP: Ozell Heron HERO, MD  HPI/Recap of past 24 hours: [name] is a 83 y.o. female with medical history significant for essential tremor, fibromyalgia, GERD, squamous cell carcinoma of the skin, HTN, HLD, insomnia, OSA, DVT not on o AC, knee osteoarthritis s/p left knee replacement who presents to [facility] for evaluation of SOB + cough. Patient reports that in mid December, she was diagnosed with bronchitis and treated with azithromycin  and prednisone  and felt better, around christmas Eve, started having increased wheezing, cough with some SOB which have progressively gotten worse since then, so she decided to come to the ED for further evaluation.  In the ED, noted to be hypoxic, around 85% on room air, other vitals fairly stable.  Labs showed proBNP 395, WBC 11.7, D-dimer 0.79, chest x-ray unremarkable, CTA chest negative for any PE/pneumonia.  Negative flu/RSV/COVID test, although found to be positive for rhinovirus/enterovirus.  Patient admitted for further management.    Today, patient still noted to be hypoxic/desaturating upon ambulation, requiring supplemental O2 with increased work of breathing and tachycardia.  Patient denies any chest pain.  Still complaining of significant coughing.    Assessment/Plan: Principal Problem:   Acute respiratory failure with hypoxia (HCC) Active Problems:   SOB (shortness of breath)   Rhinovirus infection   Bronchitis   Generalized weakness   Acute respiratory failure with hypoxia Rhino/entero virus infection Acute bronchitis Currently hypoxic/desaturation to the 80s upon ambulation on room air Requiring about 2 L of O2, saturating well Negative flu/RSV/COVID ProBNP 395, procalcitonin negative Chest x-ray negative for pneumonia CTA chest negative for PE, or pneumonia Echo pending Continue prednisone , DuoNeb as needed, scheduled albuterol  neb Continue  supplemental O2, IS, flutter valve  Hyponatremia Mild Continue gentle IV hydration Daily BMP   HTN BP stable Continue amlodipine  and atenolol    HLD Continue simvastatin    Fibromyalgia Continue on Flexeril , Lyrica  and as needed tramadol    Essential tremor Continue primidone    Insomnia Continue trazodone  as needed for sleep   Generalized weakness In the setting of acute illness PT/OT eval and treat  Class I obesity Lifestyle modification advised     Estimated body mass index is 30.5 kg/m as calculated from the following:   Height as of this encounter: 5' 3 (1.6 m).   Weight as of this encounter: 78.1 kg.     Code Status: Full  Family Communication: None at bedside  Disposition Plan: Status is: Observation The patient remains OBS appropriate and will d/c before 2 midnights.      Consultants: None  Procedures: None  Antimicrobials: None  DVT prophylaxis: Lovenox    Objective: Vitals:   07/06/24 0605 07/06/24 0817 07/06/24 0935 07/06/24 1315  BP: 136/67   (!) 135/105  Pulse: 90   (!) 113  Resp: 18   18  Temp: 98.1 F (36.7 C)   98.6 F (37 C)  TempSrc: Oral   Oral  SpO2: 98% 97% 96% 97%  Weight:      Height:        Intake/Output Summary (Last 24 hours) at 07/06/2024 1334 Last data filed at 07/06/2024 1228 Gross per 24 hour  Intake 1996.11 ml  Output --  Net 1996.11 ml   Filed Weights   07/05/24 1836  Weight: 78.1 kg    Exam: General: NAD  Cardiovascular: S1, S2 present Respiratory: Diminished breath sounds bilaterally Abdomen: Soft, nontender, nondistended, bowel sounds present Musculoskeletal: No bilateral  pedal edema noted Skin: Normal Psychiatry: Normal mood     Data Reviewed: CBC: Recent Labs  Lab 07/05/24 1003 07/06/24 0656  WBC 11.7* 11.2*  HGB 12.3 11.5*  HCT 35.9* 33.7*  MCV 97.8 99.1  PLT 194 183   Basic Metabolic Panel: Recent Labs  Lab 07/05/24 1003 07/06/24 0656  NA 134* 133*  K 3.8 4.1  CL  95* 96*  CO2 28 24  GLUCOSE 120* 185*  BUN 11 13  CREATININE 0.94 0.87  CALCIUM 9.3 9.2   GFR: Estimated Creatinine Clearance: 47.6 mL/min (by C-G formula based on SCr of 0.87 mg/dL). Liver Function Tests: No results for input(s): AST, ALT, ALKPHOS, BILITOT, PROT, ALBUMIN in the last 168 hours. No results for input(s): LIPASE, AMYLASE in the last 168 hours. No results for input(s): AMMONIA in the last 168 hours. Coagulation Profile: No results for input(s): INR, PROTIME in the last 168 hours. Cardiac Enzymes: No results for input(s): CKTOTAL, CKMB, CKMBINDEX, TROPONINI in the last 168 hours. BNP (last 3 results) Recent Labs    07/05/24 1003  PROBNP 395.0*   HbA1C: No results for input(s): HGBA1C in the last 72 hours. CBG: No results for input(s): GLUCAP in the last 168 hours. Lipid Profile: No results for input(s): CHOL, HDL, LDLCALC, TRIG, CHOLHDL, LDLDIRECT in the last 72 hours. Thyroid Function Tests: No results for input(s): TSH, T4TOTAL, FREET4, T3FREE, THYROIDAB in the last 72 hours. Anemia Panel: No results for input(s): VITAMINB12, FOLATE, FERRITIN, TIBC, IRON, RETICCTPCT in the last 72 hours. Urine analysis:    Component Value Date/Time   COLORURINE YELLOW 01/21/2021 2046   APPEARANCEUR CLEAR 01/21/2021 2046   LABSPEC 1.020 01/21/2021 2046   PHURINE 5.0 01/21/2021 2046   GLUCOSEU NEGATIVE 01/21/2021 2046   HGBUR MODERATE (A) 01/21/2021 2046   BILIRUBINUR NEGATIVE 01/21/2021 2046   KETONESUR 80 (A) 01/21/2021 2046   PROTEINUR 100 (A) 01/21/2021 2046   NITRITE NEGATIVE 01/21/2021 2046   LEUKOCYTESUR NEGATIVE 01/21/2021 2046   Sepsis Labs: @LABRCNTIP (procalcitonin:4,lacticidven:4)  ) Recent Results (from the past 240 hours)  Resp panel by RT-PCR (RSV, Flu A&B, Covid) Anterior Nasal Swab     Status: None   Collection Time: 07/05/24 10:53 AM   Specimen: Anterior Nasal Swab  Result Value Ref  Range Status   SARS Coronavirus 2 by RT PCR NEGATIVE NEGATIVE Final    Comment: (NOTE) SARS-CoV-2 target nucleic acids are NOT DETECTED.  The SARS-CoV-2 RNA is generally detectable in upper respiratory specimens during the acute phase of infection. The lowest concentration of SARS-CoV-2 viral copies this assay can detect is 138 copies/mL. A negative result does not preclude SARS-Cov-2 infection and should not be used as the sole basis for treatment or other patient management decisions. A negative result may occur with  improper specimen collection/handling, submission of specimen other than nasopharyngeal swab, presence of viral mutation(s) within the areas targeted by this assay, and inadequate number of viral copies(<138 copies/mL). A negative result must be combined with clinical observations, patient history, and epidemiological information. The expected result is Negative.  Fact Sheet for Patients:  bloggercourse.com  Fact Sheet for Healthcare Providers:  seriousbroker.it  This test is no t yet approved or cleared by the [place]  FDA and  has been authorized for detection and/or diagnosis of SARS-CoV-2 by FDA under an Emergency Use Authorization (EUA). This EUA will remain  in effect (meaning this test can be used) for the duration of the COVID-19 declaration under Section 564(b)(1) of the Act, 21 U.S.C.section  360bbb-3(b)(1), unless the authorization is terminated  or revoked sooner.       Influenza A by PCR NEGATIVE NEGATIVE Final   Influenza B by PCR NEGATIVE NEGATIVE Final    Comment: (NOTE) The Xpert Xpress SARS-CoV-2/FLU/RSV plus assay is intended as an aid in the diagnosis of influenza from Nasopharyngeal swab specimens and should not be used as a sole basis for treatment. Nasal washings and aspirates are unacceptable for Xpert Xpress SARS-CoV-2/FLU/RSV testing.  Fact Sheet for  Patients: bloggercourse.com  Fact Sheet for Healthcare Providers: seriousbroker.it  This test is not yet approved or cleared by the [place]  FDA and has been authorized for detection and/or diagnosis of SARS-CoV-2 by FDA under an Emergency Use Authorization (EUA). This EUA will remain in effect (meaning this test can be used) for the duration of the COVID-19 declaration under Section 564(b)(1) of the Act, 21 U.S.C. section 360bbb-3(b)(1), unless the authorization is terminated or revoked.     Resp Syncytial Virus by PCR NEGATIVE NEGATIVE Final    Comment: (NOTE) Fact Sheet for Patients: bloggercourse.com  Fact Sheet for Healthcare Providers: seriousbroker.it  This test is not yet approved or cleared by the [place]  FDA and has been authorized for detection and/or diagnosis of SARS-CoV-2 by FDA under an Emergency Use Authorization (EUA). This EUA will remain in effect (meaning this test can be used) for the duration of the COVID-19 declaration under Section 564(b)(1) of the Act, 21 U.S.C. section 360bbb-3(b)(1), unless the authorization is terminated or revoked.  Performed at [facility], [address], [place], [place] [address]   Respiratory (~20 pathogens) panel by PCR     Status: Abnormal   Collection Time: 07/05/24 10:53 AM   Specimen: Nasopharyngeal Swab; Respiratory  Result Value Ref Range Status   Adenovirus NOT DETECTED NOT DETECTED Final   Coronavirus 229E NOT DETECTED NOT DETECTED Final    Comment: (NOTE) The Coronavirus on the Respiratory Panel, DOES NOT test for the novel  Coronavirus (2019 nCoV)    Coronavirus HKU1 NOT DETECTED NOT DETECTED Final   Coronavirus NL63 NOT DETECTED NOT DETECTED Final   Coronavirus OC43 NOT DETECTED NOT DETECTED Final   Metapneumovirus NOT DETECTED NOT DETECTED Final   Rhinovirus / Enterovirus  DETECTED (A) NOT DETECTED Final   Influenza A NOT DETECTED NOT DETECTED Final   Influenza B NOT DETECTED NOT DETECTED Final   Parainfluenza Virus 1 NOT DETECTED NOT DETECTED Final   Parainfluenza Virus 2 NOT DETECTED NOT DETECTED Final   Parainfluenza Virus 3 NOT DETECTED NOT DETECTED Final   Parainfluenza Virus 4 NOT DETECTED NOT DETECTED Final   Respiratory Syncytial Virus NOT DETECTED NOT DETECTED Final   Bordetella pertussis NOT DETECTED NOT DETECTED Final   Bordetella Parapertussis NOT DETECTED NOT DETECTED Final   Chlamydophila pneumoniae NOT DETECTED NOT DETECTED Final   Mycoplasma pneumoniae NOT DETECTED NOT DETECTED Final    Comment: Performed at [facility], [address] [place], [place] [address]  Blood culture (routine x 2)     Status: None (Preliminary result)   Collection Time: 07/05/24  4:55 PM   Specimen: BLOOD  Result Value Ref Range Status   Specimen Description   Final    BLOOD LEFT ANTECUBITAL Performed at [facility], [address] [place], [place] [address]    Special Requests   Final    BOTTLES DRAWN AEROBIC AND ANAEROBIC Blood Culture adequate volume Performed at [facility], [address], [place], [place] [address]  Culture   Final    NO GROWTH < 24 HOURS Performed at [facility], [address], [place], [place] [address]    Report Status PENDING  Incomplete  Blood culture (routine x 2)     Status: None (Preliminary result)   Collection Time: 07/05/24  4:56 PM   Specimen: BLOOD  Result Value Ref Range Status   Specimen Description   Final    BLOOD RIGHT ANTECUBITAL Performed at [facility], [address], [place], [place] [address]    Special Requests   Final    BOTTLES DRAWN AEROBIC AND ANAEROBIC Blood Culture adequate volume Performed at [facility], [address] [place], [place] [address]    Culture   Final    NO GROWTH < 24  HOURS Performed at [facility], [address], [place], [place] [address]    Report Status PENDING  Incomplete      Studies: No results found.  Scheduled Meds:  albuterol   2.5 mg Nebulization TID   amLODipine   5 mg Oral Daily   atenolol   25 mg Oral Daily   benzonatate   200 mg Oral TID   cyclobenzaprine   10 mg Oral QHS   enoxaparin  (LOVENOX ) injection  40 mg Subcutaneous Q24H   predniSONE   40 mg Oral Q breakfast   pregabalin   75 mg Oral BID   primidone   50 mg Oral BID   simvastatin   40 mg Oral QHS    Continuous Infusions:  sodium chloride  100 mL/hr at 07/06/24 0904     LOS: 0 days     Briana JINNY Cage, MD [facility]  If 7PM-7AM, please contact night-coverage [email or web address] 07/06/2024, 1:34 PM    "

## 2024-07-06 NOTE — Care Management Obs Status (Signed)
 MEDICARE OBSERVATION STATUS NOTIFICATION   Patient Details  Name: GIBSON TELLERIA MRN: [id number] Date of Birth: 06-Jan-1941   Medicare Observation Status Notification Given:  Yes    Heather DELENA Saltness, LCSW 07/06/2024, 2:58 PM

## 2024-07-07 DIAGNOSIS — E871 Hypo-osmolality and hyponatremia: Secondary | ICD-10-CM

## 2024-07-07 DIAGNOSIS — J9601 Acute respiratory failure with hypoxia: Secondary | ICD-10-CM | POA: Diagnosis not present

## 2024-07-07 LAB — CBC
HCT: 36.9 % (ref 36.0–46.0)
Hemoglobin: 12.4 g/dL (ref 12.0–15.0)
MCH: 33.5 pg (ref 26.0–34.0)
MCHC: 33.6 g/dL (ref 30.0–36.0)
MCV: 99.7 fL (ref 80.0–100.0)
Platelets: 246 K/uL (ref 150–400)
RBC: 3.7 MIL/uL — ABNORMAL LOW (ref 3.87–5.11)
RDW: 13.9 % (ref 11.5–15.5)
WBC: 21.5 K/uL — ABNORMAL HIGH (ref 4.0–10.5)
nRBC: 0 % (ref 0.0–0.2)

## 2024-07-07 LAB — BASIC METABOLIC PANEL WITH GFR
Anion gap: 12 (ref 5–15)
BUN: 13 mg/dL (ref 8–23)
CO2: 25 mmol/L (ref 22–32)
Calcium: 9.7 mg/dL (ref 8.9–10.3)
Chloride: 99 mmol/L (ref 98–111)
Creatinine, Ser: 0.92 mg/dL (ref 0.44–1.00)
GFR, Estimated: >60 mL/min
Glucose, Bld: 122 mg/dL — ABNORMAL HIGH (ref 70–99)
Potassium: 4.3 mmol/L (ref 3.5–5.1)
Sodium: 137 mmol/L (ref 135–145)

## 2024-07-07 LAB — LACTIC ACID, PLASMA: Lactic Acid, Venous: 2.2 mmol/L (ref 0.5–1.9)

## 2024-07-07 MED ORDER — BENZONATATE 100 MG PO CAPS
200.0000 mg | ORAL_CAPSULE | Freq: Three times a day (TID) | ORAL | Status: AC
  Administered 2024-07-07 (×3): 200 mg via ORAL
  Filled 2024-07-07 (×5): qty 2

## 2024-07-07 MED ORDER — TRAMADOL HCL 50 MG PO TABS: 50.0000 mg | ORAL_TABLET | Freq: Three times a day (TID) | ORAL | 0 refills | Status: AC | PRN

## 2024-07-07 MED ORDER — GERHARDT'S BUTT CREAM
TOPICAL_CREAM | Freq: Three times a day (TID) | CUTANEOUS | Status: DC
  Filled 2024-07-07: qty 20

## 2024-07-07 MED ORDER — PREGABALIN 75 MG PO CAPS: 75.0000 mg | ORAL_CAPSULE | Freq: Two times a day (BID) | ORAL | 0 refills | Status: AC

## 2024-07-07 MED ORDER — PHENYLEPHRINE-MINERAL OIL-PET 0.25-14-74.9 % RE OINT
1.0000 | TOPICAL_OINTMENT | Freq: Two times a day (BID) | RECTAL | Status: DC | PRN
  Filled 2024-07-07: qty 57

## 2024-07-07 NOTE — Plan of Care (Signed)
 " Hospital at Home Interim Note     [name]   FMW:[id number]  DOB: 1941-01-15  DOA: 07/05/2024     1 Date of Service: 07/07/2024   HPI/Recap of past 24 hours: [name] is a 83 y.o. female with medical history significant for essential tremor, fibromyalgia, GERD, squamous cell carcinoma of the skin, HTN, HLD, insomnia, OSA, DVT not on o AC, knee osteoarthritis s/p left knee replacement who presents to the [facility] for evaluation of SOB + cough. Patient reports that in mid December, she was diagnosed with bronchitis and treated with azithromycin  and prednisone  and felt better, around christmas Eve, started having increased wheezing, cough with some SOB which have progressively gotten worse since then, so she decided to come to the ED for further evaluation.  In the ED, noted to be hypoxic, around 85% on room air, other vitals fairly stable.  Labs showed proBNP 395, WBC 11.7, D-dimer 0.79, chest x-ray unremarkable, CTA chest negative for any PE/pneumonia.  Negative flu/RSV/COVID test, although found to be positive for rhinovirus/enterovirus.  Patient admitted for further management.       Early this morning, patient noted to be increasingly paranoid and agitated, refusing lab draws, oxygen.   In further discussion with patient, pt reports waking up feeling startled. Believes some of her symptoms may be attributed to recent steroid use in setting of bronchitis.   Patient currently alert, awake, oriented x 3.  Offered my support/encouragement.  Minimal cough though w/ still persisting SOB.  I had length discussion with patient and family at the bedside. Pt is agreeable to the hospital at home program.   Subjective:  No mild improvement in SOB. Though still with [facility] Problems Assessment and Plan: * Acute respiratory failure with hypoxia (HCC) Viral bronchitis  Rhinovirus/Enterovirus  Decompensated resp status now requiring 1-2 L to keep O2 sats >92% CTA chest negative  for any acute focal infiltrate though w/ severe 3 vessel coronary disease  Resp panel + for rhinovirus/enterovirus  Started on steroid regimen, duonebs and supplemental O2 for treatment  Clinically improving though still mildly symptomatic  Will continue treatment plan for now  Supplemental O2 prn  Reassess as appropriate   Hyponatremia Clinically resolved (Na 133-->134--137)  Improved w/ IVF  OSA (obstructive sleep apnea) Pt refusing CPAP in the outpatient setting    Essential tremor Cont home primidone    Primary insomnia Cont prn ambien   Follow   Fibromyalgia syndrome Baseline chronic fibromyalgia  On multiple medications including Flexeril , Lyrica  and as needed tramadol   Cont home regimen  Monitor          Objective Vital signs were reviewed and unremarkable. Vitals:   07/07/24 0606 07/07/24 0720 07/07/24 0743 07/07/24 1315  BP: (!) 143/77   (!) 151/75  Pulse: 88   94  Temp: 98.5 F (36.9 C) 98.3 F (36.8 C)  (!) 97.5 F (36.4 C)  Resp: 18   17  Height:      Weight:      SpO2: 96%  96% 98%  TempSrc:  Oral    BMI (Calculated):        Exam Physical Exam Constitutional:      Appearance: She is normal weight.  HENT:     Head: Normocephalic and atraumatic.  Eyes:     Pupils: Pupils are equal, round, and reactive to light.  Cardiovascular:     Rate and Rhythm: Normal rate.  Pulmonary:     Effort: Pulmonary effort is  normal.     Breath sounds: Normal breath sounds.  Musculoskeletal:     Cervical back: Normal range of motion.      Labs / Other Information There are no new results to review at this time.  ECHOCARDIOGRAM COMPLETE    ECHOCARDIOGRAM REPORT       Patient Name:   [name] [facility] Date of Exam: 07/06/2024 Medical Rec #:  [id number]    Height:       63.0 in Accession #:    [id number]   Weight:       172.2 lb Date of Birth:  1940/08/15    BSA:          1.814 m Patient Age:    83 years     BP:           135/105 mmHg Patient Gender: F             HR:           114 bpm. Exam Location:  Inpatient  Procedure: 2D Echo, Color Doppler and Cardiac Doppler (Both Spectral and Color            Flow Doppler were utilized during procedure).  Indications:    Dyspnea R06.00   History:        Patient has no prior history of Echocardiogram examinations.                 Signs/Symptoms:Murmur and Hypertensive Heart Disease.   Sonographer:    Sydnee Wilson RDCS Referring Phys: [id number] NKEIRUKA J EZENDUKA  IMPRESSIONS   1. Left ventricular ejection fraction, by estimation, is >75%. The left ventricle has hyperdynamic function. The left ventricle has no regional wall motion abnormalities. Left ventricular diastolic parameters were normal.  2. Right ventricular systolic function is normal. The right ventricular size is normal.  3. The mitral valve is normal in structure. No evidence of mitral valve regurgitation. No evidence of mitral stenosis.  4. The aortic valve is normal in structure. Aortic valve regurgitation is not visualized. No aortic stenosis is present.  5. The inferior vena cava is normal in size with greater than 50% respiratory variability, suggesting right atrial pressure of 3 mmHg.  FINDINGS  Left Ventricle: Left ventricular ejection fraction, by estimation, is >75%. The left ventricle has hyperdynamic function. The left ventricle has no regional wall motion abnormalities. The left ventricular internal cavity size was normal in size. There  is no left ventricular hypertrophy. Left ventricular diastolic parameters were normal.  Right Ventricle: The right ventricular size is normal. No increase in right ventricular wall thickness. Right ventricular systolic function is normal.  Left Atrium: Left atrial size was normal in size.  Right Atrium: Right atrial size was normal in size.  Pericardium: There is no evidence of pericardial effusion.  Mitral Valve: The mitral valve is normal in structure. No evidence of mitral valve  regurgitation. No evidence of mitral valve stenosis.  Tricuspid Valve: The tricuspid valve is normal in structure. Tricuspid valve regurgitation is not demonstrated. No evidence of tricuspid stenosis.  Aortic Valve: The aortic valve is normal in structure. Aortic valve regurgitation is not visualized. No aortic stenosis is present. Aortic valve mean gradient measures 8.0 mmHg. Aortic valve peak gradient measures 12.5 mmHg. Aortic valve area, by VTI  measures 2.73 cm.  Pulmonic Valve: The pulmonic valve was normal in structure. Pulmonic valve regurgitation is not visualized. No evidence of pulmonic stenosis.  Aorta: The aortic root is normal in size and structure.  Venous: The inferior vena cava is normal in size with greater than 50% respiratory variability, suggesting right atrial pressure of 3 mmHg.  IAS/Shunts: No atrial level shunt detected by color flow Doppler.    LEFT VENTRICLE PLAX 2D LVIDd:         3.60 cm     Diastology LVIDs:         2.40 cm     LV e' medial:    12.00 cm/s LV PW:         1.00 cm     LV E/e' medial:  9.2 LV IVS:        0.80 cm     LV e' lateral:   14.00 cm/s LVOT diam:     2.00 cm     LV E/e' lateral: 7.9 LV SV:         90 LV SV Index:   50 LVOT Area:     3.14 cm   LV Volumes (MOD) LV vol d, MOD A2C: 89.0 ml LV vol d, MOD A4C: 60.6 ml LV vol s, MOD A2C: 28.6 ml LV vol s, MOD A4C: 26.0 ml LV SV MOD A2C:     60.4 ml LV SV MOD A4C:     60.6 ml LV SV MOD BP:      46.9 ml  RIGHT VENTRICLE TAPSE (M-mode): 1.8 cm  LEFT ATRIUM             Index        RIGHT ATRIUM           Index LA diam:        4.20 cm 2.31 cm/m   RA Area:     12.30 cm LA Vol (A2C):   64.3 ml 35.44 ml/m  RA Volume:   24.10 ml  13.28 ml/m LA Vol (A4C):   49.0 ml 27.01 ml/m LA Biplane Vol: 57.5 ml 31.69 ml/m  AORTIC VALVE AV Area (Vmax):    2.57 cm AV Area (Vmean):   2.46 cm AV Area (VTI):     2.73 cm AV Vmax:           177.00 cm/s AV Vmean:          133.000 cm/s AV VTI:             0.329 m AV Peak Grad:      12.5 mmHg AV Mean Grad:      8.0 mmHg LVOT Vmax:         145.00 cm/s LVOT Vmean:        104.000 cm/s LVOT VTI:          0.286 m LVOT/AV VTI ratio: 0.87   AORTA Ao Root diam: 2.90 cm  MITRAL VALVE                TRICUSPID VALVE MV Area (PHT): 2.50 cm     TR Peak grad:   55.1 mmHg MV E velocity: 110.00 cm/s  TR Vmax:        371.00 cm/s MV A velocity: 93.00 cm/s MV E/A ratio:  1.18         SHUNTS                             Systemic VTI:  0.29 m                             Systemic  Diam: 2.00 cm  Oneil Parchment MD Electronically signed by Oneil Parchment MD Signature Date/Time: 07/06/2024/4:12:43 PM      Final    Lab Results  Component Value Date   WBC 21.5 (H) 07/07/2024   HGB 12.4 07/07/2024   HCT 36.9 07/07/2024   MCV 99.7 07/07/2024   PLT 246 07/07/2024   Last metabolic panel Lab Results  Component Value Date   GLUCOSE 122 (H) 07/07/2024   NA 137 07/07/2024   K 4.3 07/07/2024   CL 99 07/07/2024   CO2 25 07/07/2024   BUN 13 07/07/2024   CREATININE 0.92 07/07/2024   GFRNONAA >60 07/07/2024   CALCIUM 9.7 07/07/2024   PHOS 4.1 01/26/2021   PROT 7.2 05/16/2024   ALBUMIN 4.4 05/16/2024   BILITOT 0.4 05/16/2024   ALKPHOS 81 05/16/2024   AST 22 05/16/2024   ALT 13 05/16/2024   ANIONGAP 12 07/07/2024   Hospital at Home Admission Criteria Checklist:  Formal consent explained in detail and signed at the bedside: yes Patient meets inpatient admission criteria (see below for further details) yes Is pt Medicare FFS/[employer] Medicare-Medicaid, [employer], [employer] Medicare, [employer], [employer] ( required for initial launch with plan to expand)? yes Lives within 25 mil/ 30 min from [facility] within [place](pt may stay with family member during admission who lives within 25 miles or 30 min from [facility] w/in [facility])? yes Hemodynamically stable with relatively low risk of clinical deterioration-not requiring ICU? yes Age  >55? yes Does not require frequent touch-points or complex interventions/medications (ie Titrated Infusions (IV insulin, heparin  drips, vasoactive drips, use of infused or injectable controlled substances, patients on insulin)? no Any Behavioral Health comorbidities likely to increase risk for in-home care (ie Acute delirium or experiencing a marked altered mental status and cause is not a treatable condition in the home)? no Has the patient been on BIPAP during course of ED evaluation or hospitalization? no IF YES, Has the patient been off of BIPAP for >24 hours(If NO-THEN PATIENT DOES MEET INCLUSION CRITERIA)? not applicable On Room Air or Needs oxygen at home (<6L)? is on oxygen at 1-2 L/min per nasal cannula. Active safety concerns (ie Unable to use bedside commode independently and lacks caregiver support for safety- needs SNF placement, unable to obtain IV access)? no Has skin check been performed? yes  Has Physical Therapy screened the patient? yes  Common admission diagnoses including: CAP, COPD Exacerbation, Acute on chronic heart failure, Cellulitis, UTI , dehydration, acute resp failure with hypoxia (requiring <5L)   Social Screening:  - Has the family been directly contacted about Hospital at Home program with consent obtained (if yes- please document who was spoken to with name and phone number)? yes  -Was the family approached about the use of TOC pharmacy for medications at discharge? No  Denies significant ETOH intake? not applicable Does not smoke and understands may not smoke in the presence of oxygen? not applicable Patient states able to use iPad/phone for communication/has family who is able to use? yes Patient has agreed to be compliant with medication and treatment regimen of the program? yes Any active drug use in patient or primary caregiver including daily dosing of methadone? no Stable home environment ( access to appropriate heating in cold conditions and/or  appropriate air conditioning in hot conditions and/or no running water /electricity)? yes  No aggressive pets at home? no Firearm present? no  With ability or willingness to store them unloaded in a locked case for duration of hospitalization? not  applicable Ambulatory? yes  mild difficulty Bed bugs present on home evaluation? no Family support system in place? yes Patient feels safe at home and does not endorse any violence? yes Any actively decompensated behavioral health issues including agitation/aggressive behavior? no  Patient requests food to be provided by hospital home program? no PT/OT eval completed and not requiring SNF, ALF, inpatient rehab? yes  To be admitted to the Hospital at [facility] program, a patient generally must meet the following: 1. Requirement for Inpatient Level of Care: The patient's condition must necessitate an inpatient level of care. This is typically indicated by one or more of the following, depending on their specific diagnosis:  Persistent tachycardia despite appropriate treatment (e.g., for Heart Failure, UTI). Persistent tachypnea (rapid breathing) or dyspnea (shortness of breath) that hasn't improved sufficiently with observation care (e.g., for Heart Failure, Pneumonia, Viral Illness, COVID). Hypoxemia (low oxygen levels), such as a new need for oxygen, an increased need from baseline, or specific oxygen saturation levels (e.g., SpO2 <90-94% depending on the condition) that persist despite observation (e.g., for Heart Failure, COPD, Pneumonia, Viral Illness, COVID). Need for Intravenous (IV) hydration due to an inability to maintain oral hydration, which persists despite observation care (e.g., for Cellulitis, UTI, Viral Illness, COVID). Specific to Heart Failure: Persistent pulmonary edema, indicated by a new oxygen need, lack of improvement with IV diuretics, and ongoing tachypnea/dyspnea. Specific to COPD: A decrease in known baseline resting oxygen saturation  (SpO2) by 4% or more, or an increase in pre-existing supplemental oxygen requirements, which persists despite observation and requires continued close monitoring. Specific to Pneumonia: A Pneumonia Severity Index (PSI) class IV (moderate risk). Specific to Cellulitis: Failure of outpatient antibiotic therapy (indicated by progression or no improvement after a minimum of 48 hours on an adequate regimen) or a clinical presentation (like acuity or rapidity of progression) that requires the intensity of monitoring found in an inpatient setting. Specific to UTI: Persistence or worsening of clinical findings like fever, pain, or dehydration despite observation care; presence of significant uropathy; suspected infection of an indwelling prosthetic device, stent, implant, or graft; or pregnancy with suspected pyelonephritis.  2. Appropriateness for Hospital at Home Setting: The patient's overall clinical picture, including the severity of their illness, their care needs, and their medical history and comorbidities, must be suitable for management in the Hospital at Home environment. This essentially means that none of the exclusion criteria (listed below) are met.  Unified Exclusion Criteria for Hospital at Home Admission: A patient would not be eligible for Hospital at Home if any of the following are present: Hemodynamic Instability: Hypotension (low blood pressure) is present. Respiratory Instability or Needs Beyond Program Capability: There is a new need for invasive or noninvasive ventilatory assistance (like BiPAP or a ventilator). Oxygenation is not sufficient, generally indicated if an FiO2 (fraction of inspired oxygen) of 45% (which is about 6 Liters/minute via nasal cannula) or more is required to keep oxygen saturation (SpO2) at 90% or greater. Monitoring or Procedural Needs Beyond Program Capability: There is a need for invasive monitoring, such as a pulmonary artery catheter or an arterial  line. There is a need for immediate-response telemetry monitoring (for dangerous arrhythmia detection and subsequent immediate intervention). The required medication regimen is beyond the capabilities of Hospital at Home (e.g., dosing intervals are too frequent for home administration). There is a need for a procedure that cannot be performed by the Hospital at [facility] team (e.g., significant wound debridement or abscess drainage for cellulitis, or percutaneous  nephrostomy for a complicated UTI). Significant Organ Dysfunction or Markers of Severe Illness: Mental status is not at baseline, or there is altered mental status suggestive of inadequate perfusion. Renal (kidney) function is unstable or showing an ongoing decline. There is evidence of inadequate perfusion, such as metabolic acidosis or myocardial ischemia. Uncompensated acidosis is present. Condition-Specific Severity or Complications Making Home Care Unsuitable: For Heart Failure: Known severe cardiac valvular disease (e.g., aortic stenosis, mitral regurgitation); or severe peripheral edema that impairs the ability to urinate or ambulate. For COPD: Known concurrent comorbidity or finding that indicates a higher-risk COPD exacerbation (e.g., pulmonary fibrosis, cavitation, pleural effusion, pneumothorax, rib fracture). For Pneumonia: Pneumonia Severity Index (PSI) class V (indicating high risk for inpatient mortality); known concurrent comorbidity or finding that indicates higher-risk pneumonia (e.g., pulmonary fibrosis, cavitation, large or loculated pleural effusion); or a concomitant serious infectious process like endocarditis or empyema. For Cellulitis: Orbital, periorbital, or necrotizing infection is suspected; or a concomitant serious infectious process like endocarditis, septic emboli, or septic joint space infection. For UTI: Urinary tract obstruction (e.g., kidney stone, bladder outlet obstruction); or a concomitant serious infectious  process like endocarditis or septic emboli. For Viral Illness & COVID-19: A concomitant serious infectious process like endocarditis or empyema.  General Comorbidities or Status:  The patient is significantly immunosuppressed (this applies to Pneumonia, Cellulitis, UTI, Viral Illness, and COVID-19). The patient meets inpatient admission criteria for a second diagnosis, or has care needs beyond the capabilities of Hospital at Home due to an active clinically significant comorbidity. (This is a general exclusion across all listed conditions)   Time spent: >60 min [facility] 07/07/2024, 3:27 PM   "

## 2024-07-07 NOTE — Progress Notes (Signed)
 Mobility Specialist Progress Note:  RA During Mobility: 89-90% SPO2   07/07/24 1217  Mobility  Activity Ambulated independently  Level of Assistance Independent  Assistive Device None  Distance Ambulated (ft) 150 ft  Activity Response Tolerated well  Mobility Referral Yes  Mobility visit 1 Mobility  Mobility Specialist Start Time (ACUTE ONLY) 1145  Mobility Specialist Stop Time (ACUTE ONLY) 1155  Mobility Specialist Time Calculation (min) (ACUTE ONLY) 10 min   Pt was received by window side chair and agreed to mobility. Pt stated slight shortness of breath but continued ambulation. Returned to window side chair with all needs met. Left in room with family.  [employer] - Mobility Specialist

## 2024-07-07 NOTE — Progress Notes (Signed)
 This clinical research associate and Nurse, Marolyn, RN went to [facility] [address] [place] spoke to the licensed conveyancer, picked up the patients shadow chart. This team went to room 1611, found the patient lying on her bed, fully dressed and talking to daughter and grand daughter. The team introduced themselves to the patient and her family. Patient is on 2 liters of O2 via nasal canula. The patient stood and pivoted to the wheelchair. Patients O2 was transferred over to portable tank and set to 2 liters. The patients family took the patients personal belongings, the patient kept her pocket book in her lap during transport. Patient was secured in the wheelchair van via 2 front floor hooks, 2 rear floor hooks and lap/shoulder seatbelt. Patient was transported to her home safely, patient walked into her house. The team unloaded equipment, set up equipment and showed the patient and her family how to operate the tablet if they needed Nursing staff. This clinical research associate obtained the patients vital signs, uploaded into the vital sign tab. Patient had to use the restroom, took off her O2, came back to sit in her recliner, this writer took her O2 Stat and it read 95%. This writer kept the device on the patient while the team finished sitting things up, patient never dropped below 95%. RN, Marolyn advised the patient to leave her O2 off, we would keep a check on it. The concentrator is set up and available if patient needs O2, the family and patient was shown how to operate the machine. The patient was upset about her IV site, Nurse, Alex, cleaned around the site, removed the dried blood and redressed it for her. The patient was happy after that process. The patient gets excited easily and then calms back down and apologizes for her outburst. The team verified with the Virtual Nurse, Darice that the equipment was working properly, she called the patient on the tablet to see how it works and how to answer the calls. The patient and the family were satisfied and  happy patient is home and about the program. The team left the patients house. ----------------------------------------------------------------------------------------------------------------------- Nat SQUIBB. Franchot, EMT

## 2024-07-07 NOTE — Progress Notes (Signed)
 "  PROGRESS NOTE  [name] FMW:[id number] DOB: [birth year] DOA: 07/05/2024 PCP: Ozell Heron HERO, MD  HPI/Recap of past 24 hours: [name] is a 83 y.o. female with medical history significant for essential tremor, fibromyalgia, GERD, squamous cell carcinoma of the skin, HTN, HLD, insomnia, OSA, DVT not on o AC, knee osteoarthritis s/p left knee replacement who [facility] for evaluation of SOB + cough. Patient reports that in mid December, she was diagnosed with bronchitis and treated with azithromycin  and prednisone  and felt better, around [facility], started having increased wheezing, cough with some SOB which have progressively gotten worse since then, so she decided to come to the ED for further evaluation.  In the ED, noted to be hypoxic, around 85% on room air, other vitals fairly stable.  Labs showed proBNP 395, WBC 11.7, D-dimer 0.79, chest x-ray unremarkable, CTA chest negative for any PE/pneumonia.  Negative flu/RSV/COVID test, although found to be positive for rhinovirus/enterovirus.  Patient admitted for further management.    Early this morning, patient noted to be increasingly paranoid and agitated, refusing lab draws, oxygen.  Later today, saw patient in the room, with daughter and granddaughter.  Patient very apologetic and stated that she woke up afraid and acted out.  Patient is alert, awake, oriented x 3.  Offered my support/encouragement.  Patient denies any worsening shortness of breath, cough, denies any chest pain, fever/chills, dizziness. Introduced briefly about hospitalist at home as per Dr. Eldonna recommendation.  Patient will be transferred to the hospitalist at home care.   Assessment/Plan: Principal Problem:   Acute respiratory failure with hypoxia (HCC) Active Problems:   SOB (shortness of breath)   Rhinovirus infection   Bronchitis   Generalized weakness   Acute respiratory failure with hypoxia Rhino/entero virus infection Acute  bronchitis Requiring about 1-2 L of O2, saturating well Currently afebrile, with leukocytosis (likely reactive from steroid use) Negative flu/RSV/COVID ProBNP 395, procalcitonin negative Chest x-ray negative for pneumonia CTA chest negative for PE, or pneumonia Echo showed EF of greater than 75%, no regional wall motion abnormality, left ventricular diastolic parameters were normal Continue prednisone , DuoNeb as needed, scheduled albuterol  neb Continue supplemental O2, IS, flutter valve  Hyponatremia Improved s/p IV fluids   HTN BP stable Continue amlodipine  and atenolol    HLD Continue simvastatin    Fibromyalgia Continue on Flexeril , Lyrica  and as needed tramadol    Essential tremor Continue primidone    Insomnia Continue trazodone  as needed for sleep   Generalized weakness In the setting of acute illness PT/OT eval and treat-no PT follow-up  Class I obesity Lifestyle modification advised     Estimated body mass index is 30.5 kg/m as calculated from the following:   Height as of this encounter: 5' 3 (1.6 m).   Weight as of this encounter: 78.1 kg.     Code Status: Full  Family Communication: Daughter and granddaughter at bedside  Disposition Plan: Status is: Inpatient       Consultants: None  Procedures: None  Antimicrobials: None  DVT prophylaxis: Lovenox    Objective: Vitals:   07/06/24 2212 07/07/24 0606 07/07/24 0720 07/07/24 0743  BP:  (!) 143/77    Pulse:  88    Resp: 14 18    Temp:  98.5 F (36.9 C) 98.3 F (36.8 C)   TempSrc:   Oral   SpO2:  96%  96%  Weight:      Height:        Intake/Output Summary (Last 24  hours) at 07/07/2024 1304 Last data filed at 07/07/2024 0910 Gross per 24 hour  Intake 1310.66 ml  Output --  Net 1310.66 ml   Filed Weights   07/05/24 1836  Weight: 78.1 kg    Exam: General: NAD  Cardiovascular: S1, S2 present Respiratory: Diminished breath sounds bilaterally Abdomen: Soft, nontender,  nondistended, bowel sounds present Musculoskeletal: No bilateral pedal edema noted Skin: Normal Psychiatry: Normal mood     Data Reviewed: CBC: Recent Labs  Lab 07/05/24 1003 07/06/24 0656 07/07/24 1122  WBC 11.7* 11.2* 21.5*  HGB 12.3 11.5* 12.4  HCT 35.9* 33.7* 36.9  MCV 97.8 99.1 99.7  PLT 194 183 246   Basic Metabolic Panel: Recent Labs  Lab 07/05/24 1003 07/06/24 0656 07/07/24 1122  NA 134* 133* 137  K 3.8 4.1 4.3  CL 95* 96* 99  CO2 28 24 25   GLUCOSE 120* 185* 122*  BUN 11 13 13   CREATININE 0.94 0.87 0.92  CALCIUM 9.3 9.2 9.7   GFR: Estimated Creatinine Clearance: 45.1 mL/min (by C-G formula based on SCr of 0.92 mg/dL). Liver Function Tests: No results for input(s): AST, ALT, ALKPHOS, BILITOT, PROT, ALBUMIN in the last 168 hours. No results for input(s): LIPASE, AMYLASE in the last 168 hours. No results for input(s): AMMONIA in the last 168 hours. Coagulation Profile: No results for input(s): INR, PROTIME in the last 168 hours. Cardiac Enzymes: No results for input(s): CKTOTAL, CKMB, CKMBINDEX, TROPONINI in the last 168 hours. BNP (last 3 results) Recent Labs    07/05/24 1003  PROBNP 395.0*   HbA1C: No results for input(s): HGBA1C in the last 72 hours. CBG: No results for input(s): GLUCAP in the last 168 hours. Lipid Profile: No results for input(s): CHOL, HDL, LDLCALC, TRIG, CHOLHDL, LDLDIRECT in the last 72 hours. Thyroid Function Tests: No results for input(s): TSH, T4TOTAL, FREET4, T3FREE, THYROIDAB in the last 72 hours. Anemia Panel: No results for input(s): VITAMINB12, FOLATE, FERRITIN, TIBC, IRON, RETICCTPCT in the last 72 hours. Urine analysis:    Component Value Date/Time   COLORURINE YELLOW 01/21/2021 2046   APPEARANCEUR CLEAR 01/21/2021 2046   LABSPEC 1.020 01/21/2021 2046   PHURINE 5.0 01/21/2021 2046   GLUCOSEU NEGATIVE 01/21/2021 2046   HGBUR MODERATE (A)  01/21/2021 2046   BILIRUBINUR NEGATIVE 01/21/2021 2046   KETONESUR 80 (A) 01/21/2021 2046   PROTEINUR 100 (A) 01/21/2021 2046   NITRITE NEGATIVE 01/21/2021 2046   LEUKOCYTESUR NEGATIVE 01/21/2021 2046   Sepsis Labs: @LABRCNTIP (procalcitonin:4,lacticidven:4)  ) Recent Results (from the past 240 hours)  Resp panel by RT-PCR (RSV, Flu A&B, Covid) Anterior Nasal Swab     Status: None   Collection Time: 07/05/24 10:53 AM   Specimen: Anterior Nasal Swab  Result Value Ref Range Status   SARS Coronavirus 2 by RT PCR NEGATIVE NEGATIVE Final    Comment: (NOTE) SARS-CoV-2 target nucleic acids are NOT DETECTED.  The SARS-CoV-2 RNA is generally detectable in upper respiratory specimens during the acute phase of infection. The lowest concentration of SARS-CoV-2 viral copies this assay can detect is 138 copies/mL. A negative result does not preclude SARS-Cov-2 infection and should not be used as the sole basis for treatment or other patient management decisions. A negative result may occur with  improper specimen collection/handling, submission of specimen other than nasopharyngeal swab, presence of viral mutation(s) within the areas targeted by this assay, and inadequate number of viral copies(<138 copies/mL). A negative result must be combined with clinical observations, patient history, and epidemiological information. The expected  result is Negative.  Fact Sheet for Patients:  bloggercourse.com  Fact Sheet for Healthcare Providers:  seriousbroker.it  This test is no t yet approved or cleared by the [place]  FDA and  has been authorized for detection and/or diagnosis of SARS-CoV-2 by FDA under an Emergency Use Authorization (EUA). This EUA will remain  in effect (meaning this test can be used) for the duration of the COVID-19 declaration under Section 564(b)(1) of the Act, 21 U.S.C.section 360bbb-3(b)(1), unless the authorization  is terminated  or revoked sooner.       Influenza A by PCR NEGATIVE NEGATIVE Final   Influenza B by PCR NEGATIVE NEGATIVE Final    Comment: (NOTE) The Xpert Xpress SARS-CoV-2/FLU/RSV plus assay is intended as an aid in the diagnosis of influenza from Nasopharyngeal swab specimens and should not be used as a sole basis for treatment. Nasal washings and aspirates are unacceptable for Xpert Xpress SARS-CoV-2/FLU/RSV testing.  Fact Sheet for Patients: bloggercourse.com  Fact Sheet for Healthcare Providers: seriousbroker.it  This test is not yet approved or cleared by the [place]  FDA and has been authorized for detection and/or diagnosis of SARS-CoV-2 by FDA under an Emergency Use Authorization (EUA). This EUA will remain in effect (meaning this test can be used) for the duration of the COVID-19 declaration under Section 564(b)(1) of the Act, 21 U.S.C. section 360bbb-3(b)(1), unless the authorization is terminated or revoked.     Resp Syncytial Virus by PCR NEGATIVE NEGATIVE Final    Comment: (NOTE) Fact Sheet for Patients: bloggercourse.com  Fact Sheet for Healthcare Providers: seriousbroker.it  This test is not yet approved or cleared by the [place]  FDA and has been authorized for detection and/or diagnosis of SARS-CoV-2 by FDA under an Emergency Use Authorization (EUA). This EUA will remain in effect (meaning this test can be used) for the duration of the COVID-19 declaration under Section 564(b)(1) of the Act, 21 U.S.C. section 360bbb-3(b)(1), unless the authorization is terminated or revoked.  Performed at [facility], [address] [place], [place] [address]   Respiratory (~20 pathogens) panel by PCR     Status: Abnormal   Collection Time: 07/05/24 10:53 AM   Specimen: Nasopharyngeal Swab; Respiratory  Result Value Ref Range Status    Adenovirus NOT DETECTED NOT DETECTED Final   Coronavirus 229E NOT DETECTED NOT DETECTED Final    Comment: (NOTE) The Coronavirus on the Respiratory Panel, DOES NOT test for the novel  Coronavirus (2019 nCoV)    Coronavirus HKU1 NOT DETECTED NOT DETECTED Final   Coronavirus NL63 NOT DETECTED NOT DETECTED Final   Coronavirus OC43 NOT DETECTED NOT DETECTED Final   Metapneumovirus NOT DETECTED NOT DETECTED Final   Rhinovirus / Enterovirus DETECTED (A) NOT DETECTED Final   Influenza A NOT DETECTED NOT DETECTED Final   Influenza B NOT DETECTED NOT DETECTED Final   Parainfluenza Virus 1 NOT DETECTED NOT DETECTED Final   Parainfluenza Virus 2 NOT DETECTED NOT DETECTED Final   Parainfluenza Virus 3 NOT DETECTED NOT DETECTED Final   Parainfluenza Virus 4 NOT DETECTED NOT DETECTED Final   Respiratory Syncytial Virus NOT DETECTED NOT DETECTED Final   Bordetella pertussis NOT DETECTED NOT DETECTED Final   Bordetella Parapertussis NOT DETECTED NOT DETECTED Final   Chlamydophila pneumoniae NOT DETECTED NOT DETECTED Final   Mycoplasma pneumoniae NOT DETECTED NOT DETECTED Final    Comment: Performed at [facility], [address] [place], [place] [address]  Blood culture (routine x 2)  Status: None (Preliminary result)   Collection Time: 07/05/24  4:55 PM   Specimen: BLOOD  Result Value Ref Range Status   Specimen Description   Final    BLOOD LEFT ANTECUBITAL Performed at [facility], [address], [place], [place] [address]    Special Requests   Final    BOTTLES DRAWN AEROBIC AND ANAEROBIC Blood Culture adequate volume Performed at [facility], [address] [place], [place] [address]    Culture   Final    NO GROWTH 2 DAYS Performed at [facility], [address] [place], [place] [address]    Report Status PENDING  Incomplete  Blood culture (routine x 2)     Status: None (Preliminary result)   Collection Time: 07/05/24  4:56 PM    Specimen: BLOOD  Result Value Ref Range Status   Specimen Description   Final    BLOOD RIGHT ANTECUBITAL Performed at [facility], [address], [place], [place] [address]    Special Requests   Final    BOTTLES DRAWN AEROBIC AND ANAEROBIC Blood Culture adequate volume Performed at [facility], [address], [place], [place] [address]    Culture   Final    NO GROWTH 2 DAYS Performed at [facility], [address] [place], [place] [address]    Report Status PENDING  Incomplete      Studies: ECHOCARDIOGRAM COMPLETE Result Date: 07/06/2024    ECHOCARDIOGRAM REPORT   Patient Name:   [name] [facility] Date of Exam: 07/06/2024 Medical Rec #:  [id number]    Height:       63.0 in Accession #:    [id number]   Weight:       172.2 lb Date of Birth:  [birth year]    BSA:          1.814 m Patient Age:    83 years     BP:           135/105 mmHg Patient Gender: F            HR:           114 bpm. Exam Location:  Inpatient Procedure: 2D Echo, Color Doppler and Cardiac Doppler (Both Spectral and Color            Flow Doppler were utilized during procedure). Indications:    Dyspnea R06.00  History:        Patient has no prior history of Echocardiogram examinations.                 Signs/Symptoms:Murmur and Hypertensive Heart Disease.  Sonographer:    Sydnee Wilson RDCS Referring Phys: [id number] Paetyn Pietrzak J Cashe Gatt IMPRESSIONS  1. Left ventricular ejection fraction, by estimation, is >75%. The left ventricle has hyperdynamic function. The left ventricle has no regional wall motion abnormalities. Left ventricular diastolic parameters were normal.  2. Right ventricular systolic function is normal. The right ventricular size is normal.  3. The mitral valve is normal in structure. No evidence of mitral valve regurgitation. No evidence of mitral stenosis.  4. The aortic valve is normal in structure. Aortic valve regurgitation is not visualized. No aortic stenosis is  present.  5. The inferior vena cava is normal in size with greater than 50% respiratory variability, suggesting right atrial pressure of 3 mmHg. FINDINGS  Left Ventricle: Left ventricular ejection fraction, by estimation, is >75%. The left ventricle has hyperdynamic function. The left ventricle has no regional wall motion  abnormalities. The left ventricular internal cavity size was normal in size. There is no left ventricular hypertrophy. Left ventricular diastolic parameters were normal. Right Ventricle: The right ventricular size is normal. No increase in right ventricular wall thickness. Right ventricular systolic function is normal. Left Atrium: Left atrial size was normal in size. Right Atrium: Right atrial size was normal in size. Pericardium: There is no evidence of pericardial effusion. Mitral Valve: The mitral valve is normal in structure. No evidence of mitral valve regurgitation. No evidence of mitral valve stenosis. Tricuspid Valve: The tricuspid valve is normal in structure. Tricuspid valve regurgitation is not demonstrated. No evidence of tricuspid stenosis. Aortic Valve: The aortic valve is normal in structure. Aortic valve regurgitation is not visualized. No aortic stenosis is present. Aortic valve mean gradient measures 8.0 mmHg. Aortic valve peak gradient measures 12.5 mmHg. Aortic valve area, by VTI measures 2.73 cm. Pulmonic Valve: The pulmonic valve was normal in structure. Pulmonic valve regurgitation is not visualized. No evidence of pulmonic stenosis. Aorta: The aortic root is normal in size and structure. Venous: The inferior vena cava is normal in size with greater than 50% respiratory variability, suggesting right atrial pressure of 3 mmHg. IAS/Shunts: No atrial level shunt detected by color flow Doppler.  LEFT VENTRICLE PLAX 2D LVIDd:         3.60 cm     Diastology LVIDs:         2.40 cm     LV e' medial:    12.00 cm/s LV PW:         1.00 cm     LV E/e' medial:  9.2 LV IVS:        0.80  cm     LV e' lateral:   14.00 cm/s LVOT diam:     2.00 cm     LV E/e' lateral: 7.9 LV SV:         90 LV SV Index:   50 LVOT Area:     3.14 cm  LV Volumes (MOD) LV vol d, MOD A2C: 89.0 ml LV vol d, MOD A4C: 60.6 ml LV vol s, MOD A2C: 28.6 ml LV vol s, MOD A4C: 26.0 ml LV SV MOD A2C:     60.4 ml LV SV MOD A4C:     60.6 ml LV SV MOD BP:      46.9 ml RIGHT VENTRICLE TAPSE (M-mode): 1.8 cm LEFT ATRIUM             Index        RIGHT ATRIUM           Index LA diam:        4.20 cm 2.31 cm/m   RA Area:     12.30 cm LA Vol (A2C):   64.3 ml 35.44 ml/m  RA Volume:   24.10 ml  13.28 ml/m LA Vol (A4C):   49.0 ml 27.01 ml/m LA Biplane Vol: 57.5 ml 31.69 ml/m  AORTIC VALVE AV Area (Vmax):    2.57 cm AV Area (Vmean):   2.46 cm AV Area (VTI):     2.73 cm AV Vmax:           177.00 cm/s AV Vmean:          133.000 cm/s AV VTI:            0.329 m AV Peak Grad:      12.5 mmHg AV Mean Grad:      8.0 mmHg LVOT Vmax:  145.00 cm/s LVOT Vmean:        104.000 cm/s LVOT VTI:          0.286 m LVOT/AV VTI ratio: 0.87  AORTA Ao Root diam: 2.90 cm MITRAL VALVE                TRICUSPID VALVE MV Area (PHT): 2.50 cm     TR Peak grad:   55.1 mmHg MV E velocity: 110.00 cm/s  TR Vmax:        371.00 cm/s MV A velocity: 93.00 cm/s MV E/A ratio:  1.18         SHUNTS                             Systemic VTI:  0.29 m                             Systemic Diam: 2.00 cm Oneil Parchment MD Electronically signed by Oneil Parchment MD Signature Date/Time: 07/06/2024/4:12:43 PM    Final     Scheduled Meds:  albuterol   2.5 mg Nebulization TID   amLODipine   5 mg Oral Daily   atenolol   25 mg Oral Daily   benzonatate   200 mg Oral TID   cyclobenzaprine   10 mg Oral QHS   predniSONE   40 mg Oral Q breakfast   pregabalin   75 mg Oral BID   primidone   50 mg Oral BID   simvastatin   40 mg Oral QHS    Continuous Infusions:     LOS: 1 day     Lebron JINNY Cage, MD [facility]  If 7PM-7AM, please contact  night-coverage [email or web address] 07/07/2024, 1:04 PM    "

## 2024-07-07 NOTE — Assessment & Plan Note (Signed)
 Continue as needed trazodone  at home

## 2024-07-07 NOTE — Progress Notes (Signed)
 This RN noted patient to be increasingly paranoid and agitated this morning, which is outside of established baseline for this patient. This nurse was in the room approximately one hour prior administering medications without issue. At approximately 0630, patient became distrustful of care. Patient has refused lab draws, tele monitor reconnection, and supplemental oxygen. Patient remains oriented to person, place, and situation and is able to verbalize understanding of why she is hospitalized. This nurse made multiple attempts to reorient patient and provide care, however patient continued to refuse. On call provider - J. Blondie NP - notified of change of behavior and refusals. Will continue to monitor patient closely and attempt care as tolerated. Will pass on change of status to day shift RN.

## 2024-07-07 NOTE — Assessment & Plan Note (Signed)
 Baseline, chronic Continue home pregabalin  75 mg BID, cyclobenzaprine  10 mg at bedtime, tramadol  50 mg q8h prn for severe pain PDMP reviewed

## 2024-07-07 NOTE — Assessment & Plan Note (Signed)
 Viral bronchitis  Rhinovirus/Enterovirus  Decompensated resp status now requiring 1-2 L to keep O2 sats >92% CTA chest negative for any acute focal infiltrate though w/ severe 3 vessel coronary disease  Resp panel + for rhinovirus/enterovirus  Started on steroid regimen, duonebs and supplemental O2 for treatment  Clinically improving though still mildly symptomatic  Will continue treatment plan for now  Supplemental O2 prn  Reassess as appropriate

## 2024-07-07 NOTE — Assessment & Plan Note (Signed)
 Continue home primidone  50 mg daily

## 2024-07-07 NOTE — Plan of Care (Signed)
  Problem: Clinical Measurements: Goal: Will remain free from infection Outcome: Progressing Goal: Respiratory complications will improve Outcome: Progressing Goal: Cardiovascular complication will be avoided Outcome: Progressing   Problem: Activity: Goal: Risk for activity intolerance will decrease Outcome: Progressing   

## 2024-07-07 NOTE — Assessment & Plan Note (Signed)
 Pt refusing CPAP in the outpatient setting

## 2024-07-07 NOTE — Assessment & Plan Note (Signed)
 Rresolved (Na 133-->134--137)  Treated with IVF

## 2024-07-08 LAB — BASIC METABOLIC PANEL WITH GFR
Anion gap: 12 (ref 5–15)
BUN: 11 mg/dL (ref 8–23)
CO2: 25 mmol/L (ref 22–32)
Calcium: 9.1 mg/dL (ref 8.9–10.3)
Chloride: 98 mmol/L (ref 98–111)
Creatinine, Ser: 0.84 mg/dL (ref 0.44–1.00)
GFR, Estimated: >60 mL/min
Glucose, Bld: 96 mg/dL (ref 70–99)
Potassium: 3.7 mmol/L (ref 3.5–5.1)
Sodium: 135 mmol/L (ref 135–145)

## 2024-07-08 LAB — CBC
HCT: 37.3 % (ref 36.0–46.0)
Hemoglobin: 12.5 g/dL (ref 12.0–15.0)
MCH: 33.9 pg (ref 26.0–34.0)
MCHC: 33.5 g/dL (ref 30.0–36.0)
MCV: 101.1 fL — ABNORMAL HIGH (ref 80.0–100.0)
Platelets: 219 K/uL (ref 150–400)
RBC: 3.69 MIL/uL — ABNORMAL LOW (ref 3.87–5.11)
RDW: 13.9 % (ref 11.5–15.5)
WBC: 9.4 K/uL (ref 4.0–10.5)
nRBC: 0 % (ref 0.0–0.2)

## 2024-07-08 MED ORDER — ATENOLOL 25 MG PO TABS
25.0000 mg | ORAL_TABLET | Freq: Every day | ORAL | Status: DC
  Administered 2024-07-08: 25 mg via ORAL
  Filled 2024-07-08 (×2): qty 1

## 2024-07-08 MED ORDER — DM-GUAIFENESIN ER 30-600 MG PO TB12
1.0000 | ORAL_TABLET | Freq: Two times a day (BID) | ORAL | Status: DC
  Administered 2024-07-08 – 2024-07-09 (×3): 1 via ORAL
  Filled 2024-07-08 (×5): qty 1

## 2024-07-08 MED ORDER — BENZONATATE 100 MG PO CAPS
200.0000 mg | ORAL_CAPSULE | Freq: Three times a day (TID) | ORAL | Status: DC | PRN
  Administered 2024-07-08: 200 mg via ORAL
  Filled 2024-07-08 (×5): qty 2

## 2024-07-08 MED ORDER — LOPERAMIDE HCL 2 MG PO CAPS
2.0000 mg | ORAL_CAPSULE | ORAL | Status: DC | PRN
  Administered 2024-07-08: 2 mg via ORAL
  Filled 2024-07-08 (×10): qty 1

## 2024-07-08 NOTE — Progress Notes (Signed)
 Virtual call with Ms Ravenscroft. She has had a discouraging night. She has been bothered by a lingering cough and persistent diarrhea. Her bottom is very sore where she is excoriated and she had an incontinent urine episode in her bed, which is not usual for her.

## 2024-07-08 NOTE — Discharge Summary (Incomplete)
 "  Hospital at home Physician Discharge Summary   Patient: [name] MRN: [id number] DOB: Jun 04, 1941  Admit date:     07/05/2024  Discharge date: 07/09/2024  Discharge Physician: Burnard DELENA Cunning   PCP: Ozell Heron HERO, MD   Recommendations at discharge:  {Tip this will not be part of the note when signed- Example include specific recommendations for outpatient follow-up, pending tests to follow-up on. (Optional):26781}  Follow-up with primary care in 1 to 2 weeks Repeat CBC, CMP at follow-up   Discharge Diagnoses: Principal Problem:   Acute respiratory failure with hypoxia (HCC) Active Problems:   Bronchitis   Fibromyalgia syndrome   Primary insomnia   Essential tremor   OSA (obstructive sleep apnea)   SOB (shortness of breath)   Rhinovirus infection   Generalized weakness   Hyponatremia  Resolved Problems:   * No resolved hospital problems. *  Hospital Course: [name] is a 83 y.o. female with medical history significant for essential tremor, fibromyalgia, GERD, squamous cell carcinoma of the skin, HTN, HLD, insomnia, OSA, DVT not on o AC, knee osteoarthritis s/p left knee replacement who [facility] for evaluation of SOB + cough. Patient reports that in mid December, she was diagnosed with bronchitis and treated with azithromycin  and prednisone  and felt better, around [facility], started having increased wheezing, cough with some SOB which have progressively gotten worse since then, so she decided to come to the ED for further evaluation.  In the ED, noted to be hypoxic, around 85% on room air, other vitals fairly stable.  Labs showed proBNP 395, WBC 11.7, D-dimer 0.79, chest x-ray unremarkable, CTA chest negative for any PE/pneumonia.  Negative flu/RSV/COVID test, although found to be positive for rhinovirus/enterovirus.  Patient admitted on 07/05/2024 for further management as outlined in detail below.  Significant events -  12/29 -  transferred to [facility] program.  Stable on room air.    12/30 - cough & diarrhea overnight.  Remains on room air.  WBC normalized. Pt having some suspected steroid-induced anxiety / restlessness / paranoia.  Similar to what was described in hospital, pt was uncooperative with AM Medic visit this AM.  Her daughter is able to reason and patient was pleasant &  cooperative later in the day.  Assessment and Plan: * Acute respiratory failure with hypoxia (HCC) Viral bronchitis  Rhinovirus/Enterovirus  Decompensated resp status now requiring 1-2 L to keep O2 sats >92% CTA chest negative for any acute focal infiltrate though w/ severe 3 vessel coronary disease  Resp panel + for rhinovirus/enterovirus  Started on steroid regimen, duonebs and supplemental O2 for treatment  Clinically improving though still mildly symptomatic  --Continue above management --Supplemental O2 prn  12/30 - remains stable on RA. Ongoing coughing  Bronchitis Confirmed +Rhinovirus / Enterovirus  Hyponatremia Rresolved (Na 133-->134--137)  Treated with IVF  Generalized weakness Seen by PT in hospital with no follow up recommended  Rhinovirus infection .  OSA (obstructive sleep apnea) Pt refusing CPAP in the outpatient setting    Essential tremor Cont home primidone    Primary insomnia Cont prn ambien   Follow   Fibromyalgia syndrome Baseline chronic fibromyalgia  On multiple medications including Flexeril , Lyrica  and as needed tramadol   Cont home regimen  Monitor        {Tip this will not be part of the note when signed Body mass index is 30.5 kg/m. , ,  (Optional):26781}  {(NOTE) Pain control PDMP Statment (Optional):26782} Consultants: None  Procedures performed: None Disposition: Home Diet recommendation:  Regular diet   DISCHARGE MEDICATION: Allergies as of 07/08/2024       Reactions   Propoxyphene Other (See Comments)   Shellfish Allergy Itching   Severe itching after  mussels   Erythromycin Base Other (See Comments)   Hctz [hydrochlorothiazide] Other (See Comments)   Hyponatremia    Lisinopril Other (See Comments)   Hyponatremia   Sulfa Antibiotics Rash     Med Rec must be completed prior to using this [facility]***       Discharge Exam: Filed Weights   07/05/24 1836  Weight: 78.1 kg   ***  Condition at discharge: stable  The results of significant diagnostics from this hospitalization (including imaging, microbiology, ancillary and laboratory) are listed below for reference.   Imaging Studies: ECHOCARDIOGRAM COMPLETE Result Date: 07/06/2024    ECHOCARDIOGRAM REPORT   Patient Name:   [name] Date of Exam: 07/06/2024 Medical Rec #:  [id number]    Height:       63.0 in Accession #:    [id number]   Weight:       172.2 lb Date of Birth:  [birth year]    BSA:          1.814 m Patient Age:    83 years     BP:           135/105 mmHg Patient Gender: F            HR:           114 bpm. Exam Location:  Inpatient Procedure: 2D Echo, Color Doppler and Cardiac Doppler (Both Spectral and Color            Flow Doppler were utilized during procedure). Indications:    Dyspnea R06.00  History:        Patient has no prior history of Echocardiogram examinations.                 Signs/Symptoms:Murmur and Hypertensive Heart Disease.  Sonographer:    Sydnee Wilson RDCS Referring Phys: [id number] NKEIRUKA J EZENDUKA IMPRESSIONS  1. Left ventricular ejection fraction, by estimation, is >75%. The left ventricle has hyperdynamic function. The left ventricle has no regional wall motion abnormalities. Left ventricular diastolic parameters were normal.  2. Right ventricular systolic function is normal. The right ventricular size is normal.  3. The mitral valve is normal in structure. No evidence of mitral valve regurgitation. No evidence of mitral stenosis.  4. The aortic valve is normal in structure. Aortic valve regurgitation is not visualized. No aortic stenosis is present.  5.  The inferior vena cava is normal in size with greater than 50% respiratory variability, suggesting right atrial pressure of 3 mmHg. FINDINGS  Left Ventricle: Left ventricular ejection fraction, by estimation, is >75%. The left ventricle has hyperdynamic function. The left ventricle has no regional wall motion abnormalities. The left ventricular internal cavity size was normal in size. There is no left ventricular hypertrophy. Left ventricular diastolic parameters were normal. Right Ventricle: The right ventricular size is normal. No increase in right ventricular wall thickness. Right ventricular systolic function is normal. Left Atrium: Left atrial size was normal in size. Right Atrium: Right atrial size was normal in size. Pericardium: There is no evidence of pericardial effusion. Mitral Valve: The mitral valve is normal in structure. No evidence of mitral valve regurgitation. No evidence of mitral valve stenosis. Tricuspid Valve: The tricuspid valve is normal in structure. Tricuspid valve regurgitation is not demonstrated.  No evidence of tricuspid stenosis. Aortic Valve: The aortic valve is normal in structure. Aortic valve regurgitation is not visualized. No aortic stenosis is present. Aortic valve mean gradient measures 8.0 mmHg. Aortic valve peak gradient measures 12.5 mmHg. Aortic valve area, by VTI measures 2.73 cm. Pulmonic Valve: The pulmonic valve was normal in structure. Pulmonic valve regurgitation is not visualized. No evidence of pulmonic stenosis. Aorta: The aortic root is normal in size and structure. Venous: The inferior vena cava is normal in size with greater than 50% respiratory variability, suggesting right atrial pressure of 3 mmHg. IAS/Shunts: No atrial level shunt detected by color flow Doppler.  LEFT VENTRICLE PLAX 2D LVIDd:         3.60 cm     Diastology LVIDs:         2.40 cm     LV e' medial:    12.00 cm/s LV PW:         1.00 cm     LV E/e' medial:  9.2 LV IVS:        0.80 cm     LV e'  lateral:   14.00 cm/s LVOT diam:     2.00 cm     LV E/e' lateral: 7.9 LV SV:         90 LV SV Index:   50 LVOT Area:     3.14 cm  LV Volumes (MOD) LV vol d, MOD A2C: 89.0 ml LV vol d, MOD A4C: 60.6 ml LV vol s, MOD A2C: 28.6 ml LV vol s, MOD A4C: 26.0 ml LV SV MOD A2C:     60.4 ml LV SV MOD A4C:     60.6 ml LV SV MOD BP:      46.9 ml RIGHT VENTRICLE TAPSE (M-mode): 1.8 cm LEFT ATRIUM             Index        RIGHT ATRIUM           Index LA diam:        4.20 cm 2.31 cm/m   RA Area:     12.30 cm LA Vol (A2C):   64.3 ml 35.44 ml/m  RA Volume:   24.10 ml  13.28 ml/m LA Vol (A4C):   49.0 ml 27.01 ml/m LA Biplane Vol: 57.5 ml 31.69 ml/m  AORTIC VALVE AV Area (Vmax):    2.57 cm AV Area (Vmean):   2.46 cm AV Area (VTI):     2.73 cm AV Vmax:           177.00 cm/s AV Vmean:          133.000 cm/s AV VTI:            0.329 m AV Peak Grad:      12.5 mmHg AV Mean Grad:      8.0 mmHg LVOT Vmax:         145.00 cm/s LVOT Vmean:        104.000 cm/s LVOT VTI:          0.286 m LVOT/AV VTI ratio: 0.87  AORTA Ao Root diam: 2.90 cm MITRAL VALVE                TRICUSPID VALVE MV Area (PHT): 2.50 cm     TR Peak grad:   55.1 mmHg MV E velocity: 110.00 cm/s  TR Vmax:        371.00 cm/s MV A velocity: 93.00 cm/s MV E/A ratio:  1.18  SHUNTS                             Systemic VTI:  0.29 m                             Systemic Diam: 2.00 cm Oneil Parchment MD Electronically signed by Oneil Parchment MD Signature Date/Time: 07/06/2024/4:12:43 PM    Final    CT Angio Chest PE W and/or Wo Contrast Result Date: 07/05/2024 EXAM: CTA CHEST 07/05/2024 12:49:26 PM TECHNIQUE: CTA of the chest was performed without and with the administration of 75 mL of intravenous iohexol  (OMNIPAQUE ) 350 MG/ML injection. Multiplanar reformatted images are provided for review. MIP images are provided for review. Automated exposure control, iterative reconstruction, and/or weight based adjustment of the mA/kV was utilized to reduce the radiation dose to as  low as reasonably achievable. COMPARISON: CT angio chest 03/16/23, CT abdomen and pelvis 01/22/2021 CLINICAL HISTORY: Pulmonary embolism (PE) suspected, low to intermediate prob, positive D-dimer; Shortness of breath and new oxygen requirement. Also with cough, eval for pneumonia. FINDINGS: PULMONARY ARTERIES: Pulmonary arteries are adequately opacified for evaluation. No acute pulmonary embolus. Main pulmonary artery is normal in caliber. MEDIASTINUM: The heart demonstrates at least 3-vessel coronary artery calcifications. The pericardium demonstrates no acute abnormality. There is severe atherosclerotic plaque of the thoracic aorta. LYMPH NODES: No mediastinal, hilar or axillary lymphadenopathy. LUNGS AND PLEURA: Diffuse mild bronchial wall thickening. No focal consolidation or pulmonary edema. No evidence of pleural effusion or pneumothorax. UPPER ABDOMEN: Subcentimeter calcifications within the hepatic parenchyma are nonspecific. SOFT TISSUES AND BONES: Multilevel moderate degenerative changes of the spine. Sclerotic lesion within the T3 vertebral body, likely a bone island. No acute soft tissue abnormality. IMPRESSION: 1. No pulmonary embolism. 2. No acute pulmonary abnormality. 3. Severe atherosclerotic plaque with at least 3-vessel coronary artery calcifications. Electronically signed by: Morgane Naveau MD 07/05/2024 01:55 PM EST RP Workstation: [id number]   DG Chest 2 View Result Date: 07/05/2024 CLINICAL DATA:  Shortness of breath. EXAM: CHEST - 2 VIEW COMPARISON:  01/01/2024 FINDINGS: The heart size and mediastinal contours are within normal limits. Both lungs are clear. The visualized skeletal structures are unremarkable. IMPRESSION: No active cardiopulmonary disease. Electronically Signed   By: Camellia Candle M.D.   On: 07/05/2024 11:29    Microbiology: Results for orders placed or performed during the hospital encounter of 07/05/24  Resp panel by RT-PCR (RSV, Flu A&B, Covid) Anterior Nasal Swab      Status: None   Collection Time: 07/05/24 10:53 AM   Specimen: Anterior Nasal Swab  Result Value Ref Range Status   SARS Coronavirus 2 by RT PCR NEGATIVE NEGATIVE Final    Comment: (NOTE) SARS-CoV-2 target nucleic acids are NOT DETECTED.  The SARS-CoV-2 RNA is generally detectable in upper respiratory specimens during the acute phase of infection. The lowest concentration of SARS-CoV-2 viral copies this assay can detect is 138 copies/mL. A negative result does not preclude SARS-Cov-2 infection and should not be used as the sole basis for treatment or other patient management decisions. A negative result may occur with  improper specimen collection/handling, submission of specimen other than nasopharyngeal swab, presence of viral mutation(s) within the areas targeted by this assay, and inadequate number of viral copies(<138 copies/mL). A negative result must be combined with clinical observations, patient history, and epidemiological information. The expected result is Negative.  Fact Sheet for Patients:  bloggercourse.com  Fact Sheet for Healthcare Providers:  seriousbroker.it  This test is no t yet approved or cleared by the [place]  FDA and  has been authorized for detection and/or diagnosis of SARS-CoV-2 by FDA under an Emergency Use Authorization (EUA). This EUA will remain  in effect (meaning this test can be used) for the duration of the COVID-19 declaration under Section 564(b)(1) of the Act, 21 U.S.C.section 360bbb-3(b)(1), unless the authorization is terminated  or revoked sooner.       Influenza A by PCR NEGATIVE NEGATIVE Final   Influenza B by PCR NEGATIVE NEGATIVE Final    Comment: (NOTE) The Xpert Xpress SARS-CoV-2/FLU/RSV plus assay is intended as an aid in the diagnosis of influenza from Nasopharyngeal swab specimens and should not be used as a sole basis for treatment. Nasal washings and aspirates are  unacceptable for Xpert Xpress SARS-CoV-2/FLU/RSV testing.  Fact Sheet for Patients: bloggercourse.com  Fact Sheet for Healthcare Providers: seriousbroker.it  This test is not yet approved or cleared by the [place]  FDA and has been authorized for detection and/or diagnosis of SARS-CoV-2 by FDA under an Emergency Use Authorization (EUA). This EUA will remain in effect (meaning this test can be used) for the duration of the COVID-19 declaration under Section 564(b)(1) of the Act, 21 U.S.C. section 360bbb-3(b)(1), unless the authorization is terminated or revoked.     Resp Syncytial Virus by PCR NEGATIVE NEGATIVE Final    Comment: (NOTE) Fact Sheet for Patients: bloggercourse.com  Fact Sheet for Healthcare Providers: seriousbroker.it  This test is not yet approved or cleared by the [place]  FDA and has been authorized for detection and/or diagnosis of SARS-CoV-2 by FDA under an Emergency Use Authorization (EUA). This EUA will remain in effect (meaning this test can be used) for the duration of the COVID-19 declaration under Section 564(b)(1) of the Act, 21 U.S.C. section 360bbb-3(b)(1), unless the authorization is terminated or revoked.  Performed at [facility], [address], [place], [place] [address]   Respiratory (~20 pathogens) panel by PCR     Status: Abnormal   Collection Time: 07/05/24 10:53 AM   Specimen: Nasopharyngeal Swab; Respiratory  Result Value Ref Range Status   Adenovirus NOT DETECTED NOT DETECTED Final   Coronavirus 229E NOT DETECTED NOT DETECTED Final    Comment: (NOTE) The Coronavirus on the Respiratory Panel, DOES NOT test for the novel  Coronavirus (2019 nCoV)    Coronavirus HKU1 NOT DETECTED NOT DETECTED Final   Coronavirus NL63 NOT DETECTED NOT DETECTED Final   Coronavirus OC43 NOT DETECTED NOT DETECTED Final    Metapneumovirus NOT DETECTED NOT DETECTED Final   Rhinovirus / Enterovirus DETECTED (A) NOT DETECTED Final   Influenza A NOT DETECTED NOT DETECTED Final   Influenza B NOT DETECTED NOT DETECTED Final   Parainfluenza Virus 1 NOT DETECTED NOT DETECTED Final   Parainfluenza Virus 2 NOT DETECTED NOT DETECTED Final   Parainfluenza Virus 3 NOT DETECTED NOT DETECTED Final   Parainfluenza Virus 4 NOT DETECTED NOT DETECTED Final   Respiratory Syncytial Virus NOT DETECTED NOT DETECTED Final   Bordetella pertussis NOT DETECTED NOT DETECTED Final   Bordetella Parapertussis NOT DETECTED NOT DETECTED Final   Chlamydophila pneumoniae NOT DETECTED NOT DETECTED Final   Mycoplasma pneumoniae NOT DETECTED NOT DETECTED Final    Comment: Performed at [facility], [address] [place], [place] [address]  Blood culture (routine x 2)     Status: None (Preliminary result)   Collection Time: 07/05/24  4:55 PM   Specimen: BLOOD  Result Value Ref Range Status   Specimen Description   Final    BLOOD LEFT ANTECUBITAL Performed at [facility], [address], [place], [place] [address]    Special Requests   Final    BOTTLES DRAWN AEROBIC AND ANAEROBIC Blood Culture adequate volume Performed at [facility], [address] [place], [place] [address]    Culture   Final    NO GROWTH 3 DAYS Performed at [facility], [address] [place], [place] [address]    Report Status PENDING  Incomplete  Blood culture (routine x 2)     Status: None (Preliminary result)   Collection Time: 07/05/24  4:56 PM   Specimen: BLOOD  Result Value Ref Range Status   Specimen Description   Final    BLOOD RIGHT ANTECUBITAL Performed at [facility], [address] [place], [place] [address]    Special Requests   Final    BOTTLES DRAWN AEROBIC AND ANAEROBIC Blood Culture adequate volume Performed at [facility], [address],  [place], [place] [address]    Culture   Final    NO GROWTH 3 DAYS Performed at [facility], [address], [place], [place] [address]    Report Status PENDING  Incomplete    Labs: CBC: Recent Labs  Lab 07/05/24 1003 07/06/24 0656 07/07/24 1122 07/08/24 1421  WBC 11.7* 11.2* 21.5* 9.4  HGB 12.3 11.5* 12.4 12.5  HCT 35.9* 33.7* 36.9 37.3  MCV 97.8 99.1 99.7 101.1*  PLT 194 183 246 219   Basic Metabolic Panel: Recent Labs  Lab 07/05/24 1003 07/06/24 0656 07/07/24 1122 07/08/24 1421  NA 134* 133* 137 135  K 3.8 4.1 4.3 3.7  CL 95* 96* 99 98  CO2 28 24 25 25   GLUCOSE 120* 185* 122* 96  BUN 11 13 13 11   CREATININE 0.94 0.87 0.92 0.84  CALCIUM 9.3 9.2 9.7 9.1   Liver Function Tests: No results for input(s): AST, ALT, ALKPHOS, BILITOT, PROT, ALBUMIN in the last 168 hours. CBG: No results for input(s): GLUCAP in the last 168 hours.  Discharge time spent: greater than 30 minutes.  Signed: Burnard DELENA Cunning, DO [facility] 07/08/2024 "

## 2024-07-08 NOTE — Assessment & Plan Note (Signed)
 Seen by PT in hospital with no follow up recommended

## 2024-07-08 NOTE — Progress Notes (Signed)
 [facility]  medic out for visit this morning. On arrival pt refused care to include, labs, full assessment, vitals, and meds. See flowsheet for lung assessment. Albuterol  tx was given as this was the only medication that she wanted. Virtual RN, Dr. Fausto, Dr. Eldonna and Ellouise were made aware of the patients refusal. No other tasks were needed at this time.

## 2024-07-08 NOTE — Assessment & Plan Note (Addendum)
 Confirmed +Rhinovirus / Enterovirus

## 2024-07-08 NOTE — Progress Notes (Addendum)
 I spoke with patient via phone at 0800.Pt was under the impression staff was to be at her house at 0800. Attempted to discuss our normal flow, pt kept interrupting,and telling me to be quiet.pt in no physical distress, vss on current health. Rerouted paramedic to pts house.  Upon arrival to house, I spoke with patient via ipad.Listened to pt's complaints, again attempted to explain program and expectations and patient again told me to be quiet and listen. Contacted Manager Ellouise Myron and Dr. Eldonna. Had conference call, all listened to her questions and attempted to problem solve. Pt stated she didn't want to talk to us  anymore and ended the call.

## 2024-07-08 NOTE — Assessment & Plan Note (Signed)
 SABRA

## 2024-07-08 NOTE — Progress Notes (Signed)
 Completed virtual rounds with MD,paramedic at patient bedside. POC reviewed and discussed ,patient voices understanding and agreement. Pt reminded to call RN for any needs, RN and MD available at all times. Pt voices understanding. Pt aware of next planned visit and next call from RN.    Pts daughter at pt's side,pt agreeable to plan.

## 2024-07-08 NOTE — Plan of Care (Signed)

## 2024-07-08 NOTE — Progress Notes (Signed)
 "  Hospital at Home Daily Progress Note   Patient: [name]  MRN: [id number]  DOB: [birth year]  DOA: 07/05/2024  DOS: the patient was seen and examined on @TODAY @    Patient identified themself as [name]  DOB 1941/01/17  Medic Consuelo Kerns performed AM visited patient and completed assessment and physical exam. Patient was seen today via video conference; my physical location [place], [place]    Brief hospital course:  [name] is a 83 y.o. female with medical history significant for essential tremor, fibromyalgia, GERD, squamous cell carcinoma of the skin, HTN, HLD, insomnia, OSA, DVT not on o AC, knee osteoarthritis s/p left knee replacement who [facility] for evaluation of SOB + cough. Patient reports that in mid December, she was diagnosed with bronchitis and treated with azithromycin  and prednisone  and felt better, around christmas Eve, started having increased wheezing, cough with some SOB which have progressively gotten worse since then, so she decided to come to the ED for further evaluation.  In the ED, noted to be hypoxic, around 85% on room air, other vitals fairly stable.  Labs showed proBNP 395, WBC 11.7, D-dimer 0.79, chest x-ray unremarkable, CTA chest negative for any PE/pneumonia.  Negative flu/RSV/COVID test, although found to be positive for rhinovirus/enterovirus.  Patient admitted on 07/05/2024 for further management as outlined in detail below.  Significant events -  12/29 - transferred to Hospital at [facility] program.  Stable on room air.    12/30 - cough & diarrhea overnight.  Remains on room air.  WBC normalized. Pt having some suspected steroid-induced anxiety / restlessness / paranoia.  Similar to what was described in hospital, pt was uncooperative with AM Medic visit this AM.  Her daughter is able to reason and patient was pleasant &  cooperative later in the day.   Assessment and Plan:  * Acute respiratory failure with hypoxia  (HCC) Viral bronchitis  Rhinovirus/Enterovirus  Decompensated resp status now requiring 1-2 L to keep O2 sats >92% CTA chest negative for any acute focal infiltrate though w/ severe 3 vessel coronary disease  Resp panel + for rhinovirus/enterovirus  Started on steroid regimen, duonebs and supplemental O2 for treatment  Clinically improving though still mildly symptomatic  --Continue above management --Supplemental O2 prn  12/30 - remains stable on RA. Ongoing coughing  Bronchitis Confirmed +Rhinovirus / Enterovirus  Hyponatremia Rresolved (Na 133-->134--137)  Treated with IVF  Generalized weakness Seen by PT in hospital with no follow up recommended  Rhinovirus infection .  OSA (obstructive sleep apnea) Pt refusing CPAP in the outpatient setting    Essential tremor Cont home primidone    Primary insomnia Cont prn ambien   Follow   Fibromyalgia syndrome Baseline chronic fibromyalgia  On multiple medications including Flexeril , Lyrica  and as needed tramadol   Cont home regimen  Monitor       Patient Active Problem List   Diagnosis Date Noted   Acute respiratory failure with hypoxia (HCC) 07/05/2024    Priority: High   Bronchitis 07/05/2024    Priority: High   Hyponatremia 07/07/2024   SOB (shortness of breath) 07/05/2024   Rhinovirus infection 07/05/2024   Generalized weakness 07/05/2024   DVT (deep venous thrombosis) (HCC) 04/26/2022   AKI (acute kidney injury)    Pressure injury of skin 01/23/2021   OSA (obstructive sleep apnea) 08/23/2020   Dysphonia of essential tremor 06/21/2020   Essential tremor 06/21/2020   Chronic insomnia 06/21/2020   Status post total knee replacement,  left 04/13/2017   Vitamin D  deficiency 04/12/2017   History of chronic kidney disease 04/12/2017   Osteopenia of multiple sites 04/06/2017   Fibromyalgia syndrome 09/11/2016   Primary insomnia 09/11/2016   History of osteopenia 09/11/2016   Essential hypertension, benign  12/16/2013   Pure hypercholesterolemia 12/16/2013      Significant Events: above    Subjective / Interval 24 hour History:  Patient seen today virtually with her daughter present.  Earlier in the day during a.m. medic visit, patient had been resistant to taking medications, undergoing any assessment or having labs drawn.  She had opened all the medication bags and had generally been noncompliant with requirements of the program.  Clinical lead and medical director for the program discussed with patient and daughter.  Daughter was able to reason with the patient and this afternoon patient is cooperative and agreeable with the plan of care and requirements.  She is thankful for the team caring for her.  She reports very difficult night with cough and diarrhea.  She also had an episode of urinary incontinence.  No fevers or chills.  Denies shortness of breath at rest.  Overall feeling much better since admission.  Reports her bottom is very i irritated from diarrhea.       Antibiotic Therapy: Anti-infectives (From admission, onward)    None       Procedures: None  Consultants: None          Physical Exam:    07/08/2024   12:00 PM 07/07/2024    8:50 PM 07/07/2024    4:14 PM  Vitals with BMI  Systolic -- 176 155  Diastolic -- 76 73  Pulse 80 96 100    Patient refused physical assessment during a.m. medic visit today. Below exam findings are based on their in person physical exam findings and my observations during virtual encounter.  General exam: awake, alert, no acute distress HEENT: Appears atraumatic, hearing grossly normal  Respiratory system: On room air, normal respiratory effort without conversational dyspnea or accessory muscle use noted Cardiovascular system: Unable to be examined due to patient refusal Gastrointestinal system: soft, NT, ND, no HSM felt, +bowel sounds. Central nervous system: A&O x 3. no gross focal neurologic deficits, normal  speech Psychiatry: normal mood, congruent affect    Data Reviewed:  Notable labs Normal BMP Leukocytosis resolved, WBC 9.4  Family Communication: Daughter present during virtual visit today  Disposition: Status is: Inpatient Remains inpatient appropriate because: Warrants close monitoring overnight for further clinical improvement.  Anticipate discharge tomorrow.   Planned Discharge Destination: Home    Time spent: 55 minutes  Author: Burnard DELENA Cunning, DO [facility]  02/28/2024 7:00 AM  For on call review [email or web address].   "

## 2024-07-08 NOTE — Progress Notes (Signed)
 Spoke with patient's daughter via phone to give her MD email for [facility] papers. Also updated on paramedic visit at 6. Daughter Niels wanted to message her mother as pt is asleep and didn't want me to wake her.

## 2024-07-08 NOTE — Progress Notes (Signed)
 Phone call completed with patient. Introduced myself as the [employer] for the night and verbalized that I am available via tablet or phone call anytime overnight if issues arise. Patient's daughter called RN and stated that patient was ready to take her meds but once RN contacted patient she stated that she was napping and would like to take her bedtime medications later. RN encouraged patient to reach back out once she was ready. Patient was notified that medic would be coming to her home this evening to assist with neb treatment and to charge armband. Patient in agreement with plan.

## 2024-07-08 NOTE — Plan of Care (Signed)
" °  Problem: Education: Goal: Knowledge of General Education information will improve Description: Including pain rating scale, medication(s)/side effects and non-pharmacologic comfort measures Outcome: Progressing   Problem: Health Behavior/Discharge Planning: Goal: Ability to manage health-related needs will improve Outcome: Progressing   Problem: Clinical Measurements: Goal: Ability to maintain clinical measurements within normal limits will improve Outcome: Progressing Goal: Cardiovascular complication will be avoided Outcome: Progressing   Problem: Activity: Goal: Risk for activity intolerance will decrease Outcome: Progressing   Problem: Nutrition: Goal: Adequate nutrition will be maintained Outcome: Progressing   Problem: Coping: Goal: Level of anxiety will decrease Outcome: Progressing   Problem: Pain Managment: Goal: General experience of comfort will improve and/or be controlled Outcome: Progressing   Problem: Safety: Goal: Ability to remain free from injury will improve Outcome: Progressing   Problem: Skin Integrity: Goal: Risk for impaired skin integrity will decrease Outcome: Progressing   "

## 2024-07-08 NOTE — Hospital Course (Addendum)
 [name] is a 83 y.o. female with medical history significant for essential tremor, fibromyalgia, GERD, squamous cell carcinoma of the skin, HTN, HLD, insomnia, OSA, DVT not on o AC, knee osteoarthritis s/p left knee replacement who [facility] for evaluation of SOB + cough. Patient reports that in mid December, she was diagnosed with bronchitis and treated with azithromycin  and prednisone  and felt better, around christmas Eve, started having increased wheezing, cough with some SOB which have progressively gotten worse since then, so she decided to come to the ED for further evaluation.  In the ED, noted to be hypoxic, around 85% on room air, other vitals fairly stable.  Labs showed proBNP 395, WBC 11.7, D-dimer 0.79, chest x-ray unremarkable, CTA chest negative for any PE/pneumonia.  Negative flu/RSV/COVID test, although found to be positive for rhinovirus/enterovirus.  Patient admitted on 07/05/2024 for further management as outlined in detail below.  Significant events -  12/29 - transferred to [facility] at [facility] program.  Stable on room air.    12/30 - cough & diarrhea overnight.  Remains on room air.  WBC normalized. Pt having some suspected steroid-induced anxiety / restlessness / paranoia.  Similar to what was described in hospital, pt was uncooperative with AM Medic visit this AM.  Her daughter is able to reason and patient was pleasant &  cooperative later in the day.

## 2024-07-09 MED ORDER — PHENYLEPHRINE-MINERAL OIL-PET 0.25-14-74.9 % RE OINT: 1.0000 | TOPICAL_OINTMENT | Freq: Two times a day (BID) | RECTAL | 0 refills | Status: AC | PRN

## 2024-07-09 MED ORDER — DM-GUAIFENESIN ER 30-600 MG PO TB12: 1.0000 | ORAL_TABLET | Freq: Two times a day (BID) | ORAL | 0 refills | Status: AC

## 2024-07-09 MED ORDER — GERHARDT'S BUTT CREAM: 1.0000 | TOPICAL_CREAM | Freq: Three times a day (TID) | CUTANEOUS | 1 refills | Status: AC

## 2024-07-09 MED ORDER — LOPERAMIDE HCL 2 MG PO CAPS: 2.0000 mg | ORAL_CAPSULE | ORAL | 0 refills | Status: AC | PRN

## 2024-07-09 MED ORDER — ALBUTEROL SULFATE 0.63 MG/3ML IN NEBU: 1.0000 | INHALATION_SOLUTION | Freq: Four times a day (QID) | RESPIRATORY_TRACT | 1 refills | Status: AC | PRN

## 2024-07-09 MED ORDER — BENZONATATE 200 MG PO CAPS: 200.0000 mg | ORAL_CAPSULE | Freq: Three times a day (TID) | ORAL | 0 refills | Status: AC | PRN

## 2024-07-09 MED ORDER — PREDNISONE 20 MG PO TABS
20.0000 mg | ORAL_TABLET | Freq: Every day | ORAL | Status: AC
  Administered 2024-07-09: 20 mg via ORAL

## 2024-07-09 NOTE — Progress Notes (Signed)
 Patient sounds like she is in a good mood this morning. She report minimal coughing overnight. No complaints of pain or discomfort now. IMM letter signed at [phone] today. Plan for paramedic visit between 1030-1130 today.

## 2024-07-09 NOTE — Progress Notes (Signed)
SATURATION QUALIFICATIONS: (This note is used to comply with regulatory documentation for home oxygen)  Patient Saturations on Room Air at Rest = 96%  Patient Saturations on Room Air while Ambulating = 86%  Patient Saturations on 2 Liters of oxygen while Ambulating 96%

## 2024-07-09 NOTE — Discharge Planning (Signed)
 Pt qualifies for DME (Durable Medical Equipment) oxygen.  DME ordered through [employer].  Ryan, liaison of [employer] notified to deliver DME to pt room prior to D/C home.  DME agency information placed on After Visit Summary (AVS) for pt to refer to with any DME questions after discharge. Sam Devonshire, BSN, RN, [facility] [phone]

## 2024-07-09 NOTE — Progress Notes (Signed)
 The pt was seen for her night time  hospital at home visit. The pt was alert and oriented x4. Her skin was warm,dry and appropriate in color. She had a strong radial pulse and was breathing normally. The pt had a calm and cooperative demeanor tonight.  Vitals were taken and are as noted. Pt's lung sounds were ausculted and were clear and equal in all fields. The pt was assisted in a breathing treatment.  The virtual nurse was then called and the pt's nighttime medications were given to her. The pt was noted to be missing her trazodone  and she became upset when informed that there was not on in her mediations bin. The pt was informed that the field team paramedic could go back out and she stated that it was fine that she would try tog et sleep anyway. The pt was informed that we had the cream for her bottom but. She stated she would apply it herself in the morning.  Once pt's medications were given she was informed of her IMM letter and she stated that she did not want to sign it at this time; that she needed time to think about it and read over the letter.  The pt stated she was going to bed and would call if she needed help with anything.

## 2024-07-09 NOTE — Progress Notes (Signed)
 Communicated with patient via video tablet. Patient appears comfortable on the chair. AVS paperwork given to patient. Discharge instructions discussed. Patients question answered to satisfaction. Patient agreeable with discharge plan.   Patient qualifies for home O2 use. Dr. Sherre made aware. Nebulizer and O2 concentrator will be delivered to patient's house this evening per case manager, Camellia.

## 2024-07-09 NOTE — Assessment & Plan Note (Signed)
 Continue home simvastatin  40 mg qhs

## 2024-07-09 NOTE — Discharge Planning (Signed)
 Transition of Care [facility]) - Inpatient Brief Assessment   Patient Details  Name: VONDA HARTH MRN: [id number] Date of Birth: [birth year]  Transition of Care [facility]) CM/SW Contact:    Debarah Saunas, RN Phone Number: 07/09/2024, 12:20 PM   Clinical Narrative: [facility] consulted regarding DME nebulizer.  Pt qualifies for DME (Durable Medical Equipment) nebulizer.  DME ordered through [employer].  Ryan, liaison of [employer] notified to deliver DME to pt room prior to D/C home.  DME agency information placed on After Visit Summary (AVS) for pt to refer to with any DME questions after discharge.    Transition of Care Asessment: Insurance and Status: Insurance coverage has been reviewed Patient has primary care physician: Yes Home environment has been reviewed: from home alone (supportive daughter is [employer]) Prior level of function:: independent/modified indepent has rolling walker and cane to use as needed Prior/Current Home Services: No current home services Social Drivers of Health Review: SDOH reviewed no interventions necessary Readmission risk has been reviewed: Yes Transition of care needs: transition of care needs identified, TOC will continue to follow

## 2024-07-09 NOTE — Assessment & Plan Note (Signed)
 Home amlodipine  5 mg daily and atenolol  25 mg daily

## 2024-07-09 NOTE — Assessment & Plan Note (Signed)
 Secondary to bronchitis and acute hypoxic respiratory failure

## 2024-07-09 NOTE — Progress Notes (Incomplete)
 Arrived to patient    she is alert and oriented   vitals are stable   she has no complaints    she is ready for discharge   morning meds were given   neb treatment was given   o2 challenge was given  seated she was 96    dropped to 86 fairly quickly    once seated she went to 90   after [place] at 2lt she went to 98   ambulating with o2 she stayed at 94   discharge papers where gone over by virtual RN

## 2024-07-09 NOTE — Discharge Summary (Signed)
 " Physician Discharge Summary- Virtual Encounter   Patient: [name] MRN: [id number] DOB: [birth year]  Admit date:     07/05/2024  Discharge date: 07/09/2024  Discharge Physician: Dr. Sherre   PCP: Ozell Heron HERO, MD   Recommendations at discharge:   Followup with PCP within 2-4 weeks after discharge, for repeat labs: CBC, BMP Albuterol  nebulizer and DME nebulizer machine prescribed on discharge. DME oxygen 2 L [place] prescribed as needed for activity on discharge.  Discharge Diagnoses:  Principal Problem:   Acute respiratory failure with hypoxia (HCC) Active Problems:   Rhinovirus infection   Bronchitis   OSA (obstructive sleep apnea)   SOB (shortness of breath)   Hyponatremia   Essential hypertension, benign   Pure hypercholesterolemia   Fibromyalgia syndrome   Primary insomnia   Essential tremor   Generalized weakness  Resolved Problems:   * No resolved hospital problems. [facility] Course:  [name] is a 83 y.o. female with medical history significant for essential tremor, fibromyalgia, GERD, squamous cell carcinoma of the skin, HTN, HLD, insomnia, OSA, DVT not on DOAC, knee osteoarthritis s/p left knee replacement.  07/05/24: she presents the ED for evaluation of SOB + cough. Patient reports that in mid December, she was diagnosed with bronchitis and treated with azithromycin  and prednisone  and felt better. Around christmas Eve, started having increased wheezing, cough with some SOB which have progressively gotten worse since then, so she decided to come to the ED for further evaluation.  In the ED, noted to be hypoxic, around 85% on room air, other vitals fairly stable.  Labs showed proBNP 395, WBC 11.7, D-dimer 0.79, chest x-ray unremarkable, CTA chest negative for any PE/pneumonia.  Negative flu/RSV/COVID test, although found to be positive for rhinovirus/enterovirus.  Patient admitted on 07/05/2024 for further management as outlined in detail below.  CTA chest  PE: negative for PE. Pt received solumedrol 125 mg IV once and duo-nebs.  Significant events -  12/27: Patient admitted to [facility] for acute hypoxic respiratory failure with hypoxic requiring 2-3 L [place] supplementation secondary to rhinovirus infection and acute bronchitis. Prednisone  40 mg daily initiated.  12/29 - transferred to [facility] program. Stable on room air.   12/30 - cough & diarrhea overnight.  Remains on room air. WBC normalized. Pt having some suspected steroid-induced anxiety / restlessness / paranoia. Similar to what was described in hospital, pt was uncooperative with AM Medic visit this AM. Pt daughter is able to reason and patient was pleasant & cooperative later in the day.  12/31: I assumed care of the patient. Prednisone  40 mg daily decreased to 20 mg daily for last dose.  Assessment and Plan:  * Acute respiratory failure with hypoxia (HCC) Viral bronchitis  Rhinovirus/Enterovirus  Decompensated resp status now requiring 1-2 L to keep O2 sats >92% CTA chest negative for any acute focal infiltrate though w/ severe 3 vessel coronary disease  Resp panel + for rhinovirus/enterovirus  Started on steroid regimen, duonebs and supplemental O2 for treatment  Clinically improving though still mildly symptomatic  Continue above management 12/30 - remains stable on RA. Ongoing coughing 12/31 - prednisone  40 mg daily decreased to 20 mg daily for last dose.  Patient had oxygen challenge today. See RN note. DME 2 L oxygen for prn. TOC aware  Bronchitis Confirmed +Rhinovirus / Enterovirus  Rhinovirus infection Symptomatic support  Hyponatremia Rresolved (Na 133-->134--137)  Treated with IVF  SOB (shortness of breath) Secondary to bronchitis and acute  hypoxic respiratory failure  OSA (obstructive sleep apnea) Pt refusing CPAP in the outpatient setting  Generalized weakness Seen by PT in hospital with no follow up recommended  Essential  tremor Continue home primidone  50 mg daily  Primary insomnia Continue as needed trazodone  at home  Fibromyalgia syndrome Baseline, chronic Continue home pregabalin  75 mg BID, cyclobenzaprine  10 mg at bedtime, tramadol  50 mg q8h prn for severe pain PDMP reviewed  Pure hypercholesterolemia Continue home simvastatin  40 mg qhs  Essential hypertension, benign Home amlodipine  5 mg daily and atenolol  25 mg daily     Pain control - [place]  Controlled Substance Reporting System database was reviewed. and patient was instructed, not to drive, operate heavy machinery, perform activities at heights, swimming or participation in water  activities or provide baby-sitting services while on Pain, Sleep and Anxiety Medications; until their outpatient Physician has advised to do so again. Also recommended to not to take more than prescribed Pain, Sleep and Anxiety Medications.  Consultants: TO, PT, OT Procedures performed: none  Disposition: Home Diet recommendation:  Cardiac diet DISCHARGE MEDICATION: Allergies as of 07/09/2024       Reactions   Propoxyphene Other (See Comments)   Shellfish Allergy Itching   Severe itching after mussels   Erythromycin Base Other (See Comments)   Hctz [hydrochlorothiazide] Other (See Comments)   Hyponatremia    Lisinopril Other (See Comments)   Hyponatremia   Sulfa Antibiotics Rash        Medication List     TAKE these medications    albuterol  108 (90 Base) MCG/ACT inhaler Commonly known as: VENTOLIN  HFA Inhale 2 puffs into the lungs every 6 (six) hours as needed for wheezing or shortness of breath. What changed: Another medication with the same name was added. Make sure you understand how and when to take each.   albuterol  0.63 MG/3ML nebulizer solution Commonly known as: ACCUNEB  Take 3 mLs (0.63 mg total) by nebulization every 6 (six) hours as needed for wheezing. What changed: You were already taking a medication with the same name, and  this prescription was added. Make sure you understand how and when to take each.   amLODipine  5 MG tablet Commonly known as: NORVASC  TAKE 1 TABLET BY MOUTH DAILY   atenolol  25 MG tablet Commonly known as: TENORMIN  TAKE 1 TABLET BY MOUTH DAILY   benzonatate  200 MG capsule Commonly known as: TESSALON  Take 1 capsule (200 mg total) by mouth 3 (three) times daily as needed (cough suppression).   cetirizine 10 MG tablet Commonly known as: ZYRTEC Take 10 mg by mouth daily. What changed:  when to take this reasons to take this   cyclobenzaprine  10 MG tablet Commonly known as: FLEXERIL  TAKE 1 TABLET BY MOUTH AT BEDTIME   dextromethorphan -guaiFENesin  30-600 MG 12hr tablet Commonly known as: MUCINEX  DM Take 1 tablet by mouth 2 (two) times daily.   Gerhardt's butt cream Crea Apply 1 Application topically 3 (three) times daily.   hydroxypropyl methylcellulose / hypromellose 2.5 % ophthalmic solution Commonly known as: ISOPTO TEARS / GONIOVISC Place 1 drop into both eyes as needed for dry eyes.   loperamide  2 MG capsule Commonly known as: IMODIUM  Take 1 capsule (2 mg total) by mouth as needed for diarrhea or loose stools.   phenylephrine -shark liver oil-mineral oil-petrolatum  0.25-14-74.9 % rectal ointment Commonly known as: PREPARATION H Place 1 Application rectally 2 (two) times daily as needed for hemorrhoids.   pregabalin  75 MG capsule Commonly known as: LYRICA  Take 1 capsule (75 mg  total) by mouth 2 (two) times daily.   primidone  50 MG tablet Commonly known as: MYSOLINE  TAKE 1 TABLET BY MOUTH TWICE A DAY   simvastatin  40 MG tablet Commonly known as: ZOCOR  Take 1 tablet (40 mg total) by mouth at bedtime.   traMADol  50 MG tablet Commonly known as: ULTRAM  Take 1 tablet (50 mg total) by mouth every 8 (eight) hours as needed for up to 2 days for severe pain (pain score 7-10) or moderate pain (pain score 4-6). What changed: reasons to take this   traZODone  50 MG  tablet Commonly known as: DESYREL  TAKE 1 TABLET BY MOUTH EVERY NIGHT AT BEDTIME AS NEEDED FOR SLEEP               Durable Medical Equipment  (From admission, onward)           Start     Ordered   07/09/24 1246  For home use only DME oxygen  Once       Comments: 2 L [place] O2 supplementation needed for activity.  Question Answer Comment  Length of Need 6 Months   Mode or (Route) Nasal cannula   Liters per Minute 2   Oxygen delivery system: Gas   Oxygen delivery system: Portable concentrator (POC)   Oxygen delivery system: Concentrator   Oxygen delivery system: Gas/Tank      07/09/24 1246   07/09/24 1202  For home use only DME Nebulizer machine  Once       Question Answer Comment  Patient needs a nebulizer to treat with the following condition Bronchitis   Length of Need 6 Months   Additional equipment included Administration kit   Additional equipment included Filter      07/09/24 1202            Contact information for after-discharge care     Durable Medical Equipment     [facility] (DME) Follow up.   Service: Durable Medical Equipment Why: DME Agency (home equipment) Contact information: [address] [place]  [address] [phone]                    Discharge Exam: [name]   07/05/24 1836  Weight: 78.1 kg   Physical exam was completed with the assistance of [employer], paramedic, who was present in the home at the time of the virtual encounter.   Physical Exam Constitutional:      Appearance: She is well-developed.  HENT:     Head: Normocephalic and atraumatic.  Eyes:     Extraocular Movements: Extraocular movements intact.     Comments: Conjunctiva normal  Cardiovascular:     Rate and Rhythm: Normal rate and regular rhythm.     Heart sounds: Normal heart sounds.  Pulmonary:     Effort: Pulmonary effort is normal.     Breath sounds: Normal breath sounds. No wheezing.  Abdominal:      General: Bowel sounds are normal.     Palpations: Abdomen is soft.  Musculoskeletal:        General: Normal range of motion.     Cervical back: Normal range of motion and neck supple.  Skin:    Capillary Refill: Capillary refill takes less than 2 seconds.     Coloration: Skin is not cyanotic or pale.  Neurological:     General: No focal deficit present.     Mental Status: She is alert and oriented to person, place, and time.  Psychiatric:  Mood and Affect: Mood normal.        Behavior: Behavior normal.   Condition at discharge: good  The results of significant diagnostics from this hospitalization (including imaging, microbiology, ancillary and laboratory) are listed below for reference.   Imaging Studies: ECHOCARDIOGRAM COMPLETE Result Date: 07/06/2024    ECHOCARDIOGRAM REPORT   Patient Name:   DREA JUREWICZ [facility] Date of Exam: 07/06/2024 Medical Rec #:  [id number]    Height:       63.0 in Accession #:    [id number]   Weight:       172.2 lb Date of Birth:  [birth year]    BSA:          1.814 m Patient Age:    83 years     BP:           135/105 mmHg Patient Gender: F            HR:           114 bpm. Exam Location:  Inpatient Procedure: 2D Echo, Color Doppler and Cardiac Doppler (Both Spectral and Color            Flow Doppler were utilized during procedure). Indications:    Dyspnea R06.00  History:        Patient has no prior history of Echocardiogram examinations.                 Signs/Symptoms:Murmur and Hypertensive Heart Disease.  Sonographer:    Sydnee Wilson RDCS Referring Phys: [id number] NKEIRUKA J EZENDUKA IMPRESSIONS  1. Left ventricular ejection fraction, by estimation, is >75%. The left ventricle has hyperdynamic function. The left ventricle has no regional wall motion abnormalities. Left ventricular diastolic parameters were normal.  2. Right ventricular systolic function is normal. The right ventricular size is normal.  3. The mitral valve is normal in structure. No evidence of mitral  valve regurgitation. No evidence of mitral stenosis.  4. The aortic valve is normal in structure. Aortic valve regurgitation is not visualized. No aortic stenosis is present.  5. The inferior vena cava is normal in size with greater than 50% respiratory variability, suggesting right atrial pressure of 3 mmHg. FINDINGS  Left Ventricle: Left ventricular ejection fraction, by estimation, is >75%. The left ventricle has hyperdynamic function. The left ventricle has no regional wall motion abnormalities. The left ventricular internal cavity size was normal in size. There is no left ventricular hypertrophy. Left ventricular diastolic parameters were normal. Right Ventricle: The right ventricular size is normal. No increase in right ventricular wall thickness. Right ventricular systolic function is normal. Left Atrium: Left atrial size was normal in size. Right Atrium: Right atrial size was normal in size. Pericardium: There is no evidence of pericardial effusion. Mitral Valve: The mitral valve is normal in structure. No evidence of mitral valve regurgitation. No evidence of mitral valve stenosis. Tricuspid Valve: The tricuspid valve is normal in structure. Tricuspid valve regurgitation is not demonstrated. No evidence of tricuspid stenosis. Aortic Valve: The aortic valve is normal in structure. Aortic valve regurgitation is not visualized. No aortic stenosis is present. Aortic valve mean gradient measures 8.0 mmHg. Aortic valve peak gradient measures 12.5 mmHg. Aortic valve area, by VTI measures 2.73 cm. Pulmonic Valve: The pulmonic valve was normal in structure. Pulmonic valve regurgitation is not visualized. No evidence of pulmonic stenosis. Aorta: The aortic root is normal in size and structure. Venous: The inferior vena cava is normal in size with greater than 50% respiratory variability, suggesting right  atrial pressure of 3 mmHg. IAS/Shunts: No atrial level shunt detected by color flow Doppler.  LEFT VENTRICLE PLAX  2D LVIDd:         3.60 cm     Diastology LVIDs:         2.40 cm     LV e' medial:    12.00 cm/s LV PW:         1.00 cm     LV E/e' medial:  9.2 LV IVS:        0.80 cm     LV e' lateral:   14.00 cm/s LVOT diam:     2.00 cm     LV E/e' lateral: 7.9 LV SV:         90 LV SV Index:   50 LVOT Area:     3.14 cm  LV Volumes (MOD) LV vol d, MOD A2C: 89.0 ml LV vol d, MOD A4C: 60.6 ml LV vol s, MOD A2C: 28.6 ml LV vol s, MOD A4C: 26.0 ml LV SV MOD A2C:     60.4 ml LV SV MOD A4C:     60.6 ml LV SV MOD BP:      46.9 ml RIGHT VENTRICLE TAPSE (M-mode): 1.8 cm LEFT ATRIUM             Index        RIGHT ATRIUM           Index LA diam:        4.20 cm 2.31 cm/m   RA Area:     12.30 cm LA Vol (A2C):   64.3 ml 35.44 ml/m  RA Volume:   24.10 ml  13.28 ml/m LA Vol (A4C):   49.0 ml 27.01 ml/m LA Biplane Vol: 57.5 ml 31.69 ml/m  AORTIC VALVE AV Area (Vmax):    2.57 cm AV Area (Vmean):   2.46 cm AV Area (VTI):     2.73 cm AV Vmax:           177.00 cm/s AV Vmean:          133.000 cm/s AV VTI:            0.329 m AV Peak Grad:      12.5 mmHg AV Mean Grad:      8.0 mmHg LVOT Vmax:         145.00 cm/s LVOT Vmean:        104.000 cm/s LVOT VTI:          0.286 m LVOT/AV VTI ratio: 0.87  AORTA Ao Root diam: 2.90 cm MITRAL VALVE                TRICUSPID VALVE MV Area (PHT): 2.50 cm     TR Peak grad:   55.1 mmHg MV E velocity: 110.00 cm/s  TR Vmax:        371.00 cm/s MV A velocity: 93.00 cm/s MV E/A ratio:  1.18         SHUNTS                             Systemic VTI:  0.29 m                             Systemic Diam: 2.00 cm Oneil Parchment MD Electronically signed by Oneil Parchment MD Signature Date/Time: 07/06/2024/4:12:43 PM    Final    CT Angio  Chest PE W and/or Wo Contrast Result Date: 07/05/2024 EXAM: CTA CHEST 07/05/2024 12:49:26 PM TECHNIQUE: CTA of the chest was performed without and with the administration of 75 mL of intravenous iohexol  (OMNIPAQUE ) 350 MG/ML injection. Multiplanar reformatted images are provided for review. MIP  images are provided for review. Automated exposure control, iterative reconstruction, and/or weight based adjustment of the mA/kV was utilized to reduce the radiation dose to as low as reasonably achievable. COMPARISON: CT angio chest 03/16/23, CT abdomen and pelvis 01/22/2021 CLINICAL HISTORY: Pulmonary embolism (PE) suspected, low to intermediate prob, positive D-dimer; Shortness of breath and new oxygen requirement. Also with cough, eval for pneumonia. FINDINGS: PULMONARY ARTERIES: Pulmonary arteries are adequately opacified for evaluation. No acute pulmonary embolus. Main pulmonary artery is normal in caliber. MEDIASTINUM: The heart demonstrates at least 3-vessel coronary artery calcifications. The pericardium demonstrates no acute abnormality. There is severe atherosclerotic plaque of the thoracic aorta. LYMPH NODES: No mediastinal, hilar or axillary lymphadenopathy. LUNGS AND PLEURA: Diffuse mild bronchial wall thickening. No focal consolidation or pulmonary edema. No evidence of pleural effusion or pneumothorax. UPPER ABDOMEN: Subcentimeter calcifications within the hepatic parenchyma are nonspecific. SOFT TISSUES AND BONES: Multilevel moderate degenerative changes of the spine. Sclerotic lesion within the T3 vertebral body, likely a bone island. No acute soft tissue abnormality. IMPRESSION: 1. No pulmonary embolism. 2. No acute pulmonary abnormality. 3. Severe atherosclerotic plaque with at least 3-vessel coronary artery calcifications. Electronically signed by: Morgane Naveau MD 07/05/2024 01:55 PM EST RP Workstation: [id number]   DG Chest 2 View Result Date: 07/05/2024 CLINICAL DATA:  Shortness of breath. EXAM: CHEST - 2 VIEW COMPARISON:  01/01/2024 FINDINGS: The heart size and mediastinal contours are within normal limits. Both lungs are clear. The visualized skeletal structures are unremarkable. IMPRESSION: No active cardiopulmonary disease. Electronically Signed   By: Camellia Candle M.D.   On:  07/05/2024 11:29   Microbiology: Results for orders placed or performed during the hospital encounter of 07/05/24  Resp panel by RT-PCR (RSV, Flu A&B, Covid) Anterior Nasal Swab     Status: None   Collection Time: 07/05/24 10:53 AM   Specimen: Anterior Nasal Swab  Result Value Ref Range Status   SARS Coronavirus 2 by RT PCR NEGATIVE NEGATIVE Final    Comment: (NOTE) SARS-CoV-2 target nucleic acids are NOT DETECTED.  The SARS-CoV-2 RNA is generally detectable in upper respiratory specimens during the acute phase of infection. The lowest concentration of SARS-CoV-2 viral copies this assay can detect is 138 copies/mL. A negative result does not preclude SARS-Cov-2 infection and should not be used as the sole basis for treatment or other patient management decisions. A negative result may occur with  improper specimen collection/handling, submission of specimen other than nasopharyngeal swab, presence of viral mutation(s) within the areas targeted by this assay, and inadequate number of viral copies(<138 copies/mL). A negative result must be combined with clinical observations, patient history, and epidemiological information. The expected result is Negative.  Fact Sheet for Patients:  bloggercourse.com  Fact Sheet for Healthcare Providers:  seriousbroker.it  This test is no t yet approved or cleared by the [place]  FDA and  has been authorized for detection and/or diagnosis of SARS-CoV-2 by FDA under an Emergency Use Authorization (EUA). This EUA will remain  in effect (meaning this test can be used) for the duration of the COVID-19 declaration under Section 564(b)(1) of the Act, 21 U.S.C.section 360bbb-3(b)(1), unless the authorization is terminated  or revoked sooner.       Influenza A  by PCR NEGATIVE NEGATIVE Final   Influenza B by PCR NEGATIVE NEGATIVE Final    Comment: (NOTE) The Xpert Xpress SARS-CoV-2/FLU/RSV plus  assay is intended as an aid in the diagnosis of influenza from Nasopharyngeal swab specimens and should not be used as a sole basis for treatment. Nasal washings and aspirates are unacceptable for Xpert Xpress SARS-CoV-2/FLU/RSV testing.  Fact Sheet for Patients: bloggercourse.com  Fact Sheet for Healthcare Providers: seriousbroker.it  This test is not yet approved or cleared by the [place]  FDA and has been authorized for detection and/or diagnosis of SARS-CoV-2 by FDA under an Emergency Use Authorization (EUA). This EUA will remain in effect (meaning this test can be used) for the duration of the COVID-19 declaration under Section 564(b)(1) of the Act, 21 U.S.C. section 360bbb-3(b)(1), unless the authorization is terminated or revoked.     Resp Syncytial Virus by PCR NEGATIVE NEGATIVE Final    Comment: (NOTE) Fact Sheet for Patients: bloggercourse.com  Fact Sheet for Healthcare Providers: seriousbroker.it  This test is not yet approved or cleared by the [place]  FDA and has been authorized for detection and/or diagnosis of SARS-CoV-2 by FDA under an Emergency Use Authorization (EUA). This EUA will remain in effect (meaning this test can be used) for the duration of the COVID-19 declaration under Section 564(b)(1) of the Act, 21 U.S.C. section 360bbb-3(b)(1), unless the authorization is terminated or revoked.  Performed at [facility], [address] [place], [place] [address]   Respiratory (~20 pathogens) panel by PCR     Status: Abnormal   Collection Time: 07/05/24 10:53 AM   Specimen: Nasopharyngeal Swab; Respiratory  Result Value Ref Range Status   Adenovirus NOT DETECTED NOT DETECTED Final   Coronavirus 229E NOT DETECTED NOT DETECTED Final    Comment: (NOTE) The Coronavirus on the Respiratory Panel, DOES NOT test for the novel   Coronavirus (2019 nCoV)    Coronavirus HKU1 NOT DETECTED NOT DETECTED Final   Coronavirus NL63 NOT DETECTED NOT DETECTED Final   Coronavirus OC43 NOT DETECTED NOT DETECTED Final   Metapneumovirus NOT DETECTED NOT DETECTED Final   Rhinovirus / Enterovirus DETECTED (A) NOT DETECTED Final   Influenza A NOT DETECTED NOT DETECTED Final   Influenza B NOT DETECTED NOT DETECTED Final   Parainfluenza Virus 1 NOT DETECTED NOT DETECTED Final   Parainfluenza Virus 2 NOT DETECTED NOT DETECTED Final   Parainfluenza Virus 3 NOT DETECTED NOT DETECTED Final   Parainfluenza Virus 4 NOT DETECTED NOT DETECTED Final   Respiratory Syncytial Virus NOT DETECTED NOT DETECTED Final   Bordetella pertussis NOT DETECTED NOT DETECTED Final   Bordetella Parapertussis NOT DETECTED NOT DETECTED Final   Chlamydophila pneumoniae NOT DETECTED NOT DETECTED Final   Mycoplasma pneumoniae NOT DETECTED NOT DETECTED Final    Comment: Performed at [facility], [address], [place], [place] [address]  Blood culture (routine x 2)     Status: None (Preliminary result)   Collection Time: 07/05/24  4:55 PM   Specimen: BLOOD  Result Value Ref Range Status   Specimen Description   Final    BLOOD LEFT ANTECUBITAL Performed at [facility], [address], [place], [place] [address]    Special Requests   Final    BOTTLES DRAWN AEROBIC AND ANAEROBIC Blood Culture adequate volume Performed at [facility], [address], [place], [place] [address]    Culture   Final    NO GROWTH 4 DAYS Performed at [facility], [address], [place], [place] [address]    Report Status PENDING  Incomplete  Blood culture (routine x 2)     Status: None (Preliminary result)   Collection Time: 07/05/24  4:56 PM   Specimen: BLOOD  Result Value Ref Range Status   Specimen Description   Final    BLOOD RIGHT ANTECUBITAL Performed at [facility], [address],  [place], [place] [address]    Special Requests   Final    BOTTLES DRAWN AEROBIC AND ANAEROBIC Blood Culture adequate volume Performed at [facility], [address], [place], [place] [address]    Culture   Final    NO GROWTH 4 DAYS Performed at [facility], [address], [place], [place] [address]    Report Status PENDING  Incomplete   Labs: CBC: Recent Labs  Lab 07/05/24 1003 07/06/24 0656 07/07/24 1122 07/08/24 1421  WBC 11.7* 11.2* 21.5* 9.4  HGB 12.3 11.5* 12.4 12.5  HCT 35.9* 33.7* 36.9 37.3  MCV 97.8 99.1 99.7 101.1*  PLT 194 183 246 219   Basic Metabolic Panel: Recent Labs  Lab 07/05/24 1003 07/06/24 0656 07/07/24 1122 07/08/24 1421  NA 134* 133* 137 135  K 3.8 4.1 4.3 3.7  CL 95* 96* 99 98  CO2 28 24 25 25   GLUCOSE 120* 185* 122* 96  BUN 11 13 13 11   CREATININE 0.94 0.87 0.92 0.84  CALCIUM 9.3 9.2 9.7 9.1   Discharge time spent: greater than 30 minutes.  Signed: Dr. Sherre [place]  [facility] 07/09/2024 "

## 2024-07-09 NOTE — Progress Notes (Signed)
 All equipment collected from home   patient is advised to call back with any questions she may have after discharge

## 2024-07-10 LAB — CULTURE, BLOOD (ROUTINE X 2)
Culture: NO GROWTH
Culture: NO GROWTH
Special Requests: ADEQUATE
Special Requests: ADEQUATE

## 2024-07-11 ENCOUNTER — Other Ambulatory Visit: Payer: Self-pay | Admitting: Family Medicine

## 2024-07-11 ENCOUNTER — Ambulatory Visit: Payer: Self-pay | Admitting: Family Medicine

## 2024-07-11 DIAGNOSIS — F5104 Psychophysiologic insomnia: Secondary | ICD-10-CM

## 2024-07-11 DIAGNOSIS — M1711 Unilateral primary osteoarthritis, right knee: Secondary | ICD-10-CM

## 2024-07-11 MED ORDER — TRAZODONE HCL 50 MG PO TABS: 50.0000 mg | ORAL_TABLET | Freq: Every evening | ORAL | 0 refills | Status: AC | PRN

## 2024-07-11 NOTE — Telephone Encounter (Signed)
" °  FYI Only or Action Required?: Action required by provider: medication refill request.  Patient was last seen in primary care on 06/23/2024 by Briana Heron HERO, MD.  Called Nurse Triage reporting Medication Refill.  Triage Disposition: Call PCP When Office is Open  Patient/caregiver understands and will follow disposition?: Yes   Copied from CRM #[id number]. Topic: Clinical - Red Word Triage >> Jul 11, 2024  9:57 AM [name] wrote: Red Word that prompted transfer to Nurse Triage: possible withdrawal from no pain meds and pain is worsening due to no meds Reason for Disposition  Caller requesting a CONTROLLED substance prescription refill (e.g., narcotics, ADHD medicines)  Answer Assessment - Initial Assessment Questions 1. DRUG NAME: What medicine do you need to have refilled?      Trazodone  and Tramadol    4. PRESCRIBER: Who prescribed it? Note: The prescribing doctor or group is responsible for refill approvals..      Dr. Ozell  5. PHARMACY: Have you contacted your pharmacy (drugstore)? Note: Some pharmacies will contact the doctor (or NP/PA).       Yes, pt has contact [facility] and was told medication was denied by [facility]    Pt reports being out of pain medications Trazodone  and Tramadol  Routing to clinic to assist with refills Pt agrees with plan of care, will call back for any worsening symptoms  Protocols used: Medication Refill and Renewal Call-A-AH  "

## 2024-07-11 NOTE — Telephone Encounter (Signed)
"  Rx  done   "

## 2024-07-11 NOTE — Telephone Encounter (Signed)
 I just called in the tramadol  today, ok to refill trazodone 

## 2024-07-18 ENCOUNTER — Ambulatory Visit
Admission: RE | Admit: 2024-07-18 | Discharge: 2024-07-18 | Disposition: A | Source: Ambulatory Visit | Attending: Family Medicine

## 2024-07-18 DIAGNOSIS — Z1231 Encounter for screening mammogram for malignant neoplasm of breast: Secondary | ICD-10-CM

## 2024-07-21 ENCOUNTER — Telehealth: Payer: Self-pay | Admitting: *Deleted

## 2024-07-21 NOTE — Telephone Encounter (Signed)
 Looks like it was for an acute illness -- I think she needs to come in for a visit to see what her oxygen levels are without the oxygen -- please have her schedule a hospital follow up if not already done

## 2024-07-21 NOTE — Telephone Encounter (Signed)
 Copied from CRM #[id number]. Topic: Appointments - Scheduling Inquiry for Clinic >> Jul 21, 2024  8:48 AM Harlene ORN wrote: Reason for CRM: was in the hospital for 5 days after Christmas was sent home with a oxygen concentrator and wanted to check in with PCP, Dr. Heddie and ask her if this is something she should use. Wants her input.

## 2024-07-21 NOTE — Telephone Encounter (Signed)
 Patient informed of the message below and a hospital follow up was scheduled on 1/14.

## 2024-07-23 ENCOUNTER — Ambulatory Visit: Admitting: Family Medicine

## 2024-07-23 ENCOUNTER — Encounter: Payer: Self-pay | Admitting: Family Medicine

## 2024-07-23 ENCOUNTER — Other Ambulatory Visit: Payer: Self-pay | Admitting: Family Medicine

## 2024-07-23 VITALS — BP 134/58 | HR 85 | Temp 97.7°F | Ht 63.0 in | Wt 172.2 lb

## 2024-07-23 DIAGNOSIS — J40 Bronchitis, not specified as acute or chronic: Secondary | ICD-10-CM

## 2024-07-23 DIAGNOSIS — Z23 Encounter for immunization: Secondary | ICD-10-CM | POA: Diagnosis not present

## 2024-07-23 DIAGNOSIS — R928 Other abnormal and inconclusive findings on diagnostic imaging of breast: Secondary | ICD-10-CM

## 2024-07-23 DIAGNOSIS — J9601 Acute respiratory failure with hypoxia: Secondary | ICD-10-CM

## 2024-07-23 NOTE — Progress Notes (Signed)
 "  Established Patient Office Visit  Subjective   Patient ID: [name], female    DOB: 04-17-1941  Age: 84 y.o. MRN: [id number]  Chief Complaint  Patient presents with   Hospitalization Follow-up   Shortness of Breath    Noted with exertion x6 months, worse lately, states O2 was in the 80's yesterday at home-87%    Shortness of Breath  Discussed the use of AI scribe software for clinical note transcription with the patient, who gave verbal consent to proceed.  History of Present Illness   [name] is an 84 year old female who presents with ongoing respiratory issues following a recent illness.  She was seen on January 15 and treated with steroids and antibiotics for a respiratory illness, with partial improvement before worsening again. She was ruled out for pulmonary embolism and had an echocardiogram.  She had a prior psychotic episode on high-dose prednisone  with paranoia and anxiety, which resolved after stopping the medication. Since discharge from the hospital two weeks ago her oxygen saturation has fluctuated, with recent readings of 92% on room air and 89% with exertion, improving with rest. She monitors with a home pulse oximeter and uses inhalers and nebulizer treatments as needed.  Her daughter is concerned about possible atrial fibrillation, though there is no documentation of this. Her sister had atrial fibrillation and died of a stroke shortly after diagnosis.  She noted an elevated heart rate after drinking caffeinated tea, which she does not usually consume. Her heart rate stayed elevated through the day and returned to normal after her bedtime dose of atenolol .  She lives independently with a dog, stays active in her community, and attends exercise classes. She has resumed physical activity cautiously in light of her recent respiratory illness and watches her symptoms closely.        Current Outpatient Medications  Medication Instructions   albuterol   (ACCUNEB ) 0.63 mg, Nebulization, Every 6 hours PRN   albuterol  (VENTOLIN  HFA) 108 (90 Base) MCG/ACT inhaler 2 puffs, Inhalation, Every 6 hours PRN   amLODipine  (NORVASC ) 5 mg, Oral, Daily   atenolol  (TENORMIN ) 25 mg, Oral, Daily   benzonatate  (TESSALON ) 200 mg, Oral, 3 times daily PRN   cetirizine (ZYRTEC) 10 mg, Daily   cyclobenzaprine  (FLEXERIL ) 10 mg, Oral, Daily at bedtime   dextromethorphan -guaiFENesin  (MUCINEX  DM) 30-600 MG 12hr tablet 1 tablet, Oral, 2 times daily   hydroxypropyl methylcellulose / hypromellose (ISOPTO TEARS / GONIOVISC) 2.5 % ophthalmic solution 1 drop, As needed   loperamide  (IMODIUM ) 2 mg, Oral, As needed   Nystatin (GERHARDT'S BUTT CREAM) CREA 1 Application, Topical, 3 times daily   phenylephrine -shark liver oil-mineral oil-petrolatum  (PREPARATION H) 0.25-14-74.9 % rectal ointment 1 Application, Rectal, 2 times daily PRN   pregabalin  (LYRICA ) 75 mg, Oral, 2 times daily   primidone  (MYSOLINE ) 50 mg, Oral, 2 times daily   simvastatin  (ZOCOR ) 40 mg, Oral, Daily at bedtime   traMADol  (ULTRAM ) 50 mg, Oral, Every 8 hours PRN   traZODone  (DESYREL ) 50 mg, Oral, At bedtime PRN, for sleep    Patient Active Problem List   Diagnosis Date Noted   Hyponatremia 07/07/2024   SOB (shortness of breath) 07/05/2024   Acute respiratory failure with hypoxia (HCC) 07/05/2024   Rhinovirus infection 07/05/2024   Bronchitis 07/05/2024   Generalized weakness 07/05/2024   DVT (deep venous thrombosis) (HCC) 04/26/2022   AKI (acute kidney injury)    Pressure injury of skin 01/23/2021   OSA (obstructive sleep apnea) 08/23/2020  Dysphonia of essential tremor 06/21/2020   Essential tremor 06/21/2020   Chronic insomnia 06/21/2020   Status post total knee replacement, left 04/13/2017   Vitamin D  deficiency 04/12/2017   History of chronic kidney disease 04/12/2017   Osteopenia of multiple sites 04/06/2017   Fibromyalgia syndrome 09/11/2016   Primary insomnia 09/11/2016   History of  osteopenia 09/11/2016   Essential hypertension, benign 12/16/2013   Pure hypercholesterolemia 12/16/2013     Review of Systems  Respiratory:  Positive for shortness of breath.   All other systems reviewed and are negative.     Objective:     BP (!) 134/58   Pulse 85   Temp 97.7 F (36.5 C) (Oral)   Ht 5' 3 (1.6 m)   Wt 172 lb 3.2 oz (78.1 kg)   SpO2 92%   BMI 30.50 kg/m    Physical Exam Vitals reviewed.  Constitutional:      Appearance: She is well-developed. She is obese.  Cardiovascular:     Rate and Rhythm: Normal rate and regular rhythm.     Heart sounds: Normal heart sounds. No murmur heard. Pulmonary:     Effort: Pulmonary effort is normal.     Breath sounds: Normal breath sounds. No decreased breath sounds, wheezing or rhonchi.  Musculoskeletal:     Right lower leg: No edema.     Left lower leg: No edema.  Neurological:     Mental Status: She is alert and oriented to person, place, and time.  Psychiatric:        Mood and Affect: Mood normal.      No results found for any visits on 07/23/24.    The ASCVD Risk score (Arnett DK, et al., 2019) failed to calculate for the following reasons:   The 2019 ASCVD risk score is only valid for ages 88 to 20   * - Cholesterol units were assumed    Assessment & Plan:    Bronchitis  Acute respiratory failure with hypoxia (HCC)   Assessment and Plan    Acute respiratory failure with hypoxia Recent hospitalization for acute respiratory failure with hypoxia. Oxygen saturation improved to 92% on room air, but remains slightly low. Exertional desaturation to 89% noted. No evidence of pulmonary embolism or cardiac dysfunction on recent evaluations. Recovery ongoing, with expected gradual improvement in oxygenation. - Continue home oxygen therapy as needed, especially during exertion. - Use pulse oximeter to monitor oxygen saturation at home. - Administer nebulizer treatment if oxygen saturation drops below 88% or  persists low during rest. - Avoid strenuous activities such as cardio drumming class for another week or two. - Use inhalers as needed for respiratory symptoms.  Bronchitis Recent episode of bronchitis treated with steroids and antibiotics. Initial improvement followed by recurrence of symptoms. Likely viral etiology, possibly exacerbated by post-COVID immune system changes. Recovery ongoing, with expected gradual improvement. - Continue current medications as prescribed. - Monitor for recurrence of symptoms and report any worsening.  General Health Maintenance Routine health maintenance discussed, including kidney function and heart health. Recent labs show excellent kidney function and normal heart function. Weight management discussed, with emphasis on maintaining physical activity. - Continue regular physical activity and exercise classes. - Monitor weight and consider weight management strategies if needed. - Ensure regular follow-up appointments for routine health maintenance.     I personally spent a total of 35 minutes in the care of the patient today including preparing to see the patient, getting/reviewing separately obtained history, performing a medically  appropriate exam/evaluation, counseling and educating, documenting clinical information in the EHR, independently interpreting results, and communicating results.    No follow-ups on file.    Heron CHRISTELLA Sharper, MD "

## 2024-07-28 ENCOUNTER — Other Ambulatory Visit: Payer: Self-pay | Admitting: Family Medicine

## 2024-07-28 ENCOUNTER — Ambulatory Visit
Admission: RE | Admit: 2024-07-28 | Discharge: 2024-07-28 | Disposition: A | Source: Ambulatory Visit | Attending: Family Medicine

## 2024-07-28 DIAGNOSIS — R921 Mammographic calcification found on diagnostic imaging of breast: Secondary | ICD-10-CM

## 2024-07-28 DIAGNOSIS — R928 Other abnormal and inconclusive findings on diagnostic imaging of breast: Secondary | ICD-10-CM

## 2024-07-29 ENCOUNTER — Encounter

## 2024-07-31 ENCOUNTER — Ambulatory Visit: Payer: Self-pay

## 2024-07-31 NOTE — Telephone Encounter (Signed)
 Message from [place] H sent at 07/31/2024 10:05 AM EST  Summary: medical advise   Reason for Triage: Patient with lung issues would like to know if a kerosine heater would be a good idea for her during the anticipated weather

## 2024-08-04 ENCOUNTER — Ambulatory Visit: Payer: Self-pay | Admitting: Family Medicine

## 2024-08-04 NOTE — Telephone Encounter (Signed)
 I prefer she not use kerosene- there can be respiratory complications and if they are not used in a well ventilated area they can cause carbon monoxide buildup -- generally I would avoid using them indoors

## 2024-08-05 NOTE — Telephone Encounter (Signed)
 Patient informed of the message below.

## 2024-08-07 ENCOUNTER — Ambulatory Visit
Admission: RE | Admit: 2024-08-07 | Discharge: 2024-08-07 | Disposition: A | Discharge status: Home / Self Care | Source: Ambulatory Visit | Attending: Family Medicine | Admitting: Family Medicine

## 2024-08-07 DIAGNOSIS — R921 Mammographic calcification found on diagnostic imaging of breast: Secondary | ICD-10-CM

## 2024-08-08 ENCOUNTER — Encounter: Payer: Self-pay | Admitting: Family Medicine

## 2024-08-08 LAB — SURGICAL PATHOLOGY

## 2024-08-18 ENCOUNTER — Ambulatory Visit: Payer: PPO

## 2024-11-14 ENCOUNTER — Ambulatory Visit: Admitting: Family Medicine
